# Patient Record
Sex: Male | Born: 1965 | Race: Black or African American | Hispanic: No | Marital: Single | Smoking: Former smoker
Health system: Southern US, Community
[De-identification: ages and names within clinical notes are randomized; demographics above are authoritative.]

## PROBLEM LIST (undated history)

## (undated) DIAGNOSIS — F419 Anxiety disorder, unspecified: Secondary | ICD-10-CM

## (undated) DIAGNOSIS — F32A Depression, unspecified: Secondary | ICD-10-CM

## (undated) DIAGNOSIS — H543 Unqualified visual loss, both eyes: Secondary | ICD-10-CM

## (undated) DIAGNOSIS — J42 Unspecified chronic bronchitis: Secondary | ICD-10-CM

## (undated) DIAGNOSIS — E11319 Type 2 diabetes mellitus with unspecified diabetic retinopathy without macular edema: Secondary | ICD-10-CM

## (undated) DIAGNOSIS — G43909 Migraine, unspecified, not intractable, without status migrainosus: Secondary | ICD-10-CM

## (undated) DIAGNOSIS — Z8719 Personal history of other diseases of the digestive system: Secondary | ICD-10-CM

## (undated) DIAGNOSIS — M797 Fibromyalgia: Secondary | ICD-10-CM

## (undated) DIAGNOSIS — Z9289 Personal history of other medical treatment: Secondary | ICD-10-CM

## (undated) DIAGNOSIS — J9621 Acute and chronic respiratory failure with hypoxia: Secondary | ICD-10-CM

## (undated) DIAGNOSIS — E785 Hyperlipidemia, unspecified: Secondary | ICD-10-CM

## (undated) DIAGNOSIS — R6521 Severe sepsis with septic shock: Secondary | ICD-10-CM

## (undated) DIAGNOSIS — R296 Repeated falls: Secondary | ICD-10-CM

## (undated) DIAGNOSIS — R519 Headache, unspecified: Secondary | ICD-10-CM

## (undated) DIAGNOSIS — E109 Type 1 diabetes mellitus without complications: Secondary | ICD-10-CM

## (undated) DIAGNOSIS — N289 Disorder of kidney and ureter, unspecified: Secondary | ICD-10-CM

## (undated) DIAGNOSIS — I10 Essential (primary) hypertension: Secondary | ICD-10-CM

## (undated) DIAGNOSIS — R569 Unspecified convulsions: Secondary | ICD-10-CM

## (undated) DIAGNOSIS — R51 Headache: Secondary | ICD-10-CM

## (undated) DIAGNOSIS — J45909 Unspecified asthma, uncomplicated: Secondary | ICD-10-CM

## (undated) DIAGNOSIS — G629 Polyneuropathy, unspecified: Secondary | ICD-10-CM

## (undated) DIAGNOSIS — Z8711 Personal history of peptic ulcer disease: Secondary | ICD-10-CM

## (undated) DIAGNOSIS — G9341 Metabolic encephalopathy: Secondary | ICD-10-CM

## (undated) DIAGNOSIS — N189 Chronic kidney disease, unspecified: Secondary | ICD-10-CM

## (undated) DIAGNOSIS — K219 Gastro-esophageal reflux disease without esophagitis: Secondary | ICD-10-CM

## (undated) DIAGNOSIS — D649 Anemia, unspecified: Secondary | ICD-10-CM

## (undated) DIAGNOSIS — F329 Major depressive disorder, single episode, unspecified: Secondary | ICD-10-CM

## (undated) DIAGNOSIS — I469 Cardiac arrest, cause unspecified: Secondary | ICD-10-CM

## (undated) DIAGNOSIS — N186 End stage renal disease: Secondary | ICD-10-CM

## (undated) DIAGNOSIS — I82409 Acute embolism and thrombosis of unspecified deep veins of unspecified lower extremity: Secondary | ICD-10-CM

## (undated) HISTORY — PX: NO PAST SURGERIES: SHX2092

## (undated) HISTORY — DX: Hyperlipidemia, unspecified: E78.5

## (undated) HISTORY — DX: Gastro-esophageal reflux disease without esophagitis: K21.9

---

## 2015-03-01 ENCOUNTER — Emergency Department (HOSPITAL_COMMUNITY): Payer: Self-pay

## 2015-03-01 ENCOUNTER — Emergency Department (HOSPITAL_COMMUNITY)
Admission: EM | Admit: 2015-03-01 | Discharge: 2015-03-02 | Disposition: A | Discharge status: Home / Self Care | Payer: Self-pay | Attending: Emergency Medicine | Admitting: Emergency Medicine

## 2015-03-01 ENCOUNTER — Encounter (HOSPITAL_COMMUNITY): Payer: Self-pay | Admitting: Emergency Medicine

## 2015-03-01 DIAGNOSIS — R000 Tachycardia, unspecified: Secondary | ICD-10-CM | POA: Insufficient documentation

## 2015-03-01 DIAGNOSIS — Z880 Allergy status to penicillin: Secondary | ICD-10-CM | POA: Insufficient documentation

## 2015-03-01 DIAGNOSIS — I10 Essential (primary) hypertension: Secondary | ICD-10-CM | POA: Insufficient documentation

## 2015-03-01 DIAGNOSIS — M25562 Pain in left knee: Secondary | ICD-10-CM

## 2015-03-01 DIAGNOSIS — J45909 Unspecified asthma, uncomplicated: Secondary | ICD-10-CM | POA: Insufficient documentation

## 2015-03-01 DIAGNOSIS — Y998 Other external cause status: Secondary | ICD-10-CM | POA: Insufficient documentation

## 2015-03-01 DIAGNOSIS — W010XXA Fall on same level from slipping, tripping and stumbling without subsequent striking against object, initial encounter: Secondary | ICD-10-CM | POA: Insufficient documentation

## 2015-03-01 DIAGNOSIS — E1165 Type 2 diabetes mellitus with hyperglycemia: Secondary | ICD-10-CM | POA: Insufficient documentation

## 2015-03-01 DIAGNOSIS — S299XXA Unspecified injury of thorax, initial encounter: Secondary | ICD-10-CM | POA: Insufficient documentation

## 2015-03-01 DIAGNOSIS — S8992XA Unspecified injury of left lower leg, initial encounter: Secondary | ICD-10-CM | POA: Insufficient documentation

## 2015-03-01 DIAGNOSIS — Y9259 Other trade areas as the place of occurrence of the external cause: Secondary | ICD-10-CM | POA: Insufficient documentation

## 2015-03-01 DIAGNOSIS — W19XXXA Unspecified fall, initial encounter: Secondary | ICD-10-CM

## 2015-03-01 DIAGNOSIS — Y9389 Activity, other specified: Secondary | ICD-10-CM | POA: Insufficient documentation

## 2015-03-01 DIAGNOSIS — S8991XA Unspecified injury of right lower leg, initial encounter: Secondary | ICD-10-CM | POA: Insufficient documentation

## 2015-03-01 DIAGNOSIS — M25561 Pain in right knee: Secondary | ICD-10-CM

## 2015-03-01 DIAGNOSIS — H540 Blindness, both eyes: Secondary | ICD-10-CM | POA: Insufficient documentation

## 2015-03-01 DIAGNOSIS — R739 Hyperglycemia, unspecified: Secondary | ICD-10-CM

## 2015-03-01 HISTORY — DX: Essential (primary) hypertension: I10

## 2015-03-01 HISTORY — DX: Unspecified asthma, uncomplicated: J45.909

## 2015-03-01 LAB — CBC WITH DIFFERENTIAL/PLATELET
BASOS PCT: 0 % (ref 0–1)
Basophils Absolute: 0 10*3/uL (ref 0.0–0.1)
EOS ABS: 0.1 10*3/uL (ref 0.0–0.7)
Eosinophils Relative: 1 % (ref 0–5)
HEMATOCRIT: 35.4 % — AB (ref 39.0–52.0)
HEMOGLOBIN: 12.4 g/dL — AB (ref 13.0–17.0)
Lymphocytes Relative: 39 % (ref 12–46)
Lymphs Abs: 2.8 10*3/uL (ref 0.7–4.0)
MCH: 30.5 pg (ref 26.0–34.0)
MCHC: 35 g/dL (ref 30.0–36.0)
MCV: 87 fL (ref 78.0–100.0)
MONO ABS: 0.4 10*3/uL (ref 0.1–1.0)
MONOS PCT: 5 % (ref 3–12)
Neutro Abs: 3.9 10*3/uL (ref 1.7–7.7)
Neutrophils Relative %: 55 % (ref 43–77)
PLATELETS: 376 10*3/uL (ref 150–400)
RBC: 4.07 MIL/uL — AB (ref 4.22–5.81)
RDW: 12.5 % (ref 11.5–15.5)
WBC: 7.2 10*3/uL (ref 4.0–10.5)

## 2015-03-01 LAB — URINALYSIS, ROUTINE W REFLEX MICROSCOPIC
BILIRUBIN URINE: NEGATIVE
Glucose, UA: >1000 mg/dL — AB
KETONES UR: NEGATIVE mg/dL
Leukocytes, UA: NEGATIVE
Nitrite: NEGATIVE
SPECIFIC GRAVITY, URINE: 1.025 (ref 1.005–1.030)
Urobilinogen, UA: 0.2 mg/dL (ref 0.0–1.0)
pH: 6 (ref 5.0–8.0)

## 2015-03-01 LAB — CBG MONITORING, ED
GLUCOSE-CAPILLARY: 325 mg/dL — AB (ref 70–99)
Glucose-Capillary: 420 mg/dL — ABNORMAL HIGH (ref 70–99)
Glucose-Capillary: 385 mg/dL — ABNORMAL HIGH (ref 70–99)

## 2015-03-01 LAB — COMPREHENSIVE METABOLIC PANEL
ALT: 26 U/L (ref 0–53)
ANION GAP: 8 (ref 5–15)
AST: 31 U/L (ref 0–37)
Albumin: 2.8 g/dL — ABNORMAL LOW (ref 3.5–5.2)
Alkaline Phosphatase: 103 U/L (ref 39–117)
BILIRUBIN TOTAL: 0.5 mg/dL (ref 0.3–1.2)
BUN: 11 mg/dL (ref 6–23)
CO2: 26 mmol/L (ref 19–32)
Calcium: 8.8 mg/dL (ref 8.4–10.5)
Chloride: 95 mmol/L — ABNORMAL LOW (ref 96–112)
Creatinine, Ser: 1.32 mg/dL (ref 0.50–1.35)
GFR calc Af Amer: 72 mL/min — ABNORMAL LOW (ref >90)
GFR, EST NON AFRICAN AMERICAN: 62 mL/min — AB (ref >90)
Glucose, Bld: 445 mg/dL — ABNORMAL HIGH (ref 70–99)
Potassium: 4 mmol/L (ref 3.5–5.1)
Sodium: 129 mmol/L — ABNORMAL LOW (ref 135–145)
Total Protein: 6.6 g/dL (ref 6.0–8.3)

## 2015-03-01 LAB — I-STAT TROPONIN, ED: TROPONIN I, POC: 0 ng/mL (ref 0.00–0.08)

## 2015-03-01 LAB — URINE MICROSCOPIC-ADD ON

## 2015-03-01 MED ORDER — INSULIN ASPART 100 UNIT/ML ~~LOC~~ SOLN
5.0000 [IU] | Freq: Once | SUBCUTANEOUS | Status: AC
Start: 2015-03-01 — End: 2015-03-01
  Administered 2015-03-01: 5 [IU] via INTRAVENOUS
  Filled 2015-03-01: qty 1

## 2015-03-01 MED ORDER — ACETAMINOPHEN 325 MG PO TABS
650.0000 mg | ORAL_TABLET | Freq: Once | ORAL | Status: AC
  Administered 2015-03-01: 650 mg via ORAL
  Filled 2015-03-01: qty 2

## 2015-03-01 MED ORDER — SODIUM CHLORIDE 0.9 % IV SOLN
Freq: Once | INTRAVENOUS | Status: AC
Start: 2015-03-01 — End: 2015-03-02
  Administered 2015-03-01: 23:00:00 via INTRAVENOUS

## 2015-03-01 MED ORDER — SODIUM CHLORIDE 0.9 % IV BOLUS (SEPSIS)
1000.0000 mL | Freq: Once | INTRAVENOUS | Status: AC
  Administered 2015-03-01: 1000 mL via INTRAVENOUS

## 2015-03-01 NOTE — ED Provider Notes (Signed)
CSN: [id number]     Arrival date & time 03/01/15  1846 History   First MD Initiated Contact with Patient 03/01/15 2054     Chief Complaint  Patient presents with  . Fall  . Knee Pain     (Consider location/radiation/quality/duration/timing/severity/associated sxs/prior Treatment) HPI Comments: 49 yo M hx of DM, HTN, Asthma, Diabetic retinopathy, presents with CC of fall, knee pain.  Pt states he slipped at bathroom in [employer] and fell onto both knees.  Denies head trauma or LOC.  Pt has been ambulatory since then, but c/o of bilateral knee pain L > R.  Pt also states he has been lightheaded over the last three days, and his blood sugars have been running in the 700's (baseline 300-400).  He has come [facility] on Sunday as well, but this resolved on own within minutes.  Denies fever, chills, SOB, cough, abdominal pain, nausea, vomiting, diarrhea, rash, myalgias, or any other symptoms.  Pt has been compliant with home insulin therapy.  No sick contacts.  No other concerns.   The history is provided by the patient. No language interpreter was used.    Past Medical History  Diagnosis Date  . Hypertension   . Diabetes mellitus without complication   . Asthma   . Blind    History reviewed. No pertinent past surgical history. History reviewed. No pertinent family history. History  Substance Use Topics  . Smoking status: Never Smoker   . Smokeless tobacco: Not on file  . Alcohol Use: No    Review of Systems  Constitutional: Negative for fever and chills.  Respiratory: Negative for cough and shortness of breath.   Cardiovascular: Positive for chest pain. Negative for palpitations and leg swelling.  Gastrointestinal: Negative for nausea, vomiting, abdominal pain and diarrhea.  Genitourinary: Negative for dysuria.  Musculoskeletal: Positive for arthralgias. Negative for myalgias.  Skin: Negative for rash.  Neurological: Positive for light-headedness. Negative for dizziness,  weakness, numbness and headaches.  Hematological: Negative for adenopathy. Does not bruise/bleed easily.  All other systems reviewed and are negative.     Allergies  Penicillins  Home Medications   Prior to Admission medications   Not on File   BP 176/97 mmHg  Pulse 112  Temp(Src) 99.2 F (37.3 C) (Oral)  Resp 18  SpO2 99% Physical Exam  Constitutional: He is oriented to person, place, and time. He appears well-developed and well-nourished.  HENT:  Head: Normocephalic and atraumatic.  Right Ear: External ear normal.  Left Ear: External ear normal.  Mouth/Throat: Oropharynx is clear and moist.  Eyes: Conjunctivae and EOM are normal. Pupils are equal, round, and reactive to light.  Neck: Normal range of motion. Neck supple.  Cardiovascular: Regular rhythm, normal heart sounds and intact distal pulses.   Tachycardia   Pulmonary/Chest: Effort normal and breath sounds normal. No respiratory distress. He has no wheezes. He has no rales. He exhibits no tenderness.  Abdominal: Soft. Bowel sounds are normal. He exhibits no distension and no mass. There is no tenderness. There is no rebound and no guarding.  Musculoskeletal: Normal range of motion. He exhibits tenderness.  TTP bilateral knees, L > R.  Normal passive ROM of both knees, decreased active ROM 2/2 pain.  No deformity.  No overlying lacerations, but does have some abrasions over left knee.    Neurological: He is alert and oriented to person, place, and time.  Skin: Skin is warm and dry.  Nursing note and vitals reviewed.   ED Course  Procedures (including critical care time) Labs Review Labs Reviewed  CBC WITH DIFFERENTIAL/PLATELET - Abnormal; Notable for the following:    RBC 4.07 (*)    Hemoglobin 12.4 (*)    HCT 35.4 (*)    All other components within normal limits  COMPREHENSIVE METABOLIC PANEL - Abnormal; Notable for the following:    Sodium 129 (*)    Chloride 95 (*)    Glucose, Bld 445 (*)    Albumin 2.8  (*)    GFR calc non Af Amer 62 (*)    GFR calc Af Amer 72 (*)    All other components within normal limits  CBG MONITORING, ED - Abnormal; Notable for the following:    Glucose-Capillary 420 (*)    All other components within normal limits  CBG MONITORING, ED - Abnormal; Notable for the following:    Glucose-Capillary 385 (*)    All other components within normal limits  URINALYSIS, ROUTINE W REFLEX MICROSCOPIC  I-STAT TROPOININ, ED    Imaging Review Dg Chest 2 View  03/01/2015   CLINICAL DATA:  Intermittent chest pain since 02/26/2015. Post fall in bathroom at [place] earlier today.  EXAM: CHEST  2 VIEW  COMPARISON:  None.  FINDINGS: Normal cardiac silhouette and mediastinal contours given decreased lung volumes. Evaluation of the retrosternal clear space is obscured secondary to overlying osseous and soft tissue structures. Minimal bilateral infrahilar heterogeneous opacities favored to represent atelectasis. No discrete focal airspace opacities. No pleural effusion or pneumothorax. No evidence of edema. No acute osseus abnormalities.  IMPRESSION: Hypoventilated examination without acute cardiopulmonary disease.   Electronically Signed   By: Sandi Mariscal M.D.   On: 03/01/2015 22:03   Dg Knee Complete 4 Views Left  03/01/2015   CLINICAL DATA:  Patient fell today in the bathroom. Bilateral knee pain.  EXAM: LEFT KNEE - COMPLETE 4+ VIEW  COMPARISON:  None.  FINDINGS: Mild patella [place]. No evidence of acute fracture or dislocation in the left knee. 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 No significant effusion.  IMPRESSION: No acute bony abnormalities.   Electronically Signed   By: Lucienne Capers M.D.   On: 03/01/2015 22:07   Dg Knee Complete 4 Views Right  03/01/2015   CLINICAL DATA:  Post fall in bathroom today at [place], now with bilateral knee pain.  EXAM: RIGHT KNEE - COMPLETE 4+ VIEW  COMPARISON:  None.  FINDINGS: No fracture or dislocation. There is mild degenerative change of the  knee, worse within the medial compartment and patellar femoral joints with joint space loss subchondral sclerosis and osteophytosis. No evidence of chondrocalcinosis. No joint effusion. Regional soft tissues appear normal. No radiopaque foreign body.  IMPRESSION: 1. No acute findings. 2. Mild degenerative change of the knee.   Electronically Signed   By: Sandi Mariscal M.D.   On: 03/01/2015 22:05     EKG Interpretation   Date/Time:  Wednesday March 01 2015 21:07:51 EST Ventricular Rate:  108 PR Interval:  157 QRS Duration: 84 QT Interval:  346 QTC Calculation: 464 R Axis:   13 Text Interpretation:  Sinus tachycardia Abnormal R-wave progression, late  transition ST elevation, consider anterior injury Sinus tachycardia ST-t  wave abnormality Abnormal ekg Confirmed by Carmin Muskrat  MD [phone]) on  03/01/2015 9:13:15 PM      MDM   Final diagnoses:  Hyperglycemia  Knee pain, bilateral  Fall, initial encounter   49 yo M hx of DM, HTN, Asthma, Diabetic retinopathy, presents with CC of fall,  knee pain.   Physical exam as above.  Pt tachycardic, hypertensive, afebrile, satting well on RA.   CXR was unremarkable.  istat troponin WNL.  EKG with sinus tachycardia, poor R wave progression.    Bilateral knee XR demonstrate no acute fractures.   BG 420 on CBG.    Rest of labs demonstrate no leukocytosis, mild normocytic anemia with Hgb 12.4, CMP with NA 129, glucose 445, with normal bicarb and anion gap.  No ketones in urine, > 300 protein, > 1000 glucose.  Diagnosis of hyperglycemia, without DKA.  No concern for cardiac disease at this time as pt does not have c/o of chest pain on today's visit, and troponin is 0.00.  And XRs reveal no traumatic disease; thus only minor MSK trauma to bilateral knees.   Pt given IVF, with decrease in CBG to 385.  Will given another NS bolus, and 5U insulin, and reevaluate.  Repeat CBG 325.  Pt states BG is usually 300-400.  As pt is at baseline, okay for d/c  at this time.   D/c home in good condition.  Continue insulin for DM.  Can can OTC meds for minor knee trauma.  F/u with PCP in 1 week.  Return precautions given.  Pt understands and agrees with plan.   Discussed pt with my attending Dr. Vanita Panda.   Sinda Du       Sinda Du, MD 03/02/15 [id number]  Carmin Muskrat, MD 03/03/15 [phone]

## 2015-03-01 NOTE — ED Notes (Signed)
Pt reports slipping in the bathroom at [employer] and falling onto both knees. Pt did hit head; denies LOC; reports lightheadedness. Abrasion noted to L knee with mild swelling. Pt also reports intermittent on chest pain on Sunday associated with dizziness and weakness. Pains described as sharp shooting pains that was relief with rest. Pt also c/o hyperglycemia with readings in the 700s at home. Normal range 300-400.

## 2015-03-01 NOTE — ED Notes (Signed)
Pt c/o headache; Dr.Webb aware; no new orders at this time

## 2015-03-01 NOTE — ED Notes (Signed)
Pt c/o fall today and c/o bilateral knee pain after fall with some bruising

## 2015-03-02 NOTE — Discharge Instructions (Signed)
Arthralgia Your caregiver has diagnosed you as suffering from an arthralgia. Arthralgia means there is pain in a joint. This can come from many reasons including:  Bruising the joint which causes soreness (inflammation) in the joint.  Wear and tear on the joints which occur as we grow older (osteoarthritis).  Overusing the joint.  Various forms of arthritis.  Infections of the joint. Regardless of the cause of pain in your joint, most of these different pains respond to anti-inflammatory drugs and rest. The exception to this is when a joint is infected, and these cases are treated with antibiotics, if it is a bacterial infection. HOME CARE INSTRUCTIONS   Rest the injured area for as long as directed by your caregiver. Then slowly start using the joint as directed by your caregiver and as the pain allows. Crutches as directed may be useful if the ankles, knees or hips are involved. If the knee was splinted or casted, continue use and care as directed. If an stretchy or elastic wrapping bandage has been applied today, it should be removed and re-applied every 3 to 4 hours. It should not be applied tightly, but firmly enough to keep swelling down. Watch toes and feet for swelling, bluish discoloration, coldness, numbness or excessive pain. If any of these problems (symptoms) occur, remove the ace bandage and re-apply more loosely. If these symptoms persist, contact your caregiver or return to this location.  For the first 24 hours, keep the injured extremity elevated on pillows while lying down.  Apply ice for 15-20 minutes to the sore joint every couple hours while awake for the first half day. Then 03-04 times per day for the first 48 hours. Put the ice in a plastic bag and place a towel between the bag of ice and your skin.  Wear any splinting, casting, elastic bandage applications, or slings as instructed.  Only take over-the-counter or prescription medicines for pain, discomfort, or fever as  directed by your caregiver. Do not use aspirin immediately after the injury unless instructed by your physician. Aspirin can cause increased bleeding and bruising of the tissues.  If you were given crutches, continue to use them as instructed and do not resume weight bearing on the sore joint until instructed. Persistent pain and inability to use the sore joint as directed for more than 2 to 3 days are warning signs indicating that you should see a caregiver for a follow-up visit as soon as possible. Initially, a hairline fracture (break in bone) may not be evident on X-rays. Persistent pain and swelling indicate that further evaluation, non-weight bearing or use of the joint (use of crutches or slings as instructed), or further X-rays are indicated. X-rays may sometimes not show a small fracture until a week or 10 days later. Make a follow-up appointment with your own caregiver or one to whom we have referred you. A radiologist (specialist in reading X-rays) may read your X-rays. Make sure you know how you are to obtain your X-ray results. Do not assume everything is normal if you do not hear from [place]. SEEK MEDICAL CARE IF: Bruising, swelling, or pain increases. SEEK IMMEDIATE MEDICAL CARE IF:   Your fingers or toes are numb or blue.  The pain is not responding to medications and continues to stay the same or get worse.  The pain in your joint becomes severe.  You develop a fever over 102 F (38.9 C).  It becomes impossible to move or use the joint. MAKE SURE YOU:  Understand these instructions.  Will watch your condition.  Will get help right away if you are not doing well or get worse. Document Released: 12/09/2005 Document Revised: 03/02/2012 Document Reviewed: 07/27/2008 [facility] Patient Information 2015 [place], [place]. This information is not intended to replace advice given to you by your health care provider. Make sure you discuss any questions you have with your health care  provider.  Hyperglycemia Hyperglycemia occurs when the glucose (sugar) in your blood is too high. Hyperglycemia can happen for many reasons, but it most often happens to people who do not know they have diabetes or are not managing their diabetes properly.  CAUSES  Whether you have diabetes or not, there are other causes of hyperglycemia. Hyperglycemia can occur when you have diabetes, but it can also occur in other situations that you might not be as aware of, such as: Diabetes  If you have diabetes and are having problems controlling your blood glucose, hyperglycemia could occur because of some of the following reasons:  Not following your meal plan.  Not taking your diabetes medications or not taking it properly.  Exercising less or doing less activity than you normally do.  Being sick. Pre-diabetes  This cannot be ignored. Before people develop Type 2 diabetes, they almost always have "pre-diabetes." This is when your blood glucose levels are higher than normal, but not yet high enough to be diagnosed as diabetes. Research has shown that some long-term damage to the body, especially the heart and circulatory system, may already be occurring during pre-diabetes. If you take action to manage your blood glucose when you have pre-diabetes, you may delay or prevent Type 2 diabetes from developing. Stress  If you have diabetes, you may be "diet" controlled or on oral medications or insulin to control your diabetes. However, you may find that your blood glucose is higher than usual in the hospital whether you have diabetes or not. This is often referred to as "stress hyperglycemia." Stress can elevate your blood glucose. This happens because of hormones put out by the body during times of stress. If stress has been the cause of your high blood glucose, it can be followed regularly by your caregiver. That way he/she can make sure your hyperglycemia does not continue to get worse or progress to  diabetes. Steroids  Steroids are medications that act on the infection fighting system (immune system) to block inflammation or infection. One side effect can be a rise in blood glucose. Most people can produce enough extra insulin to allow for this rise, but for those who cannot, steroids make blood glucose levels go even higher. It is not unusual for steroid treatments to "uncover" diabetes that is developing. It is not always possible to determine if the hyperglycemia will go away after the steroids are stopped. A special blood test called an A1c is sometimes done to determine if your blood glucose was elevated before the steroids were started. SYMPTOMS  Thirsty.  Frequent urination.  Dry mouth.  Blurred vision.  Tired or fatigue.  Weakness.  Sleepy.  Tingling in feet or leg. DIAGNOSIS  Diagnosis is made by monitoring blood glucose in one or all of the following ways:  A1c test. This is a chemical found in your blood.  Fingerstick blood glucose monitoring.  Laboratory results. TREATMENT  First, knowing the cause of the hyperglycemia is important before the hyperglycemia can be treated. Treatment may include, but is not be limited to:  Education.  Change or adjustment in medications.  Change or adjustment in meal plan.  Treatment for an illness, infection, etc.  More frequent blood glucose monitoring.  Change in exercise plan.  Decreasing or stopping steroids.  Lifestyle changes. HOME CARE INSTRUCTIONS   Test your blood glucose as directed.  Exercise regularly. Your caregiver will give you instructions about exercise. Pre-diabetes or diabetes which comes on with stress is helped by exercising.  Eat wholesome, balanced meals. Eat often and at regular, fixed times. Your caregiver or nutritionist will give you a meal plan to guide your sugar intake.  Being at an ideal weight is important. If needed, losing as little as 10 to 15 pounds may help improve blood  glucose levels. SEEK MEDICAL CARE IF:   You have questions about medicine, activity, or diet.  You continue to have symptoms (problems such as increased thirst, urination, or weight gain). SEEK IMMEDIATE MEDICAL CARE IF:   You are vomiting or have diarrhea.  Your breath smells fruity.  You are breathing faster or slower.  You are very sleepy or incoherent.  You have numbness, tingling, or pain in your feet or hands.  You have chest pain.  Your symptoms get worse even though you have been following your caregiver's orders.  If you have any other questions or concerns. Document Released: 06/04/2001 Document Revised: 03/02/2012 Document Reviewed: 04/06/2012 [facility] Patient Information 2015 [place], [place]. This information is not intended to replace advice given to you by your health care provider. Make sure you discuss any questions you have with your health care provider.

## 2015-10-19 ENCOUNTER — Ambulatory Visit (INDEPENDENT_AMBULATORY_CARE_PROVIDER_SITE_OTHER): Payer: Medicaid Other | Admitting: Internal Medicine

## 2015-10-19 ENCOUNTER — Ambulatory Visit (HOSPITAL_COMMUNITY)
Admission: RE | Admit: 2015-10-19 | Discharge: 2015-10-19 | Disposition: A | Discharge status: Home / Self Care | Payer: Medicaid Other | Source: Ambulatory Visit | Attending: Internal Medicine | Admitting: Internal Medicine

## 2015-10-19 ENCOUNTER — Encounter: Payer: Self-pay | Admitting: Internal Medicine

## 2015-10-19 VITALS — BP 195/118 | HR 108 | Temp 98.2°F | Ht 73.0 in | Wt 242.8 lb

## 2015-10-19 DIAGNOSIS — E1165 Type 2 diabetes mellitus with hyperglycemia: Secondary | ICD-10-CM | POA: Diagnosis not present

## 2015-10-19 DIAGNOSIS — H5412 Blindness, left eye, low vision right eye: Secondary | ICD-10-CM | POA: Diagnosis not present

## 2015-10-19 DIAGNOSIS — K219 Gastro-esophageal reflux disease without esophagitis: Secondary | ICD-10-CM | POA: Diagnosis not present

## 2015-10-19 DIAGNOSIS — E1142 Type 2 diabetes mellitus with diabetic polyneuropathy: Secondary | ICD-10-CM

## 2015-10-19 DIAGNOSIS — Z794 Long term (current) use of insulin: Secondary | ICD-10-CM

## 2015-10-19 DIAGNOSIS — E11319 Type 2 diabetes mellitus with unspecified diabetic retinopathy without macular edema: Secondary | ICD-10-CM

## 2015-10-19 DIAGNOSIS — Z8719 Personal history of other diseases of the digestive system: Secondary | ICD-10-CM | POA: Diagnosis not present

## 2015-10-19 DIAGNOSIS — R000 Tachycardia, unspecified: Secondary | ICD-10-CM | POA: Insufficient documentation

## 2015-10-19 DIAGNOSIS — Z0000 Encounter for general adult medical examination without abnormal findings: Secondary | ICD-10-CM | POA: Insufficient documentation

## 2015-10-19 DIAGNOSIS — IMO0002 Reserved for concepts with insufficient information to code with codable children: Secondary | ICD-10-CM | POA: Insufficient documentation

## 2015-10-19 DIAGNOSIS — R079 Chest pain, unspecified: Secondary | ICD-10-CM

## 2015-10-19 DIAGNOSIS — I10 Essential (primary) hypertension: Secondary | ICD-10-CM | POA: Diagnosis present

## 2015-10-19 DIAGNOSIS — Z7951 Long term (current) use of inhaled steroids: Secondary | ICD-10-CM | POA: Diagnosis not present

## 2015-10-19 DIAGNOSIS — J4540 Moderate persistent asthma, uncomplicated: Secondary | ICD-10-CM | POA: Diagnosis not present

## 2015-10-19 DIAGNOSIS — R9431 Abnormal electrocardiogram [ECG] [EKG]: Secondary | ICD-10-CM | POA: Diagnosis not present

## 2015-10-19 LAB — GLUCOSE, CAPILLARY: Glucose-Capillary: 474 mg/dL — ABNORMAL HIGH (ref 65–99)

## 2015-10-19 LAB — POCT GLYCOSYLATED HEMOGLOBIN (HGB A1C): Hemoglobin A1C: 11

## 2015-10-19 MED ORDER — ACCU-CHEK NANO SMARTVIEW W/DEVICE KIT: PACK | Status: DC

## 2015-10-19 MED ORDER — ACCU-CHEK FASTCLIX LANCETS MISC
Status: DC
Start: 2015-10-19 — End: 2016-03-14

## 2015-10-19 MED ORDER — CARVEDILOL 3.125 MG PO TABS: 3.1250 mg | ORAL_TABLET | Freq: Two times a day (BID) | ORAL | Status: DC

## 2015-10-19 MED ORDER — ATORVASTATIN CALCIUM 40 MG PO TABS: 40.0000 mg | ORAL_TABLET | Freq: Every day | ORAL | Status: DC

## 2015-10-19 MED ORDER — "INSULIN SYRINGE-NEEDLE U-100 31G X 15/64"" 0.5 ML MISC": Status: DC

## 2015-10-19 MED ORDER — BECLOMETHASONE DIPROPIONATE 80 MCG/ACT IN AERS: 2.0000 | INHALATION_SPRAY | Freq: Two times a day (BID) | RESPIRATORY_TRACT | Status: AC

## 2015-10-19 MED ORDER — PANTOPRAZOLE SODIUM 40 MG PO PACK: 40.0000 mg | PACK | Freq: Every day | ORAL | Status: DC

## 2015-10-19 MED ORDER — GABAPENTIN 100 MG PO CAPS: 100.0000 mg | ORAL_CAPSULE | Freq: Three times a day (TID) | ORAL | Status: DC

## 2015-10-19 MED ORDER — INSULIN LISPRO 100 UNIT/ML CARTRIDGE: 5.0000 [IU] | Freq: Three times a day (TID) | SUBCUTANEOUS | Status: DC

## 2015-10-19 MED ORDER — GLUCOSE BLOOD VI STRP: ORAL_STRIP | Status: DC

## 2015-10-19 MED ORDER — INSULIN GLARGINE 100 UNIT/ML ~~LOC~~ SOLN: 30.0000 [IU] | Freq: Every day | SUBCUTANEOUS | Status: DC

## 2015-10-19 MED ORDER — LISINOPRIL 20 MG PO TABS: 20.0000 mg | ORAL_TABLET | Freq: Every day | ORAL | Status: DC

## 2015-10-19 NOTE — Assessment & Plan Note (Addendum)
His chest pain is worrisome for ischemic chest pain. He has multiple risk factors including uncontrolled HTN and uncontrolled DM. He likely has hyperlipidemia as well. An EKG was checked in office today and showed T wave inversions in I, II, avL, and V4-V6 which is new compared to his EKG in March 2016. He is currently not having active chest pain. He will likely need a Myoview and cardiology referral in the near future. - Continue ASA 81 mg daily - Start Atorvastatin 40 mg daily. Check lipid panel. - Start Coreg 3.125 mg BID, titrate up as tolerated - Start Lisinopril 20 mg daily - Instructed to go to ED if chest pain worsens or becomes prolonged - Would recommend cardiology referral at next visit

## 2015-10-19 NOTE — Assessment & Plan Note (Addendum)
BP Readings from Last 3 Encounters:  10/19/15 195/118  03/02/15 179/112    Lab Results  Component Value Date   NA 129* 03/01/2015   K 4.0 03/01/2015   CREATININE 1.32 03/01/2015    Assessment: Blood pressure control:  BP significantly elevated today. Currently not on medications.  Plan: Medications:  Start Lisinopril 20 mg daily and Coreg 3.125 mg BID.  Other plans:  - BP recheck in 1 week - Check CMET

## 2015-10-19 NOTE — Progress Notes (Signed)
   Subjective:    Patient ID: Jeremy Yu, male    DOB: March 29, 1966, 49 y.o.   MRN: [id number]  HPI Mr. 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  HTN: BP significantly elevated at 199/119 with HR 108. On repeat check his BP was 195/118. He reports at the free clinic he used to go to he was supposed to be on medication but has not been for last several months. He does not remember the names of the medications he was previously prescribed.  Type 2 DM: Last HbA1c unknown, today was 11.0. He reports his insulin regimen includes Novolog 70/30 15 units TID with meals and Lantus 10 units BID. He did not bring a glucometer as his is broken. He reports having peripheral neuropathy in his legs and hands. He notes significant burning pain. He is not on medication for his neuropathy. He has a history of retinopathy with loss of vision in his left eye and decreased vision on the right. He reports he has an appointment with ophthalmology next month.   "Heart Flutter": Patient reports for the last few months he has experienced a "heart flutter." He describes the sensation occurring mostly at night daily and occasionally during the day if he becomes emotionally upset/stressed. He describes associated chest pain that will last about 15 minutes before resolving. He describes the chest pain as sharp and pressure-like, located on the left side and sometimes radiates to the middle and right side, and worse upon exertion such as walking. He states the pain will get mildly better at night if he rubs his chest with his hand. He reports associated dyspnea, nausea/vomiting, and diaphoresis when the pain occurs. He reports he started to take a baby aspirin recently.   Asthma: Patient reports having to use his albuterol inhaler daily due to symptoms of chest tightness, shortness of breath, and wheezing. He  reports nocturnal symptoms sometimes. He has not had any hospitalizations for acute asthma exacerbations in many years.   GERD: He reports burning chest pain after eating. He has been avoiding spicy foods. He is currently not on any medications for his reflux.   Melena: Patient reports about 3 months ago he had 3-4 episodes of black tarry stools. He denies any additional episodes since then. He denies seeking medical attention. He has never had a colonoscopy. He denies excessive NSAID use or alcohol use.    Review of Systems General: Denies fever, chills, night sweats, changes in weight, changes in appetite HEENT: Reports headaches occasionally- has hx of migraines when younger. Denies ear pain, changes in vision, rhinorrhea, sore throat CV: See HPI Pulm: See HPI GI: Reports constipation. Denies diarrhea. GU: Denies dysuria, hematuria, frequency Msk: Denies muscle cramps, joint pains Neuro: Denies weakness Skin: Denies rashes, bruising Psych: Denies depression, anxiety, hallucinations    Objective:   Physical Exam General: alert, sitting up, NAD HEENT: [place]/AT, blind in L eye, sclera anicteric, pharynx non-erythematous, mucus membranes moist CV: RRR, no m/g/r Pulm: CTA bilaterally, breaths non-labored Abd: BS+, soft, obese, non-tender Ext: warm, trace-1+ pitting edema bilaterally Neuro: alert and oriented x 3. Strength 5/5 in upper and lower extremities. Decreased sensation to pinprick in lower extremities half-way up to knees.     Assessment & Plan:  Please refer to A&P documentation.

## 2015-10-19 NOTE — Assessment & Plan Note (Addendum)
Last episode of melena 3-4 months ago. Gave patient stool cards to take home and bring back to his next appointment. Instructions given. - f/u stool cards - Check CBC

## 2015-10-19 NOTE — Assessment & Plan Note (Addendum)
Symptoms most consistent with moderate persistent asthma. No PFTs in system. He is only on Albuterol PRN. Will start an inhaled glucocorticoid. - Start Qvar 2 puffs BID - Continue Albuterol PRN - Consider PFTs in future

## 2015-10-19 NOTE — Assessment & Plan Note (Signed)
-   Start Protonix 40 mg daily  - Evaluate symptoms at next visit

## 2015-10-19 NOTE — Assessment & Plan Note (Signed)
-   Check HIV antibody - Discuss vaccines next visit

## 2015-10-19 NOTE — Patient Instructions (Addendum)
-   Change insulin regimen to: Lantus 30 units at bedtime Humalog 5 units three times daily with meals  - Start Gabapentin 100 mg three times daily for your peripheral neuropathy  - Start Lisinopril 20 mg daily for blood pressure  - Start Protonix 40 mg daily for acid reflux  - Start Coreg 3.125 mg twice daily and Atorvastatin 40 mg daily for your heart. Continue aspirin 81 mg daily.  - Start using Qvar inhaler 2 puffs twice daily  - I have ordered a glucometer for you- Accu chek with test strips, lancets, and syringes.  - Will have you meet with Butch Penny, our diabetes educator and nutritionist  - Follow up in 2 weeks  General Instructions:   Please bring your medicines with you each time you come to clinic.  Medicines may include prescription medications, over-the-counter medications, herbal remedies, eye drops, vitamins, or other pills.   Progress Toward Treatment Goals:  No flowsheet data found.  Self Care Goals & Plans:  Self Care Goal 10/19/2015  Manage my medications take my medicines as prescribed; bring my medications to every visit; refill my medications on time  Monitor my health keep track of my blood glucose; bring my glucose meter and log to each visit; keep track of my blood pressure; check my feet daily  Eat healthy foods eat more vegetables; eat foods that are low in salt; eat baked foods instead of fried foods; eat smaller portions  Be physically active find an activity I enjoy  Prevent falls wear appropriate shoes; have my vision checked    No flowsheet data found.   Care Management & Community Referrals:  No flowsheet data found.

## 2015-10-19 NOTE — Assessment & Plan Note (Signed)
Lab Results  Component Value Date   HGBA1C 11.0 10/19/2015     Assessment: Diabetes control:  Uncontrolled. Unknown previous HbA1c. 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 Additionally, he was on an inappropriate insulin regimen. Will adjust his insulin regimen and have him follow up soon.    Plan: Medications:  Stop 70/30 completely. Change Lantus to 30 units QHS. Start Humalog 5 units TID with meals. Will likely need to be increased.  Home glucose monitoring: Frequency:  3-4 times daily  Timing:  Meal times and bedtime Instruction/counseling given: reminded to get eye exam, reminded to bring blood glucose meter & log to each visit and reminded to bring medications to each visit Other plans:  - Follow up in 1 week to adjust insulin regimen - Given Rxs for Accu chek glucometer, test strips, and lancets. Instructed to test 3-4 times daily and to bring meter to next visit. - Given Rx for Lantus and Humalog. - Check urine microalbumin today - Start Lisinopril- likely has nephropathy - Referral to Diabetes/Nutrition education - Start Atorvastatin 40 mg daily (high intensity statin)

## 2015-10-20 ENCOUNTER — Telehealth: Payer: Self-pay | Admitting: Pulmonary Disease

## 2015-10-20 ENCOUNTER — Other Ambulatory Visit: Payer: Self-pay | Admitting: Internal Medicine

## 2015-10-20 ENCOUNTER — Telehealth: Payer: Self-pay

## 2015-10-20 DIAGNOSIS — K219 Gastro-esophageal reflux disease without esophagitis: Secondary | ICD-10-CM

## 2015-10-20 LAB — MICROALBUMIN / CREATININE URINE RATIO
Creatinine, Urine: 49.7 mg/dL
MICROALB/CREAT RATIO: 7526.4 mg/g creat — ABNORMAL HIGH (ref 0.0–30.0)
Microalbumin, Urine: 3740.6 ug/mL

## 2015-10-20 LAB — CMP14 + ANION GAP
A/G RATIO: 1.1 (ref 1.1–2.5)
ALBUMIN: 3.4 g/dL — AB (ref 3.5–5.5)
ALK PHOS: 119 IU/L — AB (ref 39–117)
ALT: 11 IU/L (ref 0–44)
AST: 16 IU/L (ref 0–40)
Anion Gap: 17 mmol/L (ref 10.0–18.0)
BILIRUBIN TOTAL: 0.4 mg/dL (ref 0.0–1.2)
BUN / CREAT RATIO: 9 (ref 9–20)
BUN: 16 mg/dL (ref 6–24)
CHLORIDE: 95 mmol/L — AB (ref 97–106)
CO2: 23 mmol/L (ref 18–29)
Calcium: 9 mg/dL (ref 8.7–10.2)
Creatinine, Ser: 1.77 mg/dL — ABNORMAL HIGH (ref 0.76–1.27)
GFR calc Af Amer: 51 mL/min/{1.73_m2} — ABNORMAL LOW (ref >59)
GFR calc non Af Amer: 44 mL/min/{1.73_m2} — ABNORMAL LOW (ref >59)
GLOBULIN, TOTAL: 3 g/dL (ref 1.5–4.5)
Glucose: 429 mg/dL — ABNORMAL HIGH (ref 65–99)
POTASSIUM: 4.1 mmol/L (ref 3.5–5.2)
SODIUM: 135 mmol/L — AB (ref 136–144)
Total Protein: 6.4 g/dL (ref 6.0–8.5)

## 2015-10-20 LAB — LIPID PANEL
CHOLESTEROL TOTAL: 484 mg/dL — AB (ref 100–199)
Chol/HDL Ratio: 8.3 ratio units — ABNORMAL HIGH (ref 0.0–5.0)
HDL: 58 mg/dL (ref >39)
TRIGLYCERIDES: 986 mg/dL — AB (ref 0–149)

## 2015-10-20 LAB — CBC WITH DIFFERENTIAL/PLATELET
BASOS: 0 %
Basophils Absolute: 0 10*3/uL (ref 0.0–0.2)
EOS (ABSOLUTE): 0.1 10*3/uL (ref 0.0–0.4)
EOS: 1 %
HEMOGLOBIN: 11.2 g/dL — AB (ref 12.6–17.7)
Hematocrit: 35.1 % — ABNORMAL LOW (ref 37.5–51.0)
IMMATURE GRANS (ABS): 0 10*3/uL (ref 0.0–0.1)
IMMATURE GRANULOCYTES: 0 %
LYMPHS: 38 %
Lymphocytes Absolute: 2.8 10*3/uL (ref 0.7–3.1)
MCH: 29.5 pg (ref 26.6–33.0)
MCHC: 31.9 g/dL (ref 31.5–35.7)
MCV: 92 fL (ref 79–97)
MONOCYTES: 6 %
Monocytes Absolute: 0.4 10*3/uL (ref 0.1–0.9)
NEUTROS PCT: 55 %
Neutrophils Absolute: 3.9 10*3/uL (ref 1.4–7.0)
PLATELETS: 406 10*3/uL — AB (ref 150–379)
RBC: 3.8 x10E6/uL — ABNORMAL LOW (ref 4.14–5.80)
RDW: 13.1 % (ref 12.3–15.4)
WBC: 7.2 10*3/uL (ref 3.4–10.8)

## 2015-10-20 LAB — HIV ANTIBODY (ROUTINE TESTING W REFLEX): HIV Screen 4th Generation wRfx: NONREACTIVE

## 2015-10-20 MED ORDER — PANTOPRAZOLE SODIUM 40 MG PO TBEC: 40.0000 mg | DELAYED_RELEASE_TABLET | Freq: Every day | ORAL | Status: DC

## 2015-10-20 NOTE — Telephone Encounter (Signed)
INTERNAL MEDICINE RESIDENCY PROGRAM After-Hours Telephone Call    Reason for call:   I received a call from [name] on 10/20/2015 at 1:04 AM indicating he was having trouble with getting his new meter to work.    Pertinent Data:   Denies symptomatic hypoglycemia - no diaphoresis or tremulousness. Denies polyuria or polydipsia.    Assessment / Plan / Recommendations:   Will send message to front desk and Debera Lat, CDE to call him tomorrow to troubleshoot meter.  As always, pt is advised that if symptoms worsen or new symptoms arise, they should go to an urgent care facility or to to ER for further evaluation.    Milagros Loll, MD   10/20/2015, 1:04 AM

## 2015-10-20 NOTE — Telephone Encounter (Signed)
Message left on home phone ID recording - D. Plyler is not in clinic 10/20/15 - just wanted pt to be aware.

## 2015-10-20 NOTE — Progress Notes (Signed)
Sorry put in the wrong order! Changed to tablet form.

## 2015-10-20 NOTE — Telephone Encounter (Signed)
Pharmacy requesting update to protonix rx.  Per pharmacy it needs to be a tablet or powder instead of liquid, and pt told pharmacy he has no feeding tube and can tolerate the tablet form.

## 2015-10-20 NOTE — Progress Notes (Signed)
Medicine attending: Medical history, presenting problems, physical findings, and medications, reviewed with Dr Carly Rivet on the day of the patient visit and I concur with her evaluation and management plan. 

## 2015-10-23 ENCOUNTER — Telehealth: Payer: Self-pay | Admitting: *Deleted

## 2015-10-23 NOTE — Telephone Encounter (Signed)
His aide got his meter working. He says the last result he got was a blood sugar of 329 yesterday. He has not checked it today yet.  We discussed that anytime it is over 200 mg/dl that he should think about why his blood sugar is high and if he knows that he forgot his insulin, exercised less or ate too much that he can fix those things. However, if it is high on several readings and he doesn't know why he should clal the office. He says "it's always high" . Noted we have an appointment this Thursday, he agrees to bring his meter and we can discuss further at that time.

## 2015-10-23 NOTE — Telephone Encounter (Signed)
Filled today, need PA

## 2015-10-25 NOTE — Telephone Encounter (Signed)
Called made to [facility] after receiving PA request from triage.  Protonix is the preferred PPI for medicaid, pharmacy stated that patient had already picked up rx and no PA was required. Attempted to contact pt to confirm, no answer, no message left.  Will f/u with pt during his visit tomorrow in [facility], phone call complete.Despina Hidden Cassady11/2/201612:25 PM

## 2015-10-26 ENCOUNTER — Encounter: Payer: Medicaid Other | Admitting: Dietician

## 2015-10-26 ENCOUNTER — Ambulatory Visit: Payer: Medicaid Other | Admitting: Internal Medicine

## 2015-10-31 ENCOUNTER — Other Ambulatory Visit (INDEPENDENT_AMBULATORY_CARE_PROVIDER_SITE_OTHER): Payer: Medicaid Other

## 2015-10-31 DIAGNOSIS — Z8719 Personal history of other diseases of the digestive system: Secondary | ICD-10-CM

## 2015-10-31 LAB — POC HEMOCCULT BLD/STL (HOME/3-CARD/SCREEN)
Card #2 Fecal Occult Blod, POC: NEGATIVE
Card #3 Fecal Occult Blood, POC: NEGATIVE
Fecal Occult Blood, POC: NEGATIVE

## 2015-11-02 ENCOUNTER — Encounter: Payer: Self-pay | Admitting: Dietician

## 2015-11-02 ENCOUNTER — Ambulatory Visit (INDEPENDENT_AMBULATORY_CARE_PROVIDER_SITE_OTHER): Payer: Medicaid Other | Admitting: Dietician

## 2015-11-02 ENCOUNTER — Ambulatory Visit (INDEPENDENT_AMBULATORY_CARE_PROVIDER_SITE_OTHER): Payer: Medicaid Other | Admitting: Internal Medicine

## 2015-11-02 ENCOUNTER — Encounter: Payer: Self-pay | Admitting: Internal Medicine

## 2015-11-02 VITALS — BP 207/111 | HR 94 | Temp 97.8°F | Ht 73.0 in | Wt 246.0 lb

## 2015-11-02 DIAGNOSIS — Z0000 Encounter for general adult medical examination without abnormal findings: Secondary | ICD-10-CM

## 2015-11-02 DIAGNOSIS — E11319 Type 2 diabetes mellitus with unspecified diabetic retinopathy without macular edema: Secondary | ICD-10-CM | POA: Diagnosis not present

## 2015-11-02 DIAGNOSIS — Z8719 Personal history of other diseases of the digestive system: Secondary | ICD-10-CM | POA: Diagnosis not present

## 2015-11-02 DIAGNOSIS — I10 Essential (primary) hypertension: Secondary | ICD-10-CM | POA: Diagnosis not present

## 2015-11-02 DIAGNOSIS — R079 Chest pain, unspecified: Secondary | ICD-10-CM | POA: Diagnosis not present

## 2015-11-02 DIAGNOSIS — Z794 Long term (current) use of insulin: Secondary | ICD-10-CM

## 2015-11-02 DIAGNOSIS — E1065 Type 1 diabetes mellitus with hyperglycemia: Secondary | ICD-10-CM | POA: Diagnosis not present

## 2015-11-02 DIAGNOSIS — E1165 Type 2 diabetes mellitus with hyperglycemia: Secondary | ICD-10-CM | POA: Diagnosis not present

## 2015-11-02 DIAGNOSIS — Z713 Dietary counseling and surveillance: Secondary | ICD-10-CM

## 2015-11-02 MED ORDER — INSULIN ASPART 100 UNIT/ML FLEXPEN: 6.0000 [IU] | PEN_INJECTOR | Freq: Three times a day (TID) | SUBCUTANEOUS | Status: DC

## 2015-11-02 MED ORDER — INSULIN PEN NEEDLE 31G X 5 MM MISC: Status: DC

## 2015-11-02 MED ORDER — CARVEDILOL 6.25 MG PO TABS: 6.2500 mg | ORAL_TABLET | Freq: Two times a day (BID) | ORAL | Status: DC

## 2015-11-02 MED ORDER — GABAPENTIN 300 MG PO CAPS: 300.0000 mg | ORAL_CAPSULE | Freq: Three times a day (TID) | ORAL | Status: DC

## 2015-11-02 MED ORDER — INSULIN GLARGINE 100 UNIT/ML SOLOSTAR PEN: 30.0000 [IU] | PEN_INJECTOR | Freq: Every day | SUBCUTANEOUS | Status: DC

## 2015-11-02 MED ORDER — HYDROCHLOROTHIAZIDE 25 MG PO TABS: 25.0000 mg | ORAL_TABLET | Freq: Every day | ORAL | Status: DC

## 2015-11-02 NOTE — Assessment & Plan Note (Signed)
HIV was negative at last visit. -Flu shot next visit

## 2015-11-02 NOTE — Assessment & Plan Note (Signed)
Lab Results  Component Value Date   HGBA1C 11.0 10/19/2015     Assessment: Diabetes control:  Uncontrolled. Patient was off insulin for 'about a year', with several recently hospitalizations including a 2 week stint in the ICU for likely HHS Comments: Was only able to obtain lantus 30u qhs and is compliant. Not on any basal 2/2 humalog not being covered. Pt saw Butch Penny today as well for further education.  Plan: Medications:  Continue lantus solostar 30u qhs, start novolog flexpen 6u TID w meals. Home glucose monitoring: Frequency:  3-4 times daily Timing:  meals and bedtime Instruction/counseling given: reminded to get eye exam, reminded to bring blood glucose meter & log to each visit, reminded to bring medications to each visit, discussed foot care and discussed diet Educational resources provided:  Met with Butch Penny today Other plans:   -Will RTC in 2 weeks for adjustment of insulin regimen  -Continue lisinopril and atorvastatin  -Increased gabapentin from 113m TID to 304mTID for severe diabetic neuropathy  -Continued DM/Nutrition education, foot care  -F/u recs from eye appt next week

## 2015-11-02 NOTE — Assessment & Plan Note (Signed)
Chest pain is slightly improved since last visit, but still with history and EKG changes as well as PND and leg swelling is highly suggestive of ischemic-related heart disease. In the setting of very high BPs and sugars, this can be much worse. He is currently not having active chest pain -Increased coreg to 6.$RemoveBe'5mg'BKMKPgREz$  BID, will continue to titrate up as tolerated -Continue lisinopril, statin, ASA -Will consider cardiology referral soon with improved BP and glycemic control

## 2015-11-02 NOTE — Progress Notes (Signed)
Has not been taking the humalog medicaid denied it.  Misses lantus 1/7 days a week Bad days he won't eat- if he eats it makes him feel sick Bowels moving terribly- 4 days with no movement  Desires readings in 80-150  476 at 12 midnight then he took his night time insulin 370 this am,  Lowest since he got his meter was 279, highest is 500s. Did not bring meter with him today Tearful when discussing burden of diabetes. He reports memory problems. Has aid 7 days a week who also has diabetes and was in the waiting room today. CDE spoke to her and asked her to check his feet daily, assist him with meal planning.   He was diagnosed with Type 1  diabetes in his late 74s ~ 69.  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 He is currently going to school for the blind. He has been blind for 2 years His previous diabetes regimen was  - 10 novolin 70/30 3 times a day, lantus 15 units twice a day, another insulin to take when sugar was high and a  Pill before he eats  No symptoms if CBG < 700, no problems with low blood sugar Felt bad when >750 has been up to 1800 Doctor was in [place]- Dr. Elliot Gault because he doesn't eat carbs, bread, subs, little fried foods and diabetes is still not well controlled  Walks a lot.   24-hr recall suggests intake of 1800-2200 kcal:  (Up at 10  AM) B (  1130 AM)- used to skip, eats it now because his medicine calls for food- mainly eats a bowl of regular grits-3x/week, eggs or honeycomb cereal 1x/week, coffee and toast  x1 time    L (  3 PM)-skips if he eats breakfast, but will have diet soda and saltine wheat no salt crackers , sandwich x 2 times/week- subway cold cut 6" lettuce tomato, onions peppers, light vinegar oil light honey mustard.    Supper ( 7 PM)- potato- creamed instant, chicken- baked, and corn, not much pasta, loves chicken alfredo- none in 2-3 months buys small servings frozen, chinese food  2x/week- broccoli and chicken and sometimes egg rolls, steamed rice, eats 1/3 the box cup  Snk ( PM)- mini bag of hot fries or not even a whole small bag of chips Typical day? Yes.    Eats apples, grapes 10-15, bananas, lite fruit - 3 times a day Vegetables- daily Whole grains- crackers, sometimes rice, Diabetes Self-Management Education  Visit Type: First/Initial  Appt. Start Time: 0955 Appt. End Time: 9476  11/02/2015  Jeremy Yu, identified by name and date of birth, is a 49 y.o. male with a diagnosis of Diabetes: Type 1.   ASSESSMENT  There were no vitals taken for this visit. There is no weight on file to calculate BMI.      Diabetes Self-Management Education - 11/02/15 1600    Visit Information   Visit Type First/Initial   Initial Visit   Diabetes Type Type 1   Are you currently following a meal plan? Yes   What type of meal plan do you follow? --  tries to limit carbs and fried food   Are you taking your medications as prescribed? Yes   Date Diagnosed --  [place]   How would you rate your overall health? Fair   Psychosocial Assessment   Patient Belief/Attitude about Diabetes Defeat/Burnout  Self-care barriers Impaired vision;Lack of transportation;Lack of material resources   Self-management support Doctor's office;CDE visits   Patient Concerns Glycemic Control;Support   Special Needs Large print;Verbal instruction   Preferred Learning Style Auditory;Visual;Hands on   Learning Readiness Ready   How often do you need to have someone help you when you read instructions, pamphlets, or other written materials from your doctor or pharmacy? 4 - Often   Complications   Last HgB A1C per patient/outside source 11 %   How often do you check your blood sugar? 3-4 times / week   Fasting Blood glucose range (mg/dL) >200   Postprandial Blood glucose range (mg/dL) >200   Number of hypoglycemic episodes per month 0   Number of hyperglycemic episodes  per week 100   Can you tell when your blood sugar is high? No   Have you had a dilated eye exam in the past 12 months? No   Are you checking your feet? No   Exercise   Exercise Type Light (walking / raking leaves)   How many days per week to you exercise? --  5   Patient Education   Previous Diabetes Education Yes (please comment)   Nutrition management  Carbohydrate counting;Meal timing in regards to the patients' current diabetes medication.   Medications Reviewed patients medication for diabetes, action, purpose, timing of dose and side effects.   Monitoring Purpose and frequency of SMBG.;Daily foot exams   Individualized Goals (developed by patient)   Nutrition Follow meal plan discussed   Monitoring  Other (comment)   Outcomes   Expected Outcomes Demonstrated interest in learning. Expect positive outcomes   Future DMSE 2 wks   Program Status Not Completed      Individualized Plan for Diabetes Self-Management Training:   Learning Objective:  Patient will have a greater understanding of diabetes self-management. Patient education plan is : meal planning, acute complications, medication   Plan:   Patient Instructions  You are doing great taking care of your diabetes.  We just need to match your fast acting insulin to your food.   Call Jeremy Yu when any concerns about your diabetes.  [phone]  Carbs per day: 17-19  6 servings of carb at breakfast, lunch and supper  Meal plan:  Breakfast: 6 servings of carb Lunch: 6 servings of carb Supper: 6 servings of carb  1(one)Carb choice is equal to: 1/3 cup rice, 1 slice bread, 1/2 cup potato, 5 crackers, 1/2 cup fruit, 3/4 cup cold cereal, 1/2 grits, 1 cup milk,   Fast acting insulin is:______________  Long acting insulin is: Lantus   Expected Outcomes:  Demonstrated interest in learning. Expect positive outcomes Education material provided: Carbohydrate counting sheet If problems or questions, patient to contact team  via:  Phone Future DSME appointment: 2 wks

## 2015-11-02 NOTE — Progress Notes (Signed)
   Patient ID: Jeremy Yu male   DOB: 10/08/1966 49 y.o.   MRN: [id number]  Subjective:   HPI: [name] is a 49 y.o. 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  Since he was seen almost 2 weeks ago, he states he has been compliant with his medications with the assistance of his live-in home health aid. He was unable to get the humalog filled because it wasn't covered, but otherwise demonstrates proper use of the lantus. He says his BP has still been near 037 systolic on the lisinopril $RemoveBefore'20mg'djOYOdKpXDnlw$ . His sugars have been in the 300s and 400s mostly. He says that his peripheral neuropathy has been very painful recently, which he describes as severe burning pain worse at night, R worse than L LE. He also has decreased sensation in both his hands. He says the chest pain, shortness of breath and palpitations he has been having are roughly the same in severity, perhaps slightly improved since starting the coreg. Otherwise, he feels well, and is very motivated to get his sugars and BP under control and is in very good spirits.  Please see problem-based charting for status of medical issues pertinent to this visit.  Review of Systems: Pertinent items noted in HPI and remainder of comprehensive ROS otherwise negative.  Objective:  Physical Exam: Filed Vitals:   11/02/15 1056  BP: 207/111  Pulse: 94  Temp: 97.8 F (36.6 C)  TempSrc: Oral  Height: $Remove'6\' 1"'niMeHSZ$  (1.854 m)  Weight: 246 lb (111.585 kg)  SpO2: 100%   Gen: Well-appearing, alert and oriented to person, place, and time HEENT: Oropharynx clear without erythema or exudate.  Neck: No cervical LAD, no thyromegaly or nodules, no JVD noted. CV: Normal rate, regular rhythm, no murmurs or rubs. Pulmonary: Normal effort, CTA bilaterally, no wheezing, rales, or rhonchi Abdominal:  Soft, non-tender, non-distended, without rebound, guarding, or masses Extremities: Distal pulses 2+ in upper and lower extremities bilaterally, no tenderness or erythema. There is b/l LE edema 2+ to mid calf. No lesions seen on the feet. Skin: No atypical appearing moles. Small abrasion on R shin from past fall. Otherwise no rashes/lesions found. Neuro: Decreased sensation to fine touch in bilateral lower extremities up to mid calf. Decreased sensation to fine touch in all fingers bilateraly. Normal strength. No focal weakness.  Assessment & Plan:  Please see problem-based charting for assessment and plan.  Blane Ohara, MD Resident Physician, PGY-1 Department of Internal Medicine [facility]

## 2015-11-02 NOTE — Patient Instructions (Signed)
Mr. Thoma,  Please check your blood pressures at home and put them in the log. We have added another blood pressure medicine to your regimen. We have also increased the doses of your heart pill (carvedilil) and nerve pain pill (gabapentin), so stop taking the old lower dose pills and use the ones we sent to the pharmacy. Follow Donna's instructions. Let us know if you have any other issues before we see you in 2 weeks.  Thanks, Blane Ohara

## 2015-11-02 NOTE — Assessment & Plan Note (Signed)
BP Readings from Last 3 Encounters:  11/02/15 207/111  10/19/15 195/118  03/02/15 179/112    Lab Results  Component Value Date   NA 135* 10/19/2015   K 4.1 10/19/2015   CREATININE 1.77* 10/19/2015    Assessment: Blood pressure control:  BP significantly elevated today, just started on lisinopril 20mg  2 weeks ago, beforehand was on no medications Progress toward BP goal:   Still ~532D/924 systolic on lisinopril monotherapy Comments: On lisinopril 20mg   Plan: Medications:  Continue lisinopril 20mg , start HCTZ 25mg  QD Educational resources provided:   Self management tools provided:   Other plans: Will recheck CMET at next visit in 2 weeks

## 2015-11-02 NOTE — Assessment & Plan Note (Addendum)
No recurrent episodes of melena since ~4 months ago. Stool cards x 3 negative at last visit. Has a mild normocytic anemia. Currently asymptomatic. -Will obtain iron labs + retic at next visit to help determine cause.

## 2015-11-02 NOTE — Patient Instructions (Addendum)
You are doing great taking care of your diabetes.  We just need to match your fast acting insulin to your food.   Call Butch Penny when any concerns about your diabetes.  [phone]  Carbs per day: 17-19  6 servings of carb at breakfast, lunch and supper  Meal plan:  Breakfast: 6 servings of carb Lunch: 6 servings of carb Supper: 6 servings of carb  1(one)Carb choice is equal to: 1/3 cup rice, 1 slice bread, 1/2 cup potato, 5 crackers, 1/2 cup fruit, 3/4 cup cold cereal, 1/2 grits, 1 cup milk,   Fast acting insulin is:______________  Long acting insulin is: Lantus

## 2015-11-02 NOTE — Progress Notes (Signed)
Internal Medicine Clinic Attending  I saw and evaluated the patient.  I personally confirmed the key portions of the history and exam documented by Dr. Merrilyn Puma and I reviewed pertinent patient test results.  The assessment, diagnosis, and plan were formulated together and I agree with the documentation in the resident's note.  Likely a Type 1 diabetic with a history of frequent hospitalizations for DKA re-established with our clinic with uncontrolled glucose control and uncontrolled hypertension. We plan to add HCTZ to lisinopril and lower blood pressure gradually over the next few weeks. He is doing fairly well on insulin regimen, we referred to endocrinology for consideration of insulin pump.

## 2016-03-14 ENCOUNTER — Encounter: Payer: Self-pay | Admitting: Internal Medicine

## 2016-03-14 ENCOUNTER — Ambulatory Visit (INDEPENDENT_AMBULATORY_CARE_PROVIDER_SITE_OTHER): Payer: Medicaid Other | Admitting: Internal Medicine

## 2016-03-14 VITALS — BP 178/101 | HR 108 | Temp 98.3°F | Ht 73.0 in | Wt 274.0 lb

## 2016-03-14 DIAGNOSIS — E1142 Type 2 diabetes mellitus with diabetic polyneuropathy: Secondary | ICD-10-CM

## 2016-03-14 DIAGNOSIS — N189 Chronic kidney disease, unspecified: Secondary | ICD-10-CM

## 2016-03-14 DIAGNOSIS — E1165 Type 2 diabetes mellitus with hyperglycemia: Secondary | ICD-10-CM

## 2016-03-14 DIAGNOSIS — I10 Essential (primary) hypertension: Secondary | ICD-10-CM

## 2016-03-14 DIAGNOSIS — E1122 Type 2 diabetes mellitus with diabetic chronic kidney disease: Secondary | ICD-10-CM

## 2016-03-14 DIAGNOSIS — E11319 Type 2 diabetes mellitus with unspecified diabetic retinopathy without macular edema: Secondary | ICD-10-CM | POA: Diagnosis not present

## 2016-03-14 DIAGNOSIS — I129 Hypertensive chronic kidney disease with stage 1 through stage 4 chronic kidney disease, or unspecified chronic kidney disease: Secondary | ICD-10-CM

## 2016-03-14 DIAGNOSIS — Z7982 Long term (current) use of aspirin: Secondary | ICD-10-CM | POA: Diagnosis not present

## 2016-03-14 DIAGNOSIS — Z794 Long term (current) use of insulin: Secondary | ICD-10-CM

## 2016-03-14 DIAGNOSIS — Z79899 Other long term (current) drug therapy: Secondary | ICD-10-CM

## 2016-03-14 DIAGNOSIS — D649 Anemia, unspecified: Secondary | ICD-10-CM | POA: Diagnosis not present

## 2016-03-14 DIAGNOSIS — I209 Angina pectoris, unspecified: Secondary | ICD-10-CM | POA: Diagnosis not present

## 2016-03-14 DIAGNOSIS — R079 Chest pain, unspecified: Secondary | ICD-10-CM

## 2016-03-14 LAB — CBC
HEMATOCRIT: 25.1 % — AB (ref 39.0–52.0)
HEMOGLOBIN: 8.4 g/dL — AB (ref 13.0–17.0)
MCH: 30.8 pg (ref 26.0–34.0)
MCHC: 33.5 g/dL (ref 30.0–36.0)
MCV: 91.9 fL (ref 78.0–100.0)
Platelets: 329 10*3/uL (ref 150–400)
RBC: 2.73 MIL/uL — ABNORMAL LOW (ref 4.22–5.81)
RDW: 13.3 % (ref 11.5–15.5)
WBC: 4.4 10*3/uL (ref 4.0–10.5)

## 2016-03-14 LAB — BASIC METABOLIC PANEL
ANION GAP: 7 (ref 5–15)
BUN: 18 mg/dL (ref 6–20)
CO2: 25 mmol/L (ref 22–32)
Calcium: 8.2 mg/dL — ABNORMAL LOW (ref 8.9–10.3)
Chloride: 106 mmol/L (ref 101–111)
Creatinine, Ser: 2.87 mg/dL — ABNORMAL HIGH (ref 0.61–1.24)
GFR calc Af Amer: 28 mL/min — ABNORMAL LOW (ref >60)
GFR calc non Af Amer: 24 mL/min — ABNORMAL LOW (ref >60)
GLUCOSE: 285 mg/dL — AB (ref 65–99)
POTASSIUM: 3.7 mmol/L (ref 3.5–5.1)
Sodium: 138 mmol/L (ref 135–145)

## 2016-03-14 LAB — GLUCOSE, CAPILLARY: GLUCOSE-CAPILLARY: 259 mg/dL — AB (ref 65–99)

## 2016-03-14 LAB — POCT GLYCOSYLATED HEMOGLOBIN (HGB A1C): Hemoglobin A1C: 9.8

## 2016-03-14 MED ORDER — HYDROCHLOROTHIAZIDE 25 MG PO TABS: 25.0000 mg | ORAL_TABLET | Freq: Every day | ORAL | Status: DC

## 2016-03-14 MED ORDER — ASPIRIN EC 81 MG PO TBEC: 81.0000 mg | DELAYED_RELEASE_TABLET | Freq: Every day | ORAL | Status: AC

## 2016-03-14 MED ORDER — INSULIN ASPART 100 UNIT/ML FLEXPEN: 6.0000 [IU] | PEN_INJECTOR | Freq: Three times a day (TID) | SUBCUTANEOUS | Status: DC

## 2016-03-14 MED ORDER — CARVEDILOL 12.5 MG PO TABS: 12.5000 mg | ORAL_TABLET | Freq: Two times a day (BID) | ORAL | Status: DC

## 2016-03-14 MED ORDER — FUROSEMIDE 20 MG PO TABS: 20.0000 mg | ORAL_TABLET | Freq: Every day | ORAL | Status: DC

## 2016-03-14 MED ORDER — ALBUTEROL SULFATE HFA 108 (90 BASE) MCG/ACT IN AERS: 1.0000 | INHALATION_SPRAY | Freq: Four times a day (QID) | RESPIRATORY_TRACT | Status: AC | PRN

## 2016-03-14 MED ORDER — GLUCOSE BLOOD VI STRP: ORAL_STRIP | Status: DC

## 2016-03-14 MED ORDER — ATORVASTATIN CALCIUM 40 MG PO TABS: 40.0000 mg | ORAL_TABLET | Freq: Every day | ORAL | Status: DC

## 2016-03-14 MED ORDER — ACCU-CHEK FASTCLIX LANCETS MISC: Status: DC

## 2016-03-14 MED ORDER — PANTOPRAZOLE SODIUM 40 MG PO TBEC: 40.0000 mg | DELAYED_RELEASE_TABLET | Freq: Every day | ORAL | Status: DC

## 2016-03-14 MED ORDER — INSULIN PEN NEEDLE 31G X 5 MM MISC: Status: DC

## 2016-03-14 MED ORDER — LISINOPRIL 40 MG PO TABS: 40.0000 mg | ORAL_TABLET | Freq: Every day | ORAL | Status: DC

## 2016-03-14 MED ORDER — GUAIFENESIN-DM 100-10 MG/5ML PO SYRP: 5.0000 mL | ORAL_SOLUTION | ORAL | Status: DC | PRN

## 2016-03-14 MED ORDER — INSULIN GLARGINE 100 UNIT/ML SOLOSTAR PEN: 30.0000 [IU] | PEN_INJECTOR | Freq: Every day | SUBCUTANEOUS | Status: DC

## 2016-03-14 MED ORDER — HYDROCHLOROTHIAZIDE 50 MG PO TABS: 50.0000 mg | ORAL_TABLET | Freq: Every day | ORAL | Status: DC

## 2016-03-14 MED ORDER — GABAPENTIN 400 MG PO CAPS: 400.0000 mg | ORAL_CAPSULE | Freq: Three times a day (TID) | ORAL | Status: DC

## 2016-03-14 MED ORDER — NITROGLYCERIN 0.4 MG SL SUBL: 0.4000 mg | SUBLINGUAL_TABLET | SUBLINGUAL | Status: AC | PRN

## 2016-03-14 NOTE — Patient Instructions (Signed)
Mr. Finkler,  Check your sugar BEFORE taking mealtime insulin.   It is VERY IMPORTANT that you take 30 units of Lantus at night, no more, no less for now.  We are increasing your blood pressure and heart medications, and adding on furosemide (a fluid pill) to help get fluid off.  Finally, we are making referrals to the heart doctors and to get a picture of your heart. You should be contacted soon to make those appointments.  Come back to see our clinic in 2 weeks for follow-up on these things.  Thanks, Blane Ohara

## 2016-03-14 NOTE — Progress Notes (Signed)
   Patient ID: Jeremy Yu male   DOB: [birth year] 50 y.o.   MRN: [id number]  Subjective:   HPI: [name] is a 50 y.o. 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   Of note, while his stable angina symptoms have improved greatly since I started him on carvedilol last visit, he has developed worsening dyspnea on exertion, feels "swollen up" with increased leg swelling and has also needed to use 1-2 pillows to prop his head up to sleep over the past 2 months. He also has a feeling of fatigue and malaise but denies fevers, cough, shortness of breath at rest, palpitations, N/V/D, urinary symptoms, or other symptoms at this time.  Please see problem-based charting for status of medical issues pertinent to this visit.  Review of Systems: Pertinent items noted in HPI and remainder of comprehensive ROS otherwise negative.  Objective:  Physical Exam: Filed Vitals:   03/14/16 1330  BP: 178/101  Pulse: 108  Temp: 98.3 F (36.8 C)  TempSrc: Oral  Height: $Remove'6\' 1"'BVPujsf$  (1.854 m)  Weight: 274 lb (124.286 kg)  SpO2: 98%   Gen: Well-appearing, alert and oriented to person, place, and time HEENT: Oropharynx clear without erythema or exudate.  Neck: No cervical LAD, no thyromegaly or nodules, no JVD noted. CV: Normal rate, regular rhythm, no murmurs or rubs. Pulmonary: Normal effort, CTA bilaterally, no wheezing, rales, or rhonchi Abdominal: Soft, non-tender, non-distended, without rebound, guarding, or masses Extremities: Distal pulses 2+ in upper and lower extremities bilaterally, no tenderness or erythema. There is b/l LE edema 2+ to mid calf. No lesions seen on the feet. Skin: No atypical appearing moles. Small abrasion on R shin from past fall. Otherwise no rashes/lesions found. Neuro: Decreased sensation to fine touch in bilateral lower  extremities up to mid calf. Decreased sensation to fine touch in all fingers bilateraly. Normal strength. No focal weakness.  Assessment & Plan:  Please see problem-based charting for assessment and plan.  Blane Ohara, MD Resident Physician, PGY-1 Department of Internal Medicine [facility]

## 2016-03-14 NOTE — Assessment & Plan Note (Addendum)
BP Readings from Last 3 Encounters:  03/14/16 178/101  11/02/15 207/111  10/19/15 195/118    Lab Results  Component Value Date   NA 135* 10/19/2015   K 4.1 10/19/2015   CREATININE 1.77* 10/19/2015    Assessment: Blood pressure control:  uncontrolled Progress toward BP goal:   slowly improving Comments: On lisinopril 20, HCTZ 25, carvedilol 6.25 BID. Was supposed to come for 2 week follow-up 4 1/2 months ago to continue up-titrating medications, but did not make appointment. Stressed the utmost importance that he follows up with [place] in 2 weeks' time.  Plan: Medications:  Increase lisinopril to 40, HCTZ to 50, carvedilol to 12.5 mg BID, check BMP today

## 2016-03-17 DIAGNOSIS — N189 Chronic kidney disease, unspecified: Secondary | ICD-10-CM | POA: Insufficient documentation

## 2016-03-17 NOTE — Assessment & Plan Note (Signed)
Stable angina is much improved with titrating up carvedilol. Still intermittent. Continues to have PND and leg swelling, highly suggestive of ischemic-related heart disease. We are currently trying to medically maximize this patient although him missing appointments for months on end makes this difficult.  -Increased carvedilol to 12.5 BID, added on furosemide 20mg  QD and prescribed sublingual nitro 0.4mg  PRN for angina -Continue lisinopril, statin, ASA -Placed referral to cardiology today -Return to clinic in 2 weeks for repeat BMP

## 2016-03-17 NOTE — Assessment & Plan Note (Signed)
Lab Results  Component Value Date   HGBA1C 9.8 03/14/2016   HGBA1C 11.0 10/19/2015     Assessment: Diabetes control:  Uncontrolled Progress toward A1C goal:   Making progress Comments: Continue lantus 30U qhs, novolog flexpen 6U TID w meals. Patient was only using 10U of lantus. Reinstructed patient and aide on proper dose.  Plan: Medications:  continue current medications Home glucose monitoring: Frequency:  3-4 times/daily Timing:   meals and bedtime Instruction/counseling given: reminded to bring blood glucose meter & log to each visit, reminded to bring medications to each visit and discussed foot care Educational resources provided:   Self management tools provided:   Other plans: Will RTC in 2 weeks with his home glucose logs.     -Continue lisinopril and atorvastatin    -Increased gabapentin from $RemoveBeforeD'300mg'QSAoGXYmYFpKBK$  TID to $Remo'400mg'ZDUMQ$  TID for severe diabetic neuropathy

## 2016-03-17 NOTE — Assessment & Plan Note (Signed)
Patient with worsening DOE, PND, lower extremity edema, most suggestive of worsening ischemic-heart disease, but in addition, BMP resulted after visit shows worsening renal function since last seen 10/2016, which may be explained by his long-standing uncontrolled HTN and DM (which we are slowly trying to get in control of). In the absence of melena (and negative FOBT 4 months ago), his worsening renal function is also the most likely explanation for his worsening normocytic anemia.  -RTC in 2 weeks for BMP, CBC recheck

## 2016-03-18 NOTE — Progress Notes (Signed)
Internal Medicine Clinic Attending  Case discussed with Dr. Merrilyn Puma at the time of the visit.  We reviewed the resident's history and exam and pertinent patient test results.  I agree with the assessment, diagnosis, and plan of care documented in the resident's note.

## 2016-03-19 ENCOUNTER — Other Ambulatory Visit: Payer: Self-pay | Admitting: Internal Medicine

## 2016-03-19 DIAGNOSIS — I259 Chronic ischemic heart disease, unspecified: Secondary | ICD-10-CM

## 2016-03-22 ENCOUNTER — Telehealth: Payer: Self-pay | Admitting: Internal Medicine

## 2016-03-22 NOTE — Telephone Encounter (Signed)
New Message ° °This message is to inform you that we have made 3 consecutive attempts to contact the patient. We have also mailed a letter to the patient to inform them to call in and schedule. Although we were unsuccessful in these attempts we wanted you to be aware of our efforts. Will remove the patient from our Work Queue at this time. ° ° °[place] °[facility] ° °

## 2016-03-22 NOTE — Telephone Encounter (Signed)
Thank you very much for trying to contact him. Hopefully, we will try again at his next office visit. I appreciate the follow up!  Jeremy Yu

## 2016-03-28 ENCOUNTER — Ambulatory Visit (INDEPENDENT_AMBULATORY_CARE_PROVIDER_SITE_OTHER): Payer: Medicaid Other | Admitting: Internal Medicine

## 2016-03-28 ENCOUNTER — Encounter: Payer: Self-pay | Admitting: Internal Medicine

## 2016-03-28 VITALS — BP 160/96 | HR 100 | Temp 98.0°F | Wt 256.1 lb

## 2016-03-28 DIAGNOSIS — R079 Chest pain, unspecified: Secondary | ICD-10-CM | POA: Diagnosis not present

## 2016-03-28 DIAGNOSIS — N189 Chronic kidney disease, unspecified: Secondary | ICD-10-CM | POA: Diagnosis not present

## 2016-03-28 DIAGNOSIS — I129 Hypertensive chronic kidney disease with stage 1 through stage 4 chronic kidney disease, or unspecified chronic kidney disease: Secondary | ICD-10-CM

## 2016-03-28 DIAGNOSIS — I10 Essential (primary) hypertension: Secondary | ICD-10-CM

## 2016-03-28 LAB — GLUCOSE, CAPILLARY: GLUCOSE-CAPILLARY: 245 mg/dL — AB (ref 65–99)

## 2016-03-28 MED ORDER — CARVEDILOL 25 MG PO TABS: 25.0000 mg | ORAL_TABLET | Freq: Two times a day (BID) | ORAL | Status: DC

## 2016-03-28 NOTE — Patient Instructions (Signed)
Thank you for your visit today.   Please return to the internal medicine clinic in 2 weeks for a BP recheck.  Please stop the hydrochlorothiazide. Please take 2 tablets of the carvedilol twice daily.  I sent your prescription for the $Remov'25mg'uqHjjg$  twice daily to the pharmacy.    Please be sure to bring all of your medications with you to every visit; this includes herbal supplements, vitamins, eye drops, and any over-the-counter medications.   Should you have any questions regarding your medications and/or any new or worsening symptoms, please be sure to call the clinic at [phone].   If you believe that you are suffering from a life threatening condition or one that may result in the loss of limb or function, then you should call 911 and proceed to the nearest Emergency Department.   A healthy lifestyle and preventative care can promote health and wellness.   Maintain regular health, dental, and eye exams.  Eat a healthy diet. Foods like vegetables, fruits, whole grains, low-fat dairy products, and lean protein foods contain the nutrients you need without too many calories. Decrease your intake of foods high in solid fats, added sugars, and salt. Get information about a proper diet from your caregiver, if necessary.  Regular physical exercise is one of the most important things you can do for your health. Most adults should get at least 150 minutes of moderate-intensity exercise (any activity that increases your heart rate and causes you to sweat) each week. In addition, most adults need muscle-strengthening exercises on 2 or more days a week.   Maintain a healthy weight. The body mass index (BMI) is a screening tool to identify possible weight problems. It provides an estimate of body fat based on height and weight. Your caregiver can help determine your BMI, and can help you achieve or maintain a healthy weight. For adults 20 years and older:  A BMI below 18.5 is considered underweight.  A  BMI of 18.5 to 24.9 is normal.  A BMI of 25 to 29.9 is considered overweight.  A BMI of 30 and above is considered obese.  DASH Eating Plan DASH stands for "Dietary Approaches to Stop Hypertension." The DASH eating plan is a healthy eating plan that has been shown to reduce high blood pressure (hypertension). Additional health benefits may include reducing the risk of type 2 diabetes mellitus, heart disease, and stroke. The DASH eating plan may also help with weight loss. WHAT DO I NEED TO KNOW ABOUT THE DASH EATING PLAN? For the DASH eating plan, you will follow these general guidelines:  Choose foods with a percent daily value for sodium of less than 5% (as listed on the food label).  Use salt-free seasonings or herbs instead of table salt or sea salt.  Check with your health care provider or pharmacist before using salt substitutes.  Eat lower-sodium products, often labeled as "lower sodium" or "no salt added."  Eat fresh foods.  Eat more vegetables, fruits, and low-fat dairy products.  Choose whole grains. Look for the word "whole" as the first word in the ingredient list.  Choose fish and skinless chicken or [place] more often than red meat. Limit fish, poultry, and meat to 6 oz (170 g) each day.  Limit sweets, desserts, sugars, and sugary drinks.  Choose heart-healthy fats.  Limit cheese to 1 oz (28 g) per day.  Eat more home-cooked food and less restaurant, buffet, and fast food.  Limit fried foods.  Cook foods using methods other  than frying.  Limit canned vegetables. If you do use them, rinse them well to decrease the sodium.  When eating at a restaurant, ask that your food be prepared with less salt, or no salt if possible. WHAT FOODS CAN I EAT? Seek help from a dietitian for individual calorie needs. Grains Whole grain or whole wheat bread. Brown rice. Whole grain or whole wheat pasta. Quinoa, bulgur, and whole grain cereals. Low-sodium cereals. Corn or whole  wheat flour tortillas. Whole grain cornbread. Whole grain crackers. Low-sodium crackers. Vegetables Fresh or frozen vegetables (raw, steamed, roasted, or grilled). Low-sodium or reduced-sodium tomato and vegetable juices. Low-sodium or reduced-sodium tomato sauce and paste. Low-sodium or reduced-sodium canned vegetables.  Fruits All fresh, canned (in natural juice), or frozen fruits. Meat and Other Protein Products Ground beef (85% or leaner), grass-fed beef, or beef trimmed of fat. Skinless chicken or [place]. Ground chicken or [place]. Pork trimmed of fat. All fish and seafood. Eggs. Dried beans, peas, or lentils. Unsalted nuts and seeds. Unsalted canned beans. Dairy Low-fat dairy products, such as skim or 1% milk, 2% or reduced-fat cheeses, low-fat ricotta or cottage cheese, or plain low-fat yogurt. Low-sodium or reduced-sodium cheeses. Fats and Oils Tub margarines without trans fats. Light or reduced-fat mayonnaise and salad dressings (reduced sodium). Avocado. Safflower, olive, or canola oils. Natural peanut or almond butter. Other Unsalted popcorn and pretzels. The items listed above may not be a complete list of recommended foods or beverages. Contact your dietitian for more options. WHAT FOODS ARE NOT RECOMMENDED? Grains White bread. White pasta. White rice. Refined cornbread. Bagels and croissants. Crackers that contain trans fat. Vegetables Creamed or fried vegetables. Vegetables in a cheese sauce. Regular canned vegetables. Regular canned tomato sauce and paste. Regular tomato and vegetable juices. Fruits Dried fruits. Canned fruit in light or heavy syrup. Fruit juice. Meat and Other Protein Products Fatty cuts of meat. Ribs, chicken wings, bacon, sausage, bologna, salami, chitterlings, fatback, hot dogs, bratwurst, and packaged luncheon meats. Salted nuts and seeds. Canned beans with salt. Dairy Whole or 2% milk, cream, half-and-half, and cream cheese. Whole-fat or sweetened yogurt.  Full-fat cheeses or blue cheese. Nondairy creamers and whipped toppings. Processed cheese, cheese spreads, or cheese curds. Condiments Onion and garlic salt, seasoned salt, table salt, and sea salt. Canned and packaged gravies. Worcestershire sauce. Tartar sauce. Barbecue sauce. Teriyaki sauce. Soy sauce, including reduced sodium. Steak sauce. Fish sauce. Oyster sauce. Cocktail sauce. Horseradish. Ketchup and mustard. Meat flavorings and tenderizers. Bouillon cubes. Hot sauce. Tabasco sauce. Marinades. Taco seasonings. Relishes. Fats and Oils Butter, stick margarine, lard, shortening, ghee, and bacon fat. Coconut, palm kernel, or palm oils. Regular salad dressings. Other Pickles and olives. Salted popcorn and pretzels. The items listed above may not be a complete list of foods and beverages to avoid. Contact your dietitian for more information. WHERE CAN I FIND MORE INFORMATION? National Heart, Lung, and Blood Institute: travelstabloid.com   This information is not intended to replace advice given to you by your health care provider. Make sure you discuss any questions you have with your health care provider.   Document Released: 11/28/2011 Document Revised: 12/30/2014 Document Reviewed: 10/13/2013 Elsevier Interactive Patient Education [employer].

## 2016-03-28 NOTE — Progress Notes (Signed)
Patient ID: Jeremy Yu, male   DOB: 07-24-1966, 50 y.o.   MRN: [id number]     Subjective:   Patient ID: Jeremy Yu male    DOB: 1966/05/30 50 y.o.    MRN: [id number] Health Maintenance Due: Health Maintenance Due  Topic Date Due  . PNEUMOCOCCAL POLYSACCHARIDE VACCINE (1) 05/03/1968  . OPHTHALMOLOGY EXAM  05/03/1976  . TETANUS/TDAP  05/03/1985    _________________________________________________  HPI: Jeremy Yu is a 50 y.o. male here for a BP recheck. Pt has a PMH outlined below.  Please see problem-based charting assessment and plan for further status of patient's chronic medical problems addressed at today's visit.  PMH: Past Medical History  Diagnosis Date  . Hypertension   . Diabetes mellitus without complication ([place])   . Asthma   . Blind     Medications: Current Outpatient Prescriptions on File Prior to Visit  Medication Sig Dispense Refill  . ACCU-CHEK FASTCLIX LANCETS MISC Use 1 lancet per glucose check 100 each 2  . albuterol (PROVENTIL HFA;VENTOLIN HFA) 108 (90 Base) MCG/ACT inhaler Inhale 1-2 puffs into the lungs every 6 (six) hours as needed for wheezing or shortness of breath. 1 Inhaler 3  . aspirin EC 81 MG tablet Take 1 tablet (81 mg total) by mouth daily. 30 tablet 3  . atorvastatin (LIPITOR) 40 MG tablet Take 1 tablet (40 mg total) by mouth daily. 30 tablet 2  . beclomethasone (QVAR) 80 MCG/ACT inhaler Inhale 2 puffs into the lungs 2 (two) times daily. 1 Inhaler 2  . Blood Glucose Monitoring Suppl (ACCU-CHEK NANO SMARTVIEW) W/DEVICE KIT Check blood sugar 4 times daily. 1 kit 0  . furosemide (LASIX) 20 MG tablet Take 1 tablet (20 mg total) by mouth daily. 30 tablet 3  . gabapentin (NEURONTIN) 400 MG capsule Take 1 capsule (400 mg total) by mouth 3 (three) times daily. 90 capsule 3  . glucose blood (ACCU-CHEK SMARTVIEW) test strip Use as instructed 100 each 2  . guaiFENesin-dextromethorphan (ROBITUSSIN DM) 100-10 MG/5ML syrup Take 5 mLs by  mouth every 4 (four) hours as needed for cough. 118 mL 0  . insulin aspart (NOVOLOG FLEXPEN) 100 UNIT/ML FlexPen Inject 6 Units into the skin 3 (three) times daily with meals. 15 mL 11  . Insulin Glargine (LANTUS SOLOSTAR) 100 UNIT/ML Solostar Pen Inject 30 Units into the skin daily at 10 pm. 15 mL 11  . Insulin Pen Needle 31G X 5 MM MISC Use on syringe per injection 100 each 11  . lisinopril (PRINIVIL,ZESTRIL) 40 MG tablet Take 1 tablet (40 mg total) by mouth daily. 30 tablet 3  . nitroGLYCERIN (NITROSTAT) 0.4 MG SL tablet Place 1 tablet (0.4 mg total) under the tongue every 5 (five) minutes as needed for chest pain. 30 tablet 12  . pantoprazole (PROTONIX) 40 MG tablet Take 1 tablet (40 mg total) by mouth daily. 30 tablet 2   No current facility-administered medications on file prior to visit.    Allergies: Allergies  Allergen Reactions  . Penicillins Shortness Of Breath and Itching    FH: No family history on file.  SH: Social History   Social History  . Marital Status: Single    Spouse Name: N/A  . Number of Children: N/A  . Years of Education: N/A   Social History Main Topics  . Smoking status: Never Smoker   . Smokeless tobacco: None  . Alcohol Use: No  . Drug Use: No  . Sexual Activity: Not Asked   Other Topics Concern  .  None   Social History Narrative    Review of Systems: Constitutional: Negative for fever, chills and weight loss.  Respiratory: Negative for cough and shortness of breath.  Cardiovascular: Negative for chest pain and leg swelling.  Gastrointestinal: Negative for nausea, vomiting, abdominal pain.   Objective:   Vital Signs: Filed Vitals:   03/28/16 1325  BP: 160/96  Pulse: 100  Temp: 98 F (36.7 C)  TempSrc: Oral  Weight: 256 lb 1.6 oz (116.166 kg)  SpO2: 99%      BP Readings from Last 3 Encounters:  03/28/16 160/96  03/14/16 178/101  11/02/15 207/111    Physical Exam: Constitutional: Vital signs reviewed.  Patient is in NAD  and cooperative with exam.  Head: Normocephalic and atraumatic. Eyes: EOMI, conjunctivae nl, no scleral icterus.  Neck: Supple. Cardiovascular: RRR, no MRG. Pulmonary/Chest: normal effort, CTAB, no wheezes, rales, or rhonchi. Abdominal: Soft. NT/ND +BS. Neurological: A&O x3, cranial nerves II-XII are grossly intact, moving all extremities. Extremities: Trace LE edema. Skin: Warm, dry and intact. No rash.   Assessment & Plan:   Assessment and plan was discussed and formulated with my attending.   

## 2016-03-29 LAB — BMP8+ANION GAP
ANION GAP: 20 mmol/L — AB (ref 10.0–18.0)
BUN/Creatinine Ratio: 10 (ref 9–20)
BUN: 26 mg/dL — AB (ref 6–24)
CALCIUM: 8.6 mg/dL — AB (ref 8.7–10.2)
CHLORIDE: 96 mmol/L (ref 96–106)
CO2: 22 mmol/L (ref 18–29)
Creatinine, Ser: 2.67 mg/dL — ABNORMAL HIGH (ref 0.76–1.27)
GFR calc non Af Amer: 27 mL/min/{1.73_m2} — ABNORMAL LOW (ref >59)
GFR, EST AFRICAN AMERICAN: 31 mL/min/{1.73_m2} — AB (ref >59)
GLUCOSE: 247 mg/dL — AB (ref 65–99)
POTASSIUM: 4.5 mmol/L (ref 3.5–5.2)
Sodium: 138 mmol/L (ref 134–144)

## 2016-03-31 NOTE — Assessment & Plan Note (Addendum)
Pt presents for f/u of HTN.  BP still elevated today above goal.  He reports compliance with meds and denies any LE swelling, dyspnea, chest pain.  Denies OTC meds such as decongestants or NSAID use.  No ETOH or tobacco abuse.  Limits sodium intake. Unfortunately he has been taking lasix and HCTZ.  He was instructed to d/c HCTZ today.  -d/c HCTZ -continue all other meds with increasing coreg from 12.$RemoveBefor'5mg'HuSDcYyKaUNu$  to $R'25mg'sy$  bid -f/u in 2 weeks   -keep appt with Dr. Johnsie Cancel  -check BMP

## 2016-03-31 NOTE — Assessment & Plan Note (Addendum)
Pt has been taking lasix and HCTZ with a rising SCr.  Also with DM.  His mother was on HD.   -d/c HCTZ  -consider referral to nephrology given GFR~30 -repeat BMP today -will need to be careful to renally dose medications (will need to decrease gabapentin dose for example) -d/c gabapentin $RemoveBeforeD'1200mg'GkeUzgIjyefeVL$  (total daily dose) today and discuss neuropathy at next OV and resume renally adjusted dose after discussion with Jeremy Yu  -f/u 2 wks to repeat BMP now off HCTZ

## 2016-03-31 NOTE — Assessment & Plan Note (Signed)
Denies CP today. -has appt with Dr. Johnsie Cancel on 4/25

## 2016-04-01 NOTE — Progress Notes (Signed)
Internal Medicine Clinic Attending  Case discussed with Dr. Gill at the time of the visit.  We reviewed the resident's history and exam and pertinent patient test results.  I agree with the assessment, diagnosis, and plan of care documented in the resident's note.  

## 2016-04-11 ENCOUNTER — Encounter: Payer: Self-pay | Admitting: Internal Medicine

## 2016-04-11 ENCOUNTER — Ambulatory Visit (INDEPENDENT_AMBULATORY_CARE_PROVIDER_SITE_OTHER): Payer: Medicaid Other | Admitting: Internal Medicine

## 2016-04-11 VITALS — BP 158/90 | HR 92 | Temp 98.0°F | Wt 260.7 lb

## 2016-04-11 DIAGNOSIS — I129 Hypertensive chronic kidney disease with stage 1 through stage 4 chronic kidney disease, or unspecified chronic kidney disease: Secondary | ICD-10-CM | POA: Diagnosis not present

## 2016-04-11 DIAGNOSIS — E11319 Type 2 diabetes mellitus with unspecified diabetic retinopathy without macular edema: Secondary | ICD-10-CM | POA: Diagnosis not present

## 2016-04-11 DIAGNOSIS — N189 Chronic kidney disease, unspecified: Secondary | ICD-10-CM | POA: Diagnosis not present

## 2016-04-11 DIAGNOSIS — I209 Angina pectoris, unspecified: Secondary | ICD-10-CM

## 2016-04-11 DIAGNOSIS — E1165 Type 2 diabetes mellitus with hyperglycemia: Secondary | ICD-10-CM | POA: Diagnosis present

## 2016-04-11 DIAGNOSIS — E1122 Type 2 diabetes mellitus with diabetic chronic kidney disease: Secondary | ICD-10-CM

## 2016-04-11 DIAGNOSIS — I10 Essential (primary) hypertension: Secondary | ICD-10-CM

## 2016-04-11 DIAGNOSIS — Z794 Long term (current) use of insulin: Secondary | ICD-10-CM

## 2016-04-11 LAB — GLUCOSE, CAPILLARY: Glucose-Capillary: 171 mg/dL — ABNORMAL HIGH (ref 65–99)

## 2016-04-11 LAB — BASIC METABOLIC PANEL
Anion gap: 10 (ref 5–15)
BUN: 40 mg/dL — AB (ref 6–20)
CHLORIDE: 104 mmol/L (ref 101–111)
CO2: 23 mmol/L (ref 22–32)
Calcium: 8.8 mg/dL — ABNORMAL LOW (ref 8.9–10.3)
Creatinine, Ser: 3.13 mg/dL — ABNORMAL HIGH (ref 0.61–1.24)
GFR calc Af Amer: 25 mL/min — ABNORMAL LOW (ref >60)
GFR calc non Af Amer: 22 mL/min — ABNORMAL LOW (ref >60)
Glucose, Bld: 216 mg/dL — ABNORMAL HIGH (ref 65–99)
POTASSIUM: 3.9 mmol/L (ref 3.5–5.1)
SODIUM: 137 mmol/L (ref 135–145)

## 2016-04-11 MED ORDER — INSULIN GLARGINE 100 UNIT/ML SOLOSTAR PEN: 40.0000 [IU] | PEN_INJECTOR | Freq: Every day | SUBCUTANEOUS | Status: DC

## 2016-04-11 NOTE — Progress Notes (Signed)
Medicine attending: Medical history, presenting problems, physical findings, and medications, reviewed with resident physician Dr Gwenlyn Fudge on the day of the patient visit and I concur with his evaluation and management plan.

## 2016-04-12 ENCOUNTER — Telehealth: Payer: Self-pay | Admitting: Internal Medicine

## 2016-04-12 MED ORDER — AMLODIPINE BESYLATE 10 MG PO TABS: 10.0000 mg | ORAL_TABLET | Freq: Every day | ORAL | Status: DC

## 2016-04-12 NOTE — Progress Notes (Signed)
   Patient ID: Jeremy Yu male   DOB: [birth year] 50 y.o.   MRN: [id number]  Subjective:   HPI: [name] is a 50 y.o. 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   He says his anginal symptoms have resolved for the time being. His dyspnea on exertion, orthopnea, and PND are all present but improving, as is the swelling in his legs.  He did sustain a fall several days ago and fell on his arm, and complains of mild soreness, but says it is much better than a few days ago.  Otherwise, he has no complaints at this time  Please see problem-based charting for status of medical issues pertinent to this visit.  Review of Systems: Pertinent items noted in HPI and remainder of comprehensive ROS otherwise negative.  Objective:  Physical Exam: Filed Vitals:   04/11/16 0900  BP: 158/90  Pulse: 92  Temp: 98 F (36.7 C)  TempSrc: Oral  Weight: 260 lb 11.2 oz (118.253 kg)  SpO2: 98%   Gen: Well-appearing, alert and oriented to person, place, and time HEENT: Oropharynx clear without erythema or exudate.  Neck: No cervical LAD, no thyromegaly or nodules, no JVD noted. CV: Normal rate, regular rhythm, no murmurs, rubs, or gallops Pulmonary: Normal effort, CTA bilaterally, no wheezing, rales, or rhonchi Abdominal: Soft, non-tender, non-distended, without rebound, guarding, or masses Extremities: Distal pulses 2+ in upper and lower extremities bilaterally. There is bilateral lower extremity edema, nonpitting, to the lower calf. Neuro: Decreased sensation of fine touch in bilateral lower extremities up to the midcalf. Decreased sensation to fine touch in all fingers bilaterally. Normal strength. No focal weaknesses Skin: No atypical appearing moles. No rashes  Assessment & Plan:  Please see problem-based charting for  assessment and plan.  Blane Ohara, MD Resident Physician, PGY-1 Department of Internal Medicine [facility]

## 2016-04-12 NOTE — Assessment & Plan Note (Signed)
He continues to deny any more symptoms of angina since increasing his Coreg. He has not required any when necessary nitroglycerin since I have last seen him. -Follow-up cardiology recommendations; he has an appointment with cardiology next week -Continue current medical management

## 2016-04-12 NOTE — Assessment & Plan Note (Signed)
Lab Results  Component Value Date   HGBA1C 9.8 03/14/2016   HGBA1C 11.0 10/19/2015     Assessment: Diabetes control:  Uncontrolled Progress toward A1C goal:   Making progress Comments: On Lantus 30 units daily at bedtime, and NovoLog flex pen 6 units 3 times a day with meals. He brought his blood glucometer which shows the majority of his sugars in the mid 200s with some occasional episodes in the 300s to 500s. The patient denies any symptoms during these episodes. He is not having worsening neuropathic pain despite decreasing his gabapentin due to chronic kidney disease  Plan: Medications:  Increase Lantus to 40 units daily at bedtime Home glucose monitoring: Frequency:  3-4 times daily Timing:  meals and bedtime

## 2016-04-12 NOTE — Assessment & Plan Note (Signed)
While patient was on Lasix and HCTZ for uncontrolled hypertension which was likely contributing to his worsening renal function, he actually had improvement in his renal function in the duration that he was on both medications. Since then, H CTZ he has been discontinued and his blood pressures are still in an elevated but more acceptable range. However, our labs today indicate a worsening creatinine even off of HCTZ, now at 3, with a GFR under 30. This is likely due to continued worsening diabetic and hypertensive nephropathy. -Place referral to nephrology today for evaluation of worsening chronic kidney disease

## 2016-04-12 NOTE — Telephone Encounter (Signed)
  Reason for call:   I placed an outgoing call to Jeremy Yu at 12:45  PM regarding The results of his BMP. I left a voicemail on his home phone. I told him the results of his BMP, showing worsening kidney function. I told him to hold the furosemide at this time, Instead start amlodipine and continue his other medications for the time being . I also placed a referral to see the nephrologists outpatient.   Assessment/ Plan:   Hold furosemide  Start amlodipine  Referral to nephrology  As always, pt is advised that if symptoms worsen or new symptoms arise, they should go to an urgent care facility or to to ER for further evaluation.   Norval Gable, MD   04/12/2016, 12:48 PM

## 2016-04-12 NOTE — Assessment & Plan Note (Addendum)
BP Readings from Last 3 Encounters:  04/11/16 158/90  03/28/16 160/96  03/14/16 178/101    Lab Results  Component Value Date   NA 137 04/11/2016   K 3.9 04/11/2016   CREATININE 3.13* 04/11/2016    Assessment: Blood pressure control:  elevated Progress toward BP goal:   still not at goal, but continuing to make progress (in the 903Y systolic at our first visit just a few months ago) Comments: On lisinopril 40, Lasix 20, carvedilol 25 mg twice a day  Plan: Medications: Discontinue Lasix and start amlodipine. Continue lisinopril and carvedilol as prescribed

## 2016-04-16 ENCOUNTER — Ambulatory Visit: Payer: Medicaid Other | Admitting: Cardiovascular Disease

## 2016-05-13 ENCOUNTER — Encounter: Payer: Self-pay | Admitting: Cardiovascular Disease

## 2016-05-14 NOTE — Progress Notes (Signed)
Patient ID: Jeremy Yu, male   DOB: 1966-10-10, 50 y.o.   MRN: [id number]     Cardiology Office Note   Date:  05/14/2016   ID:  Jeremy Yu, DOB 1966-09-18, MRN [id number]  PCP:  Jeremy Sanger, MD  Cardiologist:   Jeremy Rouge, MD   No chief complaint on file.     History of Present Illness: Jeremy Yu is a 50 y.o. male who presents for evaluation of chest pain and presumed CAD.  Poorly controlled DM, HTN.  Complicated by neuropathy, blindness and CRF.His baseline CR is about 3.   He has a friend Jeremy Yu that lives with him and knows him well. She indicates there are times when he has not been able to afford meds but he has been taking them last few months. Been a diabetic on insulin for over 20 years. Started going blind about 3 years ago from diabetic retinopathy.  Sees [facility].  Still urinating a  Lot. No documented CAD, No history of MI. 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  BP and pulse very high in office today.  Chronic exertional dyspnea. No PND Chronic LE edema not on diuretic Cholesterol and Triglycerides are very high on statin       Past Medical History  Diagnosis Date  . Hypertension   . Diabetes mellitus without complication ([place])   . Asthma   . Blind     No past surgical history on file.   Current Outpatient Prescriptions  Medication Sig Dispense Refill  . ACCU-CHEK FASTCLIX LANCETS MISC Use 1 lancet per glucose check 100 each 2  . albuterol (PROVENTIL HFA;VENTOLIN HFA) 108 (90 Base) MCG/ACT inhaler Inhale 1-2 puffs into the lungs every 6 (six) hours as needed for wheezing or shortness of breath. 1 Inhaler 3  . amLODipine (NORVASC) 10 MG tablet Take 1 tablet (10 mg total) by mouth daily. 30 tablet 11  . aspirin EC 81 MG tablet Take 1 tablet (81 mg total) by mouth daily. 30 tablet 3  . atorvastatin (LIPITOR) 40 MG tablet Take 1 tablet (40 mg total) by mouth daily. 30 tablet 2  . beclomethasone (QVAR) 80 MCG/ACT inhaler Inhale  2 puffs into the lungs 2 (two) times daily. 1 Inhaler 2  . Blood Glucose Monitoring Suppl (ACCU-CHEK NANO SMARTVIEW) W/DEVICE KIT Check blood sugar 4 times daily. 1 kit 0  . carvedilol (COREG) 25 MG tablet Take 1 tablet (25 mg total) by mouth 2 (two) times daily with a meal. 60 tablet 1  . glucose blood (ACCU-CHEK SMARTVIEW) test strip Use as instructed 100 each 2  . guaiFENesin-dextromethorphan (ROBITUSSIN DM) 100-10 MG/5ML syrup Take 5 mLs by mouth every 4 (four) hours as needed for cough. 118 mL 0  . insulin aspart (NOVOLOG FLEXPEN) 100 UNIT/ML FlexPen Inject 6 Units into the skin 3 (three) times daily with meals. 15 mL 11  . Insulin Glargine (LANTUS SOLOSTAR) 100 UNIT/ML Solostar Pen Inject 40 Units into the skin daily at 10 pm. 15 mL 11  . Insulin Pen Needle 31G X 5 MM MISC Use on syringe per injection 100 each 11  . lisinopril (PRINIVIL,ZESTRIL) 40 MG tablet Take 1 tablet (40 mg total) by mouth daily. 30 tablet 3  . nitroGLYCERIN (NITROSTAT) 0.4 MG SL tablet Place 1 tablet (0.4 mg total) under the tongue every 5 (five) minutes as needed for chest pain. 30 tablet 12  . pantoprazole (PROTONIX) 40 MG tablet Take 1 tablet (40 mg total) by mouth daily. 30 tablet  2   No current facility-administered medications for this visit.    Allergies:   Penicillins    Social History:  The patient  reports that he has never smoked. He does not have any smokeless tobacco history on file. He reports that he does not drink alcohol or use illicit drugs.   Family History:  The patient's family history includes Arthritis in his father; Blindness in his mother; Cancer in his father; Diabetes in his mother; GER disease in his mother; Heart failure in his mother; Hypertension in his father and mother.    ROS:  Please see the history of present illness.   Otherwise, review of systems are positive for none.   All other systems are reviewed and negative.    PHYSICAL EXAM: VS:  There were no vitals taken for this  visit. , BMI There is no weight on file to calculate BMI. Affect appropriate Healthy:  appears stated age 25: normal Neck supple with no adenopathy JVP normal no bruits no thyromegaly Lungs clear with no wheezing and good diaphragmatic motion Heart:  S1/S2 no murmur, no rub, gallop or click PMI normal Abdomen: benighn, BS positve, no tenderness, no AAA no bruit.  No HSM or HJR Distal pulses intact with no bruits No edema Neuro non-focal Skin warm and dry No muscular weakness    EKG:  10/19/15  SR rate 107  Possible old IMI nonspecific ST changes    Recent Labs: 10/19/2015: ALT 11 03/14/2016: Hemoglobin 8.4*; Platelets 329 04/11/2016: BUN 40*; Creatinine, Ser 3.13*; Potassium 3.9; Sodium 137    Lipid Panel    Component Value Date/Time   CHOL 484* 10/19/2015 1559   TRIG 986* 10/19/2015 1559   HDL 58 10/19/2015 1559   CHOLHDL 8.3* 10/19/2015 1559   LDLCALC Comment 10/19/2015 1559      Wt Readings from Last 3 Encounters:  04/11/16 118.253 kg (260 lb 11.2 oz)  03/28/16 116.166 kg (256 lb 1.6 oz)  03/14/16 124.286 kg (274 lb)      Other studies Reviewed: Additional studies/ records that were reviewed today include: Epic notes from IM primary [facility] notes/ labs .    ASSESSMENT AND PLAN:  1.  HTN:  Renovascular HTN  Add hydralazine 50 tid and increase coreg to tid 2. Chest Pain. High risk for CAD with over 20 yrs of IDDM  F/u lexiscan myovue 3. Dyspnea.  Check echo for RV/LV function 4. Chol:  On statin f/u primary would likely benefit from zetia or tricor will assess after tests And BP better controlled    Current medicines are reviewed at length with the patient today.  The patient does not have concerns regarding medicines.  The following changes have been made:  Hydralazine 50 tid coreg increased to tid  Labs/ tests ordered today include: Echo Lexiscan myovue   No orders of the defined types were placed in this encounter.      Disposition:   FU with me in 3 months      Signed, Jeremy Rouge, MD  05/14/2016 11:39 AM    [facility] [address] [place], [place], [place]  [address] Phone: [phone]; Fax: [phone]

## 2016-05-15 ENCOUNTER — Ambulatory Visit (INDEPENDENT_AMBULATORY_CARE_PROVIDER_SITE_OTHER): Payer: Medicaid Other | Admitting: Cardiovascular Disease

## 2016-05-15 ENCOUNTER — Encounter: Payer: Self-pay | Admitting: Cardiovascular Disease

## 2016-05-15 VITALS — BP 168/120 | HR 100 | Ht 73.0 in | Wt 261.8 lb

## 2016-05-15 DIAGNOSIS — I10 Essential (primary) hypertension: Secondary | ICD-10-CM

## 2016-05-15 DIAGNOSIS — R079 Chest pain, unspecified: Secondary | ICD-10-CM | POA: Diagnosis not present

## 2016-05-15 DIAGNOSIS — Z7189 Other specified counseling: Secondary | ICD-10-CM | POA: Diagnosis not present

## 2016-05-15 DIAGNOSIS — R0602 Shortness of breath: Secondary | ICD-10-CM | POA: Diagnosis not present

## 2016-05-15 DIAGNOSIS — Z7689 Persons encountering health services in other specified circumstances: Secondary | ICD-10-CM

## 2016-05-15 MED ORDER — HYDRALAZINE HCL 50 MG PO TABS: 50.0000 mg | ORAL_TABLET | Freq: Three times a day (TID) | ORAL | Status: DC

## 2016-05-15 MED ORDER — CARVEDILOL 25 MG PO TABS: 25.0000 mg | ORAL_TABLET | Freq: Three times a day (TID) | ORAL | Status: DC

## 2016-05-15 NOTE — Patient Instructions (Addendum)
Medication Instructions:  Your physician has recommended you make the following change in your medication:  1-Increase Coreg 25 mg by mouth three times daily with meals 2-Start Hydralazine 50 mg by mouth three times daily  Labwork: NONE  Testing/Procedures: Your physician has requested that you have an echocardiogram. Echocardiography is a painless test that uses sound waves to create images of your heart. It provides your doctor with information about the size and shape of your heart and how well your heart's chambers and valves are working. This procedure takes approximately one hour. There are no restrictions for this procedure.  Your physician has requested that you have a lexiscan myoview. For further information please visit [email or web address]. Please follow instruction sheet, as given.  Follow-Up: Your physician wants you to follow-up in: 6 months with Dr. Johnsie Cancel. You will receive a reminder letter in the mail two months in advance. If you don't receive a letter, please call our office to schedule the follow-up appointment.   If you need a refill on your cardiac medications before your next appointment, please call your pharmacy.

## 2016-05-22 ENCOUNTER — Ambulatory Visit (INDEPENDENT_AMBULATORY_CARE_PROVIDER_SITE_OTHER): Payer: Medicaid Other | Admitting: Internal Medicine

## 2016-05-22 ENCOUNTER — Encounter: Payer: Self-pay | Admitting: Internal Medicine

## 2016-05-22 VITALS — BP 152/80 | HR 87 | Temp 98.0°F | Wt 263.9 lb

## 2016-05-22 DIAGNOSIS — E1122 Type 2 diabetes mellitus with diabetic chronic kidney disease: Secondary | ICD-10-CM | POA: Diagnosis not present

## 2016-05-22 DIAGNOSIS — J4540 Moderate persistent asthma, uncomplicated: Secondary | ICD-10-CM

## 2016-05-22 DIAGNOSIS — N189 Chronic kidney disease, unspecified: Secondary | ICD-10-CM

## 2016-05-22 DIAGNOSIS — Z794 Long term (current) use of insulin: Secondary | ICD-10-CM | POA: Diagnosis not present

## 2016-05-22 DIAGNOSIS — Z0000 Encounter for general adult medical examination without abnormal findings: Secondary | ICD-10-CM

## 2016-05-22 DIAGNOSIS — E1165 Type 2 diabetes mellitus with hyperglycemia: Secondary | ICD-10-CM

## 2016-05-22 DIAGNOSIS — I10 Essential (primary) hypertension: Secondary | ICD-10-CM

## 2016-05-22 DIAGNOSIS — E11319 Type 2 diabetes mellitus with unspecified diabetic retinopathy without macular edema: Secondary | ICD-10-CM | POA: Diagnosis not present

## 2016-05-22 DIAGNOSIS — R079 Chest pain, unspecified: Secondary | ICD-10-CM

## 2016-05-22 DIAGNOSIS — Z79899 Other long term (current) drug therapy: Secondary | ICD-10-CM

## 2016-05-22 DIAGNOSIS — I129 Hypertensive chronic kidney disease with stage 1 through stage 4 chronic kidney disease, or unspecified chronic kidney disease: Secondary | ICD-10-CM

## 2016-05-22 DIAGNOSIS — K219 Gastro-esophageal reflux disease without esophagitis: Secondary | ICD-10-CM

## 2016-05-22 DIAGNOSIS — I209 Angina pectoris, unspecified: Secondary | ICD-10-CM

## 2016-05-22 MED ORDER — INSULIN GLARGINE 100 UNIT/ML SOLOSTAR PEN: 22.0000 [IU] | PEN_INJECTOR | Freq: Two times a day (BID) | SUBCUTANEOUS | Status: AC

## 2016-05-22 MED ORDER — ONDANSETRON HCL 4 MG PO TABS: 4.0000 mg | ORAL_TABLET | Freq: Once | ORAL | Status: DC | PRN

## 2016-05-22 MED ORDER — INSULIN GLARGINE 100 UNIT/ML SOLOSTAR PEN: 25.0000 [IU] | PEN_INJECTOR | Freq: Two times a day (BID) | SUBCUTANEOUS | Status: DC

## 2016-05-22 NOTE — Patient Instructions (Signed)
Mr. Hanover,  Please change your Lantus to twice a day, 22 units before breakfast and 22 units at night. This should help you feel better at night. I also prescribed a medicine called Zofran to take if Alka-Seltzer is not improving or nausea night.  There is no other kidney doctor in town besides [facility]. However, we will get our clinic to help smooth and things out seeking get an appointment. Thanks for being patient and I'm sorry that you had a bad experience previously.  We'll follow-up with the heart doctors tests show and see you back in one month.  Keep up the good work! You are making a lot of progress, and although we still have a good amount to go, I appreciate you working hard with [place] to feel better. Blane Ohara

## 2016-05-22 NOTE — Assessment & Plan Note (Signed)
BP Readings from Last 3 Encounters:  05/22/16 152/80  05/15/16 168/120  04/11/16 158/90    Lab Results  Component Value Date   NA 137 04/11/2016   K 3.9 04/11/2016   CREATININE 3.13* 04/11/2016    Assessment: Blood pressure control:  improving Progress toward BP goal:   making great progress Comments: On lisinopril 40, hydralazine 50 3 times a day, carvedilol 25 mg 3 times a day, amlodipine 10 mg daily. Considering he initially came to our clinic with baseline blood pressures in the 200s, as well as progressively worsening CKD that makes medical management difficult, he has been making great strides toward optimal blood pressure control.  Plan: Medications:  continue current medications Other plans: I will hold off on adjusting blood pressure medications further until he sees nephrology.

## 2016-05-22 NOTE — Assessment & Plan Note (Signed)
Patient has had steadily worsening kidney function due to his HTN and T2 DM, which are slowly becoming better controlled with significant effort on the patient's part, so appointment to [facility] was made at last visit but he had a bad encounter with the receptionist after having to reschedule because he was attending classes at the blind school as well as training with his new cane. We will contact [facility] today or tomorrow to try and reschedule an appointment because they are the only nephrologists in town who will accept. I did reemphasize the importance of going to see the nephrologist and he was amenable to going to [facility] with our assistance. -Repeat referral to nephrology -Instructed patient to call our office if he does not hear back in the next week or 2.

## 2016-05-22 NOTE — Assessment & Plan Note (Addendum)
Saw cardiology on May 23. They increased his corrected 3 times a day and added on hydralazine. He is scheduled for an echocardiogram and Myoview on June 13. He still does have intermittent chest pain without changes in quality or frequency from last visit. -Appreciate cardiology's recommendations and thoughtful care of this mutual patient -Continue current medical management

## 2016-05-22 NOTE — Assessment & Plan Note (Signed)
Patient reports good control with as needed albuterol as well as Qvar inhaler, but has run out of the albuterol when necessary inhaler. -Refilled albuterol inhaler today

## 2016-05-22 NOTE — Assessment & Plan Note (Signed)
Lab Results  Component Value Date   HGBA1C 9.8 03/14/2016   HGBA1C 11.0 10/19/2015     Assessment: Diabetes control:  uncontrolled Progress toward A1C goal:   improving Comments: Reviewed his CBGs today, which are mostly in the 180s to 220s, with the highest being 300. While these are still higher than our goal, this is much improved from previous visits. However, he is having intermittent nausea after his Lantus injections at night, so I suspect that doing once daily at bedtime Lantus injections may be too much for him at once.  Plan: Medications:  Continue NovoLog 6U 3 times a day with meals. Split Lantus from 40U daily at bedtime to 22Uevery morning and daily at bedtime Other plans: Follow-up in one month for A1c recheck

## 2016-05-22 NOTE — Progress Notes (Signed)
   Patient ID: Isaiahs Chancy male   DOB: 1966-12-03 50 y.o.   MRN: [id number]  Subjective:   HPI: Mr.Constance Niswander is a 50 y.o. with PMH of Insulin-dependent diabetes, gated by blindness, neuropathy and nephropathy, CAD, and HTN who presents to [facility] today for follow-up of his diabetes and HTN.   Please see problem-based charting for status of medical issues pertinent to this visit.  Review of Systems: Pertinent items noted in HPI and remainder of comprehensive ROS otherwise negative.  Objective:  Physical Exam: Filed Vitals:   05/22/16 1447  BP: 152/80  Pulse: 87  Temp: 98 F (36.7 C)  TempSrc: Oral  Weight: 263 lb 14.4 oz (119.704 kg)  SpO2: 100%   Gen: Well-appearing, alert and oriented to person, place, and time HEENT: Oropharynx clear without erythema or exudate.  Neck: No cervical LAD, no thyromegaly or nodules, no JVD noted. CV: Normal rate, regular rhythm, no murmurs, rubs, or gallops Pulmonary: Normal effort, CTA bilaterally, no wheezing, rales, or rhonchi Abdominal: Soft, non-tender, non-distended, without rebound, guarding, or masses Extremities: Distal pulses 2+ in upper and lower extremities bilaterally, no tenderness, erythema. 2+ pitting edema to the midcalf bilaterally. Skin: No atypical appearing moles. No rashes  Assessment & Plan:  Please see problem-based charting for assessment and plan.  Blane Ohara, MD Resident Physician, PGY-1 Department of Internal Medicine [facility]

## 2016-05-22 NOTE — Progress Notes (Signed)
Medicine attending: Medical history, presenting problems, physical findings, and medications, reviewed with resident physician Dr Gwenlyn Fudge on the day of the patient visit and I concur with his evaluation and management plan.

## 2016-05-22 NOTE — Assessment & Plan Note (Signed)
Influenza and pneumonia vaccines were offered at this visit, as well as colonoscopy given his age, but patient declined at this time -Re offer at next visit

## 2016-05-30 ENCOUNTER — Telehealth (HOSPITAL_COMMUNITY): Payer: Self-pay | Admitting: *Deleted

## 2016-05-30 NOTE — Telephone Encounter (Signed)
Patient given detailed instructions per Myocardial Perfusion Study Information Sheet for the test on  06/04/16. Patient notified to arrive 15 minutes early and that it is imperative to arrive on time for appointment to keep from having the test rescheduled.  If you need to cancel or reschedule your appointment, please call the office within 24 hours of your appointment. Failure to do so may result in a cancellation of your appointment, and a $50 no show fee. Patient verbalized understanding.Hubbard Robinson, RN

## 2016-06-04 ENCOUNTER — Other Ambulatory Visit (HOSPITAL_COMMUNITY): Payer: Medicaid Other

## 2016-06-04 ENCOUNTER — Encounter (HOSPITAL_COMMUNITY): Payer: Medicaid Other

## 2016-06-11 ENCOUNTER — Encounter: Payer: Self-pay | Admitting: *Deleted

## 2016-06-21 ENCOUNTER — Ambulatory Visit (INDEPENDENT_AMBULATORY_CARE_PROVIDER_SITE_OTHER): Payer: Medicaid Other | Admitting: Internal Medicine

## 2016-06-21 ENCOUNTER — Encounter: Payer: Self-pay | Admitting: Internal Medicine

## 2016-06-21 DIAGNOSIS — I129 Hypertensive chronic kidney disease with stage 1 through stage 4 chronic kidney disease, or unspecified chronic kidney disease: Secondary | ICD-10-CM

## 2016-06-21 DIAGNOSIS — I10 Essential (primary) hypertension: Secondary | ICD-10-CM

## 2016-06-21 DIAGNOSIS — Z794 Long term (current) use of insulin: Secondary | ICD-10-CM

## 2016-06-21 DIAGNOSIS — N184 Chronic kidney disease, stage 4 (severe): Secondary | ICD-10-CM

## 2016-06-21 DIAGNOSIS — E11319 Type 2 diabetes mellitus with unspecified diabetic retinopathy without macular edema: Secondary | ICD-10-CM

## 2016-06-21 DIAGNOSIS — E1165 Type 2 diabetes mellitus with hyperglycemia: Secondary | ICD-10-CM | POA: Diagnosis not present

## 2016-06-21 DIAGNOSIS — E1122 Type 2 diabetes mellitus with diabetic chronic kidney disease: Secondary | ICD-10-CM

## 2016-06-21 DIAGNOSIS — Z7409 Other reduced mobility: Secondary | ICD-10-CM | POA: Insufficient documentation

## 2016-06-21 LAB — POCT GLYCOSYLATED HEMOGLOBIN (HGB A1C): Hemoglobin A1C: 7.7

## 2016-06-21 LAB — GLUCOSE, CAPILLARY: GLUCOSE-CAPILLARY: 148 mg/dL — AB (ref 65–99)

## 2016-06-21 MED ORDER — INSULIN ASPART 100 UNIT/ML FLEXPEN: 10.0000 [IU] | PEN_INJECTOR | Freq: Three times a day (TID) | SUBCUTANEOUS | Status: AC

## 2016-06-21 NOTE — Assessment & Plan Note (Signed)
Filed Vitals:   06/21/16 1332  BP: 116/63  Pulse: 86  Temp: 98.2 F (36.8 C)   BP well controlled today on lisinopril $RemoveBefor'40mg'UKZwGARuIVWB$  daily + hydral $Remove'50mg'CNdDdvC$  tid + coreg $Remov'25mg'HiQOzr$  (writted as TID not sure why) + amlodipine $RemoveBefo'10mg'PyQSkOhAJOE$  daily. Cont same regimen.

## 2016-06-21 NOTE — Assessment & Plan Note (Signed)
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 Missed appt with [facility], asked patient to call them to reschedule the appt.

## 2016-06-21 NOTE — Assessment & Plan Note (Signed)
  Last a1c 3 months ago 9.8, today hgba1c was 7.7.  Home sugars are in high 200's, does not check very frequently. No hypoglycemias.  on lantus 22 u BID + novolog 6 u TID, not on metformin due to low GFR.  Will increase novolog to 10 units TID Cont lantus 22 bid Asked to check sugar regularly 3x daily. F/up in 1 month.

## 2016-06-21 NOTE — Progress Notes (Signed)
   Subjective:    Patient ID: Jeremy Yu, male    DOB: 04-23-1966, 50 y.o.   MRN: [id number]  HPI 50 yo male with HTN, asthma, GERD, DM II, here for follow up of HTN and DM II   HTN - on lisinopril $RemoveBefor'40mg'InISDzidbVsT$  daily + hydral $Remove'50mg'xazTnoD$  tid + coreg $Remov'25mg'afbWSz$  (writted as TID not sure why) + amlodipine $RemoveBefo'10mg'GeVmnrrCQHm$  daily.  DM II - on lantus 22 u BID + novolog 6 u TID, not on metformin due to low GFR. Last a1c 3 months ago 9.8, today hgba1c was 7.7.  Home sugars are in high 200's, does not check very frequently. No hypoglycemias.   Went to hospital for being off balance, fell, hit his chest, had some chest pain from the falls. Was there overnight, cardiologist evaluated him and said everything was ok. He was put on azithromycin for 3 days. Not sure why caused the falls, he feels that it could be from his vision impairment getting worse. No chest pain currently.    Review of Systems  Constitutional: Negative for fever and chills.  HENT: Negative for congestion and sore throat.   Eyes: Positive for visual disturbance.  Respiratory: Negative for chest tightness and shortness of breath.   Cardiovascular: Negative for chest pain, palpitations and leg swelling.  Gastrointestinal: Negative for abdominal pain and abdominal distention.  Neurological: Negative for dizziness, weakness and numbness.       Objective:   Physical Exam  Constitutional: He is oriented to person, place, and time. He appears well-developed and well-nourished. No distress.  Sitting in wheel chair  HENT:  Head: Normocephalic and atraumatic.  Eyes: Right eye exhibits no discharge. Left eye exhibits no discharge. No scleral icterus.  Wearing sunglasses  Neck: Normal range of motion.  Cardiovascular: Normal rate, regular rhythm, S1 normal, S2 normal and normal heart sounds.  Exam reveals no gallop and no friction rub.   No murmur heard. Pulmonary/Chest: Effort normal and breath sounds normal. No respiratory distress. He has no wheezes. He has  no rales. He exhibits no tenderness.  Abdominal: Soft. Bowel sounds are normal. He exhibits no distension. There is no tenderness.  Musculoskeletal: Normal range of motion. He exhibits no edema or tenderness.  Neurological: He is alert and oriented to person, place, and time. He has normal strength and normal reflexes. No cranial nerve deficit or sensory deficit.  Skin: He is not diaphoretic.  Psychiatric: He has a normal mood and affect.    Filed Vitals:   06/21/16 1332  BP: 116/63  Pulse: 86  Temp: 98.2 F (36.8 C)         Assessment & Plan:  See problem based a&p.

## 2016-06-21 NOTE — Assessment & Plan Note (Signed)
Having impaired mobility likely 2/2 to vision loss. Will have him see PT for gait training.

## 2016-06-21 NOTE — Patient Instructions (Signed)
You are doing great, your diabetes is being better controlled.  We will increase Novolog (short acting) to 10 units three times with meals.  Continue all other medications at the current dose.  Follow up in 1 month.  Will refer to Physical therapy.  Call the [facility] to reschedule the appointment.  Call your cardiologist office to ask whether it's still necessary to do the stress test.

## 2016-06-24 ENCOUNTER — Other Ambulatory Visit: Payer: Self-pay | Admitting: Internal Medicine

## 2016-06-24 NOTE — Progress Notes (Signed)
Internal Medicine Clinic Attending  Case discussed with Dr. Ahmed at the time of the visit.  We reviewed the resident's history and exam and pertinent patient test results.  I agree with the assessment, diagnosis, and plan of care documented in the resident's note. 

## 2016-06-26 NOTE — Telephone Encounter (Signed)
HCTZ not listed on active med list. D/C by Dr. Gordy Levan on 03/28/16

## 2016-06-26 NOTE — Telephone Encounter (Signed)
Jeremy Yu was recommended to discontinue HCTZ due to concurrent use of furosemide form visit with Dr. Gordy Levan on 03/28/16. He has subsequently followed up once with cardiology clinic. I will not refill this today and he has an upcoming [facility] appointment on 8/11 where his hypertension can be addressed again.

## 2016-07-02 ENCOUNTER — Other Ambulatory Visit: Payer: Self-pay | Admitting: Internal Medicine

## 2016-07-08 ENCOUNTER — Encounter: Payer: Self-pay | Admitting: *Deleted

## 2016-07-17 ENCOUNTER — Telehealth (HOSPITAL_COMMUNITY): Payer: Self-pay | Admitting: *Deleted

## 2016-07-17 NOTE — Telephone Encounter (Signed)
Left message on voicemail in reference to upcoming appointment scheduled for 07/19/16  Phone number given for a call back so details instructions can be given. Hubbard Robinson, RN

## 2016-07-19 ENCOUNTER — Ambulatory Visit (HOSPITAL_BASED_OUTPATIENT_CLINIC_OR_DEPARTMENT_OTHER): Payer: Medicaid Other

## 2016-07-19 ENCOUNTER — Other Ambulatory Visit (HOSPITAL_COMMUNITY): Payer: Self-pay

## 2016-07-19 ENCOUNTER — Ambulatory Visit (HOSPITAL_COMMUNITY): Payer: Medicaid Other | Attending: Internal Medicine

## 2016-07-19 DIAGNOSIS — I119 Hypertensive heart disease without heart failure: Secondary | ICD-10-CM | POA: Diagnosis not present

## 2016-07-19 DIAGNOSIS — R0602 Shortness of breath: Secondary | ICD-10-CM | POA: Insufficient documentation

## 2016-07-19 DIAGNOSIS — R9431 Abnormal electrocardiogram [ECG] [EKG]: Secondary | ICD-10-CM | POA: Diagnosis present

## 2016-07-19 DIAGNOSIS — R079 Chest pain, unspecified: Secondary | ICD-10-CM

## 2016-07-19 LAB — MYOCARDIAL PERFUSION IMAGING
LHR: 0.33
LVDIAVOL: 156 mL (ref 62–150)
LVSYSVOL: 83 mL
NUC STRESS TID: 1.03
Peak HR: 96 {beats}/min
Rest HR: 86 {beats}/min
SDS: 0
SRS: 5
SSS: 5

## 2016-07-19 MED ORDER — TECHNETIUM TC 99M TETROFOSMIN IV KIT
30.4000 | PACK | Freq: Once | INTRAVENOUS | Status: AC | PRN
  Administered 2016-07-19: 30 via INTRAVENOUS
  Filled 2016-07-19: qty 30

## 2016-07-19 MED ORDER — TECHNETIUM TC 99M TETROFOSMIN IV KIT
10.7000 | PACK | Freq: Once | INTRAVENOUS | Status: AC | PRN
Start: 2016-07-19 — End: 2016-07-19
  Administered 2016-07-19: 11 via INTRAVENOUS
  Filled 2016-07-19: qty 11

## 2016-07-19 MED ORDER — REGADENOSON 0.4 MG/5ML IV SOLN
0.4000 mg | Freq: Once | INTRAVENOUS | Status: AC
  Administered 2016-07-19: 0.4 mg via INTRAVENOUS

## 2016-08-02 ENCOUNTER — Encounter: Payer: Medicaid Other | Admitting: Internal Medicine

## 2016-08-07 NOTE — Addendum Note (Signed)
Addended by: Hulan Fray on: 08/07/2016 05:54 PM   Modules accepted: Orders

## 2016-08-27 ENCOUNTER — Other Ambulatory Visit: Payer: Self-pay | Admitting: Internal Medicine

## 2016-08-27 DIAGNOSIS — Z794 Long term (current) use of insulin: Secondary | ICD-10-CM

## 2016-08-27 DIAGNOSIS — E11319 Type 2 diabetes mellitus with unspecified diabetic retinopathy without macular edema: Secondary | ICD-10-CM

## 2016-08-27 DIAGNOSIS — R079 Chest pain, unspecified: Secondary | ICD-10-CM

## 2016-08-30 ENCOUNTER — Encounter: Payer: Medicaid Other | Admitting: Internal Medicine

## 2016-10-03 ENCOUNTER — Encounter: Payer: Self-pay | Admitting: Internal Medicine

## 2016-10-03 ENCOUNTER — Encounter (HOSPITAL_COMMUNITY): Payer: Self-pay | Admitting: General Practice

## 2016-10-03 ENCOUNTER — Inpatient Hospital Stay (HOSPITAL_COMMUNITY)
Admission: AD | Admit: 2016-10-03 | Discharge: 2016-10-07 | DRG: 292 | Disposition: A | Discharge status: Home / Self Care | Payer: Medicaid Other | Source: Ambulatory Visit | Attending: Internal Medicine | Admitting: Internal Medicine

## 2016-10-03 ENCOUNTER — Ambulatory Visit (INDEPENDENT_AMBULATORY_CARE_PROVIDER_SITE_OTHER): Payer: Medicaid Other | Admitting: Internal Medicine

## 2016-10-03 ENCOUNTER — Inpatient Hospital Stay (HOSPITAL_COMMUNITY): Payer: Medicaid Other

## 2016-10-03 VITALS — BP 174/90 | HR 94 | Temp 97.9°F | Ht 73.0 in | Wt 249.9 lb

## 2016-10-03 DIAGNOSIS — I10 Essential (primary) hypertension: Secondary | ICD-10-CM | POA: Diagnosis present

## 2016-10-03 DIAGNOSIS — D631 Anemia in chronic kidney disease: Secondary | ICD-10-CM | POA: Diagnosis not present

## 2016-10-03 DIAGNOSIS — N184 Chronic kidney disease, stage 4 (severe): Secondary | ICD-10-CM

## 2016-10-03 DIAGNOSIS — E10319 Type 1 diabetes mellitus with unspecified diabetic retinopathy without macular edema: Secondary | ICD-10-CM

## 2016-10-03 DIAGNOSIS — Z87891 Personal history of nicotine dependence: Secondary | ICD-10-CM

## 2016-10-03 DIAGNOSIS — Z7951 Long term (current) use of inhaled steroids: Secondary | ICD-10-CM

## 2016-10-03 DIAGNOSIS — N179 Acute kidney failure, unspecified: Secondary | ICD-10-CM | POA: Diagnosis present

## 2016-10-03 DIAGNOSIS — Z6832 Body mass index (BMI) 32.0-32.9, adult: Secondary | ICD-10-CM

## 2016-10-03 DIAGNOSIS — Z8711 Personal history of peptic ulcer disease: Secondary | ICD-10-CM

## 2016-10-03 DIAGNOSIS — Z23 Encounter for immunization: Secondary | ICD-10-CM

## 2016-10-03 DIAGNOSIS — E785 Hyperlipidemia, unspecified: Secondary | ICD-10-CM | POA: Diagnosis present

## 2016-10-03 DIAGNOSIS — I5032 Chronic diastolic (congestive) heart failure: Secondary | ICD-10-CM

## 2016-10-03 DIAGNOSIS — K000 Anodontia: Secondary | ICD-10-CM | POA: Diagnosis present

## 2016-10-03 DIAGNOSIS — R710 Precipitous drop in hematocrit: Secondary | ICD-10-CM | POA: Diagnosis present

## 2016-10-03 DIAGNOSIS — K649 Unspecified hemorrhoids: Secondary | ICD-10-CM | POA: Diagnosis not present

## 2016-10-03 DIAGNOSIS — E11311 Type 2 diabetes mellitus with unspecified diabetic retinopathy with macular edema: Secondary | ICD-10-CM

## 2016-10-03 DIAGNOSIS — M797 Fibromyalgia: Secondary | ICD-10-CM | POA: Diagnosis present

## 2016-10-03 DIAGNOSIS — R296 Repeated falls: Secondary | ICD-10-CM | POA: Diagnosis present

## 2016-10-03 DIAGNOSIS — K5730 Diverticulosis of large intestine without perforation or abscess without bleeding: Secondary | ICD-10-CM | POA: Diagnosis present

## 2016-10-03 DIAGNOSIS — E669 Obesity, unspecified: Secondary | ICD-10-CM | POA: Diagnosis present

## 2016-10-03 DIAGNOSIS — R05 Cough: Secondary | ICD-10-CM

## 2016-10-03 DIAGNOSIS — K921 Melena: Secondary | ICD-10-CM | POA: Diagnosis present

## 2016-10-03 DIAGNOSIS — Z7982 Long term (current) use of aspirin: Secondary | ICD-10-CM

## 2016-10-03 DIAGNOSIS — K219 Gastro-esophageal reflux disease without esophagitis: Secondary | ICD-10-CM

## 2016-10-03 DIAGNOSIS — E1169 Type 2 diabetes mellitus with other specified complication: Secondary | ICD-10-CM

## 2016-10-03 DIAGNOSIS — E784 Other hyperlipidemia: Secondary | ICD-10-CM

## 2016-10-03 DIAGNOSIS — I130 Hypertensive heart and chronic kidney disease with heart failure and stage 1 through stage 4 chronic kidney disease, or unspecified chronic kidney disease: Secondary | ICD-10-CM | POA: Diagnosis not present

## 2016-10-03 DIAGNOSIS — K648 Other hemorrhoids: Secondary | ICD-10-CM | POA: Diagnosis present

## 2016-10-03 DIAGNOSIS — D649 Anemia, unspecified: Secondary | ICD-10-CM | POA: Diagnosis not present

## 2016-10-03 DIAGNOSIS — Z833 Family history of diabetes mellitus: Secondary | ICD-10-CM

## 2016-10-03 DIAGNOSIS — E1022 Type 1 diabetes mellitus with diabetic chronic kidney disease: Secondary | ICD-10-CM

## 2016-10-03 DIAGNOSIS — Z8719 Personal history of other diseases of the digestive system: Secondary | ICD-10-CM

## 2016-10-03 DIAGNOSIS — Z880 Allergy status to penicillin: Secondary | ICD-10-CM

## 2016-10-03 DIAGNOSIS — E1069 Type 1 diabetes mellitus with other specified complication: Secondary | ICD-10-CM

## 2016-10-03 DIAGNOSIS — Z79899 Other long term (current) drug therapy: Secondary | ICD-10-CM

## 2016-10-03 DIAGNOSIS — R51 Headache: Secondary | ICD-10-CM | POA: Diagnosis not present

## 2016-10-03 DIAGNOSIS — R053 Chronic cough: Secondary | ICD-10-CM

## 2016-10-03 DIAGNOSIS — J4540 Moderate persistent asthma, uncomplicated: Secondary | ICD-10-CM

## 2016-10-03 DIAGNOSIS — E1165 Type 2 diabetes mellitus with hyperglycemia: Secondary | ICD-10-CM | POA: Diagnosis not present

## 2016-10-03 DIAGNOSIS — E1065 Type 1 diabetes mellitus with hyperglycemia: Secondary | ICD-10-CM | POA: Diagnosis present

## 2016-10-03 DIAGNOSIS — H548 Legal blindness, as defined in USA: Secondary | ICD-10-CM | POA: Diagnosis present

## 2016-10-03 DIAGNOSIS — Z8249 Family history of ischemic heart disease and other diseases of the circulatory system: Secondary | ICD-10-CM

## 2016-10-03 DIAGNOSIS — Z5309 Procedure and treatment not carried out because of other contraindication: Secondary | ICD-10-CM | POA: Diagnosis present

## 2016-10-03 DIAGNOSIS — E1040 Type 1 diabetes mellitus with diabetic neuropathy, unspecified: Secondary | ICD-10-CM | POA: Diagnosis present

## 2016-10-03 DIAGNOSIS — K5900 Constipation, unspecified: Secondary | ICD-10-CM | POA: Diagnosis present

## 2016-10-03 DIAGNOSIS — Z794 Long term (current) use of insulin: Secondary | ICD-10-CM

## 2016-10-03 HISTORY — DX: Acute embolism and thrombosis of unspecified deep veins of unspecified lower extremity: I82.409

## 2016-10-03 HISTORY — DX: Chronic kidney disease, unspecified: N18.9

## 2016-10-03 HISTORY — DX: Type 1 diabetes mellitus without complications: E10.9

## 2016-10-03 HISTORY — DX: Depression, unspecified: F32.A

## 2016-10-03 HISTORY — DX: Fibromyalgia: M79.7

## 2016-10-03 HISTORY — DX: Polyneuropathy, unspecified: G62.9

## 2016-10-03 HISTORY — DX: Personal history of other diseases of the digestive system: Z87.19

## 2016-10-03 HISTORY — DX: Major depressive disorder, single episode, unspecified: F32.9

## 2016-10-03 HISTORY — DX: Unspecified convulsions: R56.9

## 2016-10-03 HISTORY — DX: Migraine, unspecified, not intractable, without status migrainosus: G43.909

## 2016-10-03 HISTORY — DX: Anemia, unspecified: D64.9

## 2016-10-03 HISTORY — DX: Unqualified visual loss, both eyes: H54.3

## 2016-10-03 HISTORY — DX: Repeated falls: R29.6

## 2016-10-03 HISTORY — DX: Anxiety disorder, unspecified: F41.9

## 2016-10-03 HISTORY — DX: Headache: R51

## 2016-10-03 HISTORY — DX: Type 2 diabetes mellitus with unspecified diabetic retinopathy without macular edema: E11.319

## 2016-10-03 HISTORY — DX: Headache, unspecified: R51.9

## 2016-10-03 HISTORY — DX: Unspecified chronic bronchitis: J42

## 2016-10-03 HISTORY — DX: Personal history of other medical treatment: Z92.89

## 2016-10-03 HISTORY — DX: Personal history of peptic ulcer disease: Z87.11

## 2016-10-03 HISTORY — DX: Disorder of kidney and ureter, unspecified: N28.9

## 2016-10-03 LAB — BASIC METABOLIC PANEL
ANION GAP: 6 (ref 5–15)
BUN: 19 mg/dL (ref 6–20)
CALCIUM: 8.3 mg/dL — AB (ref 8.9–10.3)
CO2: 25 mmol/L (ref 22–32)
Chloride: 110 mmol/L (ref 101–111)
Creatinine, Ser: 3.48 mg/dL — ABNORMAL HIGH (ref 0.61–1.24)
GFR, EST AFRICAN AMERICAN: 22 mL/min — AB (ref >60)
GFR, EST NON AFRICAN AMERICAN: 19 mL/min — AB (ref >60)
Glucose, Bld: 296 mg/dL — ABNORMAL HIGH (ref 65–99)
Potassium: 3.9 mmol/L (ref 3.5–5.1)
Sodium: 141 mmol/L (ref 135–145)

## 2016-10-03 LAB — CBC WITH DIFFERENTIAL/PLATELET
BASOS ABS: 0 10*3/uL (ref 0.0–0.1)
BASOS PCT: 0 %
Eosinophils Absolute: 0.1 10*3/uL (ref 0.0–0.7)
Eosinophils Relative: 2 %
HEMATOCRIT: 19.9 % — AB (ref 39.0–52.0)
HEMOGLOBIN: 6.4 g/dL — AB (ref 13.0–17.0)
Lymphocytes Relative: 35 %
Lymphs Abs: 2.7 10*3/uL (ref 0.7–4.0)
MCH: 29.8 pg (ref 26.0–34.0)
MCHC: 32.2 g/dL (ref 30.0–36.0)
MCV: 92.6 fL (ref 78.0–100.0)
Monocytes Absolute: 0.4 10*3/uL (ref 0.1–1.0)
Monocytes Relative: 5 %
NEUTROS ABS: 4.4 10*3/uL (ref 1.7–7.7)
NEUTROS PCT: 58 %
Platelets: 448 10*3/uL — ABNORMAL HIGH (ref 150–400)
RBC: 2.15 MIL/uL — ABNORMAL LOW (ref 4.22–5.81)
RDW: 13.3 % (ref 11.5–15.5)
WBC: 7.6 10*3/uL (ref 4.0–10.5)

## 2016-10-03 LAB — POCT GLYCOSYLATED HEMOGLOBIN (HGB A1C): HEMOGLOBIN A1C: 7.4

## 2016-10-03 LAB — GLUCOSE, CAPILLARY
GLUCOSE-CAPILLARY: 267 mg/dL — AB (ref 65–99)
GLUCOSE-CAPILLARY: 205 mg/dL — AB (ref 65–99)

## 2016-10-03 LAB — ABO/RH: ABO/RH(D): O POS

## 2016-10-03 MED ORDER — INSULIN ASPART 100 UNIT/ML ~~LOC~~ SOLN
0.0000 [IU] | Freq: Every day | SUBCUTANEOUS | Status: DC
Start: 2016-10-03 — End: 2016-10-07
  Administered 2016-10-03 – 2016-10-05 (×2): 2 [IU] via SUBCUTANEOUS

## 2016-10-03 MED ORDER — PNEUMOCOCCAL VAC POLYVALENT 25 MCG/0.5ML IJ INJ
0.5000 mL | INJECTION | INTRAMUSCULAR | Status: AC
  Administered 2016-10-04: 0.5 mL via INTRAMUSCULAR
  Filled 2016-10-03: qty 0.5

## 2016-10-03 MED ORDER — BUDESONIDE 0.25 MG/2ML IN SUSP
0.2500 mg | Freq: Two times a day (BID) | RESPIRATORY_TRACT | Status: DC
  Administered 2016-10-04 – 2016-10-06 (×5): 0.25 mg via RESPIRATORY_TRACT
  Filled 2016-10-03 (×8): qty 2

## 2016-10-03 MED ORDER — PANTOPRAZOLE SODIUM 40 MG PO TBEC
40.0000 mg | DELAYED_RELEASE_TABLET | Freq: Every day | ORAL | Status: DC
  Administered 2016-10-04: 40 mg via ORAL
  Filled 2016-10-03: qty 1

## 2016-10-03 MED ORDER — INSULIN ASPART 100 UNIT/ML ~~LOC~~ SOLN
4.0000 [IU] | Freq: Three times a day (TID) | SUBCUTANEOUS | Status: DC
  Administered 2016-10-04 – 2016-10-06 (×4): 4 [IU] via SUBCUTANEOUS

## 2016-10-03 MED ORDER — POLYETHYLENE GLYCOL 3350 17 G PO PACK
17.0000 g | PACK | Freq: Every day | ORAL | Status: DC | PRN
  Administered 2016-10-05: 17 g via ORAL
  Filled 2016-10-03: qty 1

## 2016-10-03 MED ORDER — INFLUENZA VAC SPLIT QUAD 0.5 ML IM SUSY
0.5000 mL | PREFILLED_SYRINGE | INTRAMUSCULAR | Status: AC
  Administered 2016-10-04: 0.5 mL via INTRAMUSCULAR

## 2016-10-03 MED ORDER — CARVEDILOL 25 MG PO TABS
25.0000 mg | ORAL_TABLET | Freq: Two times a day (BID) | ORAL | Status: DC
  Administered 2016-10-03 – 2016-10-07 (×8): 25 mg via ORAL
  Filled 2016-10-03 (×9): qty 1

## 2016-10-03 MED ORDER — FUROSEMIDE 20 MG PO TABS: 20.0000 mg | ORAL_TABLET | Freq: Every day | ORAL | Status: DC

## 2016-10-03 MED ORDER — SODIUM CHLORIDE 0.9 % IV SOLN
Freq: Once | INTRAVENOUS | Status: AC
  Administered 2016-10-04: 01:00:00 via INTRAVENOUS

## 2016-10-03 MED ORDER — INSULIN ASPART 100 UNIT/ML ~~LOC~~ SOLN
0.0000 [IU] | Freq: Three times a day (TID) | SUBCUTANEOUS | Status: DC
  Administered 2016-10-04: 3 [IU] via SUBCUTANEOUS
  Administered 2016-10-04: 2 [IU] via SUBCUTANEOUS
  Administered 2016-10-05: 3 [IU] via SUBCUTANEOUS
  Administered 2016-10-06 (×2): 2 [IU] via SUBCUTANEOUS

## 2016-10-03 MED ORDER — ALBUTEROL SULFATE (2.5 MG/3ML) 0.083% IN NEBU: 2.5000 mg | INHALATION_SOLUTION | Freq: Four times a day (QID) | RESPIRATORY_TRACT | Status: DC | PRN

## 2016-10-03 MED ORDER — AMLODIPINE BESYLATE 10 MG PO TABS
10.0000 mg | ORAL_TABLET | Freq: Every day | ORAL | Status: DC
  Administered 2016-10-03 – 2016-10-07 (×5): 10 mg via ORAL
  Filled 2016-10-03 (×5): qty 1

## 2016-10-03 MED ORDER — INSULIN GLARGINE 100 UNIT/ML ~~LOC~~ SOLN
15.0000 [IU] | Freq: Two times a day (BID) | SUBCUTANEOUS | Status: DC
  Administered 2016-10-03 – 2016-10-06 (×5): 15 [IU] via SUBCUTANEOUS
  Filled 2016-10-03 (×10): qty 0.15

## 2016-10-03 MED ORDER — FUROSEMIDE 20 MG PO TABS
20.0000 mg | ORAL_TABLET | Freq: Every day | ORAL | Status: DC
  Administered 2016-10-04 – 2016-10-06 (×3): 20 mg via ORAL
  Filled 2016-10-03 (×3): qty 1

## 2016-10-03 MED ORDER — GABAPENTIN 400 MG PO CAPS
400.0000 mg | ORAL_CAPSULE | Freq: Three times a day (TID) | ORAL | Status: DC
  Administered 2016-10-03 – 2016-10-07 (×11): 400 mg via ORAL
  Filled 2016-10-03 (×12): qty 1

## 2016-10-03 NOTE — Assessment & Plan Note (Signed)
A: Worsening normocytic anemia in setting of chronic CKD.  Will check iron, TIBC, and ferritin.  P: - admit for transfusion of PRBCs

## 2016-10-03 NOTE — H&P (Signed)
Date: 10/03/2016               Patient Name:  Jeremy Yu MRN: [id number]  DOB: 1966-10-26 Age / Sex: 50 y.o., male   PCP: Jule Ser, DO         Medical Service: Internal Medicine Teaching Service         Attending Physician: Dr. Sid Falcon, MD    First Contact: Dr. Philipp Ovens Pager: [phone]  Second Contact: Dr. Juleen China Pager: [phone]       After Hours (After 5p/  First Contact Pager: [phone]  weekends / holidays): Second Contact Pager: [phone]   Chief Complaint: Fatigue  History of Present Illness: Jeremy Yu is a 50 year old male with PMHx Type I Diabetes uncontrolled with retinopathy, GERD, CKD stage IV, seizures  that presents to the [facility] Medicine Clinic this afternoon to establish care with a new provider.  During the encounter a Hgb A1C was attempted but could not be completed due to low hemoglobin.  An in office CBC showed a hemoglobin of 6.4.  Patient stated he has had fatigue that has progressed over the past 2 months.  He states he is sleeping more and not motivated to complete tasks because he feels a heaviness from being tired.   Associated symptoms include shortness of breath and dizziness he also reports melanotic stools.  He reports worsening of his GERD and has been without his Protonix for the past 2 months. He often feels bloated with epigastric pain.   He denies NSAID use. He denies chest pain or increase in his chronic leg swelling.        Meds:  Current Meds  Medication Sig  . albuterol (PROVENTIL HFA;VENTOLIN HFA) 108 (90 Base) MCG/ACT inhaler Inhale 1-2 puffs into the lungs every 6 (six) hours as needed for wheezing or shortness of breath.  Marland Kitchen amLODipine (NORVASC) 10 MG tablet Take 1 tablet (10 mg total) by mouth daily.  Marland Kitchen aspirin EC 81 MG tablet Take 1 tablet (81 mg total) by mouth daily.  Marland Kitchen atorvastatin (LIPITOR) 40 MG tablet TAKE ONE TABLET BY MOUTH ONCE DAILY  . beclomethasone (QVAR) 80 MCG/ACT inhaler Inhale 2 puffs into the  lungs 2 (two) times daily.  . furosemide (LASIX) 20 MG tablet TAKE ONE TABLET BY MOUTH ONCE DAILY  . gabapentin (NEURONTIN) 400 MG capsule TAKE ONE CAPSULE BY MOUTH THREE TIMES DAILY  . hydrALAZINE (APRESOLINE) 50 MG tablet Take 1 tablet (50 mg total) by mouth 3 (three) times daily.  . insulin aspart (NOVOLOG FLEXPEN) 100 UNIT/ML FlexPen Inject 10 Units into the skin 3 (three) times daily with meals.  . Insulin Glargine (LANTUS SOLOSTAR) 100 UNIT/ML Solostar Pen Inject 22 Units into the skin 2 (two) times daily at 8 am and 10 pm.  . lisinopril (PRINIVIL,ZESTRIL) 40 MG tablet Take 1 tablet (40 mg total) by mouth daily.  . nitroGLYCERIN (NITROSTAT) 0.4 MG SL tablet Place 1 tablet (0.4 mg total) under the tongue every 5 (five) minutes as needed for chest pain.  Marland Kitchen ondansetron (ZOFRAN) 4 MG tablet TAKE 1 TABLET (4 MG TOTAL) BY MOUTH ONCE AS NEEDED FOR NAUSEA OR VOMITING  . pantoprazole (PROTONIX) 40 MG tablet Take 1 tablet (40 mg total) by mouth daily.     Allergies: Allergies as of 10/03/2016 - Review Complete 10/03/2016  Allergen Reaction Noted  . Penicillins Shortness Of Breath and Itching 03/01/2015   Past Medical History:  Diagnosis Date  . Anxiety   .  Asthma   . Blind in both eyes   . Chronic bronchitis ([place])   . Chronic renal failure    Sees [facility]/notes 05/14/2016  . Daily headache   . Depression   . Diabetic retinopathy ([place])    Archie Endo 11/02/2015  . DVT (deep venous thrombosis) ([place])    "? side" (10/03/2016)  . Falls frequently    "last fall was yesterday" (10/03/2016)  . Fibromyalgia   . GERD (gastroesophageal reflux disease)   . History of blood transfusion   . History of stomach ulcers   . Hyperlipidemia   . Hypertension   . Migraine    "weekly" (10/03/2016)  . Nephropathy    Archie Endo 11/02/2015  . Peripheral neuropathy ([place])    Archie Endo 11/02/2015  . Seizures ([place])    "light; none in the last 2 months; probably have 3/year" (10/03/2016)  . Symptomatic  anemia 10/03/2016  . Type I diabetes mellitus ([place]) dx'd ~ 1987   Archie Endo 11/02/2015    Family History: Mother: CHF and diabetes.  Father:  CHF and diabetes  Social History: Tobacco use:  Never smoker Alcohol use: Denies Illicit drug use: Denies  Review of Systems: A complete ROS was negative except as per HPI.   Physical Exam: Blood pressure (!) 183/106, pulse 92, temperature 98.4 F (36.9 C), temperature source Oral, resp. rate 19, height $RemoveBe'6\' 1"'yAjpPjRJh$  (1.854 m), weight 248 lb 3.8 oz (112.6 kg), SpO2 100 %. Vitals:   10/03/16 1831 10/03/16 2133  BP: (!) 195/103 (!) 183/106  Pulse: 99 92  Resp: 18 19  Temp:  98.4 F (36.9 C)  TempSrc: Oral Oral  SpO2: 100% 100%  Weight: 248 lb 14.4 oz (112.9 kg) 248 lb 3.8 oz (112.6 kg)  Height: $Remove'6\' 1"'qbLdCHl$  (1.854 m)    General: Vital signs reviewed.  Patient is well-developed and well-nourished, in no acute distress and cooperative with exam.  Head: Normocephalic and atraumatic. Eyes: EOMI, conjunctivae normal, no scleral icterus. Blind Neck: Supple, trachea midline, normal ROM.  Cardiovascular: Tachycardic, regular rhythym, S1 normal, S2 normal, no murmurs, gallops, or rubs. Pulmonary/Chest: Clear to auscultation bilaterally, no wheezes, rales, or rhonchi. Abdominal: Soft, epigastric tenderness and RLQ tenderness, non-distended, BS +, no masses, organomegaly, or guarding present.  Musculoskeletal: No joint deformities, erythema, or stiffness, ROM full and nontender. Extremities: 2+ pitting edema bilaterally,  pulses symmetric and intact bilaterally. No cyanosis or clubbing. Neurological: A&O x3, Strength is normal and symmetric bilaterally, no focal motor deficit, sensory intact to light touch bilaterally.  Skin: Warm, dry and intact. No rashes or erythema. Psychiatric: Normal mood and affect. speech and behavior is normal. Cognition and memory are normal.   BMET    Component Value Date/Time   NA 141 10/03/2016 1554   NA 138 03/28/2016 1356   K 3.9  10/03/2016 1554   CL 110 10/03/2016 1554   CO2 25 10/03/2016 1554   GLUCOSE 296 (H) 10/03/2016 1554   BUN 19 10/03/2016 1554   BUN 26 (H) 03/28/2016 1356   CREATININE 3.48 (H) 10/03/2016 1554   CALCIUM 8.3 (L) 10/03/2016 1554   GFRNONAA 19 (L) 10/03/2016 1554   GFRAA 22 (L) 10/03/2016 1554   CBC    Component Value Date/Time   WBC 7.6 10/03/2016 1554   RBC 2.15 (L) 10/03/2016 1554   HGB 6.4 (LL) 10/03/2016 1554   HCT 19.9 (L) 10/03/2016 1554   HCT 35.1 (L) 10/19/2015 1559   PLT 448 (H) 10/03/2016 1554   PLT 406 (H) 10/19/2015 1559  MCV 92.6 10/03/2016 1554   MCV 92 10/19/2015 1559   MCH 29.8 10/03/2016 1554   MCHC 32.2 10/03/2016 1554   RDW 13.3 10/03/2016 1554   RDW 13.1 10/19/2015 1559   LYMPHSABS 2.7 10/03/2016 1554   LYMPHSABS 2.8 10/19/2015 1559   MONOABS 0.4 10/03/2016 1554   EOSABS 0.1 10/03/2016 1554   EOSABS 0.1 10/19/2015 1559   BASOSABS 0.0 10/03/2016 1554   BASOSABS 0.0 10/19/2015 1559    EKG: None  CXR:  Chest X-ray Findings: Mediastinal contours are unchanged. No pulmonary edema or focal airspace disease. No pleural fluid or pneumothorax. No acute osseous abnormality.  IMPRESSION: Enlargement of the cardiac silhouette, may be accentuated by AP technique. No localizing process or congestive failure.  Assessment & Plan by Problem: Symptomatic anemia Patient has had 2 month worsening fatigue and dyspnea on exertion.  Patient has been out of his protonix for 2 months and has had worsening GERD symptoms.  With a 2 month history of melanotic stools there is concern that this is an upper GI bleed, possibly from an ulcer.  Patient has a hemoglobin of 6.4 that has decreased slowly over the past year.  He will be transfused with 2 units of PRBCs.  There is no concern for overt bleeding as patient's blood pressures are elevated, BUN is normal and patient does not complain of acute shortness of breath or chest pain.  Orthostatics were also negative.  Consider  consulting GI in the morning for endoscopy. - Transfuse 2 units of PRBC - Post transfusion H & H - CBC in the morning    HTN (hypertension) His blood pressures have been elevated 183/106.  He has been out of his lisinopril and hydralazine for the past 2 months.  In addition to those medications he is taking Coreg $RemoveBefo'25mg'DJBpZwSpOqy$  TID, Amlodipine $RemoveBeforeD'10mg'OzfSNVnVeYBSgq$  daily and furosemide $RemoveBefore'20mg'xcBBeeZEQZGyu$  daily.  Due to worsening CKD will hold lisinopril. - Continue  Amlodipine, Coreg, Lasix  Chronic Kidney Disease, Stage IV Patient continues to have worsening renal function in the setting of his uncontrolled DM and HTN.  He has normocytic anemia and is likely worsened by his CKD along with blood loss.  Inpatient nephrology consult may be beneficial to assist with his CKD and anemia. - Iron, TIBC and ferritin - BMET in the morning  Hyperlipidemia associated with type 2 diabetes mellitus Lipid panel 09/2015 revealed a total cholesterol of 484, HDL 58 and LDL unable to be calculated due to TG of 986.  He is on atorvastatin but has been out of this for 2 months.  Will need refills upon discharge.   - Restart atorvastatin   Type I Diabetes, uncontrolled, with retinopathy  Hgb A1C was 7.4 today.  He is on Lantus 22units BID and Novolog 10units TID.  Patient needs refills on his testing supplies upon discharge.   - Novolog 4 units TIB with meals - Lantus 15 units subcutaneous - SSI  GERD Uncontrolled for the past several months as he has not had assess to his medications.   - Restart protonix  Diastolic Congestive Heart Failure Patient has a LV EF of 60-65% with grade II diastolic dysfunction likely due to uncontrolled hypertension.  Patient has 2+ pitting edema bilaterally and is not complaining of acute shortness of breath.   - Continue Lasix $RemoveBefore'20mg'hcupeiBHtEPek$    Asthma, moderate persistent - Continue Pulmicort BID  - Proventil Q6H PRN  Dispo: Admit patient to Observation with expected length of stay less than 2  midnights.  Signed: Elza Rafter  [place], DO 10/03/2016, 10:45 PM  Pager: [phone]

## 2016-10-03 NOTE — Assessment & Plan Note (Signed)
A: Patient continues to have worsening renal function in the setting of his uncontrolled DM and HTN.  From chart review, it unfortunately appears that he has missed a couple of appointments with [facility].  We discussed briefly the need for nephrology follow up going forward given his CKD and he expressed understanding.  We discussed potential referral to [facility] for nephrology as he has been unable to follow up with [facility] up to this point.  He stated that it would be easier for him to follow up here in [place] and so will attempt to follow up there.  P: - his worsening normocytic anemia likely reflects some blood loss as well as worsening anemia of CKD - nephrology consult while inpatient may be beneficial in regards to assistance with his CKD, anemia, and metabolic bone disease.

## 2016-10-03 NOTE — Patient Instructions (Signed)
We have had to admit you to the hospital for further management.

## 2016-10-03 NOTE — Assessment & Plan Note (Signed)
A: Last year, his lipid panel revealed a total cholesterol of 484, HDL 58 and LDL unable to be calculated due to TG of 986.  He is on atorvastatin but has been out of this for 2 months and will need to be prescribed upon hospital discharge.  P: - continue lipitor  - check fasting lipid panel in the hospital

## 2016-10-03 NOTE — Progress Notes (Signed)
CC: here today with HTN and DM follow up  HPI:  [name] is a 50 y.o. man with a past medical history listed below here today to establish care with me as his new PCP and for follow up of his HTN and DM.  He has been without most of his medications for 2 months and has not been checking his blood sugars at home.  During attempt to check his A1C, it was unable to be read due to low hemoglobin.  Review of his prior results indicate hemoglobin of 11.2 in Oct 2016 and 8.4 in March 2017.  No further evaluation after that.  Patient reports a history of melena in the past but has had no endoscopic evaluation.  He states he thinks he was supposed to have a colonoscopy at some point in the past but this did not happen for reasons that are not entirely clear from the patients recollection of events.  He was evaluated about 1 year ago for his melena history and given stool cards which were returned negative x 3.  His melena had resolved.  Today, he is reporting about 2-3 months of stool that is "darker than normal" and occasional red blood.  He also reports worsening GERD-like symptoms and chronic cough after having been without his Protonix for the past couple months.  He further reports occasional coughing up blood-streaked sputum but does not report any hematemesis or emesis that is coffee-ground like.  Other pertinent ROS includes worsening DOE, occasional chest pain while exerting himself, and feeling lightheaded at times.  He also reports feeling very fatigued.  He has been evaluated for his chest pain earlier in 2017 with a stress test that was low-risk.  These symptoms have coincided with the increased melena.  He denies use of NSAIDs.  He reports night sweats over the past few nights and weight loss.  His weight today is 249 pounds.  It was 274 in March 2017 but in November 2016 it was 246 so this fluctuation in weight may be better explained by his diuretic management for lower extremity  edema.  For details of today's visit and the status of his chronic medical issues please refer to the assessment and plan.    Past Medical History:  Diagnosis Date  . Anxiety   . Asthma   . Blind in both eyes   . Chronic bronchitis ([place])   . Chronic renal failure    Sees [facility]/notes 05/14/2016  . Daily headache   . Depression   . Diabetic retinopathy ([place])    Jeremy Yu 11/02/2015  . DVT (deep venous thrombosis) ([place])    "? side" (10/03/2016)  . Falls frequently    "last fall was yesterday" (10/03/2016)  . Fibromyalgia   . GERD (gastroesophageal reflux disease)   . History of blood transfusion   . History of stomach ulcers   . Hyperlipidemia   . Hypertension   . Migraine    "weekly" (10/03/2016)  . Nephropathy    Jeremy Yu 11/02/2015  . Peripheral neuropathy ([place])    Jeremy Yu 11/02/2015  . Seizures ([place])    "light; none in the last 2 months; probably have 3/year" (10/03/2016)  . Symptomatic anemia 10/03/2016  . Type I diabetes mellitus ([place]) dx'd ~ 1987   /notes 11/02/2015    Review of Systems:   Please see pertinent ROS reviewed in HPI and problem based charting.   Physical Exam:  Vitals:   10/03/16 1501  BP: (!) 174/90  Pulse: 94  Temp: 97.9 F (36.6 C)  TempSrc: Oral  SpO2: 100%  Weight: 249 lb 14.4 oz (113.4 kg)  Height: 6\' 1"  (1.854 m)   Physical Exam  Constitutional: He is oriented to person, place, and time.  Pleasant, AA man, not in distress.  HENT:  Head: Normocephalic and atraumatic.  Cardiovascular: Normal rate, regular rhythm and normal heart sounds.   No murmur heard. Pulmonary/Chest: Effort normal and breath sounds normal. He has no wheezes. He has no rales.  Abdominal: Soft. Bowel sounds are normal. There is no rebound.  Mild tenderness epigastric.  Neurological: He is alert and oriented to person, place, and time.  Skin: Skin is warm and dry.  Psychiatric: Mood and affect normal.     Assessment & Plan:   See Encounters Tab for  problem based charting.  Patient discussed with Dr. Lynnae January.  Symptomatic anemia A: Patient presenting with 2-3 months of melena and occasional bright red blood in his stool.  Stat labs revealed a hemoglobin of 6.4 which was a decrease from 8.4 in March of 2017.  He denies NSAID use but has been out of his PPI for the past 2 months and reports a gnawing epigastric pain in addition to increased fatigue, intermittent chest pain, DOE, and feeling lightheaded.  He is hemodynamically stable with a SBP in the 170s and negative orthostatic vital signs.  BMET shows worsening renal function with creatinine rising but BUN is not elevated.  P: - admit to med-surg, discussed with residency team.  Patient has bed available on [place] [place] - maintain IV access - recommend transfuse 2 units PRBC and post-transfusion H & H - PPI - consider GI consult in the morning to determine need for endoscopy - iron, TIBC, and ferritin pending  HTN (hypertension) BP Readings from Last 3 Encounters:  10/03/16 (!) 174/90  06/21/16 116/63  05/22/16 (!) 152/80   A:  His blood pressure is elevated today which is actually more reassuring given his symptomatic anemia that he is not hemodynamically unstable.  Current medications include: Lisinopril 40mg  daily, Hydralazine 50mg  TID, Coreg 25mg  TID, and Amlodipine 10mg  daily.  He is also on 20mg  daily of Furosemide.  He has been without his Lisinopril and Hydralazine for the past 2 months.  P: - would recommend holding his lisinopril in the future given his worsening CKD - otherwise, further management of his BP will be deferred to the inpatient team  Hyperlipidemia associated with type 2 diabetes mellitus ([place]) A: Last year, his lipid panel revealed a total cholesterol of 484, HDL 58 and LDL unable to be calculated due to TG of 986.  He is on atorvastatin but has been out of this for 2 months and will need to be prescribed upon hospital discharge.  P: - continue lipitor  -  check fasting lipid panel in the hospital  DM type 2, uncontrolled, with retinopathy ([place]) Lab Results  Component Value Date   HGBA1C 7.4 10/03/2016   A: He is on Lantus 22units BID and Novolog 10units TID.  He did not bring a glucometer today.  A1C able to be obtained was 7.4.  He states he needs a new glucometer and test strips.  P: - order new testing supplies at hospital discharge - continue current management as difficult to make changes without glucometer.  Will have patient follow up shortly after hospital discharge with his meter.  Chronic kidney disease A: Patient continues to have worsening renal function in the setting of his uncontrolled DM and  HTN.  From chart review, it unfortunately appears that he has missed a couple of appointments with [facility].  We discussed briefly the need for nephrology follow up going forward given his CKD and he expressed understanding.  We discussed potential referral to [facility] for nephrology as he has been unable to follow up with [facility] up to this point.  He stated that it would be easier for him to follow up here in [place] and so will attempt to follow up there.  P: - his worsening normocytic anemia likely reflects some blood loss as well as worsening anemia of CKD - nephrology consult while inpatient may be beneficial in regards to assistance with his CKD, anemia, and metabolic bone disease.  Anemia of chronic kidney failure, stage 4 (severe) (H[facility]) A: Worsening normocytic anemia in setting of chronic CKD.  Will check iron, TIBC, and ferritin.  P: - admit for transfusion of PRBCs

## 2016-10-03 NOTE — Assessment & Plan Note (Signed)
BP Readings from Last 3 Encounters:  10/03/16 (!) 174/90  06/21/16 116/63  05/22/16 (!) 152/80   A:  His blood pressure is elevated today which is actually more reassuring given his symptomatic anemia that he is not hemodynamically unstable.  Current medications include: Lisinopril 40mg  daily, Hydralazine 50mg  TID, Coreg 25mg  TID, and Amlodipine 10mg  daily.  He is also on 20mg  daily of Furosemide.  He has been without his Lisinopril and Hydralazine for the past 2 months.  P: - would recommend holding his lisinopril in the future given his worsening CKD - otherwise, further management of his BP will be deferred to the inpatient team

## 2016-10-03 NOTE — Assessment & Plan Note (Signed)
Lab Results  Component Value Date   HGBA1C 7.4 10/03/2016   A: He is on Lantus 22units BID and Novolog 10units TID.  He did not bring a glucometer today.  A1C able to be obtained was 7.4.  He states he needs a new glucometer and test strips.  P: - order new testing supplies at hospital discharge - continue current management as difficult to make changes without glucometer.  Will have patient follow up shortly after hospital discharge with his meter.

## 2016-10-03 NOTE — Progress Notes (Signed)
New Admission Note:    Arrival Method: Wheelchair Mental Orientation: Alert and Oriented x 4 Assessment:  In progress Skin: WNL IV: IV Consult Pain:0/10 Safety Measures: Bed rails, call bell Admission: Paged Admission Nurse 6E Orientation: Oriented pt to Unit Family:  At bedside  Orders have been reviewed and implemented.  Will continue to monitor the patient.

## 2016-10-03 NOTE — Progress Notes (Signed)
Patient needs order to type and screen for blood bank to draw blood. On call MD notified.Order received in epic. Iyonna Rish, Wonda Cheng, Therapist, sports

## 2016-10-03 NOTE — Assessment & Plan Note (Signed)
A: Patient presenting with 2-3 months of melena and occasional bright red blood in his stool.  Stat labs revealed a hemoglobin of 6.4 which was a decrease from 8.4 in March of 2017.  He denies NSAID use but has been out of his PPI for the past 2 months and reports a gnawing epigastric pain in addition to increased fatigue, intermittent chest pain, DOE, and feeling lightheaded.  He is hemodynamically stable with a SBP in the 170s and negative orthostatic vital signs.  BMET shows worsening renal function with creatinine rising but BUN is not elevated.  P: - admit to med-surg, discussed with residency team.  Patient has bed available on [address] - maintain IV access - recommend transfuse 2 units PRBC and post-transfusion H & H - PPI - consider GI consult in the morning to determine need for endoscopy - iron, TIBC, and ferritin pending

## 2016-10-04 DIAGNOSIS — Z7982 Long term (current) use of aspirin: Secondary | ICD-10-CM | POA: Diagnosis not present

## 2016-10-04 DIAGNOSIS — E1065 Type 1 diabetes mellitus with hyperglycemia: Secondary | ICD-10-CM | POA: Diagnosis present

## 2016-10-04 DIAGNOSIS — J4540 Moderate persistent asthma, uncomplicated: Secondary | ICD-10-CM | POA: Diagnosis present

## 2016-10-04 DIAGNOSIS — Z87891 Personal history of nicotine dependence: Secondary | ICD-10-CM | POA: Diagnosis not present

## 2016-10-04 DIAGNOSIS — R51 Headache: Secondary | ICD-10-CM | POA: Diagnosis not present

## 2016-10-04 DIAGNOSIS — D649 Anemia, unspecified: Secondary | ICD-10-CM | POA: Diagnosis not present

## 2016-10-04 DIAGNOSIS — Z8711 Personal history of peptic ulcer disease: Secondary | ICD-10-CM | POA: Diagnosis not present

## 2016-10-04 DIAGNOSIS — K648 Other hemorrhoids: Secondary | ICD-10-CM | POA: Diagnosis present

## 2016-10-04 DIAGNOSIS — I130 Hypertensive heart and chronic kidney disease with heart failure and stage 1 through stage 4 chronic kidney disease, or unspecified chronic kidney disease: Secondary | ICD-10-CM | POA: Diagnosis present

## 2016-10-04 DIAGNOSIS — N179 Acute kidney failure, unspecified: Secondary | ICD-10-CM | POA: Diagnosis present

## 2016-10-04 DIAGNOSIS — E1022 Type 1 diabetes mellitus with diabetic chronic kidney disease: Secondary | ICD-10-CM | POA: Diagnosis present

## 2016-10-04 DIAGNOSIS — E1040 Type 1 diabetes mellitus with diabetic neuropathy, unspecified: Secondary | ICD-10-CM | POA: Diagnosis present

## 2016-10-04 DIAGNOSIS — E10319 Type 1 diabetes mellitus with unspecified diabetic retinopathy without macular edema: Secondary | ICD-10-CM | POA: Diagnosis present

## 2016-10-04 DIAGNOSIS — R0602 Shortness of breath: Secondary | ICD-10-CM | POA: Diagnosis present

## 2016-10-04 DIAGNOSIS — E669 Obesity, unspecified: Secondary | ICD-10-CM | POA: Diagnosis present

## 2016-10-04 DIAGNOSIS — K921 Melena: Secondary | ICD-10-CM | POA: Diagnosis present

## 2016-10-04 DIAGNOSIS — N184 Chronic kidney disease, stage 4 (severe): Secondary | ICD-10-CM | POA: Diagnosis present

## 2016-10-04 DIAGNOSIS — K5900 Constipation, unspecified: Secondary | ICD-10-CM | POA: Diagnosis present

## 2016-10-04 DIAGNOSIS — H548 Legal blindness, as defined in USA: Secondary | ICD-10-CM | POA: Diagnosis present

## 2016-10-04 DIAGNOSIS — Z6832 Body mass index (BMI) 32.0-32.9, adult: Secondary | ICD-10-CM | POA: Diagnosis not present

## 2016-10-04 DIAGNOSIS — K219 Gastro-esophageal reflux disease without esophagitis: Secondary | ICD-10-CM | POA: Diagnosis present

## 2016-10-04 DIAGNOSIS — K649 Unspecified hemorrhoids: Secondary | ICD-10-CM | POA: Diagnosis not present

## 2016-10-04 DIAGNOSIS — Z23 Encounter for immunization: Secondary | ICD-10-CM | POA: Diagnosis not present

## 2016-10-04 DIAGNOSIS — E785 Hyperlipidemia, unspecified: Secondary | ICD-10-CM | POA: Diagnosis present

## 2016-10-04 DIAGNOSIS — I5032 Chronic diastolic (congestive) heart failure: Secondary | ICD-10-CM | POA: Diagnosis present

## 2016-10-04 DIAGNOSIS — R710 Precipitous drop in hematocrit: Secondary | ICD-10-CM

## 2016-10-04 DIAGNOSIS — K5730 Diverticulosis of large intestine without perforation or abscess without bleeding: Secondary | ICD-10-CM | POA: Diagnosis present

## 2016-10-04 DIAGNOSIS — Z794 Long term (current) use of insulin: Secondary | ICD-10-CM | POA: Diagnosis not present

## 2016-10-04 DIAGNOSIS — Z7951 Long term (current) use of inhaled steroids: Secondary | ICD-10-CM | POA: Diagnosis not present

## 2016-10-04 LAB — CBC
HCT: 23.2 % — ABNORMAL LOW (ref 39.0–52.0)
HEMATOCRIT: 22.4 % — AB (ref 39.0–52.0)
Hemoglobin: 7.5 g/dL — ABNORMAL LOW (ref 13.0–17.0)
Hemoglobin: 7.3 g/dL — ABNORMAL LOW (ref 13.0–17.0)
MCH: 29.2 pg (ref 26.0–34.0)
MCH: 29.6 pg (ref 26.0–34.0)
MCHC: 32.3 g/dL (ref 30.0–36.0)
MCHC: 32.6 g/dL (ref 30.0–36.0)
MCV: 90.3 fL (ref 78.0–100.0)
MCV: 90.7 fL (ref 78.0–100.0)
PLATELETS: 404 10*3/uL — AB (ref 150–400)
Platelets: 396 10*3/uL (ref 150–400)
RBC: 2.57 MIL/uL — AB (ref 4.22–5.81)
RBC: 2.47 MIL/uL — ABNORMAL LOW (ref 4.22–5.81)
RDW: 14.3 % (ref 11.5–15.5)
RDW: 14.7 % (ref 11.5–15.5)
WBC: 7.6 10*3/uL (ref 4.0–10.5)
WBC: 7.9 10*3/uL (ref 4.0–10.5)

## 2016-10-04 LAB — GLUCOSE, CAPILLARY
GLUCOSE-CAPILLARY: 146 mg/dL — AB (ref 65–99)
Glucose-Capillary: 135 mg/dL — ABNORMAL HIGH (ref 65–99)
Glucose-Capillary: 165 mg/dL — ABNORMAL HIGH (ref 65–99)
Glucose-Capillary: 135 mg/dL — ABNORMAL HIGH (ref 65–99)

## 2016-10-04 LAB — HEPATIC FUNCTION PANEL
ALT: 12 U/L — AB (ref 17–63)
AST: 23 U/L (ref 15–41)
Albumin: 2.5 g/dL — ABNORMAL LOW (ref 3.5–5.0)
Alkaline Phosphatase: 93 U/L (ref 38–126)
TOTAL PROTEIN: 5.9 g/dL — AB (ref 6.5–8.1)
Total Bilirubin: 0.4 mg/dL (ref 0.3–1.2)

## 2016-10-04 LAB — LIPID PANEL
CHOL/HDL RATIO: 5.5 ratio
CHOLESTEROL: 219 mg/dL — AB (ref 0–200)
HDL: 40 mg/dL — ABNORMAL LOW (ref >40)
LDL Cholesterol: 115 mg/dL — ABNORMAL HIGH (ref 0–99)
Triglycerides: 321 mg/dL — ABNORMAL HIGH (ref <150)
VLDL: 64 mg/dL — ABNORMAL HIGH (ref 0–40)

## 2016-10-04 LAB — IRON AND TIBC
IRON SATURATION: 21 % (ref 15–55)
Iron: 50 ug/dL (ref 38–169)
TIBC: 233 ug/dL — AB (ref 250–450)
UIBC: 183 ug/dL (ref 111–343)

## 2016-10-04 LAB — PREPARE RBC (CROSSMATCH)

## 2016-10-04 LAB — BASIC METABOLIC PANEL
Anion gap: 6 (ref 5–15)
BUN: 16 mg/dL (ref 6–20)
CALCIUM: 8.4 mg/dL — AB (ref 8.9–10.3)
CHLORIDE: 112 mmol/L — AB (ref 101–111)
CO2: 24 mmol/L (ref 22–32)
CREATININE: 3.13 mg/dL — AB (ref 0.61–1.24)
GFR calc non Af Amer: 22 mL/min — ABNORMAL LOW (ref >60)
GFR, EST AFRICAN AMERICAN: 25 mL/min — AB (ref >60)
GLUCOSE: 163 mg/dL — AB (ref 65–99)
Potassium: 3.6 mmol/L (ref 3.5–5.1)
Sodium: 142 mmol/L (ref 135–145)

## 2016-10-04 LAB — FERRITIN: Ferritin: 269 ng/mL (ref 30–400)

## 2016-10-04 MED ORDER — PANTOPRAZOLE SODIUM 40 MG PO TBEC
40.0000 mg | DELAYED_RELEASE_TABLET | Freq: Every day | ORAL | Status: DC
  Administered 2016-10-05 – 2016-10-07 (×3): 40 mg via ORAL
  Filled 2016-10-04 (×4): qty 1

## 2016-10-04 MED ORDER — ATORVASTATIN CALCIUM 40 MG PO TABS
40.0000 mg | ORAL_TABLET | Freq: Every day | ORAL | Status: DC
  Administered 2016-10-04 – 2016-10-06 (×3): 40 mg via ORAL
  Filled 2016-10-04 (×3): qty 1

## 2016-10-04 MED ORDER — ACETAMINOPHEN 325 MG PO TABS
650.0000 mg | ORAL_TABLET | Freq: Four times a day (QID) | ORAL | Status: DC | PRN
  Administered 2016-10-04: 650 mg via ORAL
  Filled 2016-10-04: qty 2

## 2016-10-04 MED ORDER — FAMOTIDINE IN NACL 20-0.9 MG/50ML-% IV SOLN
20.0000 mg | Freq: Two times a day (BID) | INTRAVENOUS | Status: DC
Start: 2016-10-04 — End: 2016-10-04
  Administered 2016-10-04: 20 mg via INTRAVENOUS
  Filled 2016-10-04 (×2): qty 50

## 2016-10-04 MED ORDER — TRAMADOL HCL 50 MG PO TABS
50.0000 mg | ORAL_TABLET | Freq: Two times a day (BID) | ORAL | Status: DC | PRN
  Administered 2016-10-04: 50 mg via ORAL
  Filled 2016-10-04: qty 1

## 2016-10-04 NOTE — Clinical Social Work Note (Signed)
CSW received consult 10/12: out of meds for 3 months, type I DM, need inhalers. CSW signing off as inappropriate consult. Nurse case manager advised on 10/13.  Stpehanie Montroy Givens, MSW, LCSW Licensed Clinical Social Worker [place] [phone]

## 2016-10-04 NOTE — Progress Notes (Signed)
Subjective: Patient was seen and examined this morning on rounds.  He reports feeling better overall since being admitted and receiving 2 units of PRBCs.  He is complaining of his neuropathy pain in his lower extremities.  He also complains of right sided abdominal and epigastric pain.  He has not had a bowel movement since Monday.  Objective:  Vital signs in last 24 hours: Vitals:   10/04/16 0636 10/04/16 0853 10/04/16 0938 10/04/16 1001  BP: (!) 146/75 (!) 154/87  (!) 158/85  Pulse: 91 94    Resp: 16 16    Temp: 98.7 F (37.1 C) 98.4 F (36.9 C)    TempSrc: Oral Oral    SpO2: 99% 100% 94%   Weight:      Height:       General: resting in bed, pleasant, not distressed HEENT: [place]/AT Cardiac: RRR, no rubs, murmurs or gallops Pulm: clear to auscultation bilaterally, moving normal volumes of air Abd: soft, some right sided tenderness without rebound or guarding Ext: warm and well perfused, 2+ pedal edema bilaterally, SCDs on lower extremities Neuro: alert and oriented X3, cranial nerves II-XII grossly intact   Assessment/Plan:  Principal Problem:   Symptomatic anemia Active Problems:   HTN (hypertension)   Asthma, moderate persistent   Anemia  Symptomatic anemia Patient admitted directly from clinic yesterday evening after experiencing 2 months of worsening fatigue, DOE, and melena in his stools.  He has also been without his PPI during this time and has had worsening GERD symptoms.  CBC on admission 6.4 improved to 7.5 after 2 units of PRBCs.  He likely has some acute worsening of his anemia of CKD.  Ferritin was unexpectedly normal if this were attributable solely to his CKD.  Iron level was low-normal at 50 and iron saturation low-normal at 21%.  BUN is not elevated.  Given his abdominal pain, we also checked a hepatic panel which did not show elevation of his liver enzymes and bilirubin was normal.  Likely related to his constipation for which Miralax is ordered PRN. - repeat  CBC this afternoon, transfuse for hemoglobin < 7.0 - GI consulted this morning to determine any need for inpatient endoscopic evaluation - PPI  HTN (hypertension) His blood pressures are still elevated today at 158/85.  He has been out of his lisinopril and hydralazine for the past 2 months.  In addition to those medications he is taking Coreg $RemoveBefo'25mg'EbhnpspUzAK$  TID, Amlodipine $RemoveBeforeD'10mg'qScpOsFUAvGyHy$  daily and furosemide $RemoveBefore'20mg'TggVnxPwWwSua$  daily.  - Hold Lisinopril given worsening CKD - Continue  Amlodipine, Coreg, Lasix - Consider adding back Hydralazine as BP tolerates for consistent SBP > 160  Chronic Kidney Disease, Stage IV Patient continues to have worsening renal function in the setting of his uncontrolled DM and HTN.  He has normocytic anemia and is likely worsened by his CKD along with blood loss. He needs to follow up with nephrology as has been planned as an outpatient since will likely benefit from EPO. - Iron study results as above - Renal function panel in the morning  Hyperlipidemia associated with type 2 diabetes mellitus Lipid panel 10/04/2016 with TC 219, TG 321, HDL 40, LDL 115. He is on atorvastatin but has been out of this for 2 months.  Will need refills upon discharge.   - Resume atorvastatin   Type I Diabetes, uncontrolled, with retinopathy  Hgb A1C was 7.4 on 10/03/2016.  He is on Lantus 22units BID and Novolog 10units TID. Patient needs refills on his testing supplies upon  discharge.   - CBGs look okay so far - Novolog 4 units TID with meals  - Lantus 15 units subcutaneous  - SSI  GERD Uncontrolled for the past several months as he has not had assess to his medications.   - PPI  Diastolic Congestive Heart Failure Patient has a LV EF of 60-65% with grade II diastolic dysfunction likely due to uncontrolled hypertension.  Patient has 2+ pitting edema bilaterally and is not complaining of acute shortness of breath.   - Continue Lasix $RemoveBefore'20mg'WEUIMBtCrgpGD$    Asthma, moderate persistent - Continue Pulmicort BID  -  Proventil Q6H PRN  DVT PPx: SCD  Diet: Carb mod  Dispo: Anticipated discharge in approximately 1-2 day(s).   Velna Ochs, MD 10/04/2016, 2:16 PM Pager: [phone]

## 2016-10-04 NOTE — Progress Notes (Addendum)
Inpatient Diabetes Program Recommendations  AACE/ADA: New Consensus Statement on Inpatient Glycemic Control (2015)  Target Ranges:  Prepandial:   less than 140 mg/dL      Peak postprandial:   less than 180 mg/dL (1-2 hours)      Critically ill patients:  140 - 180 mg/dL   Lab Results  Component Value Date   GLUCAP 135 (H) 10/04/2016   HGBA1C 7.4 10/03/2016    Review of Glycemic Control  Results for Jeremy Yu, Jeremy Yu (MRN [id number]) as of 10/04/2016 12:46  Ref. Range 10/03/2016 15:20 10/03/2016 21:37 10/04/2016 07:27 10/04/2016 12:09  Glucose-Capillary Latest Ref Range: 65 - 99 mg/dL 267 (H) 205 (H) 146 (H) 135 (H)    Diabetes history: Type 1 (from history) Outpatient Diabetes medications: Novolog 10 units tid, Lantus 22 units bid  Current orders for Inpatient glycemic control: Lantus 15 units bid, Novolog 4units tid, Novolog 0-15 units tid, Novolog 0-5 units qhs  Inpatient Diabetes Program Recommendations:  Noted that the patient has an order for NPO and that the Novolog insulin (correction and mealtime) was given @ 10am.  Please d/c Novolog 4 units tid with meal order while the patient is NPO.   With this patient's poor renal function, consider decreasing Novolog correction to sensitive correction q 6 h.   Once the patient is able to eat again, restart mealtime Novolog 4 units tid and continue Novolog sensitive correction but change it to tid.  Gentry Fitz, RN, BA, MHA, CDE Diabetes Coordinator Inpatient Diabetes Program  [phone] (Team Pager) [phone] ([place]) 10/04/2016 1:04 PM

## 2016-10-04 NOTE — Progress Notes (Signed)
Internal Medicine Clinic Attending  Case discussed with Dr. Juleen China at the time of the visit.  We reviewed the resident's history and exam and pertinent patient test results.  I agree with the assessment, diagnosis, and plan of care documented in the resident's note. I agree with Dr Alcario Drought decision to admit Mr [place].

## 2016-10-04 NOTE — Progress Notes (Signed)
Reported to resident that pt is NPO but has insulin orders. Advised to hold it.   Reported that crt is 3.13.   Will continue to monitor pt.   Paulla Fore, RN

## 2016-10-04 NOTE — Progress Notes (Signed)
   10/04/16 0900  Clinical Encounter Type  Visited With Patient  Visit Type Initial;Spiritual support  Referral From Physician  Spiritual Encounters  Spiritual Needs Prayer;Emotional  Stress Factors  Patient Stress Factors None identified    Chaplain prayed with patient and offered ministry of presence. Pt indicate feelings of peace.

## 2016-10-04 NOTE — Consult Note (Signed)
[facility] Consult: 1:11 PM 10/04/2016  LOS: 1 day    Referring Provider: Dr Daryll Drown.  Primary Care Physician:  Jule Ser, DO Primary Gastroenterologist:  unassigned     [facility] Attending   I have taken an interval history, reviewed the chart and examined the patient. I agree with the Advanced Practitioner's note, impression and recommendations.   GI bleeding - subacute? Chronic? Acute Multifactorial anemia seems likely but hx of melena and decline in Hgb  Start w/u with EGD tomorrow - if that is unrevealing colonoscopy next The risks and benefits as well as alternatives of endoscopic procedure(s) have been discussed and reviewed. All questions answered. The patient agrees to proceed.  [name], MD, [facility] [phone] (pager) [phone] after 5 PM, weekends and holidays  10/04/2016 7:00 PM    IMPRESSION:   *  FOBT + anemia.  Patient is not iron deficient. I suspect the bulk of his anemia is due to his late stage chronic kidney disease. He may need to initiate Neupogen therapy. Has history of bleeding peptic ulcer disease and hasn't been taking PPI for at least 3 months. Patient completed transfusion with PRBC 2 earlier today.  *  IDDM with diabetic retinopathy and diabetic neuropathy.   PLAN:     *  I started once daily oral Protonix, stopped the IV Pepcid.. Will discuss case with Dr. Carlean Purl. Anticipate patient will undergo upper endoscopy, but not today. Therefore I am going to allow him carb modified diet  *  CBC in the morning.   [name]  10/04/2016, 1:11 PM Pager: [phone]   Reason for Consultation:  Anemia.  FOBT +.     HPI: Jeremy Yu is a 50 y.o. male.  PMH CKD 4.  Type 1 DM dx in 1980s.  Diabetic nephropathy with  microalbuminuria.. Diabetes is very poorly controlled. Diabetic retinopathy, legally blind.  Obesity.  GERD.  DVT 2017 Seizures.  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 06/2016 echocardiogram showed LVEF 60% to 66%, grade 2 diastolic dysfunction.  Cardiac myoview 06/2016 measured LVEF at 47%, no ischemia, study labeled as low risk  About a month ago patient admitted to hospital in [facility] evaluation of chest pain. Admitted overnight and patient tells me he was ruled out for MI. He did not have a cardiac cath.  Long-standing history of anemia dating back to childhood. He surmises that at least on 4 occasions from child through adulthood he has required transfusions. He recalls upper endoscopy performed in [place], and [facility] about 9 years ago. He may have had colonoscopy, however with his memory problems, he can't specifically recall having had colonoscopy. On this as well as past occasions he has had, by self-report, bleeding ulcers.  Previously he took prescription iron but he hasn't used this for a few years. He does not use NSAIDs. He doesn't drink alcohol.  Seen at IM resident clinic 10/12 to establish care with a new resident.  At least 3 months ago the patient ran out of some but not all of his  medications. So he hasn't been taking fluid pills before Protonix for about 3 months or more. He didn't get the prescriptions refilled because the resident covering his care had left the clinic and she was waiting to see the new resident whom he saw yesterday..  2 months fatigue.  DOE.  Dizziness. Frequent falls. This past Saturday and Sunday, the patient had loose, black stool which smelled bloody to him. He did not have subsequent stools on Monday through today. He has frequent constipation and it had been more than a week's interval before he had these stools on Saturday and Sunday. Patient has intermittent nausea nausea, this is not severe and is not currently an an issue.  Since  running out of PPI therapy, he has had more heartburn as this was well controlled with PPI therapy. Denies dysphagia. Denies abdominal pain. Noted to have anemia.  Terms of recent hemoglobins is inserted below.  MCV is well within normal limits. Platelets are normal.     This is CBC trend  Ref. Range 10/19/2015 15:59 03/14/2016 13:35 10/03/2016 15:54 10/04/2016 09:40  Hemoglobin Latest Ref Range: 13.0 - 17.0 g/dL 11.2 (L) 8.4 (L) 6.4 (LL) 7.5 (L)  HCT Latest Ref Range: 39.0 - 52.0 % 35.1 (L) 25.1 (L) 19.9 (L) 23.2 (L)  MCV Latest Ref Range: 78.0 - 100.0 fL 92 91.9 92.6 90.3    Iron, iron sat, Ferritin normal.  TIBC reduced.   Past Medical History:  Diagnosis Date  . Anxiety   . Asthma   . Blind in both eyes   . Chronic bronchitis ([place])   . Chronic renal failure    Sees [facility]/notes 05/14/2016  . Daily headache   . Depression   . Diabetic retinopathy ([place])    Archie Endo 11/02/2015  . DVT (deep venous thrombosis) ([place])    "? side" (10/03/2016)  . Falls frequently    "last fall was yesterday" (10/03/2016)  . Fibromyalgia   . GERD (gastroesophageal reflux disease)   . History of blood transfusion   . History of stomach ulcers   . Hyperlipidemia   . Hypertension   . Migraine    "weekly" (10/03/2016)  . Nephropathy    Archie Endo 11/02/2015  . Peripheral neuropathy ([place])    Archie Endo 11/02/2015  . Seizures ([place])    "light; none in the last 2 months; probably have 3/year" (10/03/2016)  . Symptomatic anemia 10/03/2016  . Type I diabetes mellitus ([place]) dx'd ~ 1987   Archie Endo 11/02/2015    Past Surgical History:  Procedure Laterality Date  . NO PAST SURGERIES      Prior to Admission medications   Medication Sig Start Date End Date Taking? Authorizing Provider  albuterol (PROVENTIL HFA;VENTOLIN HFA) 108 (90 Base) MCG/ACT inhaler Inhale 1-2 puffs into the lungs every 6 (six) hours as needed for wheezing or shortness of breath. 03/14/16  Yes Norval Gable, MD  amLODipine  (NORVASC) 10 MG tablet Take 1 tablet (10 mg total) by mouth daily. 04/12/16 04/12/17 Yes Norval Gable, MD  aspirin EC 81 MG tablet Take 1 tablet (81 mg total) by mouth daily. 03/14/16  Yes Norval Gable, MD  atorvastatin (LIPITOR) 40 MG tablet TAKE ONE TABLET BY MOUTH ONCE DAILY 08/28/16  Yes Collier Salina, MD  beclomethasone (QVAR) 80 MCG/ACT inhaler Inhale 2 puffs into the lungs 2 (two) times daily. 10/19/15  Yes Juliet Rude, MD  furosemide (LASIX) 20 MG tablet TAKE ONE TABLET BY MOUTH ONCE DAILY 08/28/16  Yes Harrell Gave  Cassie Freer, MD  gabapentin (NEURONTIN) 400 MG capsule TAKE ONE CAPSULE BY MOUTH THREE TIMES DAILY 08/28/16  Yes Collier Salina, MD  hydrALAZINE (APRESOLINE) 50 MG tablet Take 1 tablet (50 mg total) by mouth 3 (three) times daily. 05/15/16  Yes Josue Hector, MD  insulin aspart (NOVOLOG FLEXPEN) 100 UNIT/ML FlexPen Inject 10 Units into the skin 3 (three) times daily with meals. 06/21/16  Yes Tasrif Ahmed, MD  Insulin Glargine (LANTUS SOLOSTAR) 100 UNIT/ML Solostar Pen Inject 22 Units into the skin 2 (two) times daily at 8 am and 10 pm. 05/22/16  Yes Norval Gable, MD  lisinopril (PRINIVIL,ZESTRIL) 40 MG tablet Take 1 tablet (40 mg total) by mouth daily. 03/14/16  Yes Norval Gable, MD  nitroGLYCERIN (NITROSTAT) 0.4 MG SL tablet Place 1 tablet (0.4 mg total) under the tongue every 5 (five) minutes as needed for chest pain. 03/14/16  Yes Norval Gable, MD  ondansetron (ZOFRAN) 4 MG tablet TAKE 1 TABLET (4 MG TOTAL) BY MOUTH ONCE AS NEEDED FOR NAUSEA OR VOMITING 07/03/16  Yes Collier Salina, MD  pantoprazole (PROTONIX) 40 MG tablet Take 1 tablet (40 mg total) by mouth daily. 03/14/16  Yes Norval Gable, MD    Scheduled Meds: . amLODipine  10 mg Oral Daily  . atorvastatin  40 mg Oral Daily  . budesonide (PULMICORT) nebulizer solution  0.25 mg Nebulization BID  . carvedilol  25 mg Oral BID WC  . famotidine (PEPCID) IV  20 mg Intravenous Q12H  . furosemide  20  mg Oral Daily  . gabapentin  400 mg Oral TID  . insulin aspart  0-15 Units Subcutaneous TID WC  . insulin aspart  0-5 Units Subcutaneous QHS  . insulin aspart  4 Units Subcutaneous TID WC  . insulin glargine  15 Units Subcutaneous BID   Infusions:   PRN Meds: acetaminophen, albuterol, polyethylene glycol, traMADol   Allergies as of 10/03/2016 - Review Complete 10/03/2016  Allergen Reaction Noted  . Penicillins Shortness Of Breath and Itching 03/01/2015    Family History  Problem Relation Age of Onset  . Diabetes Mother   . Hypertension Mother   . Heart failure Mother   . Blindness Mother   . GER disease Mother   . Hypertension Father   . Cancer Father   . Arthritis Father     Social History   Social History  . Marital status: Single    Spouse name: N/A  . Number of children: N/A  . Years of education: N/A   Occupational History  . Not on file.   Social History Main Topics  . Smoking status: Former Smoker    Types: Cigarettes  . Smokeless tobacco: Never Used     Comment: 10/03/2016 "smoked maybe 3 cigarettes/week; stopped when I was 18"  . Alcohol use No     Comment: 10/03/2016 "drank some in my younger days"  . Drug use:     Types: Marijuana     Comment: 10/03/2016 "back in my younger days; nothing in years"  . Sexual activity: No   Other Topics Concern  . Not on file   Social History Narrative  . No narrative on file    REVIEW OF SYSTEMS: Constitutional:  Weakness, fatigue, falls ENT:  No nose bleeds Pulm:   Dyspnea on exertion, cough which is mostly nonproductive. CV:  No palpitations.  Positive lower extremity edema   overnight admission for chest pain as per HPI GU:  No hematuria,  no frequency GI:  Per HPI Heme:  No unusual bleeding from the skin, nose or mouth.   Transfusions:   On at least 4 occasions throughout his lifetime, the first occurred when he was a youngster, well before he was diagnosed with diabetes.   Neuro:  No headaches, no  peripheral tingling or numbness Derm:  No itching, no rash or sores.  Endocrine:   stays cold No polyuria or dysuria.  Sugars often into the 200s and recently up into the 500s. In past years he has often had sugars into the 700s. Hemoglobin A1c's in 05/2016 and now are in the 7.5 Immunization:   I did not inquire Travel:  None beyond local counties in last few months.    PHYSICAL EXAM: Vital signs in last 24 hours: Vitals:   10/04/16 0853 10/04/16 1001  BP: (!) 154/87 (!) 158/85  Pulse: 94   Resp: 16   Temp: 98.4 F (36.9 C)    Wt Readings from Last 3 Encounters:  10/03/16 112.6 kg (248 lb 3.8 oz)  10/03/16 113.4 kg (249 lb 14.4 oz)  06/21/16 114.7 kg (252 lb 12.8 oz)    General: Obese, comfortable AAM. Looks somewhat chronically ill. No acute distress. Head:   No facial asymmetry. Cushingoid.  Eyes:   Cloudy pupils. EOMI. No scleral icterus Ears:   No obvious hearing deficit.  Nose:   No congestion or discharge. Mouth:   Poor to fair dentition. Caries present. Teeth missing. Mucous membranes moist, clear. Tongue midline. Neck:   No TMG, no masses. Lungs:   Clear bilaterally. Cough with production of some frothy white sputum. No dyspnea. Heart:  RRR. No MRG. S1, S2 present. Abdomen:   Obese. NT. ND. No HSM, masses or hernias..   Rectal:  scant amount of hard stool in the vault. No masses. Stool is dark brown and tests heme positive.   Musc/Skeltl:  no joint erythema or gross deformities. Extremities:   2+ pitting edema on the feet.  Neurologic:   Patient is alert. Oriented 3. Mentation is a bit slow, speech appeared slurred. He moves all 4 limbs, strength was not tested. No tremor Skin:   No telangiectasia, rashes or sores.   Psych:   Calm, pleasant, cooperative.  LAB RESULTS:  Recent Labs  10/03/16 1554 10/04/16 0940  WBC 7.6 7.6  HGB 6.4* 7.5*  HCT 19.9* 23.2*  PLT 448* 404*   BMET Lab Results  Component Value Date   NA 142 10/04/2016   NA 141 10/03/2016   NA  137 04/11/2016   K 3.6 10/04/2016   K 3.9 10/03/2016   K 3.9 04/11/2016   CL 112 (H) 10/04/2016   CL 110 10/03/2016   CL 104 04/11/2016   CO2 24 10/04/2016   CO2 25 10/03/2016   CO2 23 04/11/2016   GLUCOSE 163 (H) 10/04/2016   GLUCOSE 296 (H) 10/03/2016   GLUCOSE 216 (H) 04/11/2016   BUN 16 10/04/2016   BUN 19 10/03/2016   BUN 40 (H) 04/11/2016   CREATININE 3.13 (H) 10/04/2016   CREATININE 3.48 (H) 10/03/2016   CREATININE 3.13 (H) 04/11/2016   CALCIUM 8.4 (L) 10/04/2016   CALCIUM 8.3 (L) 10/03/2016   CALCIUM 8.8 (L) 04/11/2016   LFT  Recent Labs  10/04/16 0940  PROT 5.9*  ALBUMIN 2.5*  AST 23  ALT 12*  ALKPHOS 93  BILITOT 0.4  BILIDIR <0.1*  IBILI NOT CALCULATED    RADIOLOGY STUDIES: Dg Chest 2 View  Result Date: 10/03/2016  CLINICAL DATA:  Cough and anemia.  Chronic cough. EXAM: CHEST  2 VIEW COMPARISON:  Chest radiograph 03/01/2015 FINDINGS: AP and lateral views of the chest obtained. There is enlargement of the cardiac silhouette, though degree of cardiac enlargement may be accentuated by AP technique. Mediastinal contours are unchanged. No pulmonary edema or focal airspace disease. No pleural fluid or pneumothorax. No acute osseous abnormality. IMPRESSION: Enlargement of the cardiac silhouette, may be accentuated by AP technique. No localizing process or congestive failure. Electronically Signed   By: Jeb Levering M.D.   On: 10/03/2016 23:17

## 2016-10-05 ENCOUNTER — Encounter (HOSPITAL_COMMUNITY)
Admission: AD | Disposition: A | Discharge status: Home / Self Care | Payer: Self-pay | Source: Ambulatory Visit | Attending: Internal Medicine

## 2016-10-05 ENCOUNTER — Encounter (HOSPITAL_COMMUNITY): Payer: Self-pay | Admitting: *Deleted

## 2016-10-05 DIAGNOSIS — R51 Headache: Secondary | ICD-10-CM

## 2016-10-05 HISTORY — PX: ESOPHAGOGASTRODUODENOSCOPY: SHX5428

## 2016-10-05 LAB — GLUCOSE, CAPILLARY
GLUCOSE-CAPILLARY: 141 mg/dL — AB (ref 65–99)
GLUCOSE-CAPILLARY: 219 mg/dL — AB (ref 65–99)
Glucose-Capillary: 153 mg/dL — ABNORMAL HIGH (ref 65–99)
Glucose-Capillary: 190 mg/dL — ABNORMAL HIGH (ref 65–99)

## 2016-10-05 LAB — CBC
HEMATOCRIT: 22.3 % — AB (ref 39.0–52.0)
HEMATOCRIT: 26.5 % — AB (ref 39.0–52.0)
HEMOGLOBIN: 7.1 g/dL — AB (ref 13.0–17.0)
HEMOGLOBIN: 8.5 g/dL — AB (ref 13.0–17.0)
MCH: 29.2 pg (ref 26.0–34.0)
MCH: 29.2 pg (ref 26.0–34.0)
MCHC: 31.8 g/dL (ref 30.0–36.0)
MCHC: 32.1 g/dL (ref 30.0–36.0)
MCV: 91.8 fL (ref 78.0–100.0)
MCV: 91.1 fL (ref 78.0–100.0)
Platelets: 391 10*3/uL (ref 150–400)
Platelets: 466 10*3/uL — ABNORMAL HIGH (ref 150–400)
RBC: 2.43 MIL/uL — ABNORMAL LOW (ref 4.22–5.81)
RBC: 2.91 MIL/uL — ABNORMAL LOW (ref 4.22–5.81)
RDW: 14.9 % (ref 11.5–15.5)
RDW: 14.4 % (ref 11.5–15.5)
WBC: 7.8 10*3/uL (ref 4.0–10.5)
WBC: 7.9 10*3/uL (ref 4.0–10.5)

## 2016-10-05 LAB — RENAL FUNCTION PANEL
ALBUMIN: 2.2 g/dL — AB (ref 3.5–5.0)
ANION GAP: 6 (ref 5–15)
BUN: 18 mg/dL (ref 6–20)
CALCIUM: 8.1 mg/dL — AB (ref 8.9–10.3)
CO2: 25 mmol/L (ref 22–32)
Chloride: 111 mmol/L (ref 101–111)
Creatinine, Ser: 3.02 mg/dL — ABNORMAL HIGH (ref 0.61–1.24)
GFR calc Af Amer: 26 mL/min — ABNORMAL LOW (ref >60)
GFR, EST NON AFRICAN AMERICAN: 23 mL/min — AB (ref >60)
Glucose, Bld: 155 mg/dL — ABNORMAL HIGH (ref 65–99)
PHOSPHORUS: 4.2 mg/dL (ref 2.5–4.6)
POTASSIUM: 3.8 mmol/L (ref 3.5–5.1)
Sodium: 142 mmol/L (ref 135–145)

## 2016-10-05 LAB — TYPE AND SCREEN
ABO/RH(D): O POS
ANTIBODY SCREEN: NEGATIVE
UNIT DIVISION: 0
UNIT DIVISION: 0

## 2016-10-05 SURGERY — EGD (ESOPHAGOGASTRODUODENOSCOPY)
Anesthesia: Moderate Sedation

## 2016-10-05 MED ORDER — POLYETHYLENE GLYCOL 3350 17 GM/SCOOP PO POWD
0.5000 | Freq: Once | ORAL | Status: DC
  Filled 2016-10-05: qty 255

## 2016-10-05 MED ORDER — METOCLOPRAMIDE HCL 5 MG/ML IJ SOLN
10.0000 mg | Freq: Once | INTRAMUSCULAR | Status: AC
  Administered 2016-10-05: 10 mg via INTRAVENOUS
  Filled 2016-10-05: qty 2

## 2016-10-05 MED ORDER — MIDAZOLAM HCL 10 MG/2ML IJ SOLN
INTRAMUSCULAR | Status: DC | PRN
  Administered 2016-10-05: 2 mg via INTRAVENOUS

## 2016-10-05 MED ORDER — BUTAMBEN-TETRACAINE-BENZOCAINE 2-2-14 % EX AERO
INHALATION_SPRAY | CUTANEOUS | Status: DC | PRN
  Administered 2016-10-05: 2 via TOPICAL

## 2016-10-05 MED ORDER — SODIUM CHLORIDE 0.9 % IV SOLN
INTRAVENOUS | Status: DC
  Administered 2016-10-05: 500 mL via INTRAVENOUS

## 2016-10-05 MED ORDER — FENTANYL CITRATE (PF) 100 MCG/2ML IJ SOLN
INTRAMUSCULAR | Status: AC
  Filled 2016-10-05: qty 2

## 2016-10-05 MED ORDER — BISACODYL 5 MG PO TBEC
20.0000 mg | DELAYED_RELEASE_TABLET | Freq: Once | ORAL | Status: AC
  Administered 2016-10-05: 20 mg via ORAL
  Filled 2016-10-05: qty 4

## 2016-10-05 MED ORDER — POLYETHYLENE GLYCOL 3350 17 GM/SCOOP PO POWD
0.5000 | Freq: Once | ORAL | Status: AC
  Administered 2016-10-05: 127.5 g via ORAL
  Filled 2016-10-05: qty 255

## 2016-10-05 MED ORDER — SODIUM CHLORIDE 0.9 % IV SOLN
INTRAVENOUS | Status: DC
  Administered 2016-10-05: 20 mL/h via INTRAVENOUS

## 2016-10-05 MED ORDER — FENTANYL CITRATE (PF) 100 MCG/2ML IJ SOLN
INTRAMUSCULAR | Status: DC | PRN
  Administered 2016-10-05: 25 ug via INTRAVENOUS

## 2016-10-05 MED ORDER — MIDAZOLAM HCL 5 MG/ML IJ SOLN
INTRAMUSCULAR | Status: AC
  Filled 2016-10-05: qty 2

## 2016-10-05 NOTE — Progress Notes (Signed)
Subjective: Patient was seen and examined this morning.  Planning for EGD today with GI.  Feels okay but still feeling fatigued.  Reported some lightheadedness when getting up out of bed to go to the bathroom.  Bilateral, tension-like headache relieved from "20 out of 10" to "10 out of 10" with Tylenol.  No complaining of any abdominal pain today.  Objective:  Vital signs in last 24 hours: Vitals:   10/04/16 1808 10/04/16 2042 10/04/16 2140 10/05/16 0535  BP: (!) 154/81  (!) 154/78 (!) 144/75  Pulse: 86 82 87 87  Resp:  $Remo'16 18 19  'lnjRH$ Temp:   98.4 F (36.9 C) 98.5 F (36.9 C)  TempSrc:   Oral Oral  SpO2:   99% 99%  Weight:   248 lb 0.3 oz (112.5 kg)   Height:       General: sleeping in bed, arousable, lying on left side, pleasant, not distressed HEENT: [place]/AT Cardiac: RRR Pulm: clear to auscultation bilaterally, moving normal volumes of air Ext: warm and well perfused, 1-2+ pedal edema bilaterally, SCDs on lower extremities Neuro: alert and oriented X3, cranial nerves II-XII grossly intact   Assessment/Plan:  Principal Problem:   Symptomatic anemia Active Problems:   HTN (hypertension)   Asthma, moderate persistent   Decreased hemoglobin   Melena   Anemia  Multifactorial Symptomatic anemia CBC from this morning is 7.1 hemoglobin.  GI has evaluated the patient and will plan for EGD today.  Multifactorial anemia given his CKD but with some blood loss that is likely chronic vs subacute with no further bleeding identified in the hospital. FOBT was positive.  Still having some lightheadedness walking to the bathroom. - follow up morning CBC was hemoglobin of 7.1 - transfuse less than 7.0 - CBC every 12 hours until stable - appreciate GI recommendations - PPI - may benefit in the future from EPO once he gets established with nephrology  HTN (hypertension) His blood pressures this morning are acceptable at 144/75.  He has been out of his lisinopril and hydralazine for the past 2  months.  In addition to those medications he is taking Coreg $RemoveBefo'25mg'PAzpEgFDRsR$  TID, Amlodipine $RemoveBeforeD'10mg'stDNJWUlaWkQPl$  daily and furosemide $RemoveBefore'20mg'QKLrMFbbtQgyS$  daily.  - Hold Lisinopril given worsening CKD - Continue  Amlodipine, Coreg at BID dosing, and Lasix - Consider adding back Hydralazine as BP tolerates for consistent SBP > 160  Chronic Kidney Disease, Stage IV Patient continues to have worsening renal function in the setting of his uncontrolled DM and HTN.  He has normocytic anemia and is likely worsened by his CKD along with blood loss. He needs to follow up with nephrology as has been planned as an outpatient since will likely benefit from EPO. - Iron studies with low-normal iron and unexpectedly normal ferritin - Renal function panel this morning pending  Hyperlipidemia associated with type 2 diabetes mellitus Lipid panel 10/04/2016 with TC 219, TG 321, HDL 40, LDL 115. He is on atorvastatin but has been out of this for 2 months.  Will need refills upon discharge.   - continue atorvastatin   Type I Diabetes, uncontrolled, with retinopathy  Hgb A1C was 7.4 on 10/03/2016.  He is on Lantus 22units BID and Novolog 10units TID. Patient needs refills on his testing supplies upon discharge.   - CBGs have been acceptable this admission - Novolog 4 units TID with meals  - Lantus 15 units subcutaneous  - SSI  GERD Uncontrolled for the past several months as he has not had assess to his  medications.   - EGD this morning - PPI  Diastolic Congestive Heart Failure Patient has a LV EF of 60-65% with grade II diastolic dysfunction likely due to uncontrolled hypertension.  Patient has 1-2+ pitting edema bilaterally and is not complaining of acute shortness of breath.   - Continue Lasix $RemoveBefore'20mg'limWHsecRyklT$    Asthma, moderate persistent - Continue Pulmicort BID  - Proventil Q6H PRN  DVT PPx: SCD  Diet: NPO for procedure, otherwise Carb mod  Dispo: Anticipated discharge in approximately 1-2 day(s).   Jule Ser, DO 10/05/2016, 7:03  AM Pager: [phone]

## 2016-10-05 NOTE — Op Note (Signed)
[facility] Patient Name: Jeremy Yu Procedure Date : 10/05/2016 MRN: [id number] Attending MD: [name] , MD Date of Birth: 1966-08-10 CSN: [id number] Age: 50 Admit Type: Inpatient Procedure:                Upper GI endoscopy Indications:              Melena Providers:                [name], MD, Cleda Daub, RN, Corliss Parish, Technician Referring MD:              Medicines:                Midazolam 2 mg IV, Fentanyl 25 micrograms IV Complications:            No immediate complications. Estimated Blood Loss:     Estimated blood loss: none. Procedure:                Pre-Anesthesia Assessment:                           - Prior to the procedure, a History and Physical                            was performed, and patient medications and                            allergies were reviewed. The patient's tolerance of                            previous anesthesia was also reviewed. The risks                            and benefits of the procedure and the sedation                            options and risks were discussed with the patient.                            All questions were answered, and informed consent                            was obtained. Prior Anticoagulants: The patient has                            taken no previous anticoagulant or antiplatelet                            agents. ASA Grade Assessment: III - A patient with                            severe systemic disease. After reviewing the risks  and benefits, the patient was deemed in                            satisfactory condition to undergo the procedure.                           After obtaining informed consent, the endoscope was                            passed under direct vision. Throughout the                            procedure, the patient's blood pressure, pulse, and                            oxygen  saturations were monitored continuously. The                            EG-[id number] ([id number]) scope was introduced through the                            mouth, and advanced to the second part of duodenum.                            The upper GI endoscopy was accomplished without                            difficulty. The patient tolerated the procedure                            well. The upper GI endoscopy was accomplished                            without difficulty. The patient tolerated the                            procedure well. Scope In: Scope Out: Findings:      The esophagus was normal.      The stomach was normal. Except for slight food retention that I was able       to flush and move about.      The examined duodenum was normal. Impression:               - Normal esophagus.                           - Normal stomach.                           - Normal examined duodenum.                           - No specimens collected.                           - Normal examination ave for slight food  retention                            and no cause of anemia Moderate Sedation:      Moderate (conscious) sedation was administered by the endoscopy nurse       and supervised by the endoscopist. The following parameters were       monitored: oxygen saturation, heart rate, blood pressure, respiratory       rate, EKG, adequacy of pulmonary ventilation, and response to care.       Total physician intraservice time was 11 minutes. Recommendation:           - Continue present medications.                           - Perform a colonoscopy tomorrow.                           - Clear liquid diet. Procedure Code(s):        --- Professional ---                           [phone], Esophagogastroduodenoscopy, flexible,                            transoral; diagnostic, including collection of                            specimen(s) by brushing or washing, when performed                             (separate procedure)                           99152, Moderate sedation services provided by the                            same physician or other qualified health care                            professional performing the diagnostic or                            therapeutic service that the sedation supports,                            requiring the presence of an independent trained                            observer to assist in the monitoring of the                            patient's level of consciousness and physiological                            status; initial 15 minutes of intraservice time,  patient age 35 years or older Diagnosis Code(s):        --- Professional ---                           K92.1, Melena (includes Hematochezia) CPT copyright 2016 [employer]. All rights reserved. The codes documented in this report are preliminary and upon coder review may  be revised to meet current compliance requirements. [name], MD 10/05/2016 11:01:06 AM This report has been signed electronically. Number of Addenda: 0

## 2016-10-05 NOTE — Progress Notes (Signed)
  Date: 10/05/2016  Patient name: Jeremy Yu  Medical record number: [id number]  Date of birth: July 09, 1966   I have personally seen this patient and the plan of care was discussed with the house staff. Please see Dr. Alcario Drought note for complete details. I concur with his findings with the following additions/corrections:   HA likely related to Hypertension.  Tylenol is helping.  Will continue to monitor closely for changes and attempt better BP control.   Sid Falcon, MD 10/05/2016, 8:25 PM

## 2016-10-06 ENCOUNTER — Encounter (HOSPITAL_COMMUNITY)
Admission: AD | Disposition: A | Discharge status: Home / Self Care | Payer: Self-pay | Source: Ambulatory Visit | Attending: Internal Medicine

## 2016-10-06 ENCOUNTER — Encounter (HOSPITAL_COMMUNITY): Payer: Self-pay | Admitting: *Deleted

## 2016-10-06 HISTORY — PX: COLONOSCOPY: SHX5424

## 2016-10-06 LAB — RENAL FUNCTION PANEL
ALBUMIN: 2.2 g/dL — AB (ref 3.5–5.0)
ANION GAP: 6 (ref 5–15)
BUN: 16 mg/dL (ref 6–20)
CHLORIDE: 110 mmol/L (ref 101–111)
CO2: 25 mmol/L (ref 22–32)
Calcium: 8.4 mg/dL — ABNORMAL LOW (ref 8.9–10.3)
Creatinine, Ser: 2.67 mg/dL — ABNORMAL HIGH (ref 0.61–1.24)
GFR calc Af Amer: 30 mL/min — ABNORMAL LOW (ref >60)
GFR, EST NON AFRICAN AMERICAN: 26 mL/min — AB (ref >60)
Glucose, Bld: 134 mg/dL — ABNORMAL HIGH (ref 65–99)
PHOSPHORUS: 3.9 mg/dL (ref 2.5–4.6)
POTASSIUM: 3.5 mmol/L (ref 3.5–5.1)
Sodium: 141 mmol/L (ref 135–145)

## 2016-10-06 LAB — CBC
HEMATOCRIT: 23.3 % — AB (ref 39.0–52.0)
HEMOGLOBIN: 7.6 g/dL — AB (ref 13.0–17.0)
MCH: 29.7 pg (ref 26.0–34.0)
MCHC: 32.6 g/dL (ref 30.0–36.0)
MCV: 91 fL (ref 78.0–100.0)
Platelets: 390 10*3/uL (ref 150–400)
RBC: 2.56 MIL/uL — AB (ref 4.22–5.81)
RDW: 14.4 % (ref 11.5–15.5)
WBC: 7.2 10*3/uL (ref 4.0–10.5)

## 2016-10-06 LAB — GLUCOSE, CAPILLARY
GLUCOSE-CAPILLARY: 130 mg/dL — AB (ref 65–99)
GLUCOSE-CAPILLARY: 109 mg/dL — AB (ref 65–99)
Glucose-Capillary: 149 mg/dL — ABNORMAL HIGH (ref 65–99)

## 2016-10-06 SURGERY — COLONOSCOPY
Anesthesia: Moderate Sedation

## 2016-10-06 MED ORDER — PEG-KCL-NACL-NASULF-NA ASC-C 100 G PO SOLR
0.5000 | Freq: Once | ORAL | Status: DC
Start: 2016-10-06 — End: 2016-10-06

## 2016-10-06 MED ORDER — HYDRALAZINE HCL 25 MG PO TABS
25.0000 mg | ORAL_TABLET | Freq: Three times a day (TID) | ORAL | Status: DC
  Administered 2016-10-06 – 2016-10-07 (×4): 25 mg via ORAL
  Filled 2016-10-06 (×4): qty 1

## 2016-10-06 MED ORDER — FENTANYL CITRATE (PF) 100 MCG/2ML IJ SOLN
INTRAMUSCULAR | Status: DC | PRN
  Administered 2016-10-06: 25 ug via INTRAVENOUS

## 2016-10-06 MED ORDER — FENTANYL CITRATE (PF) 100 MCG/2ML IJ SOLN
INTRAMUSCULAR | Status: AC
  Filled 2016-10-06: qty 2

## 2016-10-06 MED ORDER — SODIUM CHLORIDE 0.9 % IV SOLN
INTRAVENOUS | Status: DC
  Administered 2016-10-06: 20 mL/h via INTRAVENOUS

## 2016-10-06 MED ORDER — PEG-KCL-NACL-NASULF-NA ASC-C 100 G PO SOLR
0.5000 | Freq: Once | ORAL | Status: AC
  Administered 2016-10-06: 100 g via ORAL
  Filled 2016-10-06: qty 1

## 2016-10-06 MED ORDER — METOCLOPRAMIDE HCL 5 MG/ML IJ SOLN
10.0000 mg | Freq: Once | INTRAMUSCULAR | Status: AC
  Administered 2016-10-06: 10 mg via INTRAVENOUS
  Filled 2016-10-06: qty 2

## 2016-10-06 MED ORDER — MIDAZOLAM HCL 5 MG/ML IJ SOLN
INTRAMUSCULAR | Status: AC
  Filled 2016-10-06: qty 2

## 2016-10-06 MED ORDER — DIPHENHYDRAMINE HCL 50 MG/ML IJ SOLN
INTRAMUSCULAR | Status: AC
  Filled 2016-10-06: qty 1

## 2016-10-06 MED ORDER — PEG-KCL-NACL-NASULF-NA ASC-C 100 G PO SOLR: 0.5000 | Freq: Once | ORAL | Status: DC

## 2016-10-06 MED ORDER — MIDAZOLAM HCL 5 MG/5ML IJ SOLN
INTRAMUSCULAR | Status: DC | PRN
  Administered 2016-10-06: 2 mg via INTRAVENOUS

## 2016-10-06 NOTE — Op Note (Signed)
[facility] Patient Name: Jeremy Yu Procedure Date : 10/06/2016 MRN: [id number] Attending MD: [name] , MD Date of Birth: March 14, 1966 CSN: [id number] Age: 50 Admit Type: Inpatient Procedure:                Colonoscopy Indications:              Evaluation of unexplained GI bleeding Providers:                [name], MD, Laverta Baltimore RN, RN,                            Corliss Parish, Technician Referring MD:              Medicines:                Fentanyl 25 micrograms IV, Midazolam 2 mg IV Complications:            No immediate complications. Estimated Blood Loss:     Estimated blood loss: none. Procedure:                Pre-Anesthesia Assessment:                           - Prior to the procedure, a History and Physical                            was performed, and patient medications and                            allergies were reviewed. The patient's tolerance of                            previous anesthesia was also reviewed. The risks                            and benefits of the procedure and the sedation                            options and risks were discussed with the patient.                            All questions were answered, and informed consent                            was obtained. Prior Anticoagulants: The patient has                            taken no previous anticoagulant or antiplatelet                            agents. ASA Grade Assessment: III - A patient with                            severe systemic disease. After reviewing the risks  and benefits, the patient was deemed in                            satisfactory condition to undergo the procedure.                           After obtaining informed consent, the colonoscope                            was passed under direct vision. Throughout the                            procedure, the patient's blood pressure, pulse, and                    oxygen saturations were monitored continuously. The                            EC-[id number] ([id number]) scope was introduced through                            the anus with the intention of advancing to the                            cecum. The scope was advanced to the sigmoid colon                            before the procedure was aborted. Medications were                            given. The colonoscopy was aborted due to poor                            bowel prep. No anatomical landmarks were                            photographed. The bowel preparation used was                            NuLytely. The quality of the bowel preparation was                            poor. Findings:      The perianal and digital rectal examinations were normal. Pertinent       negatives include normal prostate (size, shape, and consistency).      A moderate amount of stool was found in the rectum and in the sigmoid       colon, precluding visualization. Impression:               - The procedure was aborted due to poor bowel prep.                           - Stool in the rectum and in the sigmoid colon.                           -  No specimens collected. Moderate Sedation:      Moderate (conscious) sedation was administered by the endoscopy nurse       and supervised by the endoscopist. The following parameters were       monitored: oxygen saturation, heart rate, blood pressure, and response       to care. Total physician intraservice time was 5 minutes. Recommendation:           - Return patient to hospital ward for ongoing care.                           - Repeat colonoscopy tomorrow because the bowel                            preparation was poor.                           -                           WILL GIVE ADDITIONAL DAY OF PREP AND HAVE DR.                            ARMBRUSTER TRY COLONOSCOPY TOMORROW                           - Clear liquid diet today.                            - Continue present medications. Procedure Code(s):        --- Professional ---                           [phone], 16, Colonoscopy, flexible; diagnostic,                            including collection of specimen(s) by brushing or                            washing, when performed (separate procedure) Diagnosis Code(s):        --- Professional ---                           Z53.8, Procedure and treatment not carried out for                            other reasons                           K92.2, Gastrointestinal hemorrhage, unspecified CPT copyright 2016 American [employer] Association. All rights reserved. The codes documented in this report are preliminary and upon coder review may  be revised to meet current compliance requirements. [name], MD 10/06/2016 8:16:17 AM This report has been signed electronically. Number of Addenda: 0

## 2016-10-06 NOTE — Progress Notes (Signed)
   Subjective: Patient was seen and examined this morning.  Colonoscopy was aborted yesterday due to poor prep.  Clear liquid diet today and attempt better prep for colonoscopy tomorrow.  Otherwise, patient feels well and is without complaints.  He tripped yesterday in the bathroom but did not hurt himself and did not hit his head.    Objective:  Vital signs in last 24 hours: Vitals:   10/06/16 0813 10/06/16 0815 10/06/16 0820 10/06/16 0930  BP: (!) 149/79 (!) 155/84 (!) 147/85 (!) 155/80  Pulse: 84 86 84 87  Resp: $Remo'15 17 17 18  'wIenn$ Temp: 98.1 F (36.7 C)   98.4 F (36.9 C)  TempSrc: Oral   Oral  SpO2: 100% 99% 95% 96%  Weight:      Height:       General: lying in bed, pleasant, not distressed HEENT: [place]/AT Cardiac: RRR Pulm: normal effort Neuro: alert and oriented X3, cranial nerves II-XII grossly intact   Assessment/Plan:  Principal Problem:   Symptomatic anemia Active Problems:   HTN (hypertension)   Asthma, moderate persistent   Decreased hemoglobin   Melena   Anemia  Multifactorial Symptomatic anemia Hemoglobin has remained stable during hospitalization above 7.  This morning it was 7.6.  EGD yesterday was unrevealing.  Colonoscopy this morning was aborted due to poor prep.  Will attempt again tomorrow. - daily CBC - transfuse less than 7.0 - appreciate GI recommendations - PPI - may benefit in the future from EPO once he gets established with nephrology  HTN (hypertension) His BP yesterday became more elevated throughout the day.  We have continued his amlodipine, Coreg, and Lasix thus far - Hold Lisinopril given worsening CKD - Continue  Amlodipine, Coreg at BID dosing, and Lasix - Add back Hydralazine at lower than home dose of $Remov'25mg'QcilZZ$  TID.  Can increase back to $Remov'50mg'fzdGSy$  TID as BP tolerates  Chronic Kidney Disease, Stage IV Patient continues to have worsening renal function in the setting of his uncontrolled DM and HTN.  He has normocytic anemia and is likely worsened  by his CKD along with blood loss. He needs to follow up with nephrology as has been planned as an outpatient since will likely benefit from EPO. - Iron studies with low-normal iron and unexpectedly normal ferritin - Renal function panel daily  Hyperlipidemia associated with type 2 diabetes mellitus Lipid panel 10/04/2016 with TC 219, TG 321, HDL 40, LDL 115. He is on atorvastatin but has been out of this for 2 months.  Will need refills upon discharge.   - continue atorvastatin   Type I Diabetes, uncontrolled, with retinopathy  Hgb A1C was 7.4 on 10/03/2016.  He is on Lantus 22units BID and Novolog 10units TID. Patient needs refills on his testing supplies upon discharge.   - CBGs have been acceptable this admission - Novolog 4 units TID with meals  - Lantus 15 units subcutaneous  - SSI  GERD Uncontrolled for the past several months as he has not had assess to his medications.   - EGD unremarkable  - continue PPI  Diastolic Congestive Heart Failure Patient has a LV EF of 60-65% with grade II diastolic dysfunction likely due to uncontrolled hypertension.  - Continue Lasix $RemoveBefore'20mg'ORKjbfBOirIJf$    Asthma, moderate persistent - Continue Pulmicort BID  - Proventil Q6H PRN  DVT PPx: SCD  Diet: clears, NPO at midnight  Dispo: Anticipated discharge in approximately 1 day(s).   Jule Ser, DO 10/06/2016, 12:10 PM Pager: [phone]

## 2016-10-06 NOTE — Progress Notes (Signed)
CNA said  she was took patient to the bathroom and while in there he asked her for some privacy. She closed the door slightly. Patient states he got up in the bathroom and started to " dance around a little and tripped up with his legs and sit to the floor"  Immediate vitals was taken on/ call teaching service for internal medicine called and informed of fall( Dr. Marjory Sneddon)  Vitals reported 128/88 hr 77 sat 99%. patient denies any pain or discomfort at this time. states he did not hit his head. No new orders given at this time. Fall huddle called safety and support provide to patient. fall protocol initiated. Rn will will continue to monitor patent closely during shift

## 2016-10-07 ENCOUNTER — Encounter (HOSPITAL_COMMUNITY)
Admission: AD | Disposition: A | Discharge status: Home / Self Care | Payer: Self-pay | Source: Ambulatory Visit | Attending: Internal Medicine

## 2016-10-07 ENCOUNTER — Inpatient Hospital Stay (HOSPITAL_COMMUNITY): Payer: Medicaid Other | Admitting: Anesthesiology

## 2016-10-07 ENCOUNTER — Encounter (HOSPITAL_COMMUNITY): Payer: Self-pay | Admitting: Internal Medicine

## 2016-10-07 DIAGNOSIS — K5730 Diverticulosis of large intestine without perforation or abscess without bleeding: Secondary | ICD-10-CM

## 2016-10-07 DIAGNOSIS — K648 Other hemorrhoids: Secondary | ICD-10-CM

## 2016-10-07 DIAGNOSIS — K649 Unspecified hemorrhoids: Secondary | ICD-10-CM

## 2016-10-07 DIAGNOSIS — D649 Anemia, unspecified: Secondary | ICD-10-CM

## 2016-10-07 HISTORY — PX: COLONOSCOPY: SHX5424

## 2016-10-07 LAB — RENAL FUNCTION PANEL
ALBUMIN: 2.4 g/dL — AB (ref 3.5–5.0)
ANION GAP: 7 (ref 5–15)
BUN: 13 mg/dL (ref 6–20)
CALCIUM: 8.5 mg/dL — AB (ref 8.9–10.3)
CO2: 25 mmol/L (ref 22–32)
Chloride: 110 mmol/L (ref 101–111)
Creatinine, Ser: 2.45 mg/dL — ABNORMAL HIGH (ref 0.61–1.24)
GFR calc Af Amer: 34 mL/min — ABNORMAL LOW (ref >60)
GFR, EST NON AFRICAN AMERICAN: 29 mL/min — AB (ref >60)
GLUCOSE: 83 mg/dL (ref 65–99)
PHOSPHORUS: 3.5 mg/dL (ref 2.5–4.6)
POTASSIUM: 3.5 mmol/L (ref 3.5–5.1)
SODIUM: 142 mmol/L (ref 135–145)

## 2016-10-07 LAB — CBC
HEMATOCRIT: 25.2 % — AB (ref 39.0–52.0)
Hemoglobin: 8.1 g/dL — ABNORMAL LOW (ref 13.0–17.0)
MCH: 29.2 pg (ref 26.0–34.0)
MCHC: 32.1 g/dL (ref 30.0–36.0)
MCV: 91 fL (ref 78.0–100.0)
PLATELETS: 480 10*3/uL — AB (ref 150–400)
RBC: 2.77 MIL/uL — ABNORMAL LOW (ref 4.22–5.81)
RDW: 14 % (ref 11.5–15.5)
WBC: 7.7 10*3/uL (ref 4.0–10.5)

## 2016-10-07 LAB — GLUCOSE, CAPILLARY
GLUCOSE-CAPILLARY: 85 mg/dL (ref 65–99)
Glucose-Capillary: 168 mg/dL — ABNORMAL HIGH (ref 65–99)
Glucose-Capillary: 80 mg/dL (ref 65–99)
Glucose-Capillary: 107 mg/dL — ABNORMAL HIGH (ref 65–99)

## 2016-10-07 SURGERY — COLONOSCOPY
Anesthesia: Monitor Anesthesia Care

## 2016-10-07 MED ORDER — LIDOCAINE HCL (CARDIAC) 20 MG/ML IV SOLN
INTRAVENOUS | Status: DC | PRN
  Administered 2016-10-07: 100 mg via INTRATRACHEAL

## 2016-10-07 MED ORDER — LACTATED RINGERS IV SOLN
INTRAVENOUS | Status: DC | PRN
  Administered 2016-10-07: 11:00:00 via INTRAVENOUS

## 2016-10-07 MED ORDER — PROPOFOL 10 MG/ML IV BOLUS
INTRAVENOUS | Status: DC | PRN
  Administered 2016-10-07: 70 mg via INTRAVENOUS

## 2016-10-07 MED ORDER — PROPOFOL 500 MG/50ML IV EMUL
INTRAVENOUS | Status: DC | PRN
  Administered 2016-10-07: 125 ug/kg/min via INTRAVENOUS

## 2016-10-07 NOTE — Transfer of Care (Signed)
Immediate Anesthesia Transfer of Care Note  Patient: Jeremy Yu  Procedure(s) Performed: Procedure(s) with comments: COLONOSCOPY (N/A) - would be ok with moderate I think  Patient Location: Endoscopy Unit  Anesthesia Type:MAC  Level of Consciousness: awake, alert  and patient cooperative  Airway & Oxygen Therapy: Patient Spontanous Breathing  Post-op Assessment: Report given to RN and Post -op Vital signs reviewed and stable  Post vital signs: Reviewed and stable  Last Vitals:  Vitals:   10/07/16 0930 10/07/16 1026  BP: (!) 179/93 (!) 167/85  Pulse: 80 80  Resp: 18 20  Temp: 37.2 C 36.4 C    Last Pain:  Vitals:   10/07/16 1026  TempSrc: Oral  PainSc:       Patients Stated Pain Goal: 0 (94/58/59 2924)  Complications: No apparent anesthesia complications

## 2016-10-07 NOTE — Clinical Social Work Note (Signed)
Patient medically stable for discharge and is requesting assistance with transport home. Taxi voucher provided to Mr. Akens's nurse to transport patient home.  Rafaelita Foister Givens, MSW, LCSW Licensed Clinical Social Worker [place] [phone]

## 2016-10-07 NOTE — H&P (View-Only) (Signed)
[name]: 1:11 PM 10/04/2016  LOS: 1 day    Referring Provider: Dr Daryll Drown.  Primary Care Physician:  Jule Ser, DO Primary Gastroenterologist:  unassigned     [facility] Attending   I have taken an interval history, reviewed the chart and examined the patient. I agree with the Advanced Practitioner's note, impression and recommendations.   GI bleeding - subacute? Chronic? Acute Multifactorial anemia seems likely but hx of melena and decline in Hgb  Start w/u with EGD tomorrow - if that is unrevealing colonoscopy next The risks and benefits as well as alternatives of endoscopic procedure(s) have been discussed and reviewed. All questions answered. The patient agrees to proceed.  [name], MD, [facility] [phone] ([phone] after 5 PM, weekends and holidays  10/04/2016 7:00 PM    IMPRESSION:   *  FOBT + anemia.  Patient is not iron deficient. I suspect the bulk of his anemia is due to his late stage chronic kidney disease. He may need to initiate Neupogen therapy. Has history of bleeding peptic ulcer disease and hasn't been taking PPI for at least 3 months. Patient completed transfusion with PRBC 2 earlier today.  *  IDDM with diabetic retinopathy and diabetic neuropathy.   PLAN:     *  I started once daily oral Protonix, stopped the IV Pepcid.. Will discuss case with Dr. Carlean Purl. Anticipate patient will undergo upper endoscopy, but not today. Therefore I am going to allow him carb modified diet  *  CBC in the morning.   [name]  10/04/2016, 1:11 PM Pager: [phone]   Reason for Consultation:  Anemia.  FOBT +.     HPI: Jeremy Yu is a 50 y.o. male.  PMH CKD 4.  Type 1 DM dx in 1980s.  Diabetic nephropathy with  microalbuminuria.. Diabetes is very poorly controlled. Diabetic retinopathy, legally blind.  Obesity.  GERD.  DVT 2017 Seizures.  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 06/2016 echocardiogram showed LVEF 60% to 38%, grade 2 diastolic dysfunction.  Cardiac myoview 06/2016 measured LVEF at 47%, no ischemia, study labeled as low risk  About a month ago patient admitted to hospital in [facility] evaluation of chest pain. Admitted overnight and patient tells me he was ruled out for MI. He did not have a cardiac cath.  Long-standing history of anemia dating back to childhood. He surmises that at least on 4 occasions from child through adulthood he has required transfusions. He recalls upper endoscopy performed in [place], and [facility] about 9 years ago. He may have had colonoscopy, however with his memory problems, he can't specifically recall having had colonoscopy. On this as well as past occasions he has had, by self-report, bleeding ulcers.  Previously he took prescription iron but he hasn't used this for a few years. He does not use NSAIDs. He doesn't drink alcohol.  Seen at IM resident clinic 10/12 to establish care with a new resident.  At least 3 months ago the patient ran out of some but not all of his  medications. So he hasn't been taking fluid pills before Protonix for about 3 months or more. He didn't get the prescriptions refilled because the resident covering his care had left the clinic and she was waiting to see the new resident whom he saw yesterday..  2 months fatigue.  DOE.  Dizziness. Frequent falls. This past Saturday and Sunday, the patient had loose, black stool which smelled bloody to him. He did not have subsequent stools on Monday through today. He has frequent constipation and it had been more than a week's interval before he had these stools on Saturday and Sunday. Patient has intermittent nausea nausea, this is not severe and is not currently an an issue.  Since  running out of PPI therapy, he has had more heartburn as this was well controlled with PPI therapy. Denies dysphagia. Denies abdominal pain. Noted to have anemia.  Terms of recent hemoglobins is inserted below.  MCV is well within normal limits. Platelets are normal.     This is CBC trend  Ref. Range 10/19/2015 15:59 03/14/2016 13:35 10/03/2016 15:54 10/04/2016 09:40  Hemoglobin Latest Ref Range: 13.0 - 17.0 g/dL 14.4 (L) 8.4 (L) 6.4 (LL) 7.5 (L)  HCT Latest Ref Range: 39.0 - 52.0 % 35.1 (L) 25.1 (L) 19.9 (L) 23.2 (L)  MCV Latest Ref Range: 78.0 - 100.0 fL 92 91.9 92.6 90.3    Iron, iron sat, Ferritin normal.  TIBC reduced.   Past Medical History:  Diagnosis Date  . Anxiety   . Asthma   . Blind in both eyes   . Chronic bronchitis (HCC)   . Chronic renal failure    Sees [facility]/notes 05/14/2016  . Daily headache   . Depression   . Diabetic retinopathy (HCC)    [name] 11/02/2015  . DVT (deep venous thrombosis) (HCC)    "? side" (10/03/2016)  . Falls frequently    "last fall was yesterday" (10/03/2016)  . Fibromyalgia   . GERD (gastroesophageal reflux disease)   . History of blood transfusion   . History of stomach ulcers   . Hyperlipidemia   . Hypertension   . Migraine    "weekly" (10/03/2016)  . Nephropathy    [name] 11/02/2015  . Peripheral neuropathy (HCC)    [name] 11/02/2015  . Seizures (HCC)    "light; none in the last 2 months; probably have 3/year" (10/03/2016)  . Symptomatic anemia 10/03/2016  . Type I diabetes mellitus (HCC) dx'd ~ 1987   [name] 11/02/2015    Past Surgical History:  Procedure Laterality Date  . NO PAST SURGERIES      Prior to Admission medications   Medication Sig Start Date End Date Taking? Authorizing Provider  albuterol (PROVENTIL HFA;VENTOLIN HFA) 108 (90 Base) MCG/ACT inhaler Inhale 1-2 puffs into the lungs every 6 (six) hours as needed for wheezing or shortness of breath. 03/14/16  Yes Darrick Huntsman, MD  amLODipine  (NORVASC) 10 MG tablet Take 1 tablet (10 mg total) by mouth daily. 04/12/16 04/12/17 Yes Darrick Huntsman, MD  aspirin EC 81 MG tablet Take 1 tablet (81 mg total) by mouth daily. 03/14/16  Yes Darrick Huntsman, MD  atorvastatin (LIPITOR) 40 MG tablet TAKE ONE TABLET BY MOUTH ONCE DAILY 08/28/16  Yes Fuller Plan, MD  beclomethasone (QVAR) 80 MCG/ACT inhaler Inhale 2 puffs into the lungs 2 (two) times daily. 10/19/15  Yes Su Hoff, MD  furosemide (LASIX) 20 MG tablet TAKE ONE TABLET BY MOUTH ONCE DAILY 08/28/16  Yes Cristal Deer  Cassie Freer, MD  gabapentin (NEURONTIN) 400 MG capsule TAKE ONE CAPSULE BY MOUTH THREE TIMES DAILY 08/28/16  Yes Collier Salina, MD  hydrALAZINE (APRESOLINE) 50 MG tablet Take 1 tablet (50 mg total) by mouth 3 (three) times daily. 05/15/16  Yes Josue Hector, MD  insulin aspart (NOVOLOG FLEXPEN) 100 UNIT/ML FlexPen Inject 10 Units into the skin 3 (three) times daily with meals. 06/21/16  Yes Tasrif Ahmed, MD  Insulin Glargine (LANTUS SOLOSTAR) 100 UNIT/ML Solostar Pen Inject 22 Units into the skin 2 (two) times daily at 8 am and 10 pm. 05/22/16  Yes Norval Gable, MD  lisinopril (PRINIVIL,ZESTRIL) 40 MG tablet Take 1 tablet (40 mg total) by mouth daily. 03/14/16  Yes Norval Gable, MD  nitroGLYCERIN (NITROSTAT) 0.4 MG SL tablet Place 1 tablet (0.4 mg total) under the tongue every 5 (five) minutes as needed for chest pain. 03/14/16  Yes Norval Gable, MD  ondansetron (ZOFRAN) 4 MG tablet TAKE 1 TABLET (4 MG TOTAL) BY MOUTH ONCE AS NEEDED FOR NAUSEA OR VOMITING 07/03/16  Yes Collier Salina, MD  pantoprazole (PROTONIX) 40 MG tablet Take 1 tablet (40 mg total) by mouth daily. 03/14/16  Yes Norval Gable, MD    Scheduled Meds: . amLODipine  10 mg Oral Daily  . atorvastatin  40 mg Oral Daily  . budesonide (PULMICORT) nebulizer solution  0.25 mg Nebulization BID  . carvedilol  25 mg Oral BID WC  . famotidine (PEPCID) IV  20 mg Intravenous Q12H  . furosemide  20  mg Oral Daily  . gabapentin  400 mg Oral TID  . insulin aspart  0-15 Units Subcutaneous TID WC  . insulin aspart  0-5 Units Subcutaneous QHS  . insulin aspart  4 Units Subcutaneous TID WC  . insulin glargine  15 Units Subcutaneous BID   Infusions:   PRN Meds: acetaminophen, albuterol, polyethylene glycol, traMADol   Allergies as of 10/03/2016 - Review Complete 10/03/2016  Allergen Reaction Noted  . Penicillins Shortness Of Breath and Itching 03/01/2015    Family History  Problem Relation Age of Onset  . Diabetes Mother   . Hypertension Mother   . Heart failure Mother   . Blindness Mother   . GER disease Mother   . Hypertension Father   . Cancer Father   . Arthritis Father     Social History   Social History  . Marital status: Single    Spouse name: N/A  . Number of children: N/A  . Years of education: N/A   Occupational History  . Not on file.   Social History Main Topics  . Smoking status: Former Smoker    Types: Cigarettes  . Smokeless tobacco: Never Used     Comment: 10/03/2016 "smoked maybe 3 cigarettes/week; stopped when I was 18"  . Alcohol use No     Comment: 10/03/2016 "drank some in my younger days"  . Drug use:     Types: Marijuana     Comment: 10/03/2016 "back in my younger days; nothing in years"  . Sexual activity: No   Other Topics Concern  . Not on file   Social History Narrative  . No narrative on file    REVIEW OF SYSTEMS: Constitutional:  Weakness, fatigue, falls ENT:  No nose bleeds Pulm:   Dyspnea on exertion, cough which is mostly nonproductive. CV:  No palpitations.  Positive lower extremity edema   overnight admission for chest pain as per HPI GU:  No hematuria,  no frequency GI:  Per HPI Heme:  No unusual bleeding from the skin, nose or mouth.   Transfusions:   On at least 4 occasions throughout his lifetime, the first occurred when he was a youngster, well before he was diagnosed with diabetes.   Neuro:  No headaches, no  peripheral tingling or numbness Derm:  No itching, no rash or sores.  Endocrine:   stays cold No polyuria or dysuria.  Sugars often into the 200s and recently up into the 500s. In past years he has often had sugars into the 700s. Hemoglobin A1c's in 05/2016 and now are in the 7.5 Immunization:   I did not inquire Travel:  None beyond local counties in last few months.    PHYSICAL EXAM: Vital signs in last 24 hours: Vitals:   10/04/16 0853 10/04/16 1001  BP: (!) 154/87 (!) 158/85  Pulse: 94   Resp: 16   Temp: 98.4 F (36.9 C)    Wt Readings from Last 3 Encounters:  10/03/16 112.6 kg (248 lb 3.8 oz)  10/03/16 113.4 kg (249 lb 14.4 oz)  06/21/16 114.7 kg (252 lb 12.8 oz)    General: Obese, comfortable AAM. Looks somewhat chronically ill. No acute distress. Head:   No facial asymmetry. Cushingoid.  Eyes:   Cloudy pupils. EOMI. No scleral icterus Ears:   No obvious hearing deficit.  Nose:   No congestion or discharge. Mouth:   Poor to fair dentition. Caries present. Teeth missing. Mucous membranes moist, clear. Tongue midline. Neck:   No TMG, no masses. Lungs:   Clear bilaterally. Cough with production of some frothy white sputum. No dyspnea. Heart:  RRR. No MRG. S1, S2 present. Abdomen:   Obese. NT. ND. No HSM, masses or hernias..   Rectal:  scant amount of hard stool in the vault. No masses. Stool is dark brown and tests heme positive.   Musc/Skeltl:  no joint erythema or gross deformities. Extremities:   2+ pitting edema on the feet.  Neurologic:   Patient is alert. Oriented 3. Mentation is a bit slow, speech appeared slurred. He moves all 4 limbs, strength was not tested. No tremor Skin:   No telangiectasia, rashes or sores.   Psych:   Calm, pleasant, cooperative.  LAB RESULTS:  Recent Labs  10/03/16 1554 10/04/16 0940  WBC 7.6 7.6  HGB 6.4* 7.5*  HCT 19.9* 23.2*  PLT 448* 404*   BMET Lab Results  Component Value Date   NA 142 10/04/2016   NA 141 10/03/2016   NA  137 04/11/2016   K 3.6 10/04/2016   K 3.9 10/03/2016   K 3.9 04/11/2016   CL 112 (H) 10/04/2016   CL 110 10/03/2016   CL 104 04/11/2016   CO2 24 10/04/2016   CO2 25 10/03/2016   CO2 23 04/11/2016   GLUCOSE 163 (H) 10/04/2016   GLUCOSE 296 (H) 10/03/2016   GLUCOSE 216 (H) 04/11/2016   BUN 16 10/04/2016   BUN 19 10/03/2016   BUN 40 (H) 04/11/2016   CREATININE 3.13 (H) 10/04/2016   CREATININE 3.48 (H) 10/03/2016   CREATININE 3.13 (H) 04/11/2016   CALCIUM 8.4 (L) 10/04/2016   CALCIUM 8.3 (L) 10/03/2016   CALCIUM 8.8 (L) 04/11/2016   LFT  Recent Labs  10/04/16 0940  PROT 5.9*  ALBUMIN 2.5*  AST 23  ALT 12*  ALKPHOS 93  BILITOT 0.4  BILIDIR <0.1*  IBILI NOT CALCULATED    RADIOLOGY STUDIES: Dg Chest 2 View  Result Date: 10/03/2016  CLINICAL DATA:  Cough and anemia.  Chronic cough. EXAM: CHEST  2 VIEW COMPARISON:  Chest radiograph 03/01/2015 FINDINGS: AP and lateral views of the chest obtained. There is enlargement of the cardiac silhouette, though degree of cardiac enlargement may be accentuated by AP technique. Mediastinal contours are unchanged. No pulmonary edema or focal airspace disease. No pleural fluid or pneumothorax. No acute osseous abnormality. IMPRESSION: Enlargement of the cardiac silhouette, may be accentuated by AP technique. No localizing process or congestive failure. Electronically Signed   By: Jeb Levering M.D.   On: 10/03/2016 23:17

## 2016-10-07 NOTE — Discharge Summary (Signed)
Name: Jeremy Yu MRN: [id number] DOB: Nov 10, 1966 50 y.o. PCP: Jule Ser, DO  Date of Admission: 10/03/2016  6:03 PM Date of Discharge: 10/07/2016 Attending Physician: Sid Falcon, MD  Discharge Diagnosis: 1. Symptomatic anemia  2. Hemorrhoids  3. CKD   Discharge Medications:   Medication List    TAKE these medications   albuterol 108 (90 Base) MCG/ACT inhaler Commonly known as:  PROVENTIL HFA;VENTOLIN HFA Inhale 1-2 puffs into the lungs every 6 (six) hours as needed for wheezing or shortness of breath.   amLODipine 10 MG tablet Commonly known as:  NORVASC Take 1 tablet (10 mg total) by mouth daily.   aspirin EC 81 MG tablet Take 1 tablet (81 mg total) by mouth daily.   atorvastatin 40 MG tablet Commonly known as:  LIPITOR TAKE ONE TABLET BY MOUTH ONCE DAILY   beclomethasone 80 MCG/ACT inhaler Commonly known as:  QVAR Inhale 2 puffs into the lungs 2 (two) times daily.   furosemide 20 MG tablet Commonly known as:  LASIX TAKE ONE TABLET BY MOUTH ONCE DAILY   gabapentin 400 MG capsule Commonly known as:  NEURONTIN TAKE ONE CAPSULE BY MOUTH THREE TIMES DAILY   hydrALAZINE 50 MG tablet Commonly known as:  APRESOLINE Take 1 tablet (50 mg total) by mouth 3 (three) times daily.   insulin aspart 100 UNIT/ML FlexPen Commonly known as:  NOVOLOG FLEXPEN Inject 10 Units into the skin 3 (three) times daily with meals.   Insulin Glargine 100 UNIT/ML Solostar Pen Commonly known as:  LANTUS SOLOSTAR Inject 22 Units into the skin 2 (two) times daily at 8 am and 10 pm.   lisinopril 40 MG tablet Commonly known as:  PRINIVIL,ZESTRIL Take 1 tablet (40 mg total) by mouth daily.   nitroGLYCERIN 0.4 MG SL tablet Commonly known as:  NITROSTAT Place 1 tablet (0.4 mg total) under the tongue every 5 (five) minutes as needed for chest pain.   ondansetron 4 MG tablet Commonly known as:  ZOFRAN TAKE 1 TABLET (4 MG TOTAL) BY MOUTH ONCE AS NEEDED FOR NAUSEA OR  VOMITING   pantoprazole 40 MG tablet Commonly known as:  PROTONIX Take 1 tablet (40 mg total) by mouth daily.       Disposition and follow-up:   [name] was discharged from [facility] in Stable condition.  At the hospital follow up visit please address:  1.  Please recheck CBC. Hemoglobin stable 7-8 on discharge. Please ask about further episodes of GI bleed. Consider checking erythropoietin level. Anemia was felt to be multifactorial due to progression of his CKD and hemorrhoidal bleeding. Patient may benefit from EPO injections.   2.  Labs / imaging needed at time of follow-up: CBC  3.  Pending labs/ test needing follow-up: None  Follow-up Appointments: Follow-up Information    [place]. Go on 10/14/2016.   Why:  at 10:15 am for hospital follow up  Contact information: [address] [place] [facility] Course by problem list: Principal Problem:   Symptomatic anemia Active Problems:   HTN (hypertension)   Asthma, moderate persistent   Decreased hemoglobin   Melena   Anemia   1. Symptomatic Anemia: Patient presented from clinic with complaints of fatigue for 2 months and found to be anemic, hgb 6.4. He also endorsed SOB, dizziness, and dark stools. He was transfused 2 units pRBC with good response. Hemoglobin remained stable between  7-8 post transfusion. GI was consulted and he underwent EGD that was negative. Colonoscopy that showed some diverticuli and non-bleeding internal hemorrhoids. Anemia was felt to be multifactorial, likely due to progression of his CKD and hemorrhoidal bleeding. If he continues to experience GI bleeding or hgb continues to drop, capsule endoscopy may in indicated. He also may benefit from EPO injections, consider checking erythropoietin level.   Discharge Vitals:   BP 135/74 (BP Location: Right Arm)   Pulse 86   Temp 97.8 F (36.6 C) (Oral)    Resp 18   Ht $R'6\' 1"'XT$  (1.854 m)   Wt 248 lb 0.3 oz (112.5 kg)   SpO2 99%   BMI 32.72 kg/m   Pertinent Labs, Studies, and Procedures:   10/05/16 Upper Endoscopy:  Impression: - Normal esophagus. - Normal stomach. - Normal examined duodenum. - No specimens collected. - Normal examination ave for slight food retention and no cause of anemia Recommendations:  - Continue present medications. - Perform a colonoscopy tomorrow. - Clear liquid diet.  10/07/16 Colonoscopy: Impression: - Diverticulosis in the left colon. - Non-bleeding internal hemorrhoids. - The examined portion of the ileum was normal. - The examination was otherwise normal. - No specimens collected. Overall, suspect hemorrhoids may have prior stool test abnormality. EGD and colonoscopy negative for a bleeding source. I suspect anemia is multifactorial and perhaps largely driven by CKD. Recommendations: - Return patient to hospital ward for ongoing care. - Resume previous diet. - Continue present medications. - Repeat colonoscopy in 10 years for screening purposes. - GI will sign off for now - If symptoms of any GI bleeding in the future or continued downtrend in Hgb, can consider capsule endoscopy, but suspect majority of anemia may be due to CKD at this time  Labs:   Ref. Range 10/03/2016 16:17  Iron Latest Ref Range: 38 - 169 ug/dL 50  UIBC Latest Ref Range: 111 - 343 ug/dL 183  TIBC Latest Ref Range: 250 - 450 ug/dL 233 (L)  Ferritin Latest Ref Range: 30 - 400 ng/mL 269  Iron Saturation Latest Ref Range: 15 - 55 % 21    Discharge Instructions: Discharge Instructions    Call MD for:  extreme fatigue    Complete by:  As directed    Call MD for:  persistant dizziness or light-headedness    Complete by:  As directed    Call MD for:  persistant nausea and vomiting    Complete by:  As directed    Call MD for:  severe uncontrolled pain    Complete by:  As directed    Call MD for:  temperature >100.4     Complete by:  As directed    Diet - low sodium heart healthy    Complete by:  As directed    Discharge instructions    Complete by:  As directed    Please continue taking all of your medicines as previously prescribed. Please follow up in clinic for your scheduled appointment next week. If you have any questions or concerns, call our clinic at [phone] or after hours call [phone] and ask for the internal medicine resident on call. Thank you!   Increase activity slowly    Complete by:  As directed       Signed: Velna Ochs, MD 10/09/2016, 2:58 PM   Pager: [phone]

## 2016-10-07 NOTE — Anesthesia Procedure Notes (Signed)
Procedure Name: MAC Date/Time: 10/07/2016 10:58 AM Performed by: Lance Coon Pre-anesthesia Checklist: Patient identified, Suction available, Patient being monitored, Timeout performed and Emergency Drugs available Patient Re-evaluated:Patient Re-evaluated prior to inductionOxygen Delivery Method: Nasal cannula Intubation Type: IV induction

## 2016-10-07 NOTE — Anesthesia Preprocedure Evaluation (Signed)
Anesthesia Evaluation  Patient identified by MRN, date of birth, ID band Patient awake    Reviewed: Allergy & Precautions, NPO status , Patient's Chart, lab work & pertinent test results  Airway Mallampati: II  TM Distance: >3 FB Neck ROM: Full    Dental no notable dental hx.    Pulmonary former smoker,    Pulmonary exam normal breath sounds clear to auscultation       Cardiovascular hypertension, Normal cardiovascular exam Rhythm:Regular Rate:Normal     Neuro/Psych Seizures -, Well Controlled,  negative psych ROS   GI/Hepatic Neg liver ROS, GERD  ,  Endo/Other  diabetes  Renal/GU negative Renal ROS  negative genitourinary   Musculoskeletal negative musculoskeletal ROS (+)   Abdominal   Peds negative pediatric ROS (+)  Hematology negative hematology ROS (+)   Anesthesia Other Findings   Reproductive/Obstetrics negative OB ROS                             Anesthesia Physical Anesthesia Plan  ASA: III  Anesthesia Plan: MAC   Post-op Pain Management:    Induction: Intravenous  Airway Management Planned: Nasal Cannula  Additional Equipment:   Intra-op Plan:   Post-operative Plan:   Informed Consent: I have reviewed the patients History and Physical, chart, labs and discussed the procedure including the risks, benefits and alternatives for the proposed anesthesia with the patient or authorized representative who has indicated his/her understanding and acceptance.   Dental advisory given  Plan Discussed with: CRNA and Surgeon  Anesthesia Plan Comments:         Anesthesia Quick Evaluation

## 2016-10-07 NOTE — Progress Notes (Signed)
Patient discharge teaching given, including activity, diet, follow-up appoints, and medications. Patient verbalized understanding of all discharge instructions. IV access was d/c'd. Vitals are stable. Skin is intact except as charted in most recent assessments. Pt to be escorted out by NT, to be driven home by a taxi.  Chris-RN

## 2016-10-07 NOTE — Progress Notes (Signed)
Subjective: Patient reports multiple BM overnight with bowel prep. He has been NPO since midnight for colonoscopy today. He has no complaints.   Objective:  Vital signs in last 24 hours: Vitals:   10/06/16 0930 10/06/16 1649 10/06/16 2300 10/07/16 0702  BP: (!) 155/80 (!) 157/76 (!) 150/82 (!) 146/76  Pulse: 87 82 82 87  Resp: $Remo'18 18 18 16  'ufCgB$ Temp: 98.4 F (36.9 C) 98.1 F (36.7 C) 97.8 F (36.6 C) 97.9 F (36.6 C)  TempSrc: Oral Oral Oral Oral  SpO2: 96% 96% 97% 100%  Weight:      Height:       Physical Exam Constitutional: NAD, appears comfortable Cardiovascular: RRR, no murmurs, rubs, or gallops.  Pulmonary/Chest: CTAB, no wheezes, rales, or rhonchi.  Abdominal: Soft, non tender, non distended. +BS.  Extremities: Warm and well perfused. Distal pulses intact. No edema.  Neurological: A&Ox3, CN II - XII grossly intact.    Assessment/Plan:  Multifactorial Symptomatic anemia Patient presented from clinic with symptomatic anemia, hgb of  6.4 requiring 2 units pRBCs.  Hemoglobin has since remained stable above 7. This morning hgb 8.1. Patient reported a history of melena for the past 2-3 months with positive FOBT this admission. Patient also has chronic anemia likely from CKD. EGD per GI was unrevealing.  Colonoscopy was aborted due to poor prep, plan for repeat today.    -- GI following, appreciate recommendations -- Colonoscopy today  -- daily CBC --  transfuse less than 7.0 -- Continue PPI -- may benefit in the future from EPO once he gets established with nephrology  HTN (hypertension) His BP yesterday became more elevated throughout the day.  We have continued his amlodipine, Coreg, and Lasix thus far -- Hold Lisinopril given worsening CKD -- Continue Amlodipine, Coreg at BID dosing, and Lasix -- Restarted Hydralazine at lower than home dose of $Remov'25mg'VDaxFc$  TID.  Can increase back to $Remov'50mg'RWRMrw$  TID as BP tolerates  Chronic Kidney Disease, Stage IV Patient continues to have  worsening renal function in the setting of his uncontrolled DM and HTN. He has normocytic anemia and is likely worsened by his CKD along with blood loss. He needs to follow up with nephrology as has been planned as an outpatient since will likely benefit from EPO. -- Iron studies with low-normal iron and unexpectedly normal ferritin -- Renal function panel daily, creatinine 2.42 today (2.67 yesterday)  Hyperlipidemia associated with type 2 diabetes mellitus Lipid panel 10/04/2016 with TC 219, TG 321, HDL 40, LDL 115. He is on atorvastatin but has been out of this for 2 months. Will need refills upon discharge.  -- continue atorvastatin   Type I Diabetes, uncontrolled, with retinopathy  Hgb A1C was 7.4 on 10/03/2016. He is on Lantus 22units BID and Novolog 10units TID. Patient needs refills on his testing supplies upon discharge.  -- CBGs have been acceptable this admission -- Novolog 4 units TID with meals  -- Lantus 15 units subcutaneous QHS -- SSI   GERD Uncontrolled for the past several months as he has not had assess to his medications.  -- EGD unremarkable  -- continue PPI  Diastolic Congestive Heart Failure Patient has a LV EF of 60-65% with grade II diastolic dysfunction likely due to uncontrolled hypertension.  -- Continue Lasix $RemoveBefore'20mg'PIDprJORfKPaa$    Asthma, moderate persistent -- Continue Pulmicort BID  -- Proventil Q6H PRN  DVT PPx: SCD  Diet: clears, NPO for colonoscopy today  Dispo: Anticipated discharge today pending colonoscopy.   Velna Ochs, MD  10/07/2016, 8:48 AM Pager: [phone]

## 2016-10-07 NOTE — Op Note (Signed)
[facility] Patient Name: Jeremy Yu Procedure Date : 10/07/2016 MRN: [id number] Attending MD: Carlota Raspberry. Havery Moros , MD Date of Birth: Mar 17, 1966 CSN: [id number] Age: 50 Admit Type: Inpatient Procedure:                Colonoscopy Indications:              chronic anemia, (+) FOBT, dark stools Providers:                Carlota Raspberry. Havery Moros, MD, Cleda Daub, RN, Despina Pole Tech, Technician, Lance Coon, CRNA Referring MD:              Medicines:                Monitored Anesthesia Care Complications:            No immediate complications. Estimated blood loss:                            None. Estimated Blood Loss:     Estimated blood loss: none. Procedure:                Pre-Anesthesia Assessment:                           - Prior to the procedure, a History and Physical                            was performed, and patient medications and                            allergies were reviewed. The patient's tolerance of                            previous anesthesia was also reviewed. The risks                            and benefits of the procedure and the sedation                            options and risks were discussed with the patient.                            All questions were answered, and informed consent                            was obtained. Prior Anticoagulants: The patient has                            taken no previous anticoagulant or antiplatelet                            agents. ASA Grade Assessment: III - A patient with  severe systemic disease. After reviewing the risks                            and benefits, the patient was deemed in                            satisfactory condition to undergo the procedure.                           After obtaining informed consent, the colonoscope                            was passed under direct vision. Throughout the                             procedure, the patient's blood pressure, pulse, and                            oxygen saturations were monitored continuously. The                            EC-[id number] ([id number]) scope was introduced through                            the anus and advanced to the the terminal ileum,                            with identification of the appendiceal orifice and                            IC valve. The colonoscopy was performed without                            difficulty. The patient tolerated the procedure                            well. The quality of the bowel preparation was                            adequate. The terminal ileum, ileocecal valve,                            appendiceal orifice, and rectum were photographed. Scope In: 11:04:27 AM Scope Out: 11:17:00 AM Scope Withdrawal Time: 0 hours 10 minutes 3 seconds  Total Procedure Duration: 0 hours 12 minutes 33 seconds  Findings:      The perianal and digital rectal examinations were normal.      Multiple medium-mouthed diverticula were found in the left colon.      Non-bleeding internal hemorrhoids were found during retroflexion.      The terminal ileum appeared normal.      The exam was otherwise without abnormality. No polyps or mass lesions. Impression:               - Diverticulosis in the left colon.                           -  Non-bleeding internal hemorrhoids.                           - The examined portion of the ileum was normal.                           - The examination was otherwise normal.                           - No specimens collected.                           Overall, suspect hemorrhoids may have prior stool                            test abnormality. EGD and colonoscopy negative for                            a bleeding source. I suspect anemia is                            multifactorial and perhaps largely driven by CKD. Moderate Sedation:      No moderate sedation, case performed with  MAC Recommendation:           - Return patient to hospital ward for ongoing care.                           - Resume previous diet.                           - Continue present medications.                           - Repeat colonoscopy in 10 years for screening                            purposes.                           - GI will sign off for now                           - If symptoms of any GI bleeding in the future or                            continued downtrend in Hgb, can consider capsule                            endoscopy, but suspect majority of anemia may be                            due to CKD at this time Procedure Code(s):        --- Professional ---  45378, Colonoscopy, flexible; diagnostic, including                            collection of specimen(s) by brushing or washing,                            when performed (separate procedure) Diagnosis Code(s):        --- Professional ---                           K64.8, Other hemorrhoids                           D50.0, Iron deficiency anemia secondary to blood                            loss (chronic)                           K57.30, Diverticulosis of large intestine without                            perforation or abscess without bleeding CPT copyright 2016 American Medical Association. All rights reserved. The codes documented in this report are preliminary and upon coder review may  be revised to meet current compliance requirements. Remo Lipps P. Zacchary Pompei, MD 10/07/2016 11:29:25 AM This report has been signed electronically. Number of Addenda: 0

## 2016-10-07 NOTE — Anesthesia Postprocedure Evaluation (Signed)
Anesthesia Post Note  Patient: Jeremy Yu  Procedure(s) Performed: Procedure(s) (LRB): COLONOSCOPY (N/A)  Patient location during evaluation: PACU Anesthesia Type: MAC Level of consciousness: awake and alert Pain management: pain level controlled Vital Signs Assessment: post-procedure vital signs reviewed and stable Respiratory status: spontaneous breathing, nonlabored ventilation, respiratory function stable and patient connected to nasal cannula oxygen Cardiovascular status: stable and blood pressure returned to baseline Anesthetic complications: no    Last Vitals:  Vitals:   10/07/16 1130 10/07/16 1140  BP: 126/67 (!) 141/88  Pulse: 81 82  Resp: 15 19  Temp:      Last Pain:  Vitals:   10/07/16 1126  TempSrc: Oral  PainSc:                  Octavia Mottola S

## 2016-10-07 NOTE — Interval H&P Note (Signed)
History and Physical Interval Note:  10/07/2016 10:46 AM  Keturah Shavers  has presented today for surgery, with the diagnosis of melena  The various methods of treatment have been discussed with the patient and family. After consideration of risks, benefits and other options for treatment, the patient has consented to  Procedure(s) with comments: COLONOSCOPY (N/A) - would be ok with moderate I think as a surgical intervention .  The patient's history has been reviewed, patient examined, no change in status, stable for surgery.  I have reviewed the patient's chart and labs.  Questions were answered to the patient's satisfaction.     Renelda Loma Armbruster

## 2016-10-10 ENCOUNTER — Encounter (HOSPITAL_COMMUNITY): Payer: Self-pay | Admitting: Gastroenterology

## 2016-10-11 ENCOUNTER — Telehealth: Payer: Self-pay | Admitting: Internal Medicine

## 2016-10-11 NOTE — Telephone Encounter (Signed)
APT. REMINDER CALL, LMTCB °

## 2016-10-14 ENCOUNTER — Ambulatory Visit: Payer: Medicaid Other

## 2016-10-21 ENCOUNTER — Ambulatory Visit: Payer: Medicaid Other

## 2016-11-20 ENCOUNTER — Encounter (HOSPITAL_COMMUNITY): Payer: Self-pay | Admitting: *Deleted

## 2016-11-20 ENCOUNTER — Emergency Department (HOSPITAL_COMMUNITY): Payer: Medicaid Other

## 2016-11-20 DIAGNOSIS — Z833 Family history of diabetes mellitus: Secondary | ICD-10-CM

## 2016-11-20 DIAGNOSIS — R809 Proteinuria, unspecified: Secondary | ICD-10-CM | POA: Diagnosis present

## 2016-11-20 DIAGNOSIS — R339 Retention of urine, unspecified: Secondary | ICD-10-CM | POA: Diagnosis present

## 2016-11-20 DIAGNOSIS — E1142 Type 2 diabetes mellitus with diabetic polyneuropathy: Secondary | ICD-10-CM | POA: Diagnosis present

## 2016-11-20 DIAGNOSIS — Z7951 Long term (current) use of inhaled steroids: Secondary | ICD-10-CM

## 2016-11-20 DIAGNOSIS — J4540 Moderate persistent asthma, uncomplicated: Secondary | ICD-10-CM | POA: Diagnosis present

## 2016-11-20 DIAGNOSIS — K5900 Constipation, unspecified: Secondary | ICD-10-CM | POA: Diagnosis present

## 2016-11-20 DIAGNOSIS — E1169 Type 2 diabetes mellitus with other specified complication: Secondary | ICD-10-CM | POA: Diagnosis present

## 2016-11-20 DIAGNOSIS — Z880 Allergy status to penicillin: Secondary | ICD-10-CM

## 2016-11-20 DIAGNOSIS — I5043 Acute on chronic combined systolic (congestive) and diastolic (congestive) heart failure: Secondary | ICD-10-CM | POA: Diagnosis present

## 2016-11-20 DIAGNOSIS — Z794 Long term (current) use of insulin: Secondary | ICD-10-CM

## 2016-11-20 DIAGNOSIS — W208XXA Other cause of strike by thrown, projected or falling object, initial encounter: Secondary | ICD-10-CM | POA: Diagnosis present

## 2016-11-20 DIAGNOSIS — Z8711 Personal history of peptic ulcer disease: Secondary | ICD-10-CM

## 2016-11-20 DIAGNOSIS — Q5564 Hidden penis: Secondary | ICD-10-CM

## 2016-11-20 DIAGNOSIS — E1165 Type 2 diabetes mellitus with hyperglycemia: Secondary | ICD-10-CM | POA: Diagnosis present

## 2016-11-20 DIAGNOSIS — E1121 Type 2 diabetes mellitus with diabetic nephropathy: Secondary | ICD-10-CM | POA: Diagnosis present

## 2016-11-20 DIAGNOSIS — E784 Other hyperlipidemia: Secondary | ICD-10-CM | POA: Diagnosis present

## 2016-11-20 DIAGNOSIS — D631 Anemia in chronic kidney disease: Secondary | ICD-10-CM | POA: Diagnosis present

## 2016-11-20 DIAGNOSIS — N184 Chronic kidney disease, stage 4 (severe): Secondary | ICD-10-CM | POA: Diagnosis present

## 2016-11-20 DIAGNOSIS — Z7982 Long term (current) use of aspirin: Secondary | ICD-10-CM

## 2016-11-20 DIAGNOSIS — H547 Unspecified visual loss: Secondary | ICD-10-CM | POA: Diagnosis present

## 2016-11-20 DIAGNOSIS — Z821 Family history of blindness and visual loss: Secondary | ICD-10-CM

## 2016-11-20 DIAGNOSIS — Z79899 Other long term (current) drug therapy: Secondary | ICD-10-CM

## 2016-11-20 DIAGNOSIS — G43909 Migraine, unspecified, not intractable, without status migrainosus: Secondary | ICD-10-CM | POA: Diagnosis present

## 2016-11-20 DIAGNOSIS — Z6835 Body mass index (BMI) 35.0-35.9, adult: Secondary | ICD-10-CM

## 2016-11-20 DIAGNOSIS — Z87891 Personal history of nicotine dependence: Secondary | ICD-10-CM

## 2016-11-20 DIAGNOSIS — N179 Acute kidney failure, unspecified: Secondary | ICD-10-CM | POA: Diagnosis present

## 2016-11-20 DIAGNOSIS — R072 Precordial pain: Secondary | ICD-10-CM | POA: Diagnosis present

## 2016-11-20 DIAGNOSIS — M797 Fibromyalgia: Secondary | ICD-10-CM | POA: Diagnosis present

## 2016-11-20 DIAGNOSIS — Z8261 Family history of arthritis: Secondary | ICD-10-CM

## 2016-11-20 DIAGNOSIS — E1122 Type 2 diabetes mellitus with diabetic chronic kidney disease: Secondary | ICD-10-CM | POA: Diagnosis present

## 2016-11-20 DIAGNOSIS — Z8249 Family history of ischemic heart disease and other diseases of the circulatory system: Secondary | ICD-10-CM

## 2016-11-20 DIAGNOSIS — I130 Hypertensive heart and chronic kidney disease with heart failure and stage 1 through stage 4 chronic kidney disease, or unspecified chronic kidney disease: Principal | ICD-10-CM | POA: Diagnosis present

## 2016-11-20 DIAGNOSIS — R296 Repeated falls: Secondary | ICD-10-CM | POA: Diagnosis present

## 2016-11-20 DIAGNOSIS — Z86718 Personal history of other venous thrombosis and embolism: Secondary | ICD-10-CM

## 2016-11-20 DIAGNOSIS — E11319 Type 2 diabetes mellitus with unspecified diabetic retinopathy without macular edema: Secondary | ICD-10-CM | POA: Diagnosis present

## 2016-11-20 DIAGNOSIS — N5089 Other specified disorders of the male genital organs: Secondary | ICD-10-CM | POA: Diagnosis present

## 2016-11-20 DIAGNOSIS — K219 Gastro-esophageal reflux disease without esophagitis: Secondary | ICD-10-CM | POA: Diagnosis present

## 2016-11-20 LAB — BASIC METABOLIC PANEL WITH GFR
Anion gap: 6 (ref 5–15)
BUN: 32 mg/dL — ABNORMAL HIGH (ref 6–20)
CO2: 23 mmol/L (ref 22–32)
Calcium: 8.3 mg/dL — ABNORMAL LOW (ref 8.9–10.3)
Chloride: 112 mmol/L — ABNORMAL HIGH (ref 101–111)
Creatinine, Ser: 4.36 mg/dL — ABNORMAL HIGH (ref 0.61–1.24)
GFR calc Af Amer: 17 mL/min — ABNORMAL LOW
GFR calc non Af Amer: 14 mL/min — ABNORMAL LOW
Glucose, Bld: 148 mg/dL — ABNORMAL HIGH (ref 65–99)
Potassium: 4.1 mmol/L (ref 3.5–5.1)
Sodium: 141 mmol/L (ref 135–145)

## 2016-11-20 LAB — CBG MONITORING, ED: Glucose-Capillary: 148 mg/dL — ABNORMAL HIGH (ref 65–99)

## 2016-11-20 LAB — CBC
HCT: 26.3 % — ABNORMAL LOW (ref 39.0–52.0)
Hemoglobin: 8.3 g/dL — ABNORMAL LOW (ref 13.0–17.0)
MCH: 29.5 pg (ref 26.0–34.0)
MCHC: 31.6 g/dL (ref 30.0–36.0)
MCV: 93.6 fL (ref 78.0–100.0)
Platelets: 512 10*3/uL — ABNORMAL HIGH (ref 150–400)
RBC: 2.81 MIL/uL — ABNORMAL LOW (ref 4.22–5.81)
RDW: 14.1 % (ref 11.5–15.5)
WBC: 8.4 10*3/uL (ref 4.0–10.5)

## 2016-11-20 LAB — I-STAT TROPONIN, ED: TROPONIN I, POC: 0.01 ng/mL (ref 0.00–0.08)

## 2016-11-20 MED ORDER — ALBUTEROL SULFATE (2.5 MG/3ML) 0.083% IN NEBU
INHALATION_SOLUTION | RESPIRATORY_TRACT | Status: AC
  Filled 2016-11-20: qty 6

## 2016-11-20 MED ORDER — ALBUTEROL SULFATE (2.5 MG/3ML) 0.083% IN NEBU
5.0000 mg | INHALATION_SOLUTION | Freq: Once | RESPIRATORY_TRACT | Status: AC
  Administered 2016-11-20: 5 mg via RESPIRATORY_TRACT

## 2016-11-20 NOTE — ED Triage Notes (Addendum)
Pt c/o chest pain worsening tonight. Pt took one nitro with improvement of pain. Pt reports feeling dizzy earlier today and fell across a chair, chest pain started after falling. Also has had a cough x3 weeks and feels blood sugar is high

## 2016-11-21 ENCOUNTER — Inpatient Hospital Stay (HOSPITAL_COMMUNITY)
Admission: EM | Admit: 2016-11-21 | Discharge: 2016-11-28 | DRG: 291 | Disposition: A | Discharge status: Home / Self Care | Payer: Medicaid Other | Attending: Internal Medicine | Admitting: Internal Medicine

## 2016-11-21 ENCOUNTER — Inpatient Hospital Stay (HOSPITAL_COMMUNITY): Payer: Medicaid Other

## 2016-11-21 ENCOUNTER — Emergency Department (HOSPITAL_COMMUNITY): Payer: Medicaid Other

## 2016-11-21 DIAGNOSIS — Z794 Long term (current) use of insulin: Secondary | ICD-10-CM

## 2016-11-21 DIAGNOSIS — E1122 Type 2 diabetes mellitus with diabetic chronic kidney disease: Secondary | ICD-10-CM

## 2016-11-21 DIAGNOSIS — K5900 Constipation, unspecified: Secondary | ICD-10-CM

## 2016-11-21 DIAGNOSIS — E1142 Type 2 diabetes mellitus with diabetic polyneuropathy: Secondary | ICD-10-CM

## 2016-11-21 DIAGNOSIS — I509 Heart failure, unspecified: Secondary | ICD-10-CM | POA: Diagnosis not present

## 2016-11-21 DIAGNOSIS — I10 Essential (primary) hypertension: Secondary | ICD-10-CM | POA: Diagnosis present

## 2016-11-21 DIAGNOSIS — R072 Precordial pain: Secondary | ICD-10-CM | POA: Diagnosis present

## 2016-11-21 DIAGNOSIS — R748 Abnormal levels of other serum enzymes: Secondary | ICD-10-CM | POA: Diagnosis not present

## 2016-11-21 DIAGNOSIS — Z880 Allergy status to penicillin: Secondary | ICD-10-CM

## 2016-11-21 DIAGNOSIS — I5043 Acute on chronic combined systolic (congestive) and diastolic (congestive) heart failure: Secondary | ICD-10-CM | POA: Diagnosis present

## 2016-11-21 DIAGNOSIS — N189 Chronic kidney disease, unspecified: Secondary | ICD-10-CM | POA: Insufficient documentation

## 2016-11-21 DIAGNOSIS — H547 Unspecified visual loss: Secondary | ICD-10-CM | POA: Diagnosis present

## 2016-11-21 DIAGNOSIS — E1121 Type 2 diabetes mellitus with diabetic nephropathy: Secondary | ICD-10-CM | POA: Diagnosis present

## 2016-11-21 DIAGNOSIS — Z79899 Other long term (current) drug therapy: Secondary | ICD-10-CM

## 2016-11-21 DIAGNOSIS — R339 Retention of urine, unspecified: Secondary | ICD-10-CM | POA: Diagnosis present

## 2016-11-21 DIAGNOSIS — Z8249 Family history of ischemic heart disease and other diseases of the circulatory system: Secondary | ICD-10-CM

## 2016-11-21 DIAGNOSIS — K219 Gastro-esophageal reflux disease without esophagitis: Secondary | ICD-10-CM | POA: Diagnosis present

## 2016-11-21 DIAGNOSIS — E1165 Type 2 diabetes mellitus with hyperglycemia: Secondary | ICD-10-CM | POA: Diagnosis present

## 2016-11-21 DIAGNOSIS — J4540 Moderate persistent asthma, uncomplicated: Secondary | ICD-10-CM

## 2016-11-21 DIAGNOSIS — E8809 Other disorders of plasma-protein metabolism, not elsewhere classified: Secondary | ICD-10-CM | POA: Diagnosis not present

## 2016-11-21 DIAGNOSIS — IMO0002 Reserved for concepts with insufficient information to code with codable children: Secondary | ICD-10-CM | POA: Diagnosis present

## 2016-11-21 DIAGNOSIS — N184 Chronic kidney disease, stage 4 (severe): Secondary | ICD-10-CM | POA: Diagnosis present

## 2016-11-21 DIAGNOSIS — G43909 Migraine, unspecified, not intractable, without status migrainosus: Secondary | ICD-10-CM | POA: Diagnosis present

## 2016-11-21 DIAGNOSIS — E11319 Type 2 diabetes mellitus with unspecified diabetic retinopathy without macular edema: Secondary | ICD-10-CM

## 2016-11-21 DIAGNOSIS — Z960 Presence of urogenital implants: Secondary | ICD-10-CM | POA: Diagnosis not present

## 2016-11-21 DIAGNOSIS — Z7951 Long term (current) use of inhaled steroids: Secondary | ICD-10-CM

## 2016-11-21 DIAGNOSIS — I130 Hypertensive heart and chronic kidney disease with heart failure and stage 1 through stage 4 chronic kidney disease, or unspecified chronic kidney disease: Secondary | ICD-10-CM | POA: Diagnosis present

## 2016-11-21 DIAGNOSIS — N5089 Other specified disorders of the male genital organs: Secondary | ICD-10-CM | POA: Diagnosis present

## 2016-11-21 DIAGNOSIS — D631 Anemia in chronic kidney disease: Secondary | ICD-10-CM | POA: Diagnosis present

## 2016-11-21 DIAGNOSIS — Z809 Family history of malignant neoplasm, unspecified: Secondary | ICD-10-CM

## 2016-11-21 DIAGNOSIS — N179 Acute kidney failure, unspecified: Secondary | ICD-10-CM

## 2016-11-21 DIAGNOSIS — R05 Cough: Secondary | ICD-10-CM | POA: Diagnosis not present

## 2016-11-21 DIAGNOSIS — E784 Other hyperlipidemia: Secondary | ICD-10-CM | POA: Diagnosis present

## 2016-11-21 DIAGNOSIS — W208XXA Other cause of strike by thrown, projected or falling object, initial encounter: Secondary | ICD-10-CM | POA: Diagnosis present

## 2016-11-21 DIAGNOSIS — M797 Fibromyalgia: Secondary | ICD-10-CM | POA: Diagnosis present

## 2016-11-21 DIAGNOSIS — R55 Syncope and collapse: Secondary | ICD-10-CM

## 2016-11-21 DIAGNOSIS — Z833 Family history of diabetes mellitus: Secondary | ICD-10-CM

## 2016-11-21 DIAGNOSIS — E785 Hyperlipidemia, unspecified: Secondary | ICD-10-CM

## 2016-11-21 DIAGNOSIS — Z87891 Personal history of nicotine dependence: Secondary | ICD-10-CM

## 2016-11-21 DIAGNOSIS — R809 Proteinuria, unspecified: Secondary | ICD-10-CM | POA: Diagnosis present

## 2016-11-21 LAB — COMPREHENSIVE METABOLIC PANEL
ALT: 28 U/L (ref 17–63)
ANION GAP: 7 (ref 5–15)
AST: 41 U/L (ref 15–41)
Albumin: 2.2 g/dL — ABNORMAL LOW (ref 3.5–5.0)
Alkaline Phosphatase: 194 U/L — ABNORMAL HIGH (ref 38–126)
BILIRUBIN TOTAL: 0.5 mg/dL (ref 0.3–1.2)
BUN: 31 mg/dL — AB (ref 6–20)
CO2: 24 mmol/L (ref 22–32)
Calcium: 8.3 mg/dL — ABNORMAL LOW (ref 8.9–10.3)
Chloride: 113 mmol/L — ABNORMAL HIGH (ref 101–111)
Creatinine, Ser: 4.28 mg/dL — ABNORMAL HIGH (ref 0.61–1.24)
GFR calc Af Amer: 17 mL/min — ABNORMAL LOW (ref >60)
GFR, EST NON AFRICAN AMERICAN: 15 mL/min — AB (ref >60)
Glucose, Bld: 118 mg/dL — ABNORMAL HIGH (ref 65–99)
POTASSIUM: 3.8 mmol/L (ref 3.5–5.1)
Sodium: 144 mmol/L (ref 135–145)
TOTAL PROTEIN: 6.3 g/dL — AB (ref 6.5–8.1)

## 2016-11-21 LAB — TSH: TSH: 1.873 u[IU]/mL (ref 0.350–4.500)

## 2016-11-21 LAB — URINE MICROSCOPIC-ADD ON

## 2016-11-21 LAB — URINALYSIS, ROUTINE W REFLEX MICROSCOPIC
BILIRUBIN URINE: NEGATIVE
GLUCOSE, UA: 250 mg/dL — AB
KETONES UR: NEGATIVE mg/dL
LEUKOCYTES UA: NEGATIVE
Nitrite: NEGATIVE
Specific Gravity, Urine: 1.016 (ref 1.005–1.030)
pH: 6 (ref 5.0–8.0)

## 2016-11-21 LAB — GLUCOSE, CAPILLARY
GLUCOSE-CAPILLARY: 95 mg/dL (ref 65–99)
Glucose-Capillary: 105 mg/dL — ABNORMAL HIGH (ref 65–99)

## 2016-11-21 LAB — CBG MONITORING, ED
GLUCOSE-CAPILLARY: 118 mg/dL — AB (ref 65–99)
GLUCOSE-CAPILLARY: 127 mg/dL — AB (ref 65–99)

## 2016-11-21 LAB — BRAIN NATRIURETIC PEPTIDE: B NATRIURETIC PEPTIDE 5: 400.4 pg/mL — AB (ref 0.0–100.0)

## 2016-11-21 LAB — TROPONIN I

## 2016-11-21 MED ORDER — ATORVASTATIN CALCIUM 40 MG PO TABS
40.0000 mg | ORAL_TABLET | Freq: Every day | ORAL | Status: DC
  Administered 2016-11-21 – 2016-11-28 (×7): 40 mg via ORAL
  Filled 2016-11-21 (×8): qty 1

## 2016-11-21 MED ORDER — DICLOFENAC SODIUM 1 % TD GEL
2.0000 g | Freq: Three times a day (TID) | TRANSDERMAL | Status: DC
  Administered 2016-11-21 – 2016-11-28 (×27): 2 g via TOPICAL
  Filled 2016-11-21 (×2): qty 100

## 2016-11-21 MED ORDER — AMLODIPINE BESYLATE 10 MG PO TABS
10.0000 mg | ORAL_TABLET | Freq: Every day | ORAL | Status: DC
  Administered 2016-11-21 – 2016-11-24 (×4): 10 mg via ORAL
  Filled 2016-11-21 (×2): qty 1
  Filled 2016-11-21 (×2): qty 2
  Filled 2016-11-21 (×2): qty 1

## 2016-11-21 MED ORDER — ACETAMINOPHEN 650 MG RE SUPP: 650.0000 mg | Freq: Four times a day (QID) | RECTAL | Status: DC | PRN

## 2016-11-21 MED ORDER — FUROSEMIDE 10 MG/ML IJ SOLN
40.0000 mg | Freq: Two times a day (BID) | INTRAMUSCULAR | Status: DC
  Filled 2016-11-21: qty 4

## 2016-11-21 MED ORDER — FUROSEMIDE 10 MG/ML IJ SOLN
80.0000 mg | Freq: Once | INTRAMUSCULAR | Status: AC
  Administered 2016-11-21: 80 mg via INTRAVENOUS
  Filled 2016-11-21: qty 8

## 2016-11-21 MED ORDER — BENZONATATE 100 MG PO CAPS
200.0000 mg | ORAL_CAPSULE | Freq: Three times a day (TID) | ORAL | Status: DC | PRN
  Administered 2016-11-21 – 2016-11-27 (×13): 200 mg via ORAL
  Filled 2016-11-21 (×14): qty 2

## 2016-11-21 MED ORDER — FUROSEMIDE 10 MG/ML IJ SOLN: 20.0000 mg | Freq: Two times a day (BID) | INTRAMUSCULAR | Status: DC

## 2016-11-21 MED ORDER — BUDESONIDE 0.5 MG/2ML IN SUSP
0.5000 mg | Freq: Two times a day (BID) | RESPIRATORY_TRACT | Status: DC
  Administered 2016-11-21 – 2016-11-28 (×15): 0.5 mg via RESPIRATORY_TRACT
  Filled 2016-11-21 (×16): qty 2

## 2016-11-21 MED ORDER — POLYETHYLENE GLYCOL 3350 17 G PO PACK
17.0000 g | PACK | Freq: Every day | ORAL | Status: DC
  Administered 2016-11-22: 17 g via ORAL
  Filled 2016-11-21 (×2): qty 1

## 2016-11-21 MED ORDER — INSULIN GLARGINE 100 UNIT/ML ~~LOC~~ SOLN: 11.0000 [IU] | Freq: Every day | SUBCUTANEOUS | Status: DC

## 2016-11-21 MED ORDER — INSULIN ASPART 100 UNIT/ML ~~LOC~~ SOLN
4.0000 [IU] | Freq: Three times a day (TID) | SUBCUTANEOUS | Status: DC
  Administered 2016-11-21 – 2016-11-28 (×15): 4 [IU] via SUBCUTANEOUS
  Filled 2016-11-21 (×2): qty 1

## 2016-11-21 MED ORDER — ASPIRIN EC 81 MG PO TBEC
81.0000 mg | DELAYED_RELEASE_TABLET | Freq: Every day | ORAL | Status: DC
  Administered 2016-11-21 – 2016-11-28 (×8): 81 mg via ORAL
  Filled 2016-11-21 (×9): qty 1

## 2016-11-21 MED ORDER — INSULIN GLARGINE 100 UNIT/ML ~~LOC~~ SOLN
11.0000 [IU] | Freq: Two times a day (BID) | SUBCUTANEOUS | Status: DC
  Administered 2016-11-21 – 2016-11-28 (×13): 11 [IU] via SUBCUTANEOUS
  Filled 2016-11-21 (×18): qty 0.11

## 2016-11-21 MED ORDER — HEPARIN SODIUM (PORCINE) 5000 UNIT/ML IJ SOLN
5000.0000 [IU] | Freq: Three times a day (TID) | INTRAMUSCULAR | Status: DC
  Administered 2016-11-21 – 2016-11-28 (×23): 5000 [IU] via SUBCUTANEOUS
  Filled 2016-11-21 (×25): qty 1

## 2016-11-21 MED ORDER — BECLOMETHASONE DIPROPIONATE 80 MCG/ACT IN AERS: 2.0000 | INHALATION_SPRAY | Freq: Two times a day (BID) | RESPIRATORY_TRACT | Status: DC

## 2016-11-21 MED ORDER — INSULIN ASPART 100 UNIT/ML ~~LOC~~ SOLN
0.0000 [IU] | Freq: Three times a day (TID) | SUBCUTANEOUS | Status: DC
  Administered 2016-11-21: 2 [IU] via SUBCUTANEOUS

## 2016-11-21 MED ORDER — ACETAMINOPHEN 325 MG PO TABS
650.0000 mg | ORAL_TABLET | Freq: Four times a day (QID) | ORAL | Status: DC | PRN
  Administered 2016-11-21 – 2016-11-28 (×11): 650 mg via ORAL
  Filled 2016-11-21 (×11): qty 2

## 2016-11-21 MED ORDER — SENNA 8.6 MG PO TABS
1.0000 | ORAL_TABLET | Freq: Two times a day (BID) | ORAL | Status: DC
  Administered 2016-11-21 – 2016-11-23 (×5): 8.6 mg via ORAL
  Filled 2016-11-21 (×6): qty 1

## 2016-11-21 MED ORDER — SODIUM CHLORIDE 0.9% FLUSH
3.0000 mL | Freq: Two times a day (BID) | INTRAVENOUS | Status: DC
  Administered 2016-11-22 – 2016-11-28 (×13): 3 mL via INTRAVENOUS

## 2016-11-21 MED ORDER — ALBUTEROL SULFATE (2.5 MG/3ML) 0.083% IN NEBU
2.5000 mg | INHALATION_SOLUTION | Freq: Four times a day (QID) | RESPIRATORY_TRACT | Status: DC | PRN
  Administered 2016-11-24 – 2016-11-26 (×2): 2.5 mg via RESPIRATORY_TRACT
  Filled 2016-11-21 (×2): qty 3

## 2016-11-21 MED ORDER — ASPIRIN 325 MG PO TABS: 325.0000 mg | ORAL_TABLET | Freq: Once | ORAL | Status: DC

## 2016-11-21 MED ORDER — BENZONATATE 100 MG PO CAPS
200.0000 mg | ORAL_CAPSULE | Freq: Once | ORAL | Status: AC
  Administered 2016-11-21: 200 mg via ORAL
  Filled 2016-11-21: qty 2

## 2016-11-21 MED ORDER — HYDRALAZINE HCL 50 MG PO TABS
50.0000 mg | ORAL_TABLET | Freq: Three times a day (TID) | ORAL | Status: DC
  Administered 2016-11-21 – 2016-11-24 (×12): 50 mg via ORAL
  Filled 2016-11-21 (×6): qty 1
  Filled 2016-11-21: qty 2
  Filled 2016-11-21 (×5): qty 1

## 2016-11-21 MED ORDER — FUROSEMIDE 10 MG/ML IJ SOLN
80.0000 mg | Freq: Two times a day (BID) | INTRAMUSCULAR | Status: DC
  Administered 2016-11-21 – 2016-11-24 (×6): 80 mg via INTRAVENOUS
  Filled 2016-11-21 (×7): qty 8

## 2016-11-21 MED ORDER — GABAPENTIN 400 MG PO CAPS
400.0000 mg | ORAL_CAPSULE | Freq: Three times a day (TID) | ORAL | Status: DC
  Administered 2016-11-21 – 2016-11-28 (×22): 400 mg via ORAL
  Filled 2016-11-21 (×23): qty 1

## 2016-11-21 NOTE — ED Provider Notes (Signed)
[facility] DEPT Provider Note   CSN: [id number] Arrival date & time: 11/20/16  1928   By signing my name below, I, Delton Prairie, attest that this documentation has been prepared under the direction and in the presence of Everlene Balls, MD  Electronically Signed: Delton Prairie, ED Scribe. 11/21/16. 2:00 AM.   History   Chief Complaint Chief Complaint  Patient presents with  . Chest Pain   The history is provided by the patient. No language interpreter was used.   HPI Comments:  Jeremy Yu is a 50 y.o. male, with a hx of CHF, chronic renal failure, DM, HTN and hyperlipidemia, who presents to the Emergency Department complaining of sudden onset, worsening chest pain s/p a fall which occurred yesterday. Pt states he fell on a chair after which his symptoms began. Pt also endorses a cough x 3 weeks. He denies currently being on dialysis, a head injury, any other associated symptoms and modifying factors at this time. He is on Lasix.   Past Medical History:  Diagnosis Date  . Anxiety   . Asthma   . Blind in both eyes   . Chronic bronchitis ([place])   . Chronic renal failure    Sees [facility]/notes 05/14/2016  . Daily headache   . Depression   . Diabetic retinopathy ([place])    Archie Endo 11/02/2015  . DVT (deep venous thrombosis) ([place])    "? side" (10/03/2016)  . Falls frequently    "last fall was yesterday" (10/03/2016)  . Fibromyalgia   . GERD (gastroesophageal reflux disease)   . History of blood transfusion   . History of stomach ulcers   . Hyperlipidemia   . Hypertension   . Migraine    "weekly" (10/03/2016)  . Nephropathy    Archie Endo 11/02/2015  . Peripheral neuropathy ([place])    Ar[facility] Endo 11/02/2015  . Seizures ([place])    "light; none in the last 2 months; probably have 3/year" (10/03/2016)  . Symptomatic anemia 10/03/2016  . Type I diabetes mellitus ([place]) dx'd ~ 1987   Ar[facility] Endo 11/02/2015    Patient Active Problem List   Diagnosis Date Noted  . Decreased  hemoglobin 10/04/2016  . Anemia 10/04/2016  . Melena   . Symptomatic anemia 10/03/2016  . Hyperlipidemia associated with type 2 diabetes mellitus ([place]) 10/03/2016  . Anemia of chronic kidney failure, stage 4 (severe) ([place]) 10/03/2016  . Impaired mobility 06/21/2016  . Chronic kidney disease 03/17/2016  . HTN (hypertension) 10/19/2015  . DM type 2, uncontrolled, with retinopathy ([place]) 10/19/2015  . Chest pain 10/19/2015  . GERD (gastroesophageal reflux disease) 10/19/2015  . Asthma, moderate persistent 10/19/2015  . History of melena 10/19/2015  . Preventative health care 10/19/2015    Past Surgical History:  Procedure Laterality Date  . COLONOSCOPY N/A 10/06/2016   Procedure: COLONOSCOPY;  Surgeon: [name], MD;  Location: [facility];  Service: Endoscopy;  Laterality: N/A;  . COLONOSCOPY N/A 10/07/2016   Procedure: COLONOSCOPY;  Surgeon: Manus Gunning, MD;  Location: [facility];  Service: Gastroenterology;  Laterality: N/A;  would be ok with moderate I think  . ESOPHAGOGASTRODUODENOSCOPY N/A 10/05/2016   Procedure: ESOPHAGOGASTRODUODENOSCOPY (EGD);  Surgeon: [name], MD;  Location: [facility] ENDOSCOPY;  Service: Endoscopy;  Laterality: N/A;  . NO PAST SURGERIES      Home Medications    Prior to Admission medications   Medication Sig Start Date End Date Taking? Authorizing Provider  albuterol (PROVENTIL HFA;VENTOLIN HFA) 108 (90 Base) MCG/ACT inhaler Inhale 1-2 puffs into the  lungs every 6 (six) hours as needed for wheezing or shortness of breath. 03/14/16   Norval Gable, MD  amLODipine (NORVASC) 10 MG tablet Take 1 tablet (10 mg total) by mouth daily. 04/12/16 04/12/17  Norval Gable, MD  aspirin EC 81 MG tablet Take 1 tablet (81 mg total) by mouth daily. 03/14/16   Norval Gable, MD  atorvastatin (LIPITOR) 40 MG tablet TAKE ONE TABLET BY MOUTH ONCE DAILY 08/28/16   Collier Salina, MD  beclomethasone (QVAR) 80 MCG/ACT inhaler Inhale 2 puffs into the  lungs 2 (two) times daily. 10/19/15   Juliet Rude, MD  furosemide (LASIX) 20 MG tablet TAKE ONE TABLET BY MOUTH ONCE DAILY 08/28/16   Collier Salina, MD  gabapentin (NEURONTIN) 400 MG capsule TAKE ONE CAPSULE BY MOUTH THREE TIMES DAILY 08/28/16   Collier Salina, MD  hydrALAZINE (APRESOLINE) 50 MG tablet Take 1 tablet (50 mg total) by mouth 3 (three) times daily. 05/15/16   Josue Hector, MD  insulin aspart (NOVOLOG FLEXPEN) 100 UNIT/ML FlexPen Inject 10 Units into the skin 3 (three) times daily with meals. 06/21/16   Dellia Nims, MD  Insulin Glargine (LANTUS SOLOSTAR) 100 UNIT/ML Solostar Pen Inject 22 Units into the skin 2 (two) times daily at 8 am and 10 pm. 05/22/16   Norval Gable, MD  lisinopril (PRINIVIL,ZESTRIL) 40 MG tablet Take 1 tablet (40 mg total) by mouth daily. 03/14/16   Norval Gable, MD  nitroGLYCERIN (NITROSTAT) 0.4 MG SL tablet Place 1 tablet (0.4 mg total) under the tongue every 5 (five) minutes as needed for chest pain. 03/14/16   Norval Gable, MD  ondansetron (ZOFRAN) 4 MG tablet TAKE 1 TABLET (4 MG TOTAL) BY MOUTH ONCE AS NEEDED FOR NAUSEA OR VOMITING 07/03/16   Collier Salina, MD  pantoprazole (PROTONIX) 40 MG tablet Take 1 tablet (40 mg total) by mouth daily. 03/14/16   Norval Gable, MD    Family History Family History  Problem Relation Age of Onset  . Diabetes Mother   . Hypertension Mother   . Heart failure Mother   . Blindness Mother   . GER disease Mother   . Hypertension Father   . Cancer Father   . Arthritis Father     Social History Social History  Substance Use Topics  . Smoking status: Former Smoker    Types: Cigarettes  . Smokeless tobacco: Never Used     Comment: 10/03/2016 "smoked maybe 3 cigarettes/week; stopped when I was 18"  . Alcohol use No     Comment: 10/03/2016 "drank some in my younger days"     Allergies   Penicillins   Review of Systems Review of Systems  Respiratory: Positive for cough.     Cardiovascular: Positive for chest pain.  Neurological: Negative for headaches.  10 Systems reviewed and are negative for acute change except as noted in the HPI.  Physical Exam Updated Vital Signs BP 180/95 (BP Location: Right Arm)   Pulse 88   Temp 97.7 F (36.5 C) (Oral)   Resp 22   SpO2 100%   Physical Exam  Constitutional: He is oriented to person, place, and time. Vital signs are normal. He appears well-developed and well-nourished.  Non-toxic appearance. He does not appear ill. No distress.  HENT:  Head: Normocephalic and atraumatic.  Nose: Nose normal.  Mouth/Throat: Oropharynx is clear and moist. No oropharyngeal exudate.  Eyes: Conjunctivae and EOM are normal. Pupils are equal, round, and  reactive to light. No scleral icterus.  Neck: Normal range of motion. Neck supple. No tracheal deviation, no edema, no erythema and normal range of motion present. No thyroid mass and no thyromegaly present.  Cardiovascular: Normal rate, regular rhythm, S1 normal, S2 normal, normal heart sounds, intact distal pulses and normal pulses.  Exam reveals no gallop and no friction rub.   No murmur heard. Pulmonary/Chest: Effort normal and breath sounds normal. No respiratory distress. He has no wheezes. He has no rhonchi. He has no rales.  Abdominal: Soft. Normal appearance and bowel sounds are normal. He exhibits no distension, no ascites and no mass. There is no hepatosplenomegaly. There is no tenderness. There is no rebound, no guarding and no CVA tenderness.  Musculoskeletal: Normal range of motion. He exhibits no edema or tenderness.  Lymphadenopathy:    He has no cervical adenopathy.  Neurological: He is alert and oriented to person, place, and time. He has normal strength. No cranial nerve deficit or sensory deficit.  Skin: Skin is warm, dry and intact. No petechiae and no rash noted. He is not diaphoretic. No erythema. No pallor.  Nursing note and vitals reviewed.    ED Treatments /  Results  DIAGNOSTIC STUDIES:  Oxygen Saturation is 100% on RA, normal by my interpretation.    COORDINATION OF CARE:  1:57 AM Discussed treatment plan with pt at bedside and pt agreed to plan.  Labs (all labs ordered are listed, but only abnormal results are displayed) Labs Reviewed  BASIC METABOLIC PANEL - Abnormal; Notable for the following:       Result Value   Chloride 112 (*)    Glucose, Bld 148 (*)    BUN 32 (*)    Creatinine, Ser 4.36 (*)    Calcium 8.3 (*)    GFR calc non Af Amer 14 (*)    GFR calc Af Amer 17 (*)    All other components within normal limits  CBC - Abnormal; Notable for the following:    RBC 2.81 (*)    Hemoglobin 8.3 (*)    HCT 26.3 (*)    Platelets 512 (*)    All other components within normal limits  CBG MONITORING, ED - Abnormal; Notable for the following:    Glucose-Capillary 148 (*)    All other components within normal limits  BRAIN NATRIURETIC PEPTIDE  I-STAT TROPOININ, ED    EKG  EKG Interpretation  Date/Time:  Wednesday November 20 2016 19:35:43 EST Ventricular Rate:  88 PR Interval:  154 QRS Duration: 86 QT Interval:  380 QTC Calculation: 459 R Axis:   14 Text Interpretation:  Normal sinus rhythm Possible Anterior infarct , age undetermined Abnormal ECG Confirmed by RAY MD, Andee Poles [phone]) on 11/20/2016 9:34:06 PM       Radiology Dg Chest 2 View  Result Date: 11/20/2016 CLINICAL DATA:  Chest pain EXAM: CHEST  2 VIEW COMPARISON:  10/03/2016 FINDINGS: There are trace bilateral effusions best seen on the lateral view. There is mild cardiomegaly with mild central congestion and mild diffuse interstitial opacities suggesting edema. No consolidation. No pneumothorax. IMPRESSION: Mild cardiomegaly with mild central vascular congestion and mild interstitial edema. Suggestion of trace bilateral effusions. Electronically Signed   By: Donavan Foil M.D.   On: 11/20/2016 21:09    Procedures Procedures (including critical care  time)  Medications Ordered in ED Medications  albuterol (PROVENTIL) (2.5 MG/3ML) 0.083% nebulizer solution (  Canceled Entry 11/20/16 1943)  aspirin tablet 325 mg (not administered)  albuterol (  PROVENTIL) (2.5 MG/3ML) 0.083% nebulizer solution 5 mg (5 mg Nebulization Given 11/20/16 1940)     Initial Impression / Assessment and Plan / ED Course  I have reviewed the triage vital signs and the nursing notes.  Pertinent labs & imaging results that were available during my care of the patient were reviewed by me and considered in my medical decision making (see chart for details).  Clinical Course     Patient presents to the ED for recurrent syncope and fluid overload.  CT head is negative.  PE, CXR, and labs show some mild fluid overload, possibly related to his symptoms.  He was given IV lasix $Remove'80mg'HOSNvQI$  here.  Cr is up to 4, his baseline appears to be 2.  Patient will require admission to have further care.  I spoke with int med teaching service who will admit.  Final Clinical Impressions(s) / ED Diagnoses   Final diagnoses:  None    New Prescriptions New Prescriptions   No medications on file    I personally performed the services described in this documentation, which was scribed in my presence. The recorded information has been reviewed and is accurate.       Everlene Balls, MD 11/21/16 [phone]

## 2016-11-21 NOTE — ED Notes (Signed)
Patient transported to X-ray 

## 2016-11-21 NOTE — ED Notes (Signed)
Patient transported to Ultrasound 

## 2016-11-21 NOTE — Progress Notes (Signed)
   Subjective: No acute events since admission.  Very sleep, abdomen and scrotum painful.  Objective:  Vital signs in last 24 hours: Vitals:   11/21/16 1100 11/21/16 1130 11/21/16 1215 11/21/16 1230  BP: 172/93 179/97 168/95 165/94  Pulse: 92 90 87 86  Resp: $Remo'19 20 16 15  'BHmwi$ Temp:      TempSrc:      SpO2: 92% 97% 95% 95%  Weight:       Physical Exam  Constitutional: He is oriented to person, place, and time.  Sleepy man lying in stretcher looking uncomfortable  Cardiovascular: Normal rate and regular rhythm.   Pulmonary/Chest:  No respiratory distress Bibasilar crackles  Abdominal:  Obese Abdomen grossly edematous, brawny  Genitourinary:  Genitourinary Comments: Large scrotal edema  Neurological: He is alert and oriented to person, place, and time.  Psychiatric: He has a normal mood and affect. His behavior is normal.   CBC Latest Ref Rng & Units 11/20/2016 10/07/2016 10/06/2016  WBC 4.0 - 10.5 K/uL 8.4 7.7 7.2  Hemoglobin 13.0 - 17.0 g/dL 8.3(L) 8.1(L) 7.6(L)  Hematocrit 39.0 - 52.0 % 26.3(L) 25.2(L) 23.3(L)  Platelets 150 - 400 K/uL 512(H) 480(H) 390   BMP Latest Ref Rng & Units 11/21/2016 11/20/2016 10/07/2016  Glucose 65 - 99 mg/dL 118(H) 148(H) 83  BUN 6 - 20 mg/dL 31(H) 32(H) 13  Creatinine 0.61 - 1.24 mg/dL 4.28(H) 4.36(H) 2.45(H)  BUN/Creat Ratio 9 - 20 - - -  Sodium 135 - 145 mmol/L 144 141 142  Potassium 3.5 - 5.1 mmol/L 3.8 4.1 3.5  Chloride 101 - 111 mmol/L 113(H) 112(H) 110  CO2 22 - 32 mmol/L $RemoveB'24 23 25  'oHfCYwqg$ Calcium 8.9 - 10.3 mg/dL 8.3(L) 8.3(L) 8.5(L)     Assessment/Plan:  Active Problems:   Asthma, moderate persistent   Hyperlipidemia associated with type 2 diabetes mellitus (HCC)   Anemia of chronic kidney failure, stage 4 (severe) (HCC)   Acute on chronic congestive heart failure (HCC)   CKD (chronic kidney disease) stage 4, GFR 15-29 ml/min (HCC)   Acute on chronic renal failure ([place])  50 year old man with CHF presents grossly volume overloaded  with acute on chronic renal insufficiency.  Will first pursue aggressive diuresis.  #Acute on Chronic Renal Insufficiency Baseline Cr ~2.5? -IV lasix 80 mg BID -I/Os and daily weights -Renal US -Bladder scan -Consider Nephrology consult if UOP and renal function do not respond to diuresis  #CHF -Repeat Echo -I/Os and daily weights  #HTN Had not been taking previously prescribed Carvedilol PTA. -Continue hydralazine 50 mg TID and amlodipine 10 mg daily  #DM2 -Lantus 11U QHS -SSI  Dispo: Anticipated discharge in approximately 2-3 day(s).   Minus Liberty, MD 11/21/2016, 2:00 PM Pager: [phone]

## 2016-11-21 NOTE — Progress Notes (Signed)
Dr. Ernestine Conrad returned call and notified that foley attempt unsuccessful.  Also notified MD that pt scrotum and penial area swollen.  Karie Kirks, Therapist, sports.

## 2016-11-21 NOTE — H&P (Signed)
Date: 11/21/2016               Patient Name:  Jeremy Yu MRN: [id number]  DOB: 01-25-1966 Age / Sex: 50 y.o., male   PCP: Jule Ser, DO         Medical Service: Internal Medicine Teaching Service         Attending Physician: Dr. Axel Filler, MD    First Contact: Dr. Gay Filler, MD Pager: [phone]  Second Contact: Dr. Marlowe Sax, MD Pager: [phone]       After Hours (After 5p/  First Contact Pager: [phone]  weekends / holidays): Second Contact Pager: [phone]   Chief Complaint: cough, SOB, body swelling, decreased urine output.  History of Present Illness:  This is a 50 y/o M with Mhx significant for CKD, combined CHF, IDDM (neuropathy, nephropathy, retinopathy), HTN, HLD and asthma who presents for evaluation of cough, SOB and diffuse body swelling x3 weeks. Pt reports compliance with his medications including daily lasix 40 mg however despite this he has had progressive body swelling, cough and SOB. Reports he usually has 4-pillow orthopnea however is unable to lay in any position other than sitting upright due to cough/SOB. Hasn't been able to sleep well due to symptoms. Cough productive of scant clear mucous. Also reports feeling lightheaded after coughing fits resulting in dizziness/vertiginous symptoms and subsequent falls x2. Denied LOC. Endorses mid-sternal chest pain and abdominal tenderness after falling, both reproducible by palpation. Patient also reports he has been producing less urine than usual. Has been referred to outpatient nephology x2 however no-showed both appointments. Pt reports he is now interested in seeing nephrology for evaluation of his CKD. Dry weight 235 per patient, wt not recorded in ED. Weighs himself at home however doesn't believe hes gained weight. ROS also positive for fever (Sunday, broke on own), abdominal pain (fullness), constipation, BL LE pain up to hips (skin tightness) and fatigue. Denied diarrhea, diaphoresis, productive cough,  myalgias or sick contacts.   ED COURSE: Temp 97.7, pulse 88, BP 180/85, respirations 24 and was saturating 100% on RA. CXR positive for Mild cardiomegaly with mild central vascular congestion and mild interstitial edema. Suggestion of trace bilateral effusions. BNP 400. BMET significant for worsening renal fxn (Cr 4.3 vs 2.5 baseline, GFR 17 vs 30 baseline). CBC without leukocytosis however did show chronic anemia at 8.3 (baseline). He was given Albuterol nebs and tessalon with some improvement of his symptoms. IV Lasix 80 mg was ordered for pt by ED however was not administered before physical exam.   Meds:  Current Meds  Medication Sig  . albuterol (PROVENTIL HFA;VENTOLIN HFA) 108 (90 Base) MCG/ACT inhaler Inhale 1-2 puffs into the lungs every 6 (six) hours as needed for wheezing or shortness of breath.  Marland Kitchen amLODipine (NORVASC) 10 MG tablet Take 1 tablet (10 mg total) by mouth daily.  Marland Kitchen aspirin EC 81 MG tablet Take 1 tablet (81 mg total) by mouth daily.  Marland Kitchen atorvastatin (LIPITOR) 40 MG tablet TAKE ONE TABLET BY MOUTH ONCE DAILY  . beclomethasone (QVAR) 80 MCG/ACT inhaler Inhale 2 puffs into the lungs 2 (two) times daily.  . furosemide (LASIX) 20 MG tablet TAKE ONE TABLET BY MOUTH ONCE DAILY  . gabapentin (NEURONTIN) 400 MG capsule TAKE ONE CAPSULE BY MOUTH THREE TIMES DAILY  . hydrALAZINE (APRESOLINE) 50 MG tablet Take 1 tablet (50 mg total) by mouth 3 (three) times daily.  . insulin aspart (NOVOLOG FLEXPEN) 100 UNIT/ML FlexPen Inject 10 Units into the skin  3 (three) times daily with meals.  . Insulin Glargine (LANTUS SOLOSTAR) 100 UNIT/ML Solostar Pen Inject 22 Units into the skin 2 (two) times daily at 8 am and 10 pm.  . nitroGLYCERIN (NITROSTAT) 0.4 MG SL tablet Place 1 tablet (0.4 mg total) under the tongue every 5 (five) minutes as needed for chest pain.   Allergies: Allergies as of 11/20/2016 - Review Complete 11/20/2016  Allergen Reaction Noted  . Penicillins Shortness Of Breath and  Itching 03/01/2015   Past Medical History:  Diagnosis Date  . Anxiety   . Asthma   . Blind in both eyes   . Chronic bronchitis ([place])   . Chronic renal failure    Sees [facility]/notes 05/14/2016  . Daily headache   . Depression   . Diabetic retinopathy ([place])    Archie Endo 11/02/2015  . DVT (deep venous thrombosis) ([place])    "? side" (10/03/2016)  . Falls frequently    "last fall was yesterday" (10/03/2016)  . Fibromyalgia   . GERD (gastroesophageal reflux disease)   . History of blood transfusion   . History of stomach ulcers   . Hyperlipidemia   . Hypertension   . Migraine    "weekly" (10/03/2016)  . Nephropathy    Archie Endo 11/02/2015  . Peripheral neuropathy ([place])    Archie Endo 11/02/2015  . Seizures ([place])    "light; none in the last 2 months; probably have 3/year" (10/03/2016)  . Symptomatic anemia 10/03/2016  . Type I diabetes mellitus ([place]) dx'd ~ 1987   Archie Endo 11/02/2015   Family History: Mother: Deceased, DM,  HTN, HF. Father: Deceased: HTN, unspecified cancer Social History: Former smoker 30 years ago. Denied any alcohol use. Denied any drug use for several years. Was using marijuana. Review of Systems: A complete ROS was negative except as per HPI.  Physical Exam: Blood pressure 177/96, pulse 84, temperature 97.7 F (36.5 C), temperature source Oral, resp. rate 24, SpO2 99 %. General: Obese african [place] male in moderate distress. Coughing during nearly all of examination (dry), worsened by talking. Appears grossly fluid overloaded.  HENT: PERRL. EOMI. ?Mild conjunctival injection and icterus. No ptosis. Oropharynx clear, mucous membranes moist. Periorbital edema. Cardiovascular: Regular rate and rhythm. No murmur or rub appreciated. Mild chest wall discomfort on palpation. Pulmonary: Crackles present up to mid-lungs BL. No wheezing or rhonchi appreciated. Breathing labored, using accessory muscles.  Abdomen: Tense, distended abdomen. Mildly tender to palpation. No  guarding. Bowel sounds not appreciated.   Extremities: Peripheral edema noted in all 4 extremities. 3+ pitting edema of BL LE up to sacrum. Trace edema of BL UE up to elbow.  Skin: Warm, dry. No cyanosis. No blisters or apparent skin breaks.  Neuro: Strength and sensation grossly intact.  Psych: Pleasant. Good historian. Mood normal and affect was mood congruent. Responds to questions appropriately.   EKG: NSR rate 88, normal axis, no prolonged QT or PR.   CXR: Mild cardiomegaly with mild central vascular congestion and mild interstitial edema. Suggestion of trace bilateral effusions.  CT Head: No acute intracranial abn seen. Mild small vessel microangiopathy. Vaguely increased attenuation noted dependently within left optic globe.  Assessment & Plan by Problem: Active Problems:   Asthma, moderate persistent   Hyperlipidemia associated with type 2 diabetes mellitus (HCC)   Anemia of chronic kidney failure, stage 4 (severe) (HCC)   CHF exacerbation (HCC)   Combined congestive systolic and diastolic heart failure (HCC)   CKD (chronic kidney disease) stage 4, GFR 15-29 ml/min ([place])  Acute on chronic renal failure (HCC)  Acute on Chronic Renal Failure, Baseline CKD V. ?Cardiorenal Exacerbation of pts CKD: Cr 4.3 and GFR 17 with baseline of 25. Reports he's been making less urine for several weeks as well despite taking lasix 40 mg. He has been referred to nephrology twice however has no-showed to these appointments. Pt expressed desire to be seen by nephrology during examination.  -Consider consulting nephrology while admitted -Will monitor renal fxn with diuresis  Acute on Chronic Combined Systolic and Diastolic CHF ?Cardiorenal BNP 400, patient markedly fluid-up on exam. ECHO performed 06/2016 showed preserved EF however did demonstrate mild LVH + grade 2 diastolic dysfunction. Nuclear stress test performed same day showed an EF of 47% with mild diffuse hypokinesis. The patient reports he  takes 2 lasix pills a day however appears from med rec to only be prescribed once daily. Despite taking 40 mg PO lasix he continued to have progressive body swelling.  -IV Lasix 80 mg received in ED -IV Lasix 40 mg BID  -Daily weights, I&O's -Pt appeared to be on Coreg during recent admission however this medication appears to have "fallen off" his med rec and was not mentioned on his d/c summary. Holding coreg for now in setting of acute exacerbation.  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 Takes Lantus 22 units BID + Novolog 10 units TID -Continue Gabapentin 400 mg TID -Lantus 11 units BID + SSI  HTN Hypertensive in ED. Will monitor and make adjustments to home medications as needed. As mentioned earlier, Coreg has fallen off the med rec and this will need to be readdressed in AM. -Continue Hydralazine 50 mg TID -Continue Amlodipine 10 mg daily  Asthma, moderate persistent No PFTs in system.Takes Beclomethasone daily, albuterol PRN. -Pulmicort neb + Albuterol neb while admitted  Pre-Syncope Occurred twice after coughing fits. Described as feeling lightheaded with room spinning. CT head negative.  -Orthostats  HLD Continue Atorvastatin 40 mg  Constipation Last BM Sunday, small volume. Still passing gas.  -Senna, Miralax  DVT prophylaxis: Heparin Diet: HH, carb mod IVF: Code Status: FULL CODE  Dispo: Admit patient to Inpatient with expected length of stay greater than 2 midnights.  SignedEinar Gip, DO 11/21/2016, 4:57 AM  Pager: [phone]

## 2016-11-21 NOTE — ED Notes (Signed)
CBG 118 

## 2016-11-21 NOTE — Consult Note (Signed)
Urology Consult  Referring physician: Dr. Vincent Reason for referral: scrotal edema, difficult foley placement  Chief Complaint: scrotal pain, urinary urgency  History of Present Illness: Jeremy Yu is a 50yo with DMI, CKD, and CHF admitted with worsening lower extremity and scrotal edema. The scrotal edema has been worsening for the past month. He has never had this issue prior to this event. He had dull, constant, mild, nonradiating scrotal pan with no other associated symptoms. No exacerbating or alleviating events.  He has new urgency, frequency, and urinary incontinence which has been present for 2 weeks. His incontinence is exacerbated by his lasix he is taking daily. No hematuria or dysuria  Past Medical History:  Diagnosis Date  . Anxiety   . Asthma   . Blind in both eyes   . Chronic bronchitis (HCC)   . Chronic renal failure    Sees [facility]/notes 05/14/2016  . Daily headache   . Depression   . Diabetic retinopathy (HCC)    /notes 11/02/2015  . DVT (deep venous thrombosis) (HCC)    "? side" (10/03/2016)  . Falls frequently    "last fall was yesterday" (10/03/2016)  . Fibromyalgia   . GERD (gastroesophageal reflux disease)   . History of blood transfusion   . History of stomach ulcers   . Hyperlipidemia   . Hypertension   . Migraine    "weekly" (10/03/2016)  . Nephropathy    /notes 11/02/2015  . Peripheral neuropathy (HCC)    /notes 11/02/2015  . Seizures (HCC)    "light; none in the last 2 months; probably have 3/year" (10/03/2016)  . Symptomatic anemia 10/03/2016  . Type I diabetes mellitus (HCC) dx'd ~ 1987   /notes 11/02/2015   Past Surgical History:  Procedure Laterality Date  . COLONOSCOPY N/A 10/06/2016   Procedure: COLONOSCOPY;  Surgeon: Carl E Gessner, MD;  Location: [facility] END[facility]COPY;  Service: Endoscopy;  Laterality: N/A;  . COLONOSCOPY N/A 10/07/2016   Procedure: COLONOSCOPY;  Surgeon: Steven Paul Armbruster, MD;  Location: [facility] ENDOSCOPY;   Service: Gastroenterology;  Laterality: N/A;  would be ok with moderate I think  . ESOPHAGOGASTRODUODENOSCOPY N/A 10/05/2016   Procedure: ESOPHAGOGASTRODUODENOSCOPY (EGD);  Surgeon: Carl E Gessner, MD;  Location: [facility] ENDOSCOPY;  Service: Endoscopy;  Laterality: N/A;  . NO PAST SURGERIES      Medications: I have reviewed the patient's current medications. Allergies:  Allergies  Allergen Reactions  . Penicillins Shortness Of Breath and Itching    Has patient had a PCN reaction causing immediate rash, facial/tongue/throat swelling, SOB or lightheadedness with hypotension:YES Has patient had a PCN reaction causing severe rash involving mucus membranes or skin necrosis: NO Has patient had a PCN reaction that required hospitalization NO Has patient had a PCN reaction occurring within the last 10 years: NO If all of the above answers are "NO", then may proceed with Cephalosporin use.     Family History  Problem Relation Age of Onset  . Diabetes Mother   . Hypertension Mother   . Heart failure Mother   . Blindness Mother   . GER disease Mother   . Hypertension Father   . Cancer Father   . Arthritis Father    Social History:  reports that he has quit smoking. His smoking use included Cigarettes. He has never used smokeless tobacco. He reports that he uses drugs, including Marijuana. He reports that he does not drink alcohol.  Review of Systems  Constitutional: Positive for malaise/fatigue.  Eyes: Positive for blurred vision.    Respiratory: Positive for cough, sputum production, shortness of breath and wheezing.   Gastrointestinal: Positive for heartburn.  Genitourinary: Positive for frequency and urgency.  Musculoskeletal: Positive for joint pain.  All other systems reviewed and are negative.   Physical Exam:  Vital signs in last 24 hours: Temp:  [97.6 F (36.4 C)-97.9 F (36.6 C)] 97.9 F (36.6 C) (11/30 2007) Pulse Rate:  [84-100] 93 (11/30 2007) Resp:  [15-24] 18 (11/30  2007) BP: (144-187)/(74-98) 156/79 (11/30 2007) SpO2:  [86 %-100 %] 96 % (11/30 2007) Weight:  [116.3 kg (256 lb 6.4 oz)-132.5 kg (292 lb 1.8 oz)] 116.3 kg (256 lb 6.4 oz) (11/30 1445) Physical Exam  Constitutional: He is oriented to person, place, and time. He appears well-developed and well-nourished. No distress.  HENT:  Head: Normocephalic and atraumatic.  Eyes: Conjunctivae and EOM are normal. Pupils are equal, round, and reactive to light.  Neck: Normal range of motion. No thyromegaly present.  Cardiovascular: Normal rate.   Respiratory: Effort normal. No respiratory distress.  GI: Soft. He exhibits distension. He exhibits no mass. There is no tenderness. There is no rebound and no guarding. Hernia confirmed negative in the right inguinal area and confirmed negative in the left inguinal area.  Genitourinary: Testes normal.  Genitourinary Comments: Severe penile and scrotal edema. Glans unable to be palpated due to edema  Musculoskeletal: Normal range of motion. He exhibits edema.  Lymphadenopathy:       Right: No inguinal adenopathy present.       Left: No inguinal adenopathy present.  Neurological: He is alert and oriented to person, place, and time.  Skin: Skin is warm and dry.  Psychiatric: He has a normal mood and affect. His behavior is normal. Judgment and thought content normal.    Laboratory Data:  Results for orders placed or performed during the hospital encounter of 11/21/16 (from the past 72 hour(s))  Basic metabolic panel     Status: Abnormal   Collection Time: 11/20/16  7:47 PM  Result Value Ref Range   Sodium 141 135 - 145 mmol/L   Potassium 4.1 3.5 - 5.1 mmol/L   Chloride 112 (H) 101 - 111 mmol/L   CO2 23 22 - 32 mmol/L   Glucose, Bld 148 (H) 65 - 99 mg/dL   BUN 32 (H) 6 - 20 mg/dL   Creatinine, Ser 4.36 (H) 0.61 - 1.24 mg/dL   Calcium 8.3 (L) 8.9 - 10.3 mg/dL   GFR calc non Af Amer 14 (L) >60 mL/min   GFR calc Af Amer 17 (L) >60 mL/min    Comment:  (NOTE) The eGFR has been calculated using the CKD EPI equation. This calculation has not been validated in all clinical situations. eGFR's persistently <60 mL/min signify possible Chronic Kidney Disease.    Anion gap 6 5 - 15  CBC     Status: Abnormal   Collection Time: 11/20/16  7:47 PM  Result Value Ref Range   WBC 8.4 4.0 - 10.5 K/uL   RBC 2.81 (L) 4.22 - 5.81 MIL/uL   Hemoglobin 8.3 (L) 13.0 - 17.0 g/dL   HCT 26.3 (L) 39.0 - 52.0 %   MCV 93.6 78.0 - 100.0 fL   MCH 29.5 26.0 - 34.0 pg   MCHC 31.6 30.0 - 36.0 g/dL   RDW 14.1 11.5 - 15.5 %   Platelets 512 (H) 150 - 400 K/uL  CBG monitoring, ED     Status: Abnormal   Collection Time: 11/20/16  7:53 PM    Result Value Ref Range   Glucose-Capillary 148 (H) 65 - 99 mg/dL   Comment 1 Notify RN    Comment 2 Document in Chart   I-stat troponin, ED     Status: None   Collection Time: 11/20/16  8:13 PM  Result Value Ref Range   Troponin i, poc 0.01 0.00 - 0.08 ng/mL   Comment 3            Comment: Due to the release kinetics of cTnI, a negative result within the first hours of the onset of symptoms does not rule out myocardial infarction with certainty. If myocardial infarction is still suspected, repeat the test at appropriate intervals.   Brain natriuretic peptide     Status: Abnormal   Collection Time: 11/21/16  2:11 AM  Result Value Ref Range   B Natriuretic Peptide 400.4 (H) 0.0 - 100.0 pg/mL  Comprehensive metabolic panel     Status: Abnormal   Collection Time: 11/21/16  5:40 AM  Result Value Ref Range   Sodium 144 135 - 145 mmol/L   Potassium 3.8 3.5 - 5.1 mmol/L   Chloride 113 (H) 101 - 111 mmol/L   CO2 24 22 - 32 mmol/L   Glucose, Bld 118 (H) 65 - 99 mg/dL   BUN 31 (H) 6 - 20 mg/dL   Creatinine, Ser 4.28 (H) 0.61 - 1.24 mg/dL   Calcium 8.3 (L) 8.9 - 10.3 mg/dL   Total Protein 6.3 (L) 6.5 - 8.1 g/dL   Albumin 2.2 (L) 3.5 - 5.0 g/dL   AST 41 15 - 41 U/L   ALT 28 17 - 63 U/L   Alkaline Phosphatase 194 (H) 38 - 126  U/L   Total Bilirubin 0.5 0.3 - 1.2 mg/dL   GFR calc non Af Amer 15 (L) >60 mL/min   GFR calc Af Amer 17 (L) >60 mL/min    Comment: (NOTE) The eGFR has been calculated using the CKD EPI equation. This calculation has not been validated in all clinical situations. eGFR's persistently <60 mL/min signify possible Chronic Kidney Disease.    Anion gap 7 5 - 15  Troponin I     Status: None   Collection Time: 11/21/16  5:40 AM  Result Value Ref Range   Troponin I <0.03 <0.03 ng/mL  CBG monitoring, ED     Status: Abnormal   Collection Time: 11/21/16  9:22 AM  Result Value Ref Range   Glucose-Capillary 118 (H) 65 - 99 mg/dL  CBG monitoring, ED     Status: Abnormal   Collection Time: 11/21/16 12:38 PM  Result Value Ref Range   Glucose-Capillary 127 (H) 65 - 99 mg/dL  TSH     Status: None   Collection Time: 11/21/16  4:36 PM  Result Value Ref Range   TSH 1.873 0.350 - 4.500 uIU/mL    Comment: Performed by a 3rd Generation assay with a functional sensitivity of <=0.01 uIU/mL.  Glucose, capillary     Status: None   Collection Time: 11/21/16  4:56 PM  Result Value Ref Range   Glucose-Capillary 95 65 - 99 mg/dL  Urinalysis, Routine w reflex microscopic (not at [facility])     Status: Abnormal   Collection Time: 11/21/16  7:36 PM  Result Value Ref Range   Color, Urine YELLOW YELLOW   APPearance CLEAR CLEAR   Specific Gravity, Urine 1.016 1.005 - 1.030   pH 6.0 5.0 - 8.0   Glucose, UA 250 (A) NEGATIVE mg/dL   Hgb urine dipstick   MODERATE (A) NEGATIVE   Bilirubin Urine NEGATIVE NEGATIVE   Ketones, ur NEGATIVE NEGATIVE mg/dL   Protein, ur >300 (A) NEGATIVE mg/dL   Nitrite NEGATIVE NEGATIVE   Leukocytes, UA NEGATIVE NEGATIVE  Urine microscopic-add on     Status: Abnormal   Collection Time: 11/21/16  7:36 PM  Result Value Ref Range   Squamous Epithelial / LPF 0-5 (A) NONE SEEN   WBC, UA 0-5 0 - 5 WBC/hpf   RBC / HPF 0-5 0 - 5 RBC/hpf   Bacteria, UA RARE (A) NONE SEEN   Casts GRANULAR CAST  (A) NEGATIVE  Glucose, capillary     Status: Abnormal   Collection Time: 11/21/16  9:55 PM  Result Value Ref Range   Glucose-Capillary 105 (H) 65 - 99 mg/dL   Comment 1 Notify RN    Comment 2 Document in Chart    No results found for this or any previous visit (from the past 240 hour(s)). Creatinine:  Recent Labs  11/20/16 1947 11/21/16 0540  CREATININE 4.36* 4.28*   Baseline Creatinine: unknown  Foley Catheter Placemenet--Complex  Patient was prepped and draped in the usual sterile fashion using betadine solution. 2% viscous lidocaine was inserted per urethra. Using the cystoscopy we were able to locate the urethral meatus and then we advanced the cystoscope through the urethra and into the bladder. No masses or lesions were noted in the bladder. A 0.038 sensor was advanced per urethra without significant resistance or recoiling. Upon passage through the bladder neck with the wire urine was noted to emanate from the urethral meatus. A 18 Fr council catheter was placed via the Blitz technique and passed easily with immediate return of >1000 cc of clear, amber colored urine without clot. 10 cc sterile water was placed in the balloon port.    Impression/Assessment:  50yo with severe scrotal edema and urinary retention with difficult foley placment due to severe scrotal edema.   Plan:  1. 18 French foley placed with the aid of cystoscopy. Please continue foley catheter to straight drain. The foley should remain in place until his inpatient diuresis is complete and the scrotal edema has improved 2. Scrotal edema: Continue aggressive diuresis and scrotal support.    11/21/2016, 9:59 PM     

## 2016-11-21 NOTE — Progress Notes (Signed)
Pt oriented to room on arrival to floor this afternoon.  Call bell at bedside.  Instructed to call for assistance.  Also attempted to place a foley cath as ordered unsuccessful.  MD paged.  Karie Kirks, Therapist, sports.

## 2016-11-21 NOTE — Progress Notes (Signed)
Pt unable to stand for orthostatic VS ordered at 10 am in the ED.  Pt noted to be weak.  Will continue to monitor.  Karie Kirks, Therapist, sports.

## 2016-11-22 ENCOUNTER — Encounter (HOSPITAL_COMMUNITY): Payer: Self-pay | Admitting: Nurse Practitioner

## 2016-11-22 ENCOUNTER — Other Ambulatory Visit (HOSPITAL_COMMUNITY): Payer: Medicaid Other

## 2016-11-22 ENCOUNTER — Inpatient Hospital Stay (HOSPITAL_COMMUNITY): Payer: Medicaid Other

## 2016-11-22 DIAGNOSIS — I509 Heart failure, unspecified: Secondary | ICD-10-CM

## 2016-11-22 DIAGNOSIS — E1121 Type 2 diabetes mellitus with diabetic nephropathy: Secondary | ICD-10-CM

## 2016-11-22 DIAGNOSIS — Z960 Presence of urogenital implants: Secondary | ICD-10-CM

## 2016-11-22 DIAGNOSIS — R748 Abnormal levels of other serum enzymes: Secondary | ICD-10-CM

## 2016-11-22 LAB — ECHOCARDIOGRAM COMPLETE
Height: 73 in
Weight: 3702.4 oz

## 2016-11-22 LAB — BASIC METABOLIC PANEL
ANION GAP: 11 (ref 5–15)
BUN: 31 mg/dL — ABNORMAL HIGH (ref 6–20)
CALCIUM: 8.5 mg/dL — AB (ref 8.9–10.3)
CHLORIDE: 110 mmol/L (ref 101–111)
CO2: 23 mmol/L (ref 22–32)
Creatinine, Ser: 4.01 mg/dL — ABNORMAL HIGH (ref 0.61–1.24)
GFR calc non Af Amer: 16 mL/min — ABNORMAL LOW (ref >60)
GFR, EST AFRICAN AMERICAN: 19 mL/min — AB (ref >60)
Glucose, Bld: 103 mg/dL — ABNORMAL HIGH (ref 65–99)
Potassium: 3.7 mmol/L (ref 3.5–5.1)
Sodium: 144 mmol/L (ref 135–145)

## 2016-11-22 LAB — GLUCOSE, CAPILLARY
GLUCOSE-CAPILLARY: 95 mg/dL (ref 65–99)
GLUCOSE-CAPILLARY: 123 mg/dL — AB (ref 65–99)
GLUCOSE-CAPILLARY: 113 mg/dL — AB (ref 65–99)
GLUCOSE-CAPILLARY: 116 mg/dL — AB (ref 65–99)
GLUCOSE-CAPILLARY: 100 mg/dL — AB (ref 65–99)

## 2016-11-22 LAB — RPR: RPR Ser Ql: NONREACTIVE

## 2016-11-22 MED ORDER — PERFLUTREN LIPID MICROSPHERE
INTRAVENOUS | Status: AC
  Administered 2016-11-22: 2 mL
  Filled 2016-11-22: qty 10

## 2016-11-22 MED ORDER — POLYETHYLENE GLYCOL 3350 17 G PO PACK
17.0000 g | PACK | Freq: Two times a day (BID) | ORAL | Status: DC
  Administered 2016-11-22 – 2016-11-28 (×7): 17 g via ORAL
  Filled 2016-11-22 (×10): qty 1

## 2016-11-22 MED ORDER — INSULIN ASPART 100 UNIT/ML ~~LOC~~ SOLN
0.0000 [IU] | Freq: Three times a day (TID) | SUBCUTANEOUS | Status: DC
  Administered 2016-11-23 (×2): 2 [IU] via SUBCUTANEOUS
  Administered 2016-11-24 – 2016-11-25 (×2): 3 [IU] via SUBCUTANEOUS
  Administered 2016-11-25 – 2016-11-26 (×2): 2 [IU] via SUBCUTANEOUS
  Administered 2016-11-27: 1 [IU] via SUBCUTANEOUS

## 2016-11-22 MED ORDER — PERFLUTREN LIPID MICROSPHERE
1.0000 mL | INTRAVENOUS | Status: AC | PRN
  Filled 2016-11-22: qty 10

## 2016-11-22 NOTE — Progress Notes (Signed)
  Echocardiogram 2D Echocardiogram with Definity has been performed.  Tresa Res 11/22/2016, 12:38 PM

## 2016-11-22 NOTE — Progress Notes (Signed)
   Subjective: Patient had a Foley catheter placed by urology yesterday evening. No additional acute events overnight. Overall, he states he feels better this morning.  Objective:  Vital signs in last 24 hours: Vitals:   11/21/16 2352 11/22/16 0428 11/22/16 0759 11/22/16 1003  BP: (!) 178/86 (!) 191/88 (!) 168/85   Pulse: 99 97 97 86  Resp: $Remo'18 20 18 16  'gOADg$ Temp:  98.6 F (37 C) 98 F (36.7 C)   TempSrc:  Oral Oral   SpO2: 95% 93% 98% 97%  Weight:  231 lb 6.4 oz (105 kg)    Height:       Physical Exam  Constitutional: He is oriented to person, place, and time. He appears well-developed and well-nourished.  HENT:  Head: Normocephalic and atraumatic.  Jugular venous distention to the mandibular angle  Cardiovascular: Normal rate and regular rhythm.  Exam reveals no friction rub.   No murmur heard. Respiratory: Effort normal and breath sounds normal. No respiratory distress. He has no wheezes.  GI: Soft. Bowel sounds are normal. He exhibits no distension. There is no tenderness.  Genitourinary:  Genitourinary Comments: Scrotal edema remains prominent within extremely tender and swollen scrotum  Musculoskeletal: He exhibits edema.  2+ pitting edema  Neurological: He is alert and oriented to person, place, and time.     Assessment/Plan:  Active Problems:   Asthma, moderate persistent   Hyperlipidemia associated with type 2 diabetes mellitus (HCC)   Anemia of chronic kidney failure, stage 4 (severe) (HCC)   Acute on chronic congestive heart failure (HCC)   CKD (chronic kidney disease) stage 4, GFR 15-29 ml/min (HCC)   Acute on chronic kidney failure ([place])  50 year old gentleman with history of congestive heart failure presents grossly volume overloaded with acute on chronic renal insufficiency and congestive heart failure exacerbation.  1. Congestive heart failure exacerbation Patient presented grossly volume overloaded and he was started on IV Lasix 80 mg twice a day. A Foley  catheter was placed yesterday afternoon by urology and the patient has had great urine output with approximately 3.2 L in the last 24 hours. Renal ultrasound without evidence of hydronephrosis or obstruction. Urinalysis demonstrating proteinuria which may be secondary to the patient's long-standing history of diabetes. -- IV Lasix 80 mg twice a day -- I/Os and daily weight  2. Acute on chronic renal insufficiency The patient has long-standing renal insufficiency although his baseline creatinine is unclear at this time. Creatinine at admission was 4.28. His most recent creatinine has trended down to 4.01 with IV diuresis. The patient's urinalysis demonstrated proteinuria and casts which are consistent with acute tubular necrosis. I suspect the most likely etiology for the  acute worsening of his renal function is prerenal in etiology secondary to congestive heart failure exacerbation with cardiorenal syndrome. We will continue with IV diuresis and monitor his renal function. -- Continue IV diuresis -- Daily basic metabolic panel  3. Hypertension Patient's most recent blood pressure 178/86. He is currently on hydralazine 50 mg 3 times a day and amlodipine 10 mg daily. We will continue with his regimen and follow the patient's blood pressure as we continue with diuresis. -- Hydralazine 50 mg 3 times a day -- Amlodipine 10 mg daily -- IV furosemide  4. Diabetes -- Lantus 11 units twice a day -- Sliding scale insulin  DVT/PE prophylaxis: Heparin FEN/GI: Normal diet  Dispo: Anticipated discharge in approximately 1-2 day(s).   Ophelia Shoulder, MD 11/22/2016, 12:49 PM Pager: [phone]

## 2016-11-22 NOTE — Progress Notes (Signed)
Pt BS 123 mg/dl checked as instructed by doctor Lovena Le.  Pt easily arousable.  Stated did not sleep much last nite feel sleepy at this time.  Will continue to monitor.  Karie Kirks, Therapist, sports.

## 2016-11-23 DIAGNOSIS — K5900 Constipation, unspecified: Secondary | ICD-10-CM

## 2016-11-23 LAB — BASIC METABOLIC PANEL
Anion gap: 8 (ref 5–15)
BUN: 28 mg/dL — AB (ref 6–20)
CO2: 25 mmol/L (ref 22–32)
CREATININE: 3.72 mg/dL — AB (ref 0.61–1.24)
Calcium: 8.2 mg/dL — ABNORMAL LOW (ref 8.9–10.3)
Chloride: 111 mmol/L (ref 101–111)
GFR calc Af Amer: 20 mL/min — ABNORMAL LOW (ref >60)
GFR, EST NON AFRICAN AMERICAN: 18 mL/min — AB (ref >60)
GLUCOSE: 114 mg/dL — AB (ref 65–99)
POTASSIUM: 3.5 mmol/L (ref 3.5–5.1)
SODIUM: 144 mmol/L (ref 135–145)

## 2016-11-23 LAB — GLUCOSE, CAPILLARY
GLUCOSE-CAPILLARY: 90 mg/dL (ref 65–99)
Glucose-Capillary: 153 mg/dL — ABNORMAL HIGH (ref 65–99)
Glucose-Capillary: 123 mg/dL — ABNORMAL HIGH (ref 65–99)

## 2016-11-23 LAB — GAMMA GT: GGT: 63 U/L — AB (ref 7–50)

## 2016-11-23 MED ORDER — PHENOL 1.4 % MT LIQD
1.0000 | OROMUCOSAL | Status: DC | PRN
  Filled 2016-11-23: qty 177

## 2016-11-23 MED ORDER — BISACODYL 10 MG RE SUPP
10.0000 mg | Freq: Every day | RECTAL | Status: DC | PRN
  Administered 2016-11-23: 10 mg via RECTAL
  Filled 2016-11-23: qty 1

## 2016-11-23 MED ORDER — SENNOSIDES-DOCUSATE SODIUM 8.6-50 MG PO TABS
2.0000 | ORAL_TABLET | Freq: Every day | ORAL | Status: DC
  Administered 2016-11-25 – 2016-11-26 (×2): 2 via ORAL
  Filled 2016-11-23 (×4): qty 2

## 2016-11-23 NOTE — Progress Notes (Signed)
Instructed by Dr. Marlowe Sax to elevate pt;s scrotum with a towel.  Pt tolerated will.  Will continue to monitor.  Karie Kirks, Therapist, sports.

## 2016-11-23 NOTE — Evaluation (Signed)
Physical Therapy Evaluation Patient Details Name: Jeremy Yu MRN: [id number] DOB: 01-Sep-1966 Today's Date: 11/23/2016   History of Present Illness  Pt is a 50 y/o male admitted secondary to SOB and increased swelling. PMH including but not limited to CKD, CHF, seizures, IDDM (with retinopathy, nephropathy and neuropathy).  Clinical Impression  Pt presented supine in bed with HOB elevated, awake and willing to participate in therapy session. Prior to admission, pt reported that he was mod I with functional mobility, using a RW to ambulate. Pt also reported that he was independent with dressing and bathing. Pt required min-mod A for bed mobility. He was very limited this session secondary to pain and fatigue. Pt would continue to benefit from skilled physical therapy services at this time while admitted and after d/c to address his below listed limitations in order to improve his overall safety and independence with functional mobility.     Follow Up Recommendations SNF;Supervision/Assistance - 24 hour    Equipment Recommendations  None recommended by PT    Recommendations for Other Services       Precautions / Restrictions Precautions Precautions: Fall Restrictions Weight Bearing Restrictions: No      Mobility  Bed Mobility Overal bed mobility: Needs Assistance Bed Mobility: Supine to Sit;Sit to Supine     Supine to sit: Min assist;HOB elevated Sit to supine: Mod assist   General bed mobility comments: pt required increased time, use of bed rails, min A to assist with bilateral LEs off of the bed and then mod A with bilateral LEs back onto the bed  Transfers                 General transfer comment: NT secondary to pain and increased fatigue as pt had recently stood up with two nurse tech's prior to PT entering room  Ambulation/Gait                Stairs            Wheelchair Mobility    Modified Rankin (Stroke Patients Only)       Balance  Overall balance assessment: Needs assistance Sitting-balance support: Feet supported;Bilateral upper extremity supported Sitting balance-Leahy Scale: Poor Sitting balance - Comments: pt unable to sit without bilateral UEs supports, no physical assist needed from therapist Postural control: Posterior lean (secondary to pain)                                   Pertinent Vitals/Pain Pain Assessment: No/denies pain    Home Living Family/patient expects to be discharged to:: Private residence Living Arrangements: Non-relatives/Friends (his "pastor") Available Help at Discharge: Friend(s);Available PRN/intermittently   Home Access: Stairs to enter Entrance Stairs-Rails: None Entrance Stairs-Number of Steps: 2 Home Layout: One level Home Equipment: Walker - 2 wheels      Prior Function Level of Independence: Independent with assistive device(s)         Comments: pt reported that he ambulates with use of RW, independent with dressing and bathing     Hand Dominance        Extremity/Trunk Assessment   Upper Extremity Assessment: Overall WFL for tasks assessed           Lower Extremity Assessment: Generalized weakness         Communication   Communication: No difficulties  Cognition Arousal/Alertness: Lethargic Behavior During Therapy: WFL for tasks assessed/performed Overall Cognitive Status: Within Functional Limits for tasks  assessed                      General Comments      Exercises     Assessment/Plan    PT Assessment Patient needs continued PT services  PT Problem List Decreased strength;Decreased range of motion;Decreased activity tolerance;Decreased balance;Decreased coordination;Decreased mobility;Cardiopulmonary status limiting activity;Pain          PT Treatment Interventions DME instruction;Gait training;Stair training;Functional mobility training;Therapeutic activities;Therapeutic exercise;Balance training;Neuromuscular  re-education;Patient/family education    PT Goals (Current goals can be found in the Care Plan section)  Acute Rehab PT Goals Patient Stated Goal: return home PT Goal Formulation: With patient Time For Goal Achievement: 12/07/16 Potential to Achieve Goals: Fair    Frequency Min 3X/week   Barriers to discharge        Co-evaluation               End of Session   Activity Tolerance: Patient limited by pain;Patient limited by fatigue Patient left: in bed;with call bell/phone within reach;with bed alarm set Nurse Communication: Mobility status         Time: 0712-1975 PT Time Calculation (min) (ACUTE ONLY): 22 min   Charges:   PT Evaluation $PT Eval Moderate Complexity: 1 Procedure     PT G CodesClearnce Sorrel Jame Morrell 11/23/2016, 1:28 PM Sherie Don, [place], DPT [phone]

## 2016-11-23 NOTE — Progress Notes (Signed)
Subjective: No acute overnight events. Patient states his dyspnea and scrotal discomfort have improved since admission. Reports having constipation.  Objective:  Vital signs in last 24 hours: Vitals:   11/23/16 0740 11/23/16 0916 11/23/16 1016 11/23/16 1130  BP: (!) 161/92  (!) 179/73   Pulse: 86     Resp:      Temp:      TempSrc:      SpO2:  99%    Weight:    267 lb 6.7 oz (121.3 kg)  Height:       Physical Exam  Constitutional: He is oriented to person, place, and time. He appears well-developed and well-nourished.  HENT:  Head: Normocephalic and atraumatic.  Cardiovascular: Normal rate, regular rhythm and intact distal pulses.  Exam reveals no friction rub.   No murmur heard. Respiratory: Effort normal and breath sounds normal. No respiratory distress. He has no rales.  Very mild end-expiratory wheezing.   GI: Soft. Bowel sounds are normal. He exhibits no distension. There is no tenderness.  Genitourinary:  Genitourinary Comments: Scrotal edema remains prominent.  Musculoskeletal: He exhibits edema.  2+ pitting edema  Neurological: He is alert and oriented to person, place, and time.    Assessment/Plan:  Active Problems:   Asthma, moderate persistent   Hyperlipidemia associated with type 2 diabetes mellitus (HCC)   Anemia of chronic kidney failure, stage 4 (severe) (HCC)   Acute on chronic congestive heart failure (HCC)   CKD (chronic kidney disease) stage 4, GFR 15-29 ml/min (HCC)   Acute on chronic kidney failure ([place])  50 year old gentleman with history of congestive heart failure presents grossly volume overloaded with acute on chronic renal insufficiency and congestive heart failure exacerbation.  1. Acute exacerbation of diastolic congestive heart failure Patient is still volume overloaded, however, continues to diurese well. Net output -5.9L since admission.  --Continue IV Lasix 80 mg twice a day -- I/Os and daily weight  2. Acute kidney injury on CKD  stage 4 Cr 3.7 today; improved from >4 on admission. His serum creatinine has been worsening over the past year. It was 2.4 two weeks prior to admission. We will continue with IV diuresis and monitor his renal function. -- Continue IV diuresis -- Daily basic metabolic panel  3. Hypertension BP continues to be elevated with systolic 417E-081K and diastolic 48J-85U.  -- Increase dose of Hydralazine to 50 mg 4 times a day -- Amlodipine 10 mg daily -- IV furosemide  4. Uncontrolled type 2 diabetes with diabetic retinopathy -- Lantus 11 units twice a day -- Sliding scale insulin  5. Asthma Noted to have very mild end expiratory wheezing on exam. Patient is not complaining of any dyspnea.  --Albuterol nebs q6 hrs   6. Constipation --Dulcolax suppository  --Miralax prn --Senokot-S at bedtime   7. Elevated alkaline phosphatase Alkaline phosphatase elevated at 194; it was normal prior to this hospitalization. AST and ALT normal. GGT elevated at 63. Patient is currently asymptomatic - no abdominal pain, nausea, or vomiting. Lab findings likely due to congestive hepatopathy in the setting of heart failure exacerbation.  --Suggest repeating LFTs at outpatient. If this lab abnormality persists, suggest RUQ ultrasound to look for dilated bile ducts.   8. Scrotal edema  Likely due to fluid overload in the setting of heart failure exacerbation. Patient has a foley catheter in place. --Scrotal elevation --Continue foley until edema resolves   DVT/PE prophylaxis: Heparin FEN/GI: Normal diet  Dispo: Anticipated discharge in approximately 1-2 day(s).  Shela Leff, MD 11/23/2016, 11:48 AM Pager: [phone]

## 2016-11-23 NOTE — Progress Notes (Signed)
BS '123mg'$ /dl noted in the machine done this am.  Noted coming up on the work list.  Karie Kirks, RN

## 2016-11-24 DIAGNOSIS — R05 Cough: Secondary | ICD-10-CM

## 2016-11-24 LAB — BASIC METABOLIC PANEL
Anion gap: 10 (ref 5–15)
BUN: 27 mg/dL — AB (ref 6–20)
CALCIUM: 8.2 mg/dL — AB (ref 8.9–10.3)
CO2: 22 mmol/L (ref 22–32)
CREATININE: 3.56 mg/dL — AB (ref 0.61–1.24)
Chloride: 111 mmol/L (ref 101–111)
GFR calc Af Amer: 21 mL/min — ABNORMAL LOW (ref >60)
GFR, EST NON AFRICAN AMERICAN: 19 mL/min — AB (ref >60)
GLUCOSE: 99 mg/dL (ref 65–99)
Potassium: 4 mmol/L (ref 3.5–5.1)
Sodium: 143 mmol/L (ref 135–145)

## 2016-11-24 LAB — GLUCOSE, CAPILLARY
GLUCOSE-CAPILLARY: 119 mg/dL — AB (ref 65–99)
GLUCOSE-CAPILLARY: 169 mg/dL — AB (ref 65–99)
GLUCOSE-CAPILLARY: 142 mg/dL — AB (ref 65–99)
Glucose-Capillary: 94 mg/dL (ref 65–99)

## 2016-11-24 MED ORDER — GUAIFENESIN ER 600 MG PO TB12
600.0000 mg | ORAL_TABLET | Freq: Two times a day (BID) | ORAL | Status: DC
  Administered 2016-11-24 – 2016-11-28 (×9): 600 mg via ORAL
  Filled 2016-11-24 (×9): qty 1

## 2016-11-24 NOTE — Progress Notes (Signed)
Alert and oriented with excessive coughing through-out the day, MD aware, Duo nebs given, Missed one dose of IV lasix d/t  difficult IV stick.continuing in diurese.

## 2016-11-24 NOTE — Progress Notes (Signed)
Subjective: Patient slept well overnight. However, this morning he continues to complain of cough with sputum production that is green. Overall, he states that he is feeling improved but still endorses significant pain in the scrotal area.  Objective:  Vital signs in last 24 hours: Vitals:   11/23/16 2116 11/23/16 2131 11/24/16 0430 11/24/16 0715  BP:  (!) 169/91 (!) 166/81   Pulse:  87 99   Resp:  17 18   Temp:  98.6 F (37 C) 99.5 F (37.5 C)   TempSrc:  Oral Oral   SpO2: 98% 97% 95% 97%  Weight:   277 lb 11.2 oz (126 kg)   Height:       Physical Exam  Constitutional: He is oriented to person, place, and time. He appears well-developed.  HENT:  Head: Normocephalic and atraumatic.  Cardiovascular: Normal rate and regular rhythm.  Exam reveals no gallop and no friction rub.   No murmur heard. Respiratory:  Patient has expiratory wheezes. Overall, I did not appreciate any crackles however it appeared that he was moving small volumes of air.  GI: Soft. Bowel sounds are normal.  Musculoskeletal: He exhibits edema.  Neurological: He is alert and oriented to person, place, and time.     Assessment/Plan:  Active Problems:   HTN (hypertension)   DM type 2, uncontrolled, with retinopathy (HCC)   Asthma, moderate persistent   Anemia of chronic kidney failure, stage 4 (severe) (HCC)   Acute on chronic congestive heart failure (HCC)   Constipation  1. Congestive heart failure exacerbation Patient's volume status is improving. Overall, he appears less edematous than prior examinations. He has had great urine output and is net -8.5 L from admission. Overall, he states that he feels much better. He continues to have severe scrotal edema but this also appears improved from prior examinations. I think he will benefit from at least 1 possibly 2 more days of continued diuresis. -- IV Lasix 80 mg twice a day -- I/Os and daily weight -- Renal function panel tomorrow  2. Acute on  chronic renal insufficiency The patient's baseline creatinine ranges from 2.4-2.67. On admission his creatinine was elevated at 4.36. Most likely etiology for this acute on chronic renal insufficiency is prerenal secondary to cardiorenal syndrome. His most recent creatinine is 3.56 and I would suspect he will continue to have improvements in his creatinine with continued diuresis. Overall, his electrolytes are within appropriate ranges and we will continue to monitor with a renal function panel tomorrow. -- Continue IV diuresis -- Renal function panel  3. Hypertension Most recent blood pressure of 166/81. Yesterday, his hydralazine was increased from 50 mg 3 times a day to 50 mg 4 times daily. We will continue with his antihypertensive regimen and with diuresis. -- Hydralazine 50 mg 4 times a day -- Amlodipine 10 mg daily -- IV furosemide  4. Diabetes -- Lantus 11 units twice a day -- NovoLog 4 units 3 times a day with meals -- Sliding scale insulin  5. Productive cough The patient has complained of productive cough. He is making minimal to moderate amounts of green sputum. His chest x-ray on admission did not show evidence of pneumonia. He is afebrile. At this time I do not think he is infected but we will continue to monitor and if he becomes febrile we will consider reimaging his chest. --Mucinex $RemoveBeforeDE'600mg'[id number]$  twice a day  DVT/PE prophylaxis: Heparin FEN/GI: Normal diet  Dispo: Anticipated discharge in approximately 1-2 days.   Jeneen Rinks  Lovena Le, MD 11/24/2016, 9:13 AM Pager: [phone]

## 2016-11-25 DIAGNOSIS — E8809 Other disorders of plasma-protein metabolism, not elsewhere classified: Secondary | ICD-10-CM

## 2016-11-25 LAB — RENAL FUNCTION PANEL
ALBUMIN: 2.1 g/dL — AB (ref 3.5–5.0)
Anion gap: 7 (ref 5–15)
BUN: 28 mg/dL — AB (ref 6–20)
CALCIUM: 8.2 mg/dL — AB (ref 8.9–10.3)
CO2: 24 mmol/L (ref 22–32)
CREATININE: 3.53 mg/dL — AB (ref 0.61–1.24)
Chloride: 111 mmol/L (ref 101–111)
GFR calc Af Amer: 22 mL/min — ABNORMAL LOW (ref >60)
GFR, EST NON AFRICAN AMERICAN: 19 mL/min — AB (ref >60)
Glucose, Bld: 127 mg/dL — ABNORMAL HIGH (ref 65–99)
PHOSPHORUS: 4.1 mg/dL (ref 2.5–4.6)
POTASSIUM: 3.8 mmol/L (ref 3.5–5.1)
SODIUM: 142 mmol/L (ref 135–145)

## 2016-11-25 LAB — GLUCOSE, CAPILLARY
GLUCOSE-CAPILLARY: 123 mg/dL — AB (ref 65–99)
GLUCOSE-CAPILLARY: 159 mg/dL — AB (ref 65–99)
GLUCOSE-CAPILLARY: 124 mg/dL — AB (ref 65–99)
Glucose-Capillary: 126 mg/dL — ABNORMAL HIGH (ref 65–99)
Glucose-Capillary: 119 mg/dL — ABNORMAL HIGH (ref 65–99)

## 2016-11-25 LAB — CALCITRIOL (1,25 DI-OH VIT D): Vit D, 1,25-Dihydroxy: 6.8 pg/mL — ABNORMAL LOW (ref 19.9–79.3)

## 2016-11-25 MED ORDER — FUROSEMIDE 10 MG/ML IJ SOLN
80.0000 mg | Freq: Two times a day (BID) | INTRAMUSCULAR | Status: DC
  Administered 2016-11-25 – 2016-11-26 (×3): 80 mg via INTRAVENOUS
  Filled 2016-11-25 (×3): qty 8

## 2016-11-25 MED ORDER — VERAPAMIL HCL ER 240 MG PO TBCR
240.0000 mg | EXTENDED_RELEASE_TABLET | Freq: Every day | ORAL | Status: DC
  Administered 2016-11-25 – 2016-11-27 (×3): 240 mg via ORAL
  Filled 2016-11-25 (×3): qty 1

## 2016-11-25 MED ORDER — HYDRALAZINE HCL 50 MG PO TABS
100.0000 mg | ORAL_TABLET | Freq: Three times a day (TID) | ORAL | Status: DC
  Administered 2016-11-25 – 2016-11-27 (×6): 100 mg via ORAL
  Filled 2016-11-25 (×7): qty 2

## 2016-11-25 MED ORDER — RAMELTEON 8 MG PO TABS
8.0000 mg | ORAL_TABLET | Freq: Every day | ORAL | Status: DC
  Administered 2016-11-25 – 2016-11-27 (×3): 8 mg via ORAL
  Filled 2016-11-25 (×4): qty 1

## 2016-11-25 NOTE — Clinical Social Work Note (Signed)
CSW met with patient. No supports at bedside. CSW introduced role and explained that PT recommendations would be discussed. Patient stated that he does not feel he needs SNF and that he "refuses to go." Patient states that he has a walker at home and feels he gets around well. Patient stated that he would like for an aide come and assist him at home and already gets services through [facility] [employer] and just has to contact them when he discharges.  Will notify RNCM. CSW signing off. Consult again if any other social work needs arise.  Dayton Scrape, [place]

## 2016-11-25 NOTE — Progress Notes (Signed)
Subjective: No acute events overnight. Patient states that his cough is improved. His shortness of breath is also improved. Overall, he seems to be doing better.  Objective:  Vital signs in last 24 hours: Vitals:   11/24/16 2235 11/25/16 0632 11/25/16 0746 11/25/16 1141  BP: (!) 150/81 (!) 178/77  138/62  Pulse: 96 85  87  Resp: 18   16  Temp: 98.7 F (37.1 C) 98.7 F (37.1 C)  97.7 F (36.5 C)  TempSrc: Oral Oral  Oral  SpO2: 100% 100% 99% 99%  Weight:  270 lb 4.8 oz (122.6 kg)    Height:       Physical Exam  Constitutional: He is oriented to person, place, and time. He appears well-developed and well-nourished.  HENT:  Head: Normocephalic and atraumatic.  Cardiovascular: Normal rate and regular rhythm.  Exam reveals no friction rub.   No murmur heard. Respiratory: Effort normal and breath sounds normal.  Mild wheezes and crackles improved from prior examinations  GI: Soft. Bowel sounds are normal. He exhibits no distension. There is no tenderness.  Musculoskeletal: He exhibits edema.  Bilateral lower extremity edema to the level of the mid shin  Neurological: He is alert and oriented to person, place, and time.     Assessment/Plan:  Active Problems:   HTN (hypertension)   DM type 2, uncontrolled, with retinopathy (HCC)   Asthma, moderate persistent   Anemia of chronic kidney failure, stage 4 (severe) (HCC)   Acute on chronic congestive heart failure (HCC)   Constipation  1. Congestive heart failure exacerbation Patient's volume status is improving. Overall, he appears less edematous than prior examinations. He has had great urine output and is net -11 L from admission. Overall, he states that he feels much better. His scrotal edema has greatly improved. We will continue with an additional day of aggressive IV diuresis. He was able to ambulate with physical therapy this afternoon and they have recommended a home health upon discharge. -- IV Lasix 80 mg twice a  day -- I/Os and daily weight -- Renal function panel tomorrow  2. Acute on chronic renal insufficiency The patient's baseline creatinine ranges from 2.4-2.67. On admission his creatinine was elevated at 4.36. Most likely etiology for his acute on chronic renal insufficiency is prerenal secondary to cardiorenal syndrome. His most recent creatinine is 3.53 and I would suspect he will continue to have improvements in his creatinine with continued diuresis. Overall, his electrolytes are within appropriate ranges and we will continue to monitor with a renal function panel tomorrow. -- Continue IV diuresis -- Renal function panel  3. Hypertension Most recent blood pressure of 136/62. We have increased his hydralazine dosing to 100 mg 3 times a day. We have discontinued his amlodipine as this may be a contributing factor to the patient's scrotal edema. We have started the patient on verapamil in place of amlodipine. -- Hydralazine 100 mg 3 times a day -- Verapamil 240 mg once daily -- IV furosemide  4. Diabetes -- Lantus 11 units twice a day -- NovoLog 4 units 3 times a day with meals -- Sliding scale insulin  5. Productive cough The patient continues to have a cough that is productive. However, the patient states his cough is much improved from yesterday. I do not think this represents infection but may be secondary to pulmonary edema in the setting of congestive heart failure exacerbation. -- Mucinex 600 mg twice a day  DVT/PE prophylaxis: Heparin FEN/GI: Normal diet  Dispo:  Anticipated discharge in 1-2 days.   Ophelia Shoulder, MD 11/25/2016, 12:47 PM Pager: [phone]

## 2016-11-25 NOTE — Progress Notes (Signed)
Physical Therapy Treatment Patient Details Name: Jeremy Yu MRN: [id number] DOB: 1966-02-20 Today's Date: 11/25/2016    History of Present Illness Pt is a 50 y/o male admitted secondary to SOB and increased swelling. PMH including but not limited to CKD, CHF, seizures, IDDM (with retinopathy, nephropathy and neuropathy).    PT Comments    Patient seen for mobility progression. At this time, patient with significant improvements noted in activity tolerance and overall mobility. Currently ambulating increased distances with RW and stable saturations >94% throughout activity. No longer feel SNF is required. Will recommend d/c home at this time.  Follow Up Recommendations  Home health PT;Supervision - Intermittent     Equipment Recommendations  None recommended by PT    Recommendations for Other Services       Precautions / Restrictions Precautions Precautions: Fall Restrictions Weight Bearing Restrictions: No    Mobility  Bed Mobility               General bed mobility comments: received in chair  Transfers Overall transfer level: Needs assistance Equipment used: Rolling walker (2 wheeled) Transfers: Sit to/from Stand Sit to Stand: Supervision         General transfer comment: no physical assist required, VCS for hand placement with RW  Ambulation/Gait Ambulation/Gait assistance: Supervision Ambulation Distance (Feet): 210 Feet Assistive device: Rolling walker (2 wheeled) Gait Pattern/deviations: Step-through pattern;Decreased stride length;Wide base of support (increased sway) Gait velocity: decreased Gait velocity interpretation: Below normal speed for age/gender General Gait Details: patient steady with gait using RW, increased lateral sway and wide BOS noted due to scrotal swelling   Stairs            Wheelchair Mobility    Modified Rankin (Stroke Patients Only)       Balance Overall balance assessment: Needs  assistance Sitting-balance support: Feet supported Sitting balance-Leahy Scale: Good                              Cognition Arousal/Alertness: Awake/alert Behavior During Therapy: WFL for tasks assessed/performed Overall Cognitive Status: Within Functional Limits for tasks assessed                      Exercises      General Comments        Pertinent Vitals/Pain Pain Assessment: No/denies pain    Home Living                      Prior Function            PT Goals (current goals can now be found in the care plan section) Acute Rehab PT Goals Patient Stated Goal: return home PT Goal Formulation: With patient Time For Goal Achievement: 12/07/16 Potential to Achieve Goals: Fair Progress towards PT goals: Progressing toward goals    Frequency    Min 3X/week      PT Plan Discharge plan needs to be updated    Co-evaluation             End of Session Equipment Utilized During Treatment: Gait belt Activity Tolerance: Patient tolerated treatment well Patient left: in chair;with call bell/phone within reach;with chair alarm set     Time: 9021-1155 PT Time Calculation (min) (ACUTE ONLY): 21 min  Charges:  $Gait Training: 8-22 mins                    G Codes:  Duncan Dull 11/25/2016, 11:49 AM Alben Deeds, PT DPT  [phone]

## 2016-11-26 DIAGNOSIS — I509 Heart failure, unspecified: Secondary | ICD-10-CM

## 2016-11-26 LAB — BASIC METABOLIC PANEL
ANION GAP: 7 (ref 5–15)
BUN: 28 mg/dL — ABNORMAL HIGH (ref 6–20)
CALCIUM: 8.7 mg/dL — AB (ref 8.9–10.3)
CO2: 28 mmol/L (ref 22–32)
CREATININE: 3.38 mg/dL — AB (ref 0.61–1.24)
Chloride: 107 mmol/L (ref 101–111)
GFR, EST AFRICAN AMERICAN: 23 mL/min — AB (ref >60)
GFR, EST NON AFRICAN AMERICAN: 20 mL/min — AB (ref >60)
GLUCOSE: 119 mg/dL — AB (ref 65–99)
Potassium: 3.8 mmol/L (ref 3.5–5.1)
Sodium: 142 mmol/L (ref 135–145)

## 2016-11-26 LAB — GLUCOSE, CAPILLARY
GLUCOSE-CAPILLARY: 143 mg/dL — AB (ref 65–99)
Glucose-Capillary: 113 mg/dL — ABNORMAL HIGH (ref 65–99)
Glucose-Capillary: 107 mg/dL — ABNORMAL HIGH (ref 65–99)
Glucose-Capillary: 81 mg/dL (ref 65–99)

## 2016-11-26 MED ORDER — FUROSEMIDE 10 MG/ML IJ SOLN: 80.0000 mg | Freq: Two times a day (BID) | INTRAMUSCULAR | Status: DC

## 2016-11-26 MED ORDER — FUROSEMIDE 10 MG/ML IJ SOLN
80.0000 mg | Freq: Two times a day (BID) | INTRAMUSCULAR | Status: AC
  Administered 2016-11-26: 80 mg via INTRAVENOUS
  Filled 2016-11-26: qty 8

## 2016-11-26 MED ORDER — FUROSEMIDE 10 MG/ML IJ SOLN
80.0000 mg | Freq: Once | INTRAMUSCULAR | Status: AC
  Administered 2016-11-26: 80 mg via INTRAVENOUS
  Filled 2016-11-26: qty 8

## 2016-11-26 NOTE — Progress Notes (Signed)
Patient up and down during the night, unable to get comfortable in the bed, did sit in the chair and sleep for several hours.  Noted to be coughing frequently during the night in spite of Mucinex and Tessalon Perles however patient states his cough is much improved from when he was admitted.

## 2016-11-26 NOTE — Progress Notes (Signed)
Pt. Is drowsy, was reported restlessness, unable to sleep, up in the chair and back to bed at 3am, coughing continues, upon assessment lungs are diminished as well as a 2 lb weight gain. Iv lasix continues and watching kidney function.

## 2016-11-26 NOTE — Progress Notes (Signed)
   Subjective: No acute events overnight. Patient states that he is feeling better. His breathing has improved. He still has scrotal soreness but thinks this is also improved. His cough is still present but improved from prior.  Objective:  Vital signs in last 24 hours: Vitals:   11/25/16 1141 11/25/16 1934 11/25/16 2108 11/26/16 0442  BP: 138/62 (!) 163/87  128/76  Pulse: 87 87 93 79  Resp: $Remo'16 18 18 18  'qGuIQ$ Temp: 97.7 F (36.5 C) 98 F (36.7 C)  97.6 F (36.4 C)  TempSrc: Oral Oral  Oral  SpO2: 99% 100% 98% 100%  Weight:    272 lb 14.4 oz (123.8 kg)  Height:       Physical Exam  Constitutional: He is oriented to person, place, and time. He appears well-developed and well-nourished.  HENT:  Head: Normocephalic and atraumatic.  Cardiovascular: Normal rate and regular rhythm.  Exam reveals no gallop and no friction rub.   No murmur heard. Respiratory: Effort normal and breath sounds normal. No respiratory distress. He has no wheezes.  GI: Soft. Bowel sounds are normal. He exhibits no distension. There is no tenderness.  Genitourinary:  Genitourinary Comments: Scrotal edema. The same from prior examination.  Musculoskeletal: He exhibits edema.  Neurological: He is alert and oriented to person, place, and time.     Assessment/Plan:  Active Problems:   HTN (hypertension)   DM type 2, uncontrolled, with retinopathy (HCC)   Asthma, moderate persistent   Anemia of chronic kidney failure, stage 4 (severe) (HCC)   Acute on chronic congestive heart failure (HCC)   Constipation  1. Congestive heart failure exacerbation Patient's volume status improving. He is negative approximately 12 L since admission. I think he would benefit from an additional day of IV diuresis. We will increase his diuresis from 80 mg IV furosemide twice a day to 3 times a day. If he continues to diurese appropriately we plan for discharge tomorrow. -- I/Os and daily weight -- Renal function panel tomorrow -- IV  furosemide 80 Milligrams 3 times a day -- Physical therapy recommends the patient may return home with home health  2. Acute on chronic renal insufficiency The patient's baseline creatinine ranges from 2.4-2.67. On admission his creatinine was elevated at 4.36. Most likely etiology for his acute on chronic renal insufficiency is prerenal secondary to cardiorenal syndrome. His most recent creatinine is 3.38. I suspect his creatinine will continue to improve with further diuresis. -- Continue IV diuresis -- Renal function panel  3. Hypertension Most recent blood pressure of 128/76. Continue with the current medication regimen. -- Hydralazine 100 mg 3times a day -- Verapamil 240 mg once daily -- IV furosemide  4. Diabetes -- Lantus 11 units twice a day -- NovoLog 4 units 3 times a day with meals -- Sliding scale insulin  5. Productive cough The patient continues to have a productive cough although this improved overnight. I do not think this cough is caused by infection as the patient has been afebrile without other signs of infection. This likely etiology for the patient's cough is secondary to his heart failure exacerbation. We will continue to treat symptomatically with Mucinex.Marland Kitchen -- Mucinex 600 mg twice a day  DVT/PE prophylaxis: Heparin FEN/GI: Normal diet  Dispo: Anticipated discharge tomorrow.   Ophelia Shoulder, MD 11/26/2016, 6:40 AM Pager: [phone]

## 2016-11-26 NOTE — Progress Notes (Signed)
Blood sugar 81. MD notified. Lantus not given per MD order. Will continue to monitor.  Izaha Shughart, RN

## 2016-11-27 LAB — BASIC METABOLIC PANEL
Anion gap: 5 (ref 5–15)
BUN: 29 mg/dL — ABNORMAL HIGH (ref 6–20)
CALCIUM: 8.3 mg/dL — AB (ref 8.9–10.3)
CO2: 29 mmol/L (ref 22–32)
CREATININE: 3.36 mg/dL — AB (ref 0.61–1.24)
Chloride: 108 mmol/L (ref 101–111)
GFR calc non Af Amer: 20 mL/min — ABNORMAL LOW (ref >60)
GFR, EST AFRICAN AMERICAN: 23 mL/min — AB (ref >60)
Glucose, Bld: 101 mg/dL — ABNORMAL HIGH (ref 65–99)
Potassium: 3.7 mmol/L (ref 3.5–5.1)
Sodium: 142 mmol/L (ref 135–145)

## 2016-11-27 LAB — GLUCOSE, CAPILLARY
GLUCOSE-CAPILLARY: 103 mg/dL — AB (ref 65–99)
GLUCOSE-CAPILLARY: 124 mg/dL — AB (ref 65–99)
Glucose-Capillary: 99 mg/dL (ref 65–99)
Glucose-Capillary: 119 mg/dL — ABNORMAL HIGH (ref 65–99)

## 2016-11-27 LAB — VITAMIN D 25 HYDROXY (VIT D DEFICIENCY, FRACTURES): VIT D 25 HYDROXY: 4.9 ng/mL — AB (ref 30.0–100.0)

## 2016-11-27 MED ORDER — FUROSEMIDE 10 MG/ML IJ SOLN: 80.0000 mg | Freq: Two times a day (BID) | INTRAMUSCULAR | Status: DC

## 2016-11-27 MED ORDER — CALCIUM CARBONATE-VITAMIN D 500-200 MG-UNIT PO TABS
1.0000 | ORAL_TABLET | Freq: Every day | ORAL | Status: DC
  Administered 2016-11-27 – 2016-11-28 (×2): 1 via ORAL
  Filled 2016-11-27 (×2): qty 1

## 2016-11-27 MED ORDER — FUROSEMIDE 10 MG/ML IJ SOLN
80.0000 mg | Freq: Four times a day (QID) | INTRAMUSCULAR | Status: AC
  Administered 2016-11-27 (×2): 80 mg via INTRAVENOUS
  Filled 2016-11-27 (×2): qty 8

## 2016-11-27 MED ORDER — VERAPAMIL HCL ER 180 MG PO TBCR
360.0000 mg | EXTENDED_RELEASE_TABLET | Freq: Every day | ORAL | Status: DC
  Administered 2016-11-28: 360 mg via ORAL
  Filled 2016-11-27: qty 2

## 2016-11-27 MED ORDER — HYDRALAZINE HCL 50 MG PO TABS
50.0000 mg | ORAL_TABLET | Freq: Three times a day (TID) | ORAL | Status: DC
  Administered 2016-11-27 – 2016-11-28 (×3): 50 mg via ORAL
  Filled 2016-11-27 (×3): qty 1

## 2016-11-27 NOTE — Progress Notes (Signed)
Subjective: Patient became borderline hypotensive yesterday evening and his hydralazine dose was reduced. There are no other acute events overnight. Today I saw him ambulate up and down the entire hall. He is much improved. He states that his breathing and cough have improved. He is feeling very well and thinks he will be ready to go home tomorrow.  Objective:  Vital signs in last 24 hours: Vitals:   11/26/16 2130 11/27/16 0454 11/27/16 1043 11/27/16 1054  BP: (!) 154/68 (!) 160/86  (!) 164/82  Pulse: 81 85  91  Resp: 16 18    Temp: 98.2 F (36.8 C) 98.4 F (36.9 C)    TempSrc: Oral Oral    SpO2: 100% 97% 99% 99%  Weight:  272 lb 14.4 oz (123.8 kg)    Height:       Physical Exam  Constitutional: He is oriented to person, place, and time. He appears well-developed and well-nourished.  Obese  HENT:  Head: Normocephalic and atraumatic.  Cardiovascular: Normal rate and regular rhythm.  Exam reveals no gallop and no friction rub.   No murmur heard. Respiratory: Effort normal and breath sounds normal.  GI: Soft. Bowel sounds are normal. He exhibits no distension. There is no tenderness.  Genitourinary:  Genitourinary Comments: Patient continues to have scrotal edema. This has improved with diuresis.  Musculoskeletal: He exhibits edema.  Patient continues to have 1+ bilateral lower extremity edema that has improved from prior examinations  Neurological: He is alert and oriented to person, place, and time.     Assessment/Plan:  Active Problems:   HTN (hypertension)   DM type 2, uncontrolled, with retinopathy (HCC)   Asthma, moderate persistent   Anemia of chronic kidney failure, stage 4 (severe) (HCC)   Acute on chronic congestive heart failure (HCC)   Constipation  1. Congestive heart failure exacerbation The patient continues to diurese appropriately. He is net -13.5 L since admission. His breathing is improved and he appears less edematous on examination. We will continue  today with IV diuresis and switch him to oral medications tomorrow for discharge. -- Renal function panel tomorrow -- IV furosemide 80 Milligrams times a day -- Physical therapy recommends the patient may return home with home health  2. Acute on chronic renal insufficiency The patient's baseline creatinine ranges from 2.4-2.67. On admission his creatinine was elevated at 4.36. Most likely etiology for his acute on chronic renal insufficiency is prerenal secondary to cardiorenal syndrome. His most recent creatinine is 3.36. I suspect his creatinine will continue to improve with further diuresis. -- Continue IV diuresis -- Renal function panel  3. Hypertension Most recent blood pressure of 164/82. The patient had one episode of borderline hypotension yesterday evening. Overall, his blood pressure has been better controlled. We will make minor adjustments to his antihypertensive medication regimen today in preparation for discharge tomorrow. Notably, we will increase his verapamil to 360 mg once daily. He will continue with IV diuresis today and will be switched tomorrow morning to 80 mg once daily Lasix by mouth. -- Hydralazine $RemoveBefore'100mg'DnxQXofTRoFde$  3times a day -- Verapamil 360 mg once daily -- IV furosemide  4. Diabetes -- Lantus 11 units twice a day -- NovoLog 4 units 3 times a day with meals -- Sliding scale insulin  5. Productive cough The patient's productive cough is improving. He is coughing less frequently and is making less sputum production. We will continue with supportive management with Mucinex. -- Mucinex 600 mg twice a day  DVT/PE prophylaxis: Heparin  FEN/GI: Normal diet  Dispo: Anticipated discharge tomorrow.  Ophelia Shoulder, MD 11/27/2016, 12:16 PM Pager: [phone]

## 2016-11-27 NOTE — Progress Notes (Signed)
Physical Therapy Treatment Patient Details Name: Jeremy Yu MRN: [id number] DOB: 08/13/1966 Today's Date: 2016-12-09    History of Present Illness Pt is a 50 y/o male admitted secondary to SOB and increased swelling. PMH including but not limited to CKD, CHF, seizures, IDDM (with retinopathy, nephropathy and neuropathy).    PT Comments    Patient seen for mobility progression. Tolerated increased activity today on room air. Spoke with patient regarding continued energy conservation technique. Ambulated on room air with VSS and saturations >93% throughout activity.  Follow Up Recommendations  Home health PT;Supervision - Intermittent     Equipment Recommendations  None recommended by PT    Recommendations for Other Services       Precautions / Restrictions Precautions Precautions: Fall Restrictions Weight Bearing Restrictions: No    Mobility  Bed Mobility               General bed mobility comments: received in chair  Transfers Overall transfer level: Needs assistance Equipment used: Rolling walker (2 wheeled) Transfers: Sit to/from Stand Sit to Stand: Supervision         General transfer comment: VCs for hand placement  Ambulation/Gait Ambulation/Gait assistance: Supervision Ambulation Distance (Feet): 410 Feet Assistive device: Rolling walker (2 wheeled) Gait Pattern/deviations: Step-through pattern;Decreased stride length;Wide base of support (ambulated on room air) Gait velocity: decreased Gait velocity interpretation: Below normal speed for age/gender General Gait Details: patient steady with gait using RW, increased lateral sway and wide BOS noted due to scrotal swelling   Stairs            Wheelchair Mobility    Modified Rankin (Stroke Patients Only)       Balance Overall balance assessment: Needs assistance Sitting-balance support: Feet supported Sitting balance-Leahy Scale: Good       Standing balance-Leahy Scale:  Fair Standing balance comment: bilateral UE support                    Cognition Arousal/Alertness: Awake/alert Behavior During Therapy: WFL for tasks assessed/performed Overall Cognitive Status: Within Functional Limits for tasks assessed                      Exercises      General Comments        Pertinent Vitals/Pain Pain Assessment: No/denies pain    Home Living                      Prior Function            PT Goals (current goals can now be found in the care plan section) Acute Rehab PT Goals Patient Stated Goal: return home PT Goal Formulation: With patient Time For Goal Achievement: 12/07/16 Potential to Achieve Goals: Good Progress towards PT goals: Progressing toward goals    Frequency    Min 3X/week      PT Plan Discharge plan needs to be updated    Co-evaluation             End of Session Equipment Utilized During Treatment: Gait belt Activity Tolerance: Patient tolerated treatment well Patient left: in chair;with call bell/phone within reach;with chair alarm set     Time: 1601-0932 PT Time Calculation (min) (ACUTE ONLY): 17 min  Charges:  $Gait Training: 8-22 mins                    G Codes:      Duncan Dull 09-Dec-2016, 1:27 PM [employer]  Barb Merino, [place] DPT  [phone]

## 2016-11-27 NOTE — Progress Notes (Signed)
Patient was complaining of coughing during the night, PRN given and effective, with no complaints or concerns After that.  Order for urethral catheter continued.   Tamsen Reist, RN

## 2016-11-27 NOTE — Care Management Note (Signed)
Case Management Note  Patient Details  Name: Jeremy Yu MRN: [id number] Date of Birth: Aug 09, 1966  Subjective/Objective:       Admitted with CHF           Action/Plan: Patient lives at home with his pastor,PCP is Dr Lovena Le; has private insurance with Medicaid with prescription drug coverage; pharmacy of choice is [facility]; pt reports no problem getting his medication. Patient is legally blind, he receives assistance from [employer]. DME - walker and cane at home; He is agreeable to [facility] services, [place] choice offered, pt chose [place]; Hoyle Sauer with [facility] called for arrangements. He has scales at home but does not weigh himself daily; CM informed pt to weigh himself daily with his pastors assistance to monitor his weight gain.  Attending MD at discharge please enter the face to face for [facility] services.  Expected Discharge Date:    possibly 11/27/2016              Expected Discharge Plan:  [place]  Discharge planning Services  CM Consult  Choice offered to:  Patient   HH Arranged:  RN, Disease Management, Nurse's Aide HH Agency:  [facility]  Status of Service:  In process, will continue to follow  Sherrilyn Rist [phone] 11/27/2016, 9:45 AM

## 2016-11-28 ENCOUNTER — Telehealth: Payer: Self-pay | Admitting: Internal Medicine

## 2016-11-28 LAB — GLUCOSE, CAPILLARY
GLUCOSE-CAPILLARY: 112 mg/dL — AB (ref 65–99)
GLUCOSE-CAPILLARY: 113 mg/dL — AB (ref 65–99)
GLUCOSE-CAPILLARY: 103 mg/dL — AB (ref 65–99)

## 2016-11-28 MED ORDER — VERAPAMIL HCL ER 180 MG PO TBCR: 360.0000 mg | EXTENDED_RELEASE_TABLET | Freq: Every day | ORAL | 3 refills | Status: DC

## 2016-11-28 MED ORDER — FUROSEMIDE 80 MG PO TABS: 80.0000 mg | ORAL_TABLET | Freq: Every day | ORAL | 3 refills | Status: DC

## 2016-11-28 MED ORDER — CALCIUM CARBONATE-VITAMIN D 500-200 MG-UNIT PO TABS: 1.0000 | ORAL_TABLET | Freq: Every day | ORAL | 1 refills | Status: AC

## 2016-11-28 NOTE — Progress Notes (Signed)
Patient stood and attempted to void following discontinuation of foley catheter with no success. Patient does endorse having the urge and pressure, but not discomfort. Dr. Lovena Le has been paged.

## 2016-11-28 NOTE — Telephone Encounter (Signed)
Will forward to Olga Coaster RN- Case Manager. Message is not for Dr. Cristopher Peru, but for Dr. Ophelia Shoulder. Patient is currently admitted in the hospital.

## 2016-11-28 NOTE — Telephone Encounter (Signed)
Tanya([facility]) is calling to verify that Dr.Taylor will the physician signing off on home care for nursing and home health aide . Please call l

## 2016-11-28 NOTE — Progress Notes (Signed)
Coude catheter removed per MD order. Pt to have urine trial. Will continue to monitor.

## 2016-11-28 NOTE — Telephone Encounter (Signed)
Called, spoke with Lavella Lemons with [facility] requesting verbal auth for home care nursing and home aid. Informed pt was last seen by Dr. Johnsie Cancel on 05/15/16. Pt is due for his f/u office visit, but it has not been scheduled. Informed Lavella Lemons to please fax request to [phone]. Informed I will route to Dr. Johnsie Cancel and nurse.

## 2016-11-28 NOTE — Progress Notes (Signed)
RN attempted to insert urethral catheter with no success. Dr. Lovena Le has been made aware and will contact urology for insertion.

## 2016-11-28 NOTE — Progress Notes (Signed)
Patient with no complaints or concerns during 7pm - 7am shift.  Rivaan Kendall, RN 

## 2016-11-28 NOTE — Progress Notes (Signed)
   Subjective: No acute events overnight. Pt improved today. Ready to go home.   Objective:  Vital signs in last 24 hours: Vitals:   11/27/16 1657 11/27/16 1941 11/27/16 2110 11/28/16 0537  BP: (!) 154/77 (!) 159/79  (!) 157/80  Pulse: 82 95  (!) 107  Resp: (!) $RemoveB'22 20  18  'MggIjPdQ$ Temp: 97.9 F (36.6 C) 98.2 F (36.8 C)  98.4 F (36.9 C)  TempSrc: Oral Oral  Oral  SpO2: 100% 97% 98% 96%  Weight:    267 lb 6.4 oz (121.3 kg)  Height:       Physical Exam  Constitutional: He appears well-developed and well-nourished.  HENT:  Head: Normocephalic and atraumatic.  Cardiovascular: Normal rate and regular rhythm.  Exam reveals no gallop and no friction rub.   No murmur heard. Respiratory: Effort normal and breath sounds normal. No respiratory distress. He has no wheezes.  GI: Soft. Bowel sounds are normal. He exhibits no distension. There is no tenderness.  Genitourinary:  Genitourinary Comments: Scrotal edema imporved  Musculoskeletal: He exhibits edema.  improved     Assessment/Plan:  Active Problems:   HTN (hypertension)   DM type 2, uncontrolled, with retinopathy (HCC)   Asthma, moderate persistent   Anemia of chronic kidney failure, stage 4 (severe) (HCC)   Acute on chronic congestive heart failure (HCC)   Constipation  1. Congestive heart failure exacerbation The patient continues to diurese appropriately. He is net -16 L since admission. His breathing has improved and he appears less edematous on examination.  -- Discontinue IV diuresis today -- Discontinue Foley -- Start oral furosemide 80 mg once daily -- Prepare for discharge  2. Acute on chronic renal insufficiency The patient's baseline creatinine ranges from 2.4-2.67. On admission his creatinine was elevated at 4.36. Most likely etiology for his acute on chronic renal insufficiency is prerenal secondary to cardiorenal syndrome. His most recent creatinine is 3.36. -- Start oral furosemide 80 mg once daily  3.  Hypertension -- Hydralazine $RemoveBefore'50mg'[id number]$  3times a day -- Verapamil 360 mg once daily  4. Diabetes -- Lantus 11 units twice a day -- NovoLog 4 units 3 times a day with meals -- Sliding scale insulin  5. Productive cough. -- Mucinex 600 mg twice a day  DVT/PE prophylaxis: Heparin FEN/GI: Normal diet  Dispo: Anticipated discharge today.  Ophelia Shoulder, MD 11/28/2016, 7:18 AM Pager: [phone]

## 2016-11-28 NOTE — Progress Notes (Signed)
Orders received for pt discharge.  Discharge summary printed and reviewed with pt.  Explained medication regimen, and pt had no further questions at this time.  IV removed and site remains clean, dry, intact.  Telemetry removed.  Pt in stable condition and awaiting transport. Cab voucher given to pt for transportation. Foley catheter 18 Fr, remains in place at discharge.

## 2016-11-28 NOTE — Final Consult Note (Signed)
Procedure: Using sterile technique the genital region was prepped with Betadine.  I then passed the 52 French flexible cystoscope through the opening where the penis was located and I was able to identify the glands and passed the scope into the ureteral meatus and down the urethra.  There was no evidence of stricture or lesion.  I passed a 0.038 inch floppy-tip guidewire through the cystoscope and into the bladder and left this in place and removed the cystoscope.  A 16 French council tip catheter was then passed over the guidewire into the bladder without difficulty and the guidewire was removed.  The balloon was then inflated with 10 cc of sterile water and the catheter was hooked to closed system drainage with clear urine draining from the bladder.  The patient tolerated the procedure well.  Assessment: Difficult Foley catheter placement secondary to marked scrotal edema, morbid obesity and buried penis - I replaced his Foley catheter today cystoscopically without difficulty.  He will need to remain indwelling upon discharge and the patient will follow up with Dr. Alyson Ingles as an outpatient.  Plan:  1.  Discharge patient with Foley catheter indwelling. 2.  Follow-up with Dr. Alyson Ingles as an outpatient.  (Follow-up information placed on chart)    Subjective: I was reconsulted today because the patient had his Foley catheter removed for a voiding trial this morning.  He has been unable to urinate all day long.  Attempts at placing a Foley catheter using a 14 French catheter was attempted without the ability to visualize the glans and meatus and of course was unsuccessful.  Dr. Alyson Ingles had said in his note "The foley should remain in place until his inpatient diuresis is complete and the scrotal edema has improved".  The patient was getting ready for discharge and despite the fact that he still had marked scrotal edema the Foley catheter was removed for a voiding trial.  Objective: Vital signs in last  24 hours: Temp:  [98.2 F (36.8 C)-99 F (37.2 C)] 99 F (37.2 C) (12/07 1127) Pulse Rate:  [88-107] 88 (12/07 1127) Resp:  [18-20] 18 (12/07 1127) BP: (149-159)/(79-86) 149/86 (12/07 1127) SpO2:  [96 %-98 %] 97 % (12/07 1127) Weight:  [267 lb 6.4 oz (121.3 kg)] 267 lb 6.4 oz (121.3 kg) (12/07 0537)A  Intake/Output from previous day: 12/06 0701 - 12/07 0700 In: 480 [P.O.:480] Out: 3001 [Urine:3000; Stool:1] Intake/Output this shift: Total I/O In: 1180 [P.O.:1180] Out: 1 [Stool:1]  Past Medical History:  Diagnosis Date  . Anxiety   . Asthma   . Blind in both eyes   . Chronic bronchitis ([place])   . Chronic renal failure    Sees [facility]/notes 05/14/2016  . Daily headache   . Depression   . Diabetic retinopathy ([place])    Archie Endo 11/02/2015  . DVT (deep venous thrombosis) ([place])    "? side" (10/03/2016)  . Falls frequently    "last fall was yesterday" (10/03/2016)  . Fibromyalgia   . GERD (gastroesophageal reflux disease)   . History of blood transfusion   . History of stomach ulcers   . Hyperlipidemia   . Hypertension   . Migraine    "weekly" (10/03/2016)  . Nephropathy    Archie Endo 11/02/2015  . Peripheral neuropathy ([place])    Archie Endo 11/02/2015  . Seizures ([place])    "light; none in the last 2 months; probably have 3/year" (10/03/2016)  . Symptomatic anemia 10/03/2016  . Type I diabetes mellitus ([place]) dx'd ~ 1987   Archie Endo  11/02/2015    Physical Exam:  Lungs - Normal respiratory effort, chest expands symmetrically.  Abdomen - Soft, non-tender & non-distended obese GU - Scrotum with marked edema.  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  Lab Results: No results for input(s): WBC, HGB, HCT in the last 72 hours. BMET  Recent Labs  11/26/16 0443 11/27/16 0656  NA 142 142  K 3.8 3.7  CL 107 108  CO2 28 29  GLUCOSE 119* 101*  BUN 28* 29*  CREATININE 3.38* 3.36*  CALCIUM 8.7*  8.3*   No results for input(s): LABURIN in the last 72 hours. No results found for this or any previous visit.  Studies/Results: No results found.    Claybon Jabs 11/28/2016, 6:11 PM

## 2016-11-29 NOTE — Discharge Summary (Signed)
Name: Jeremy Yu MRN: [id number] DOB: [birth year] 50 y.o. PCP: Jule Ser, DO  Date of Admission: 11/21/2016 12:21 AM Date of Discharge: 11/29/2016 Attending Physician: No att. providers found  Discharge Diagnosis: 1. Congestive heart failure exacerbation 2. Acute on chronic renal insufficiency 3. Failed spontaneous voiding trial 4. Hypertension Active Problems:   HTN (hypertension)   DM type 2, uncontrolled, with retinopathy (HCC)   Asthma, moderate persistent   Anemia of chronic kidney failure, stage 4 (severe) (HCC)   Acute on chronic congestive heart failure (HCC)   Constipation   Discharge Medications:   Medication List    STOP taking these medications   amLODipine 10 MG tablet Commonly known as:  NORVASC     TAKE these medications   albuterol 108 (90 Base) MCG/ACT inhaler Commonly known as:  PROVENTIL HFA;VENTOLIN HFA Inhale 1-2 puffs into the lungs every 6 (six) hours as needed for wheezing or shortness of breath.   aspirin EC 81 MG tablet Take 1 tablet (81 mg total) by mouth daily.   atorvastatin 40 MG tablet Commonly known as:  LIPITOR TAKE ONE TABLET BY MOUTH ONCE DAILY   beclomethasone 80 MCG/ACT inhaler Commonly known as:  QVAR Inhale 2 puffs into the lungs 2 (two) times daily.   calcium-vitamin D 500-200 MG-UNIT tablet Commonly known as:  OSCAL WITH D Take 1 tablet by mouth daily.   furosemide 80 MG tablet Commonly known as:  LASIX Take 1 tablet (80 mg total) by mouth daily. What changed:  See the new instructions.   gabapentin 400 MG capsule Commonly known as:  NEURONTIN TAKE ONE CAPSULE BY MOUTH THREE TIMES DAILY   hydrALAZINE 50 MG tablet Commonly known as:  APRESOLINE Take 1 tablet (50 mg total) by mouth 3 (three) times daily.   insulin aspart 100 UNIT/ML FlexPen Commonly known as:  NOVOLOG FLEXPEN Inject 10 Units into the skin 3 (three) times daily with meals.   Insulin Glargine 100 UNIT/ML Solostar Pen Commonly known  as:  LANTUS SOLOSTAR Inject 22 Units into the skin 2 (two) times daily at 8 am and 10 pm.   nitroGLYCERIN 0.4 MG SL tablet Commonly known as:  NITROSTAT Place 1 tablet (0.4 mg total) under the tongue every 5 (five) minutes as needed for chest pain.   verapamil 180 MG CR tablet Commonly known as:  CALAN-SR Take 2 tablets (360 mg total) by mouth daily.       Disposition and follow-up:   [name] was discharged from [facility] in Good condition.  At the hospital follow up visit please address:  1.  Please ensure the patient is taking his new dose of Lasix.  2.  Labs / imaging needed at time of follow-up: none  3.  Pending labs/ test needing follow-up: none  Follow-up Appointments: Follow-up Information    [facility] Follow up.   Specialty:  [place] Why:  They will do your home health care at your home Contact information: [place] [address] [phone]        Nicolette Bang, MD.   Specialty:  Urology Why:  For an appointment in 2-3 weeks when you get home. Contact information: [place] [address] [phone]           Hospital Course by problem list: Active Problems:   HTN (hypertension)   DM type 2, uncontrolled, with retinopathy (HCC)   Asthma, moderate persistent   Anemia of chronic kidney failure,  stage 4 (severe) (HCC)   Acute on chronic congestive heart failure (HCC)   Constipation   1. Congestive heart failure exacerbation The patient presented to the [facility] emergency department on 11/21/2016 with orthopnea and dyspnea on exertion. Additionally, at time of presentation the patient was noted to have bilateral lower extremity edema and massive scrotal edema. At time of presentation he was found to have a congestive heart failure exacerbation was admitted to the hospital for IV diuresis. Once admitted an IV was placed and the patient was diuresed with IV furosemide.  Additionally, a Foley was attempted to be placed but this was difficult urology was consulted. A Foley was placed under cystoscopy. Once diuresis was initiated the patient diuresed appropriately. During the patient since patient hospitalization was net -16 L. On the day of discharge the patient said his respiratory status greatly improved. Additionally, the patient scrotal edema greatly improved during his hospitalization. On the day of discharge his breathing was improved and his cough had also improved. He was discharged on a new dose of Lasix. He will take 80 mg of furosemide once daily.  2. Acute on chronic kidney disease stage III The patient had a history of chronic kidney disease stage III and presented with an acute on chronic kidney injury. The patient's creatinine improved during his hospitalization with IV diuresis. The most likely etiology of the patient's acute on chronic kidney disease was cardiorenal with decreased perfusion in the setting of congestive heart failure exacerbation. On the day of discharge the patient's creatinine was 3.36. He'll be continued on oral furosemide 80 mg once daily in the outpatient setting.   3. Failed spontaneous voiding trial A catheter was placed for aggressive IV diuresis. On the day of discharge the catheter was removed and the patient failed spontaneous voiding trial. Urology was consulted and replaced the catheter. He has outpatient follow-up for removal of the catheter.  4. Hypertension  Patient has a history of hypertension. While in the inpatient setting we discontinued the use of his amlodipine because of scrotal edema and lower extremity edema. We started verapamil 360 mg once daily. Additionally, the patient will be on hydralazine 50 mg 3 times a day. While in the inpatient setting it was noted that the patient was having heavy proteinuria. He will most likely benefit from an ACE inhibitor or angiotensin receptor blocker once his creatinine recovers  as this may decrease his proteinuria and help prevent future heart failure exacerbations. Discharge Vitals:   BP (!) 149/86 (BP Location: Left Arm)   Pulse 88   Temp 99 F (37.2 C) (Oral)   Resp 18   Ht $R'6\' 1"'Zr$  (1.854 m)   Wt 267 lb 6.4 oz (121.3 kg) Comment: wasn't able to wake pt, nurse notified  SpO2 97%   BMI 35.28 kg/m   Pertinent Labs, Studies, and Procedures:  1. Renal ultrasound-no hydronephrosis 2. CT head without contrast- no acute intracranial pathology 3. Catheter placement under cystoscopy  Discharge Instructions: Discharge Instructions    Diet - low sodium heart healthy    Complete by:  As directed    Discharge instructions    Complete by:  As directed    We have changed several of your medications.  Most notably, we have changed your Lasix to 80 mg. Please take 1 pill 1 time daily. Additionally, we have discontinued the use of your amlodipine. In place of amlodipine we have started verapamil. Please take verapamil once daily. Please continue to take your other medications as  prescribed.  You have a follow-up with urology. I scheduled you an appointment for 12/05/2016 at 8 AM. This is with [facility]. Please ensure you make this clinic appointment to have her catheter removed.  The [facility] internal medicine teaching clinic will contact you to schedule an outpatient hospital follow-up visit next week. Please ensure you go to this visit.   Increase activity slowly    Complete by:  As directed       Signed: Ophelia Shoulder, MD 11/29/2016, 2:20 PM   Pager: [phone]

## 2016-12-02 ENCOUNTER — Telehealth: Payer: Self-pay | Admitting: Cardiovascular Disease

## 2016-12-02 NOTE — Telephone Encounter (Signed)
Left message for [name] to call back.

## 2016-12-02 NOTE — Telephone Encounter (Signed)
New message  Nurse is calling in regards to skilled nursing frequency  Please call back and advise

## 2016-12-03 ENCOUNTER — Telehealth: Payer: Self-pay

## 2016-12-03 NOTE — Telephone Encounter (Signed)
Left message to call back  

## 2016-12-03 NOTE — Telephone Encounter (Signed)
[name] from [facility] requesting VO. Please call back.

## 2016-12-03 NOTE — Telephone Encounter (Signed)
Follow up  Jeremy Yu voiced from [facility] no one called her yesterday and wanting to speak to someone regarding what's going.  Please f/u with Jeremy Yu

## 2016-12-04 NOTE — Telephone Encounter (Signed)
Returned [employer] call to let her know the correct doctor to follow-up with for home health.

## 2016-12-05 NOTE — Telephone Encounter (Signed)
[facility] would like VO orders for continued services, gave VO, are you good with this?

## 2016-12-06 NOTE — Telephone Encounter (Signed)
Yes, thank you.

## 2016-12-30 ENCOUNTER — Other Ambulatory Visit: Payer: Self-pay | Admitting: *Deleted

## 2017-01-10 ENCOUNTER — Telehealth: Payer: Self-pay | Admitting: Dietician

## 2017-01-10 NOTE — Telephone Encounter (Signed)
Note patient was in need a a new meter to check his blood sugar at this last visit. Called to follow up to see if he had gotten one.Left a message

## 2017-02-10 ENCOUNTER — Encounter: Payer: Self-pay | Admitting: *Deleted

## 2017-02-26 ENCOUNTER — Other Ambulatory Visit: Payer: Self-pay

## 2017-02-26 DIAGNOSIS — E11319 Type 2 diabetes mellitus with unspecified diabetic retinopathy without macular edema: Secondary | ICD-10-CM

## 2017-02-26 DIAGNOSIS — Z794 Long term (current) use of insulin: Principal | ICD-10-CM

## 2017-02-26 MED ORDER — FUROSEMIDE 80 MG PO TABS: 80.0000 mg | ORAL_TABLET | Freq: Every day | ORAL | 1 refills | Status: AC

## 2017-02-26 MED ORDER — ATORVASTATIN CALCIUM 40 MG PO TABS: 40.0000 mg | ORAL_TABLET | Freq: Every day | ORAL | 1 refills | Status: AC

## 2017-02-26 MED ORDER — VERAPAMIL HCL ER 180 MG PO TBCR: 360.0000 mg | EXTENDED_RELEASE_TABLET | Freq: Every day | ORAL | 1 refills | Status: AC

## 2017-02-26 MED ORDER — HYDRALAZINE HCL 50 MG PO TABS: 50.0000 mg | ORAL_TABLET | Freq: Three times a day (TID) | ORAL | 1 refills | Status: AC

## 2017-02-26 NOTE — Telephone Encounter (Signed)
Requesting bp med to be filled. Please call pt back.

## 2017-03-05 ENCOUNTER — Other Ambulatory Visit: Payer: Self-pay | Admitting: Internal Medicine

## 2017-03-05 DIAGNOSIS — E11319 Type 2 diabetes mellitus with unspecified diabetic retinopathy without macular edema: Secondary | ICD-10-CM

## 2017-03-05 DIAGNOSIS — Z794 Long term (current) use of insulin: Principal | ICD-10-CM

## 2017-03-05 MED ORDER — GABAPENTIN 300 MG PO CAPS: 600.0000 mg | ORAL_CAPSULE | Freq: Every day | ORAL | 2 refills | Status: AC

## 2017-03-05 NOTE — Telephone Encounter (Signed)
Based on patients declining renal function, I have adjusted his dose to be $Rem'600mg'bHsR$  daily (recommended for CrCl >15 to 29 mL/minute is 200 to 700 mg once daily)

## 2017-03-05 NOTE — Telephone Encounter (Signed)
gabapentin (NEURONTIN) 400 MG capsule [facility] [place] [facility]

## 2017-03-27 ENCOUNTER — Encounter: Payer: Medicaid Other | Admitting: Internal Medicine

## 2017-04-03 ENCOUNTER — Encounter: Payer: Medicaid Other | Admitting: Internal Medicine

## 2017-05-08 ENCOUNTER — Encounter: Payer: Medicaid Other | Admitting: Internal Medicine

## 2017-05-29 ENCOUNTER — Encounter: Payer: Medicaid Other | Admitting: Internal Medicine

## 2017-10-20 IMAGING — DX DG CHEST 2V
2 series · 2 of 2 positions shown · non-contrast
Comparison: 10/03/2016

CLINICAL DATA: Chest pain

EXAM:
CHEST  2 VIEW

[chest pa]
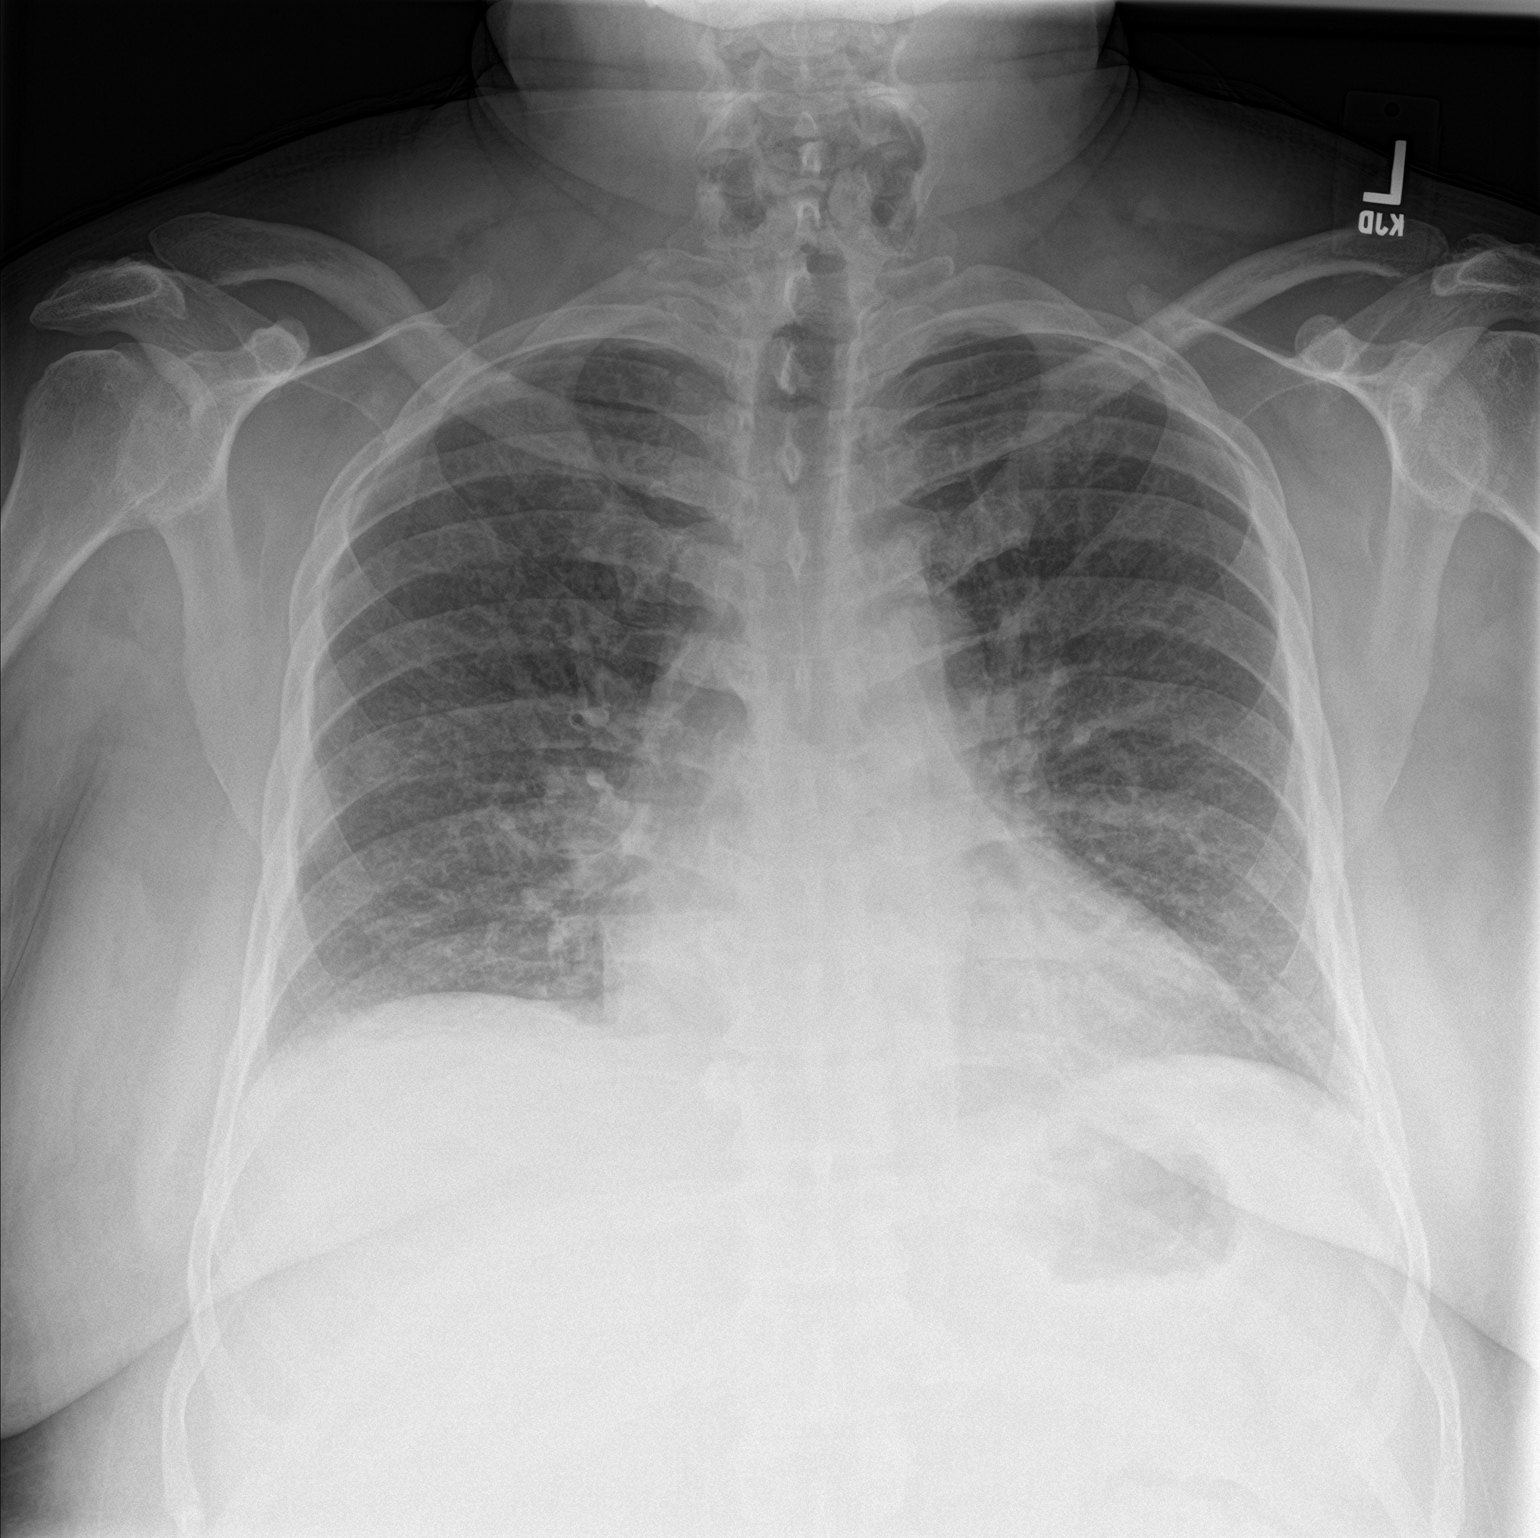

[chest lat]
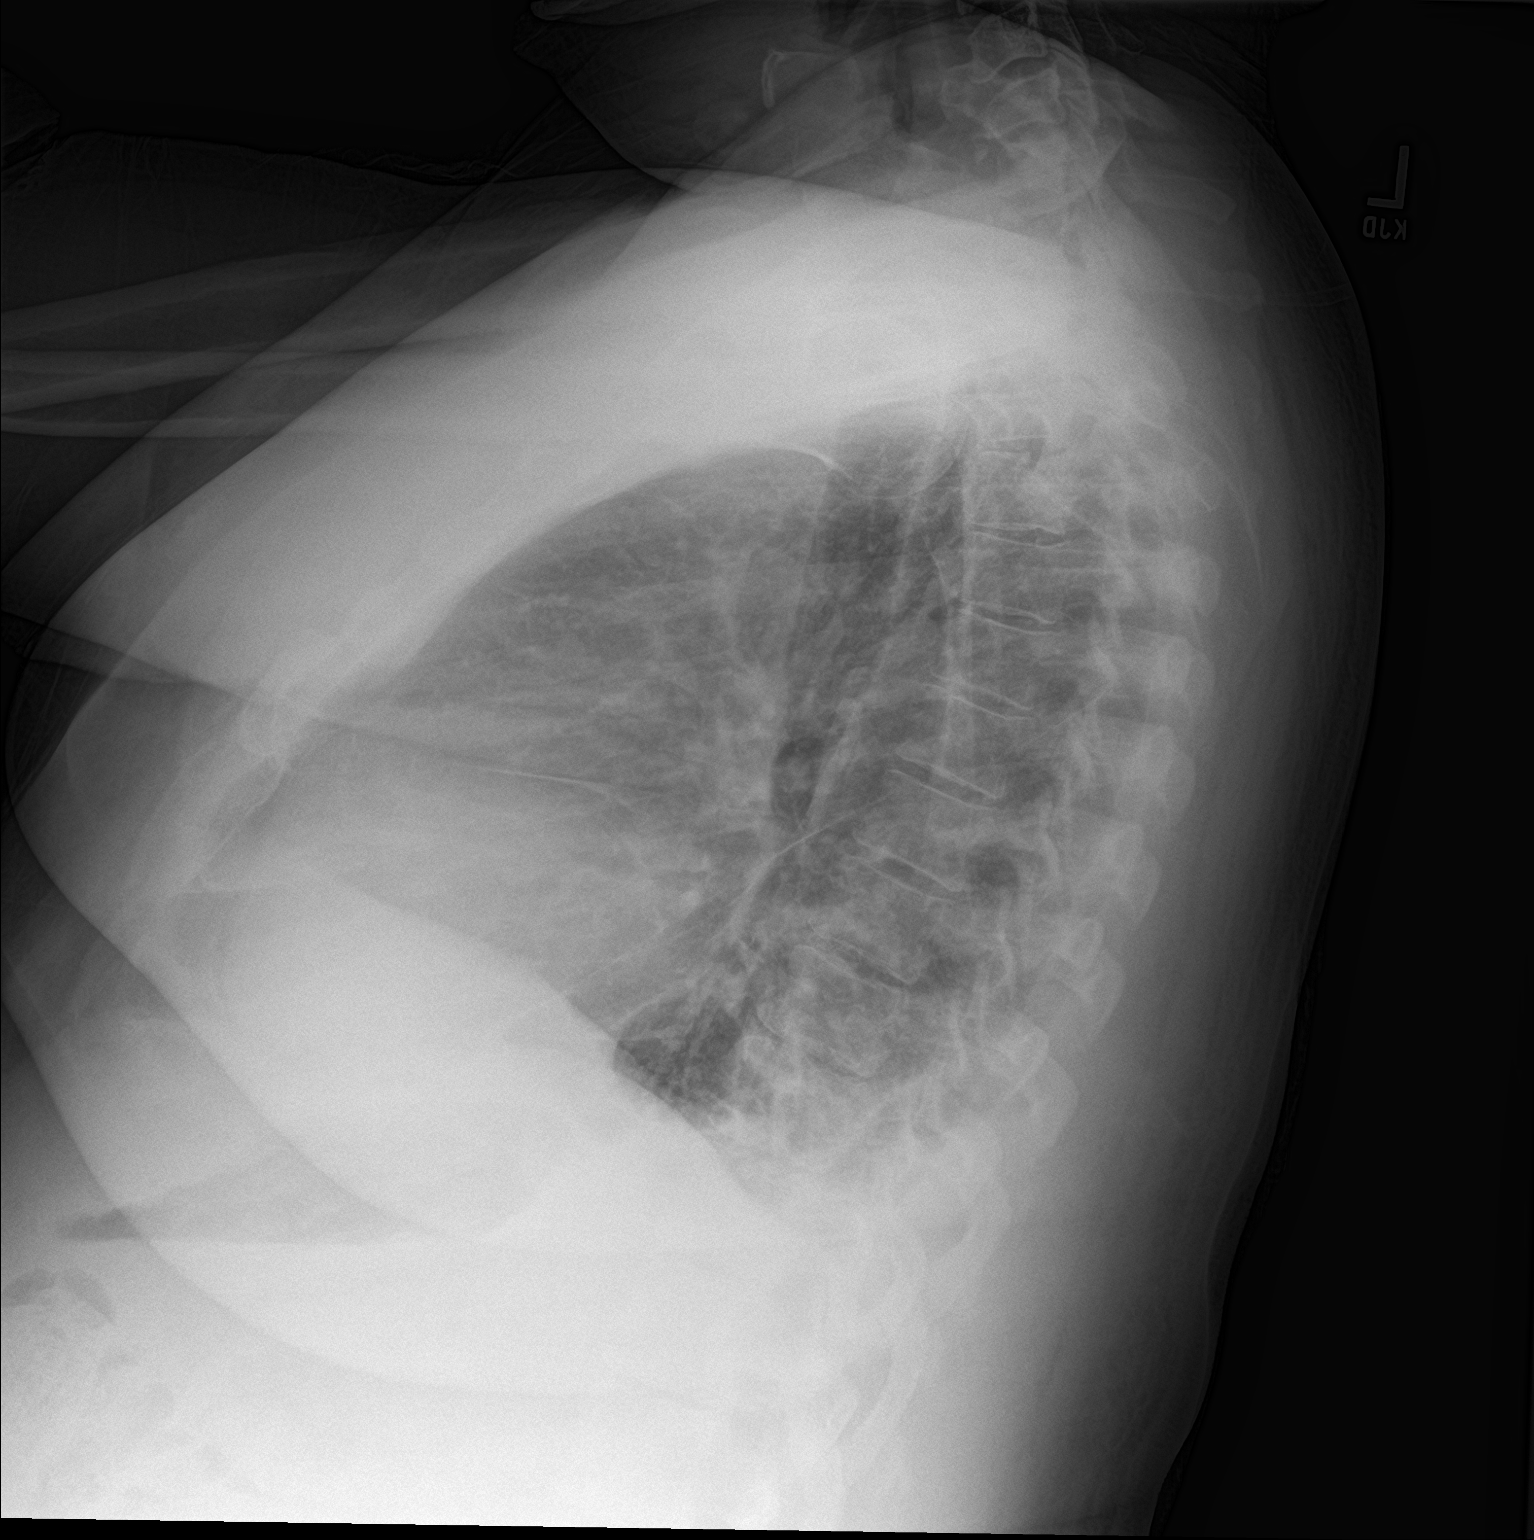

[2 of 2 positions shown; findings below may reference images not displayed]

FINDINGS: There are trace bilateral effusions best seen on the lateral view.
There is mild cardiomegaly with mild central congestion and mild
diffuse interstitial opacities suggesting edema. No consolidation.
No pneumothorax.
IMPRESSION: Mild cardiomegaly with mild central vascular congestion and mild
interstitial edema. Suggestion of trace bilateral effusions.

## 2017-10-21 IMAGING — CT CT HEAD W/O CM
3 of 4 series · 16 of 47 positions shown, 19 images · non-contrast
Comparison: None.

CLINICAL DATA: Acute onset of recurrent syncope. Initial encounter.

EXAM:
CT HEAD WITHOUT CONTRAST
TECHNIQUE: Contiguous axial images were obtained from the base of the skull
through the vertex without intravenous contrast.

[Series 201: head w/o, idose (1) · axial · non-contrast · 0.49mm/px · z∈[+284,+419]mm · 10 of 33 slices shown, 13 images]
[im 3/33  brain]
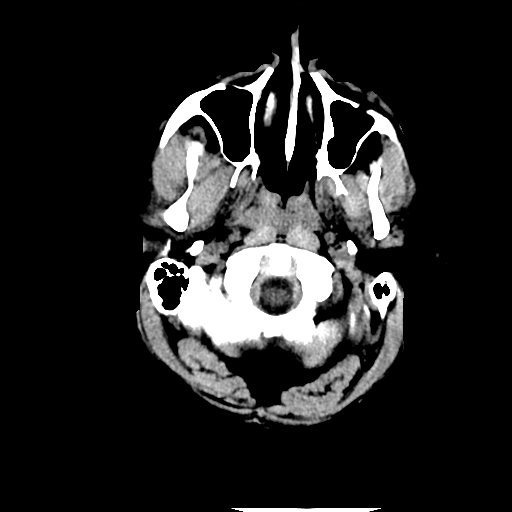
[im 3/33  bone]
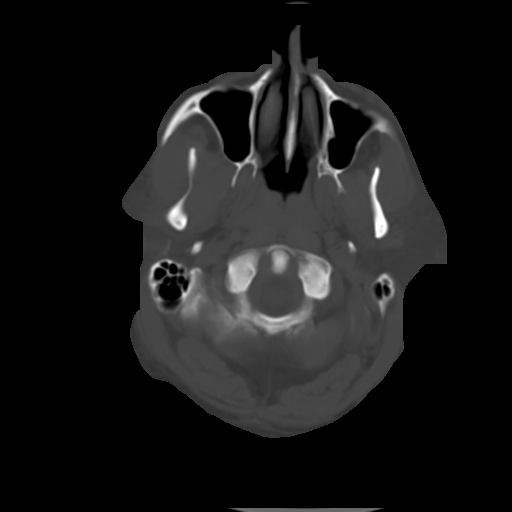
[im 5/33  brain]
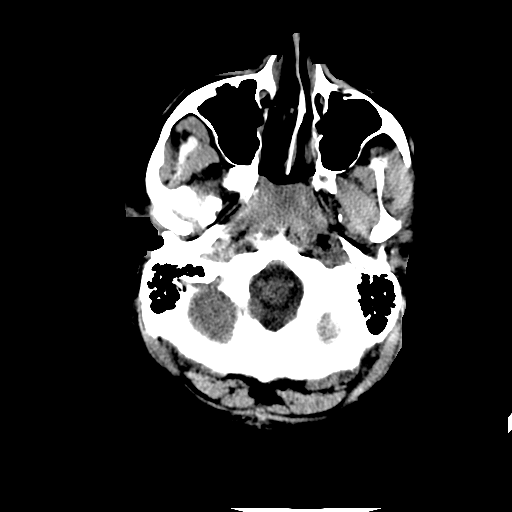
[im 10/33  brain]
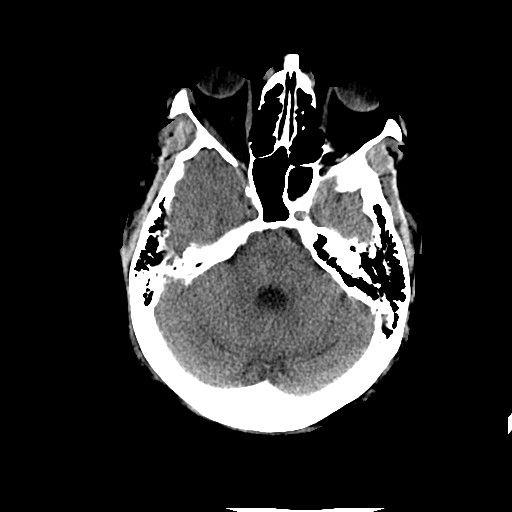
[im 12/33  brain]
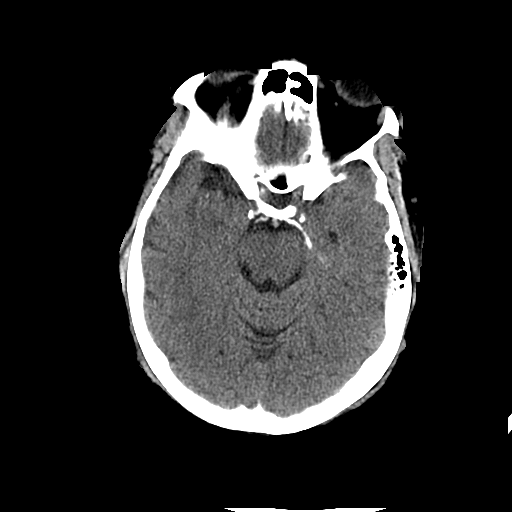
[im 14/33  brain]
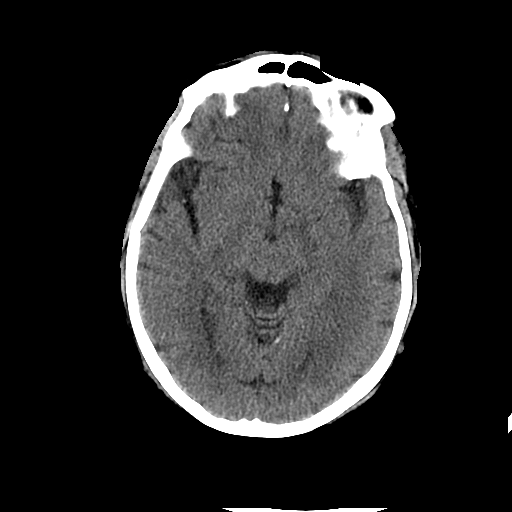
[im 14/33  bone]
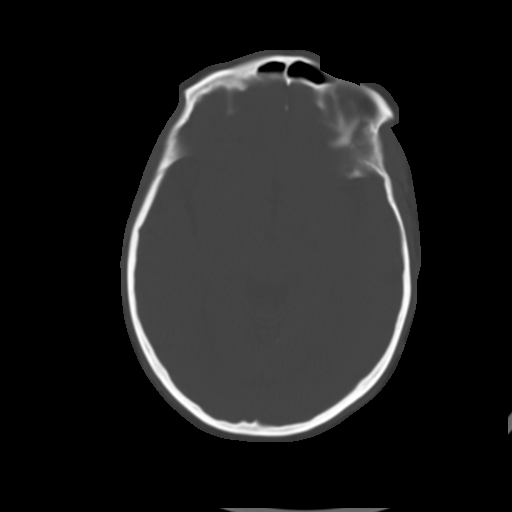
[im 19/33  brain]
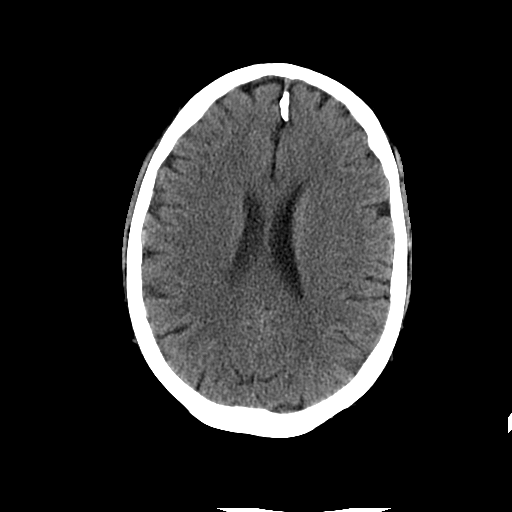
[im 21/33  brain]
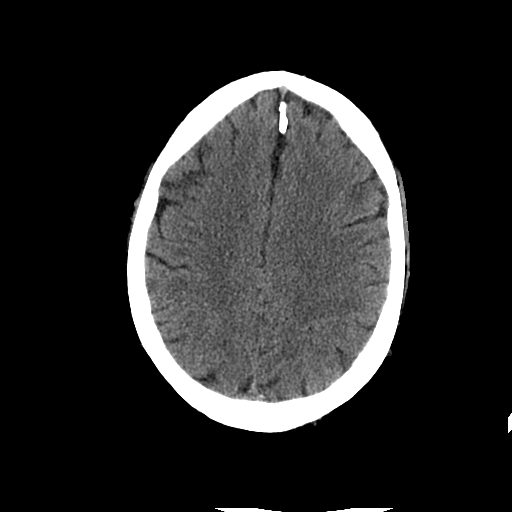
[im 23/33  brain]
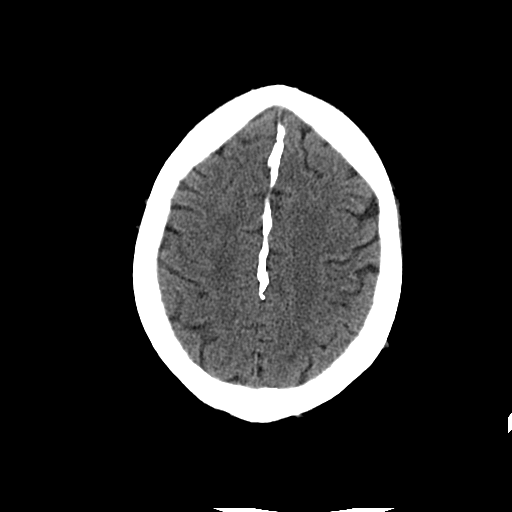
[im 28/33  brain]
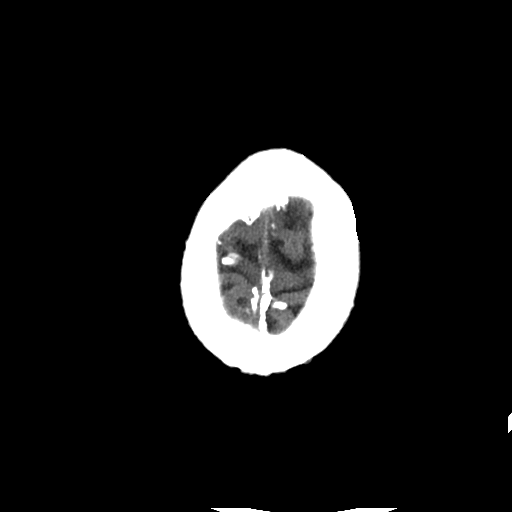
[im 28/33  bone]
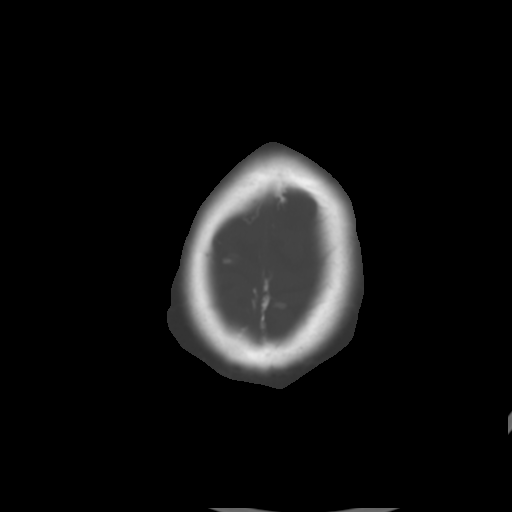
[im 30/33  brain]
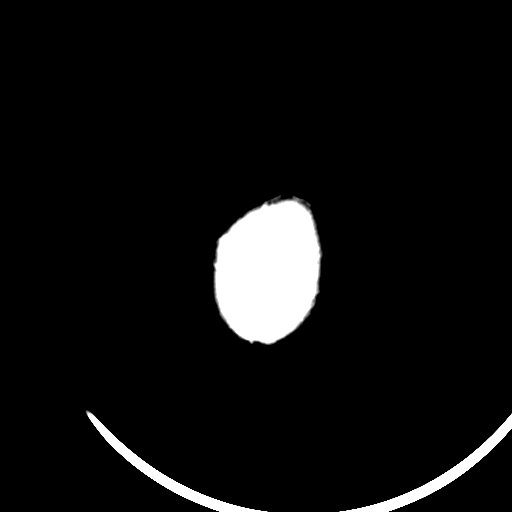

[Series 203: coronal st, idose (1) · coronal · 0.40mm/px · 3 of 83 slices shown]
[im 28/83  brain]
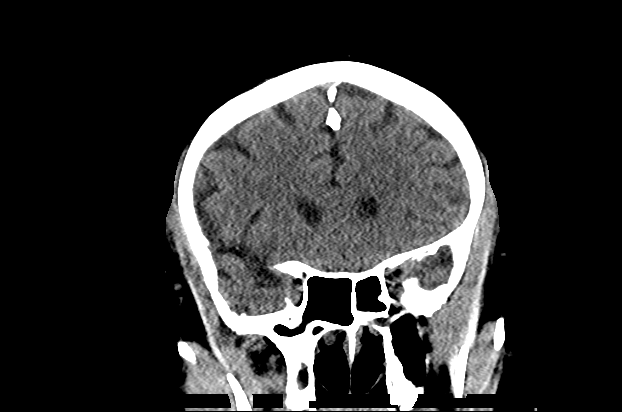
[im 37/83  brain]
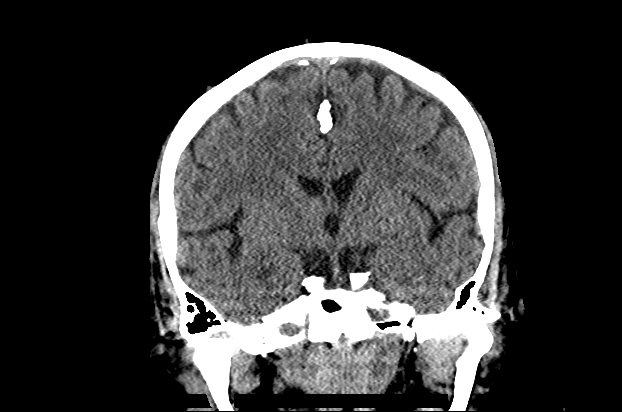
[im 46/83  brain]
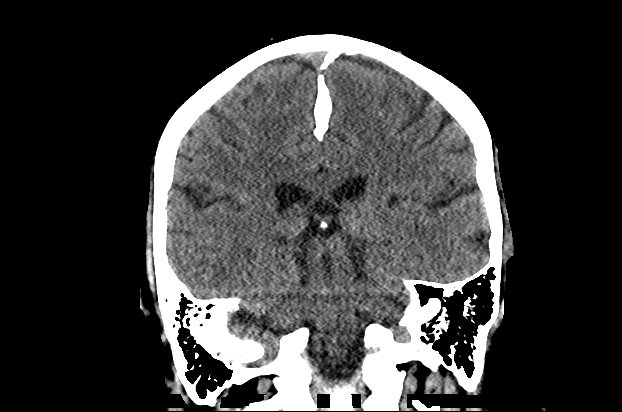

[Series 204: sagittal st, idose (1) · sagittal · 0.40mm/px · 3 of 76 slices shown]
[im 26/76  brain]
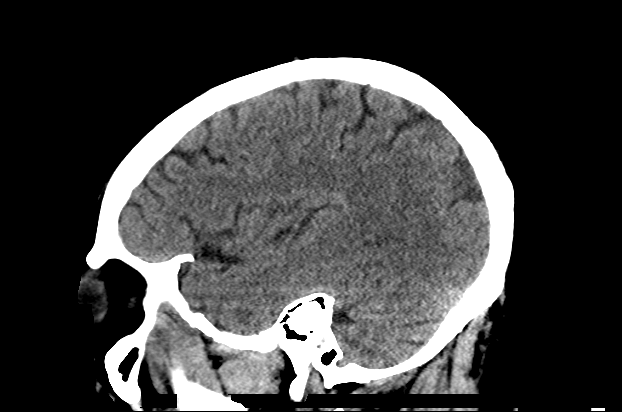
[im 38/76  brain]
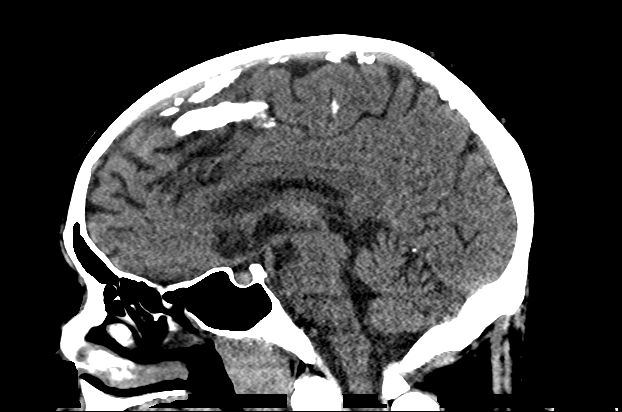
[im 51/76  brain]
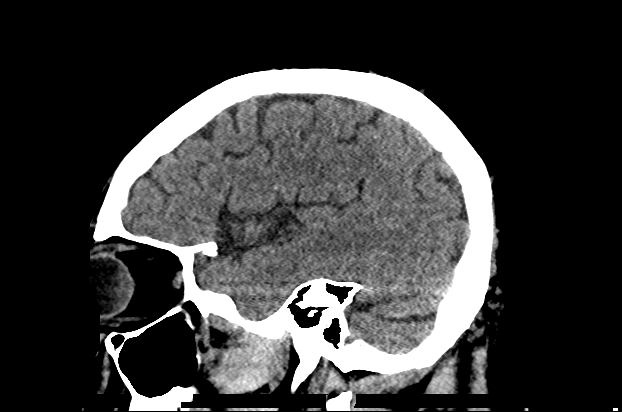

[16 of 47 positions shown; findings below may reference images not displayed]

FINDINGS: Brain: No evidence of acute infarction, hemorrhage, hydrocephalus,
extra-axial collection or mass lesion/mass effect.

Mild periventricular and subcortical white matter change likely
reflects small vessel ischemic microangiopathy.

The posterior fossa, including the cerebellum, brainstem and fourth
ventricle, is within normal limits. The third and lateral
ventricles, and basal ganglia are unremarkable in appearance. The
cerebral hemispheres are symmetric in appearance, with normal
gray-white differentiation. No mass effect or midline shift is seen.

Vascular: No hyperdense vessel or unexpected calcification.

Skull: There is no evidence of fracture; visualized osseous
structures are unremarkable in appearance.

Sinuses/Orbits: Vaguely increased attenuation is noted dependently
within the left optic globe. The orbits are otherwise unremarkable.
The paranasal sinuses and mastoid air cells are well-aerated.

Other: No significant soft tissue abnormalities are seen.
IMPRESSION: 1. No acute intracranial pathology seen on CT.
2. Mild small vessel ischemic microangiopathy.
3. Vaguely increased attenuation noted dependently within the left
optic globe.

## 2017-11-28 IMAGING — US US RENAL
1 series · 14 of 25 positions shown · non-contrast
Comparison: None.

CLINICAL DATA: Acute on chronic kidney failure

EXAM:
RENAL / URINARY TRACT ULTRASOUND COMPLETE

[Series 1: us renal · 0.30mm/px · 14 of 38 slices shown]
[im 1/38]
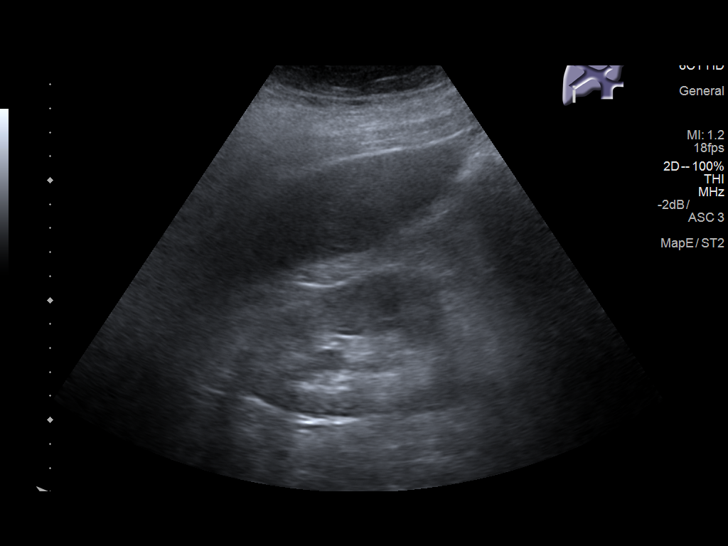
[im 4/38]
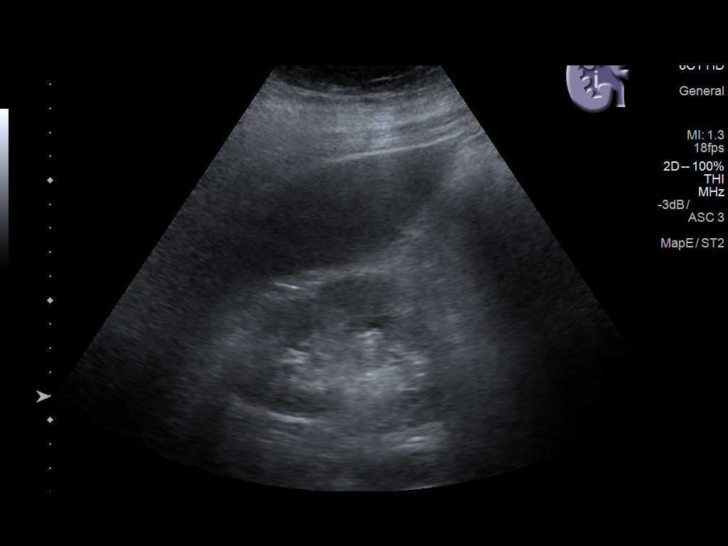
[im 7/38]
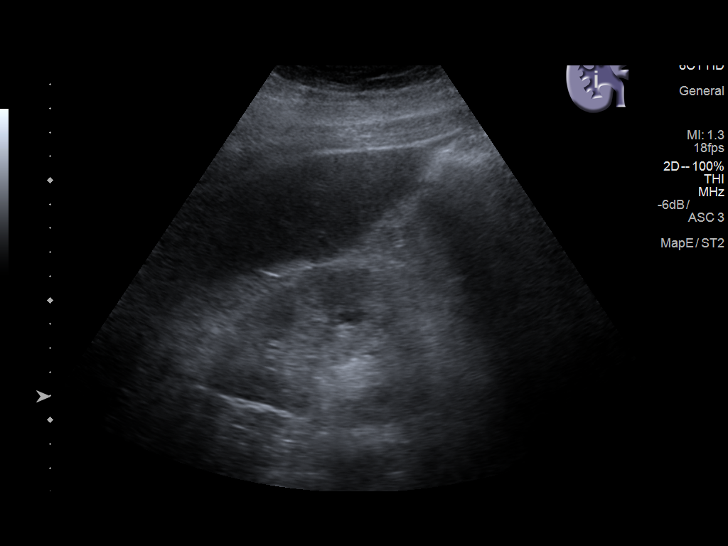
[im 10/38]
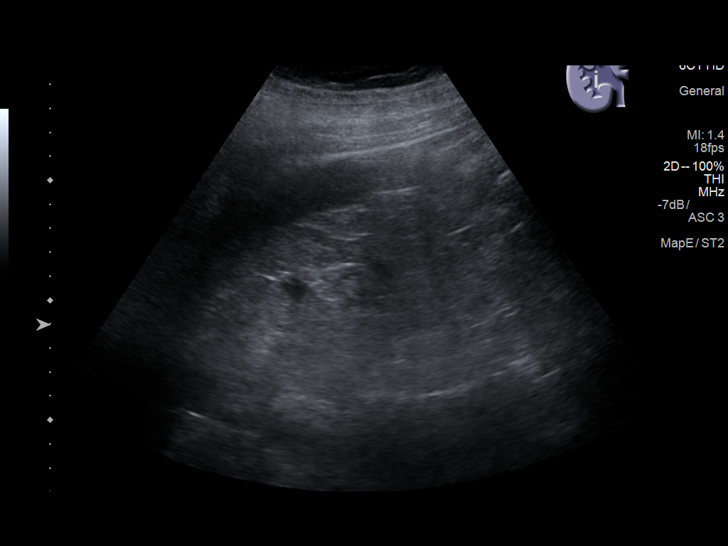
[im 13/38]
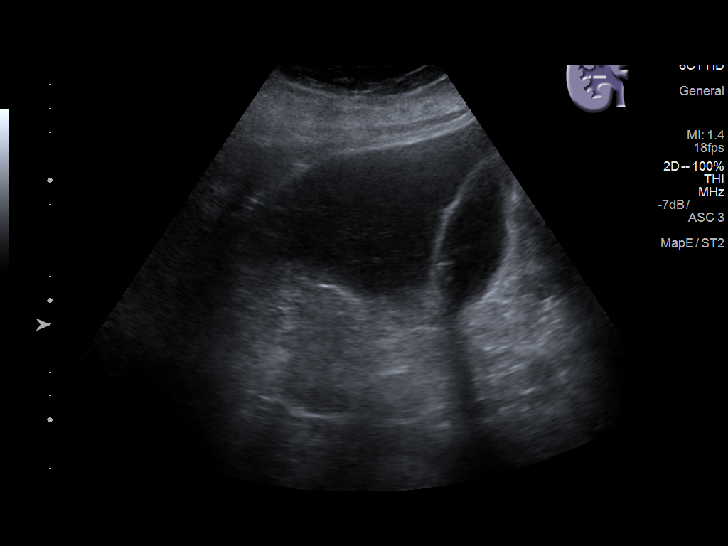
[im 14/38]
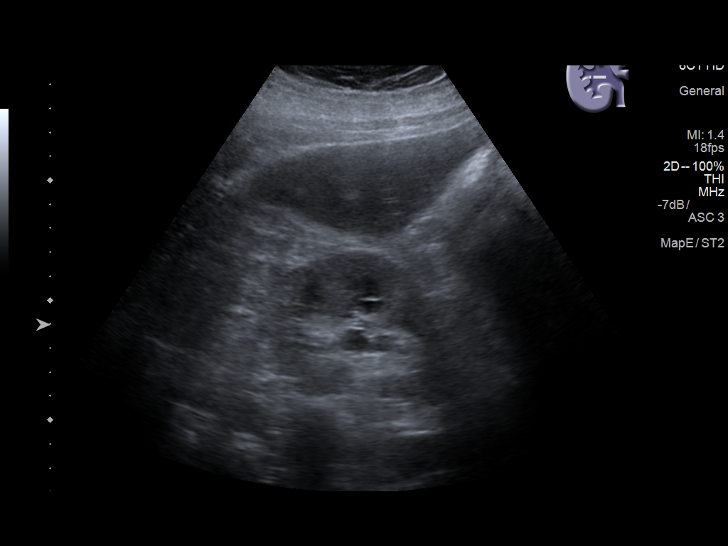
[im 17/38]
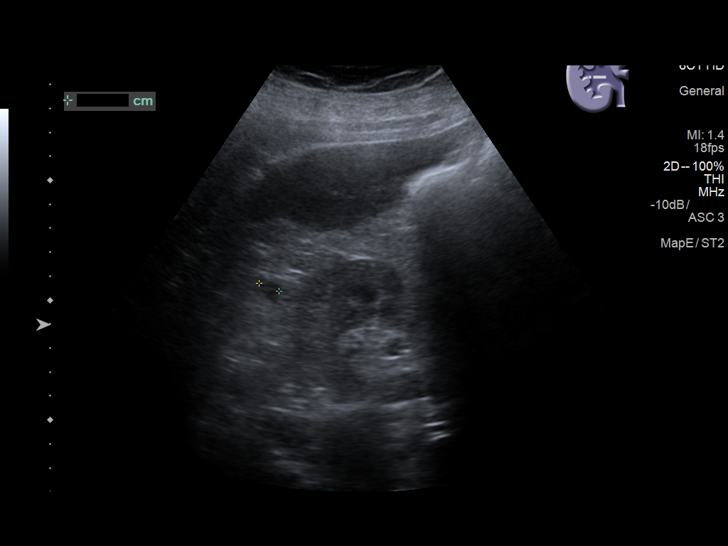
[im 21/38]
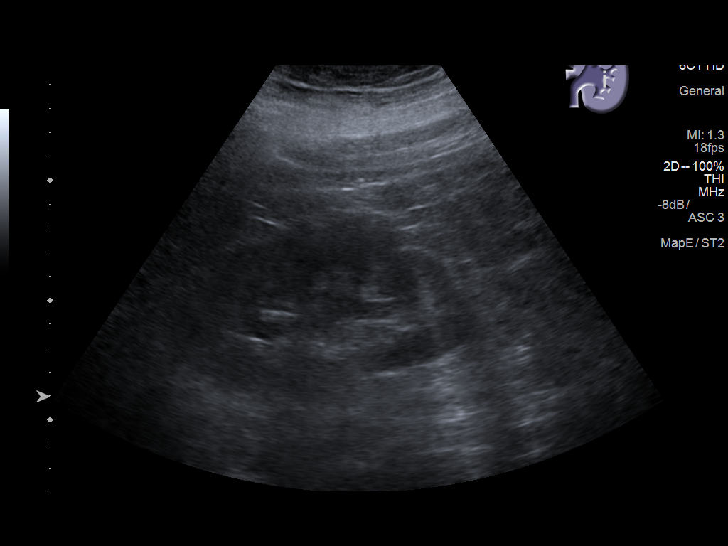
[im 24/38]
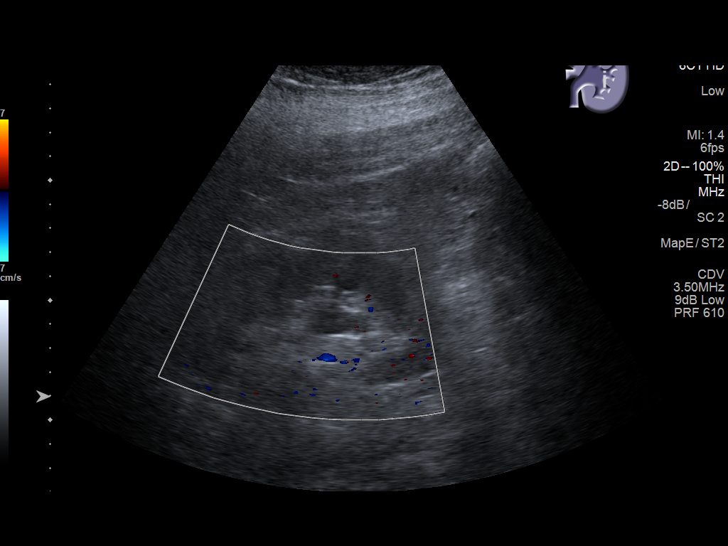
[im 25/38]
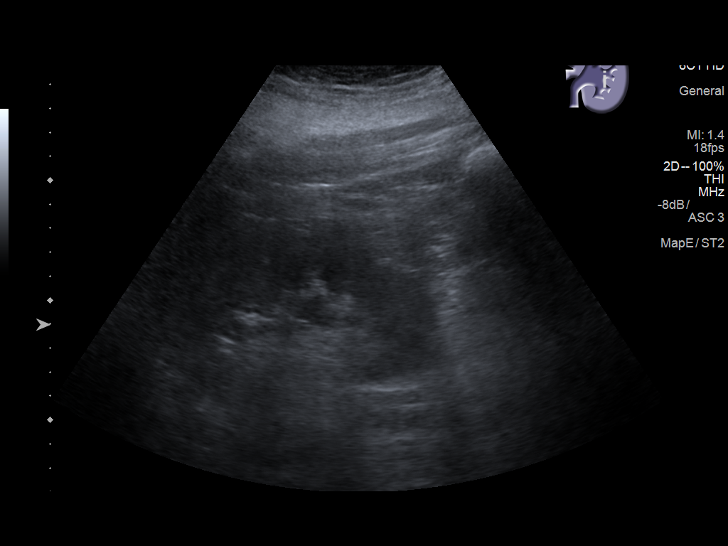
[im 28/38]
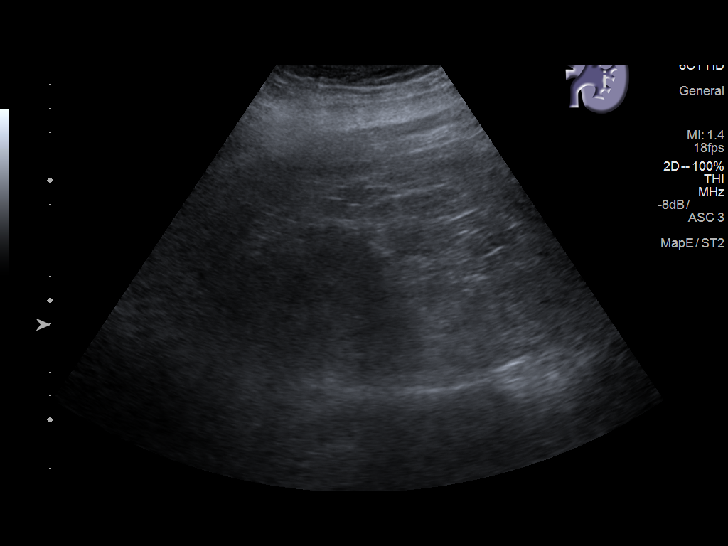
[im 31/38]
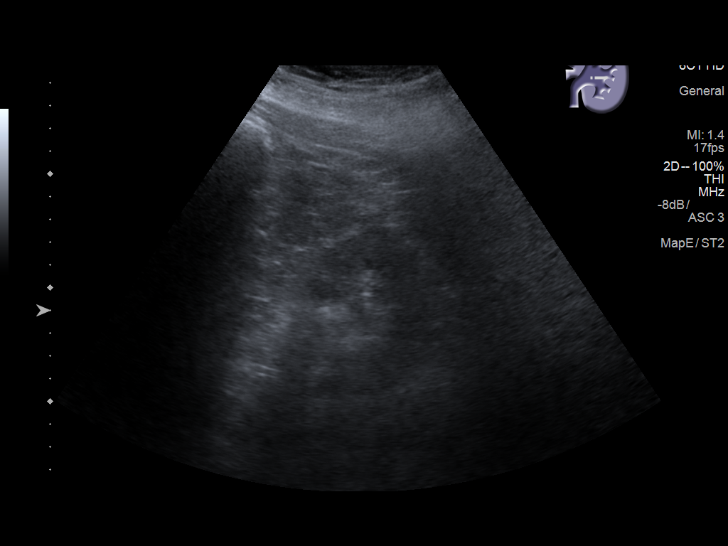
[im 34/38]
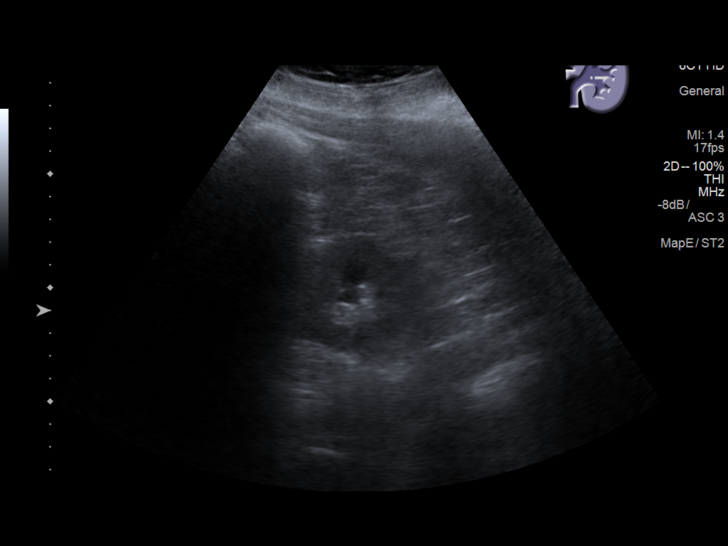
[im 38/38]
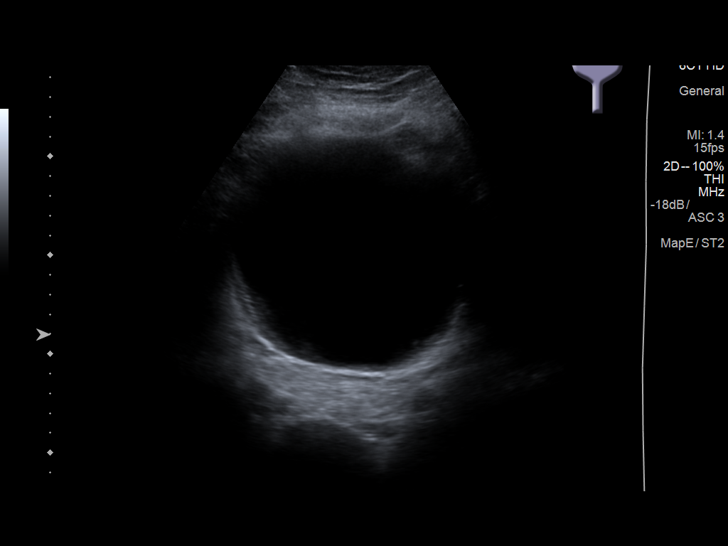

[14 of 25 positions shown; findings below may reference images not displayed]

FINDINGS: Right Kidney:

Length: 10.9 cm.. No hydronephrosis is seen. A small cyst is noted
in the mid pole of 1.2 cm. The echogenicity of the renal parenchyma
does appear to be somewhat increased, suggesting chronic renal
medical disease.

Left Kidney:

Length: 12.1 cm.. No hydronephrosis is seen although there is
minimal fullness of the left renal pelvis and collecting system of
questionable significance. The echogenicity of the renal parenchyma
parenchyma is slightly increased.

Bladder:

The urinary bladder is very distended. No intrinsic abnormality is
noted.
IMPRESSION: 1. No definite hydronephrosis, with only minimal fullness of the
left pelvocaliceal system. No renal or ureteral calculi are seen.
2. Slightly echogenic renal parenchyma may indicate chronic renal
medical disease.
3. Very distended urinary bladder with no obvious abnormality.

## 2018-02-04 ENCOUNTER — Telehealth: Payer: Self-pay | Admitting: Internal Medicine

## 2018-02-04 NOTE — Telephone Encounter (Signed)
Attempted to contact patient this morning to schedule an appointment with Dr. Juleen China.  No answer left detailed message asking to please give me a call back.

## 2020-09-22 ENCOUNTER — Inpatient Hospital Stay
Admission: RE | Admit: 2020-09-22 | Discharge: 2020-12-04 | Disposition: A | Discharge status: Skilled Nursing Facility | Payer: Medicare Other | Attending: Internal Medicine | Admitting: Internal Medicine

## 2020-09-22 ENCOUNTER — Other Ambulatory Visit (HOSPITAL_COMMUNITY): Payer: Medicare Other

## 2020-09-22 DIAGNOSIS — Z9889 Other specified postprocedural states: Secondary | ICD-10-CM

## 2020-09-22 DIAGNOSIS — R4189 Other symptoms and signs involving cognitive functions and awareness: Secondary | ICD-10-CM

## 2020-09-22 DIAGNOSIS — R52 Pain, unspecified: Secondary | ICD-10-CM

## 2020-09-22 DIAGNOSIS — I469 Cardiac arrest, cause unspecified: Secondary | ICD-10-CM | POA: Diagnosis present

## 2020-09-22 DIAGNOSIS — R110 Nausea: Secondary | ICD-10-CM

## 2020-09-22 DIAGNOSIS — K3189 Other diseases of stomach and duodenum: Secondary | ICD-10-CM

## 2020-09-22 DIAGNOSIS — R6521 Severe sepsis with septic shock: Secondary | ICD-10-CM | POA: Diagnosis present

## 2020-09-22 DIAGNOSIS — J9690 Respiratory failure, unspecified, unspecified whether with hypoxia or hypercapnia: Secondary | ICD-10-CM

## 2020-09-22 DIAGNOSIS — Z4659 Encounter for fitting and adjustment of other gastrointestinal appliance and device: Secondary | ICD-10-CM

## 2020-09-22 DIAGNOSIS — G9341 Metabolic encephalopathy: Secondary | ICD-10-CM | POA: Diagnosis present

## 2020-09-22 DIAGNOSIS — R140 Abdominal distension (gaseous): Secondary | ICD-10-CM

## 2020-09-22 DIAGNOSIS — J9621 Acute and chronic respiratory failure with hypoxia: Secondary | ICD-10-CM | POA: Diagnosis present

## 2020-09-22 DIAGNOSIS — R188 Other ascites: Secondary | ICD-10-CM

## 2020-09-22 DIAGNOSIS — K567 Ileus, unspecified: Secondary | ICD-10-CM

## 2020-09-22 DIAGNOSIS — Z9911 Dependence on respirator [ventilator] status: Secondary | ICD-10-CM

## 2020-09-22 DIAGNOSIS — J189 Pneumonia, unspecified organism: Secondary | ICD-10-CM

## 2020-09-22 DIAGNOSIS — R404 Transient alteration of awareness: Secondary | ICD-10-CM

## 2020-09-22 DIAGNOSIS — L0291 Cutaneous abscess, unspecified: Secondary | ICD-10-CM

## 2020-09-22 DIAGNOSIS — R112 Nausea with vomiting, unspecified: Secondary | ICD-10-CM

## 2020-09-22 DIAGNOSIS — R0603 Acute respiratory distress: Secondary | ICD-10-CM

## 2020-09-22 DIAGNOSIS — Z931 Gastrostomy status: Secondary | ICD-10-CM

## 2020-09-22 DIAGNOSIS — R180 Malignant ascites: Secondary | ICD-10-CM

## 2020-09-22 DIAGNOSIS — R9389 Abnormal findings on diagnostic imaging of other specified body structures: Secondary | ICD-10-CM

## 2020-09-22 DIAGNOSIS — R198 Other specified symptoms and signs involving the digestive system and abdomen: Secondary | ICD-10-CM

## 2020-09-22 DIAGNOSIS — R509 Fever, unspecified: Secondary | ICD-10-CM

## 2020-09-22 DIAGNOSIS — Z992 Dependence on renal dialysis: Secondary | ICD-10-CM

## 2020-09-22 DIAGNOSIS — Z452 Encounter for adjustment and management of vascular access device: Secondary | ICD-10-CM

## 2020-09-22 HISTORY — DX: Dependence on renal dialysis: N18.6

## 2020-09-22 HISTORY — DX: Metabolic encephalopathy: G93.41

## 2020-09-22 HISTORY — DX: Acute and chronic respiratory failure with hypoxia: J96.21

## 2020-09-22 HISTORY — DX: Severe sepsis with septic shock: R65.21

## 2020-09-22 HISTORY — DX: Cardiac arrest, cause unspecified: I46.9

## 2020-09-22 LAB — BLOOD GAS, ARTERIAL
Acid-Base Excess: 0.2 mmol/L (ref 0.0–2.0)
Bicarbonate: 24.5 mmol/L (ref 20.0–28.0)
FIO2: 30
O2 Saturation: 96.8 %
Patient temperature: 36.7
pCO2 arterial: 40.5 mmHg (ref 32.0–48.0)
pH, Arterial: 7.397 (ref 7.350–7.450)
pO2, Arterial: 86.4 mmHg (ref 83.0–108.0)

## 2020-09-22 MED ORDER — IOHEXOL 300 MG/ML  SOLN: 50.0000 mL | Freq: Once | INTRAMUSCULAR | Status: DC | PRN

## 2020-09-23 ENCOUNTER — Encounter: Payer: Self-pay | Admitting: Internal Medicine

## 2020-09-23 ENCOUNTER — Other Ambulatory Visit (HOSPITAL_COMMUNITY): Payer: Medicare Other

## 2020-09-23 DIAGNOSIS — Z992 Dependence on renal dialysis: Secondary | ICD-10-CM

## 2020-09-23 DIAGNOSIS — N186 End stage renal disease: Secondary | ICD-10-CM

## 2020-09-23 DIAGNOSIS — G9341 Metabolic encephalopathy: Secondary | ICD-10-CM

## 2020-09-23 DIAGNOSIS — I469 Cardiac arrest, cause unspecified: Secondary | ICD-10-CM | POA: Diagnosis present

## 2020-09-23 DIAGNOSIS — J9621 Acute and chronic respiratory failure with hypoxia: Secondary | ICD-10-CM | POA: Diagnosis present

## 2020-09-23 DIAGNOSIS — R6521 Severe sepsis with septic shock: Secondary | ICD-10-CM

## 2020-09-23 LAB — COMPREHENSIVE METABOLIC PANEL
ALT: 37 U/L (ref 0–44)
AST: 85 U/L — ABNORMAL HIGH (ref 15–41)
Albumin: 1.2 g/dL — ABNORMAL LOW (ref 3.5–5.0)
Alkaline Phosphatase: 317 U/L — ABNORMAL HIGH (ref 38–126)
Anion gap: 12 (ref 5–15)
BUN: 27 mg/dL — ABNORMAL HIGH (ref 6–20)
CO2: 23 mmol/L (ref 22–32)
Calcium: 9 mg/dL (ref 8.9–10.3)
Chloride: 102 mmol/L (ref 98–111)
Creatinine, Ser: 2.99 mg/dL — ABNORMAL HIGH (ref 0.61–1.24)
GFR calc Af Amer: 26 mL/min — ABNORMAL LOW (ref >60)
GFR calc non Af Amer: 23 mL/min — ABNORMAL LOW (ref >60)
Glucose, Bld: 146 mg/dL — ABNORMAL HIGH (ref 70–99)
Potassium: 4 mmol/L (ref 3.5–5.1)
Sodium: 137 mmol/L (ref 135–145)
Total Bilirubin: 5.1 mg/dL — ABNORMAL HIGH (ref 0.3–1.2)
Total Protein: 7.1 g/dL (ref 6.5–8.1)

## 2020-09-23 LAB — CBC
HCT: 26.3 % — ABNORMAL LOW (ref 39.0–52.0)
Hemoglobin: 8 g/dL — ABNORMAL LOW (ref 13.0–17.0)
MCH: 28.5 pg (ref 26.0–34.0)
MCHC: 30.4 g/dL (ref 30.0–36.0)
MCV: 93.6 fL (ref 80.0–100.0)
Platelets: 509 10*3/uL — ABNORMAL HIGH (ref 150–400)
RBC: 2.81 MIL/uL — ABNORMAL LOW (ref 4.22–5.81)
RDW: 25.3 % — ABNORMAL HIGH (ref 11.5–15.5)
WBC: 17.7 10*3/uL — ABNORMAL HIGH (ref 4.0–10.5)
nRBC: 0.8 % — ABNORMAL HIGH (ref 0.0–0.2)

## 2020-09-23 NOTE — Consult Note (Signed)
Pulmonary [place]  Date of Service: 09/23/2020  PULMONARY CRITICAL CARE CONSULT   Jeremy Yu  OXB:[id number]  DOB: May 07, 1966   DOA: 09/22/2020  Referring Physician: Merton Border, MD  HPI: Jeremy Yu is a 54 y.o. male seen for follow up of Acute on Chronic Respiratory Failure.  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  Patient was admitted to the hospital with temperature of 38.7.  Patient apparently was C. difficile positive placed on oral vancomycin.  Also blood pressure was low given fluid resuscitation.  Patient was started on Dificid by infectious disease.  Patient had worsening of encephalopathy was transferred to the ICU and ended up intubated on mechanical ventilation.  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  Patient subsequently not able to come off the ventilator had increased work of breathing with every attempt at weaning.  Also suffered a cardiac arrest on September 13.  Patient was also noted to have a pleural effusion requiring thoracentesis.  Subsequently was not able to come off the ventilator transferred to our facility for further management and weaning  Review of Systems:  ROS performed and is unremarkable other than noted above.  Past Medical History:  Diagnosis Date  . Anxiety   . Asthma   . Blind in both eyes   . Chronic bronchitis ([place])   . Chronic renal failure    Sees [facility]/notes  05/14/2016  . Daily headache   . Depression   . Diabetic retinopathy ([place])    Archie Endo 11/02/2015  . DVT (deep venous thrombosis) ([place])    "? side" (10/03/2016)  . Falls frequently    "last fall was yesterday" (10/03/2016)  . Fibromyalgia   . GERD (gastroesophageal reflux disease)   . History of blood transfusion   . History of stomach ulcers   . Hyperlipidemia   . Hypertension   . Migraine    "weekly" (10/03/2016)  . Nephropathy    Archie Endo 11/02/2015  . Peripheral neuropathy ([place])    Archie Endo 11/02/2015  . Seizures ([place])    "light; none in the last 2 months; probably have 3/year" (10/03/2016)  . Symptomatic anemia 10/03/2016  . Type I diabetes mellitus ([place]) dx'd ~ 1987   Archie Endo 11/02/2015    Past Surgical History:  Procedure Laterality Date  . COLONOSCOPY N/A 10/06/2016   Procedure: COLONOSCOPY;  Surgeon: [name], MD;  Location: [place];  Service: Endoscopy;  Laterality: N/A;  . COLONOSCOPY N/A 10/07/2016   Procedure: COLONOSCOPY;  Surgeon: Manus Gunning, MD;  Location: [place];  Service: Gastroenterology;  Laterality: N/A;  would be ok with moderate I think  . ESOPHAGOGASTRODUODENOSCOPY N/A 10/05/2016   Procedure: ESOPHAGOGASTRODUODENOSCOPY (EGD);  Surgeon: [name], MD;  Location: [facility] ENDOSCOPY;  Service: Endoscopy;  Laterality: N/A;  . NO PAST SURGERIES      Social History:    reports that he has quit smoking. His smoking use included cigarettes. He has never used smokeless tobacco. He reports current drug use. Drug: Marijuana. He reports  that he does not drink alcohol.  Family History: Non-Contributory to the present illness  Allergies  Allergen Reactions  . Penicillins Shortness Of Breath and Itching    Has patient had a PCN reaction causing immediate rash, facial/tongue/throat swelling, SOB or lightheadedness with hypotension:YES Has patient had a PCN reaction causing severe rash involving mucus membranes or skin necrosis: NO Has  patient had a PCN reaction that required hospitalization NO Has patient had a PCN reaction occurring within the last 10 years: NO If all of the above answers are "NO", then may proceed with Cephalosporin use.     Medications: Reviewed on Rounds  Physical Exam:  Vitals: Temperature 98.6 pulse 88 respiratory rate 15 blood pressure is 118/62 saturations 94%  Ventilator Settings on assist control FiO2 is 30% tidal volume 500 PEEP 5  . General: Comfortable at this time . Eyes: Grossly normal lids, irises & conjunctiva . ENT: grossly tongue is normal . Neck: no obvious mass . Cardiovascular: S1-S2 normal no gallop or rub . Respiratory: No rhonchi no rales are noted at this time . Abdomen: Soft and nontender . Skin: no rash seen on limited exam . Musculoskeletal: not rigid . Psychiatric:unable to assess . Neurologic: no seizure no involuntary movements         Labs on Admission:  Basic Metabolic Panel: Recent Labs  Lab 09/23/20 0614  NA 137  K 4.0  CL 102  CO2 23  GLUCOSE 146*  BUN 27*  CREATININE 2.99*  CALCIUM 9.0    Recent Labs  Lab 09/22/20 1845  PHART 7.397  PCO2ART 40.5  PO2ART 86.4  HCO3 24.5  O2SAT 96.8    Liver Function Tests: Recent Labs  Lab 09/23/20 0614  AST 85*  ALT 37  ALKPHOS 317*  BILITOT 5.1*  PROT 7.1  ALBUMIN 1.2*   No results for input(s): LIPASE, AMYLASE in the last 168 hours. No results for input(s): AMMONIA in the last 168 hours.  CBC: Recent Labs  Lab 09/23/20 0614  WBC 17.7*  HGB 8.0*  HCT 26.3*  MCV 93.6  PLT 509*    Cardiac Enzymes: No results for input(s): CKTOTAL, CKMB, CKMBINDEX, TROPONINI in the last 168 hours.  BNP (last 3 results) No results for input(s): BNP in the last 8760 hours.  ProBNP (last 3 results) No results for input(s): PROBNP in the last 8760 hours.   Radiological Exams on Admission: DG ABDOMEN PEG TUBE LOCATION  Result Date: 09/22/2020 CLINICAL DATA:  Peg tube EXAM: ABDOMEN - 1 VIEW  COMPARISON:  None. FINDINGS: Single image of the left upper quadrant demonstrates contrast opacification of the stomach with percutaneous gastrostomy tube projecting over the proximal body of the stomach. No gross extravasation. IMPRESSION: Percutaneous gastrostomy tube overlies the proximal stomach. Electronically Signed   By: Donavan Foil M.D.   On: 09/22/2020 22:52   DG CHEST PORT 1 VIEW  Result Date: 09/23/2020 CLINICAL DATA:  Respiratory failure EXAM: PORTABLE CHEST 1 VIEW COMPARISON:  11/20/2016 FINDINGS: Tracheostomy tube tip of the level of the clavicular heads. Intermediate sized right pleural effusion with basilar atelectasis. Mild opacity at the left lung base, likely atelectasis. IMPRESSION: Intermediate sized right pleural effusion with basilar atelectasis. Electronically Signed   By: Ulyses Jarred M.D.   On: 09/23/2020 06:50    Assessment/Plan Active Problems:   Acute on chronic respiratory failure with hypoxia (HCC)   End stage renal failure on dialysis ([place])   Severe sepsis with septic shock (CODE) ([place])   Cardiac arrest ([place])  Metabolic encephalopathy   1. Acute on chronic respiratory failure with hypoxia respiratory therapy will check the RSB I mechanics and try to started on the wean protocol advance as tolerated.  Right now however patient is on full support on assist control mode on 30% FiO2. 2. End-stage renal failure on hemodialysis nephrology consultation and recommendations. 3. Severe sepsis with shock patient has multifactorial etiologies including initial issues with the C. difficile colitis patient was treated with multiple rounds of antibiotics we will continue to follow patient also did have peritonitis underwent colectomy. 4. Cardiac arrest rhythm right now has been stable we will need to continue to monitor closely. 5. Metabolic encephalopathy supportive care monitor electrolytes and sugars etc.  I have personally seen and evaluated the patient, evaluated  laboratory and imaging results, formulated the assessment and plan and placed orders. The Patient requires high complexity decision making with multiple systems involvement.  Case was discussed on Rounds with the Respiratory Therapy Director and the Respiratory staff Time Spent 78minutes  Abigael Mogle A Aragon Scarantino, MD [facility] Pulmonary Critical Care Medicine Sleep Medicine

## 2020-09-24 DIAGNOSIS — N186 End stage renal disease: Secondary | ICD-10-CM | POA: Diagnosis not present

## 2020-09-24 DIAGNOSIS — J9621 Acute and chronic respiratory failure with hypoxia: Secondary | ICD-10-CM | POA: Diagnosis not present

## 2020-09-24 DIAGNOSIS — G9341 Metabolic encephalopathy: Secondary | ICD-10-CM | POA: Diagnosis not present

## 2020-09-24 DIAGNOSIS — I469 Cardiac arrest, cause unspecified: Secondary | ICD-10-CM | POA: Diagnosis not present

## 2020-09-24 LAB — RENAL FUNCTION PANEL
Albumin: 1.2 g/dL — ABNORMAL LOW (ref 3.5–5.0)
Anion gap: 14 (ref 5–15)
BUN: 40 mg/dL — ABNORMAL HIGH (ref 6–20)
CO2: 18 mmol/L — ABNORMAL LOW (ref 22–32)
Calcium: 8.6 mg/dL — ABNORMAL LOW (ref 8.9–10.3)
Chloride: 105 mmol/L (ref 98–111)
Creatinine, Ser: 3.71 mg/dL — ABNORMAL HIGH (ref 0.61–1.24)
GFR calc Af Amer: 20 mL/min — ABNORMAL LOW (ref >60)
GFR calc non Af Amer: 17 mL/min — ABNORMAL LOW (ref >60)
Glucose, Bld: 194 mg/dL — ABNORMAL HIGH (ref 70–99)
Phosphorus: 2.5 mg/dL (ref 2.5–4.6)
Potassium: 4.8 mmol/L (ref 3.5–5.1)
Sodium: 137 mmol/L (ref 135–145)

## 2020-09-24 LAB — CBC
HCT: 24.9 % — ABNORMAL LOW (ref 39.0–52.0)
Hemoglobin: 7.7 g/dL — ABNORMAL LOW (ref 13.0–17.0)
MCH: 29.2 pg (ref 26.0–34.0)
MCHC: 30.9 g/dL (ref 30.0–36.0)
MCV: 94.3 fL (ref 80.0–100.0)
Platelets: 538 10*3/uL — ABNORMAL HIGH (ref 150–400)
RBC: 2.64 MIL/uL — ABNORMAL LOW (ref 4.22–5.81)
RDW: 25.5 % — ABNORMAL HIGH (ref 11.5–15.5)
WBC: 17.5 10*3/uL — ABNORMAL HIGH (ref 4.0–10.5)
nRBC: 1.1 % — ABNORMAL HIGH (ref 0.0–0.2)

## 2020-09-24 LAB — T4, FREE: Free T4: 0.7 ng/dL (ref 0.61–1.12)

## 2020-09-24 LAB — HEMOGLOBIN A1C
Hgb A1c MFr Bld: 5.2 % (ref 4.8–5.6)
Mean Plasma Glucose: 102.54 mg/dL

## 2020-09-24 LAB — MAGNESIUM: Magnesium: 2.1 mg/dL (ref 1.7–2.4)

## 2020-09-24 LAB — TSH: TSH: 6.889 u[IU]/mL — ABNORMAL HIGH (ref 0.350–4.500)

## 2020-09-24 NOTE — Progress Notes (Signed)
Pulmonary Critical Care Medicine [place]   PULMONARY CRITICAL CARE SERVICE  PROGRESS NOTE  Date of Service: 09/24/2020  Jeremy Yu  LGX:[id number]  DOB: 11/17/1966   DOA: 09/22/2020  Referring Physician: Merton Border, MD  HPI: Jeremy Yu is a 54 y.o. male seen for follow up of Acute on Chronic Respiratory Failure.  Patient is comfortable right now without distress weaning on pressure support with a goal of 4 hours  Medications: Reviewed on Rounds  Physical Exam:  Vitals: Temperature 96.6 pulse 89 respiratory rate 10 blood pressure is 104/59 saturations 99%  Ventilator Settings on pressure support FiO2 is 28% pressure 12/5  . General: Comfortable at this time . Eyes: Grossly normal lids, irises & conjunctiva . ENT: grossly tongue is normal . Neck: no obvious mass . Cardiovascular: S1 S2 normal no gallop . Respiratory: No rhonchi no rales are noted at this time . Abdomen: soft . Skin: no rash seen on limited exam . Musculoskeletal: not rigid . Psychiatric:unable to assess . Neurologic: no seizure no involuntary movements         Lab Data:   Basic Metabolic Panel: Recent Labs  Lab 09/23/20 0614 09/24/20 0542  NA 137 137  K 4.0 4.8  CL 102 105  CO2 23 18*  GLUCOSE 146* 194*  BUN 27* 40*  CREATININE 2.99* 3.71*  CALCIUM 9.0 8.6*  MG  --  2.1  PHOS  --  2.5    ABG: Recent Labs  Lab 09/22/20 1845  PHART 7.397  PCO2ART 40.5  PO2ART 86.4  HCO3 24.5  O2SAT 96.8    Liver Function Tests: Recent Labs  Lab 09/23/20 0614 09/24/20 0542  AST 85*  --   ALT 37  --   ALKPHOS 317*  --   BILITOT 5.1*  --   PROT 7.1  --   ALBUMIN 1.2* 1.2*   No results for input(s): LIPASE, AMYLASE in the last 168 hours. No results for input(s): AMMONIA in the last 168 hours.  CBC: Recent Labs  Lab 09/23/20 0614 09/24/20 0542  WBC 17.7* 17.5*  HGB 8.0* 7.7*  HCT 26.3* 24.9*  MCV 93.6 94.3  PLT 509* 538*    Cardiac Enzymes: No  results for input(s): CKTOTAL, CKMB, CKMBINDEX, TROPONINI in the last 168 hours.  BNP (last 3 results) No results for input(s): BNP in the last 8760 hours.  ProBNP (last 3 results) No results for input(s): PROBNP in the last 8760 hours.  Radiological Exams: DG ABDOMEN PEG TUBE LOCATION  Result Date: 09/22/2020 CLINICAL DATA:  Peg tube EXAM: ABDOMEN - 1 VIEW COMPARISON:  None. FINDINGS: Single image of the left upper quadrant demonstrates contrast opacification of the stomach with percutaneous gastrostomy tube projecting over the proximal body of the stomach. No gross extravasation. IMPRESSION: Percutaneous gastrostomy tube overlies the proximal stomach. Electronically Signed   By: Donavan Foil M.D.   On: 09/22/2020 22:52   DG CHEST PORT 1 VIEW  Result Date: 09/23/2020 CLINICAL DATA:  Respiratory failure EXAM: PORTABLE CHEST 1 VIEW COMPARISON:  11/20/2016 FINDINGS: Tracheostomy tube tip of the level of the clavicular heads. Intermediate sized right pleural effusion with basilar atelectasis. Mild opacity at the left lung base, likely atelectasis. IMPRESSION: Intermediate sized right pleural effusion with basilar atelectasis. Electronically Signed   By: Ulyses Jarred M.D.   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 Acute on chronic respiratory failure with hypoxia we will continue with weaning on pressure support the goal is 4 hours. 2. End-stage renal failure on hemodialysis 3. Severe sepsis with shock resolved 4. Cardiac arrest rhythm is stable 5. Metabolic encephalopathy no change we will continue to monitor   I have personally seen and evaluated the patient, evaluated laboratory and imaging results, formulated the assessment and plan and placed orders. The Patient  requires high complexity decision making with multiple systems involvement.  Rounds were done with the Respiratory Therapy Director and Staff therapists and discussed with nursing staff also.  Allyne Gee, MD [facility] Pulmonary Critical Care Medicine Sleep Medicine

## 2020-09-25 DIAGNOSIS — I469 Cardiac arrest, cause unspecified: Secondary | ICD-10-CM | POA: Diagnosis not present

## 2020-09-25 DIAGNOSIS — G9341 Metabolic encephalopathy: Secondary | ICD-10-CM | POA: Diagnosis not present

## 2020-09-25 DIAGNOSIS — J9621 Acute and chronic respiratory failure with hypoxia: Secondary | ICD-10-CM | POA: Diagnosis not present

## 2020-09-25 DIAGNOSIS — N186 End stage renal disease: Secondary | ICD-10-CM | POA: Diagnosis not present

## 2020-09-25 LAB — HEPATITIS B CORE ANTIBODY, TOTAL: Hep B Core Total Ab: REACTIVE — AB

## 2020-09-25 LAB — MAGNESIUM: Magnesium: 2.1 mg/dL (ref 1.7–2.4)

## 2020-09-25 LAB — RENAL FUNCTION PANEL
Albumin: 1.1 g/dL — ABNORMAL LOW (ref 3.5–5.0)
Anion gap: 11 (ref 5–15)
BUN: 51 mg/dL — ABNORMAL HIGH (ref 6–20)
CO2: 21 mmol/L — ABNORMAL LOW (ref 22–32)
Calcium: 8.8 mg/dL — ABNORMAL LOW (ref 8.9–10.3)
Chloride: 106 mmol/L (ref 98–111)
Creatinine, Ser: 4.57 mg/dL — ABNORMAL HIGH (ref 0.61–1.24)
GFR calc Af Amer: 16 mL/min — ABNORMAL LOW (ref >60)
GFR calc non Af Amer: 14 mL/min — ABNORMAL LOW (ref >60)
Glucose, Bld: 192 mg/dL — ABNORMAL HIGH (ref 70–99)
Phosphorus: 2.5 mg/dL (ref 2.5–4.6)
Potassium: 3.6 mmol/L (ref 3.5–5.1)
Sodium: 138 mmol/L (ref 135–145)

## 2020-09-25 LAB — CBC
HCT: 24.8 % — ABNORMAL LOW (ref 39.0–52.0)
Hemoglobin: 7.9 g/dL — ABNORMAL LOW (ref 13.0–17.0)
MCH: 29.4 pg (ref 26.0–34.0)
MCHC: 31.9 g/dL (ref 30.0–36.0)
MCV: 92.2 fL (ref 80.0–100.0)
Platelets: 560 10*3/uL — ABNORMAL HIGH (ref 150–400)
RBC: 2.69 MIL/uL — ABNORMAL LOW (ref 4.22–5.81)
RDW: 24.9 % — ABNORMAL HIGH (ref 11.5–15.5)
WBC: 17.2 10*3/uL — ABNORMAL HIGH (ref 4.0–10.5)
nRBC: 1 % — ABNORMAL HIGH (ref 0.0–0.2)

## 2020-09-25 LAB — CULTURE, RESPIRATORY W GRAM STAIN

## 2020-09-25 LAB — HEPATITIS B SURFACE ANTIBODY,QUALITATIVE: Hep B S Ab: REACTIVE — AB

## 2020-09-25 LAB — HEPATITIS B SURFACE ANTIGEN: Hepatitis B Surface Ag: NONREACTIVE

## 2020-09-25 NOTE — Consult Note (Signed)
[facility] CONSULT NOTE    Date: 09/25/2020                  Patient Name:  Jeremy Yu  MRN: [id number]  DOB: 08/18/1966  Age / Sex: 54 y.o., male         PCP: No primary care provider on file.                 Service Requesting Consult:  Hospitalist                 Reason for Consult:  Evaluation management of ESRD            History of Present Illness: Patient is a 54 y.o. male with a PMHx of ESRD on HD, C. 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  He was admitted from 08/19/2020 to 09/22/2020.  Patient has had multiple admissions in the past. Prior to His most recent admission to the outside hospital he had necrotizing soft tissue infection with associated sepsis.  He underwent right above-the-knee amputation on 07/17/2020.  He then returned with diarrhea and found to be C. difficile positive.  He had pancolitis at that time.  He underwent colonic resection with colostomy placement.  He had quite a complicated course from there.  He did receive dialysis during his admission via a left upper extremity AV fistula.  He is currently awake and alert and will follow simple commands.  Patient also has anemia of chronic kidney disease with most recent hemoglobin of 7.9.  Patient also has underlying secondary hyperparathyroidism that will require further evaluation.   Medications: Outpatient medications: Medications Prior to Admission  Medication Sig Dispense Refill Last Dose  . albuterol (PROVENTIL HFA;VENTOLIN HFA) 108 (90 Base) MCG/ACT inhaler Inhale 1-2 puffs into  the lungs every 6 (six) hours as needed for wheezing or shortness of breath. 1 Inhaler 3   . aspirin EC 81 MG tablet Take 1 tablet (81 mg total) by mouth daily. 30 tablet 3   . atorvastatin (LIPITOR) 40 MG tablet Take 1 tablet (40 mg total) by mouth daily. 90 tablet 1   . beclomethasone (QVAR) 80 MCG/ACT inhaler Inhale 2 puffs into the lungs 2 (two) times daily. 1 Inhaler 2   . calcium-vitamin D (OSCAL WITH D) 500-200 MG-UNIT tablet Take 1 tablet by mouth daily. 30 tablet 1   . furosemide (LASIX) 80 MG tablet Take 1 tablet (80 mg total) by mouth daily. 90 tablet 1   . gabapentin (NEURONTIN) 300 MG capsule Take 2 capsules (600 mg total) by mouth daily. 60 capsule 2   . hydrALAZINE (APRESOLINE) 50 MG tablet Take 1 tablet (50 mg total) by mouth 3 (three) times daily. 270 tablet 1   . insulin aspart (NOVOLOG FLEXPEN) 100 UNIT/ML FlexPen Inject 10 Units into the skin 3 (three) times daily with meals. 15 mL 11   . Insulin Glargine (LANTUS SOLOSTAR) 100 UNIT/ML Solostar Pen Inject 22 Units into the skin 2 (two) times daily at 8 am and 10 pm. 15 mL 11   . nitroGLYCERIN (NITROSTAT) 0.4 MG SL tablet Place 1 tablet (0.4 mg total) under the tongue every 5 (five) minutes as needed for chest  pain. 30 tablet 12   . verapamil (CALAN-SR) 180 MG CR tablet Take 2 tablets (360 mg total) by mouth daily. 180 tablet 1     Current medications: Daptomycin 100 mg IV every 48 hours, budesonide 0.5 mg inhaled twice daily, docusate 10 mg daily, fish oil 4 g daily, fluoxetine 40 mg daily, heparin 5000 units subcutaneous twice daily, melatonin 3 mg nightly, metoprolol 12.5 mg twice daily, modafinil 100 mg every morning, Nephro-Vite 1 tablet daily, prostat 30 cc 3 times daily, Protonix 40 mg twice daily, sertraline 25 mg daily, multivitamin 1 tablet daily  Allergies: Allergies  Allergen Reactions  . Penicillins Shortness Of Breath and Itching    Has patient had a PCN reaction causing immediate rash, facial/tongue/throat  swelling, SOB or lightheadedness with hypotension:YES Has patient had a PCN reaction causing severe rash involving mucus membranes or skin necrosis: NO Has patient had a PCN reaction that required hospitalization NO Has patient had a PCN reaction occurring within the last 10 years: NO If all of the above answers are "NO", then may proceed with Cephalosporin use.       Past Medical History: Past Medical History:  Diagnosis Date  . Acute on chronic respiratory failure with hypoxia ([place])   . Anxiety   . Asthma   . Blind in both eyes   . Cardiac arrest ([place])   . Chronic bronchitis ([place])   . Chronic renal failure    Sees [facility]/notes 05/14/2016  . Daily headache   . Depression   . Diabetic retinopathy ([place])    Archie Endo 11/02/2015  . DVT (deep venous thrombosis) ([place])    "? side" (10/03/2016)  . End stage renal failure on dialysis ([place])   . Falls frequently    "last fall was yesterday" (10/03/2016)  . Fibromyalgia   . GERD (gastroesophageal reflux disease)   . History of blood transfusion   . History of stomach ulcers   . Hyperlipidemia   . Hypertension   . Metabolic encephalopathy   . Migraine    "weekly" (10/03/2016)  . Nephropathy    [facility] Endo 11/02/2015  . Peripheral neuropathy    Ar[facility] Endo 11/02/2015  . Seizures ([place])    "light; none in the last 2 months; probably have 3/year" (10/03/2016)  . Severe sepsis with septic shock (CODE) ([place])   . Symptomatic anemia 10/03/2016  . Type I diabetes mellitus ([place]) dx'd ~ 1987   [facility] Endo 11/02/2015     Past Surgical History: Past Surgical History:  Procedure Laterality Date  . COLONOSCOPY N/A 10/06/2016   Procedure: COLONOSCOPY;  Surgeon: [name], MD;  Location: [place];  Service: Endoscopy;  Laterality: N/A;  . COLONOSCOPY N/A 10/07/2016   Procedure: COLONOSCOPY;  Surgeon: Manus Gunning, MD;  Location: [facility];  Service: Gastroenterology;  Laterality: N/A;  would be ok with moderate I think  .  ESOPHAGOGASTRODUODENOSCOPY N/A 10/05/2016   Procedure: ESOPHAGOGASTRODUODENOSCOPY (EGD);  Surgeon: [name], MD;  Location: [facility] ENDOSCOPY;  Service: Endoscopy;  Laterality: N/A;  . NO PAST SURGERIES       Family History: Family History  Problem Relation Age of Onset  . Diabetes Mother   . Hypertension Mother   . Heart failure Mother   . Blindness Mother   . GER disease Mother   . Hypertension Father   . Cancer Father   . Arthritis Father      Social History: Social History   Socioeconomic History  . Marital status: Single    Spouse name:  Not on file  . Number of children: Not on file  . Years of education: Not on file  . Highest education level: Not on file  Occupational History  . Not on file  Tobacco Use  . Smoking status: Former Smoker    Types: Cigarettes  . Smokeless tobacco: Never Used  . Tobacco comment: 10/03/2016 "smoked maybe 3 cigarettes/week; stopped when I was 18"  Substance and Sexual Activity  . Alcohol use: No    Alcohol/week: 0.0 standard drinks    Comment: 10/03/2016 "drank some in my younger days"  . Drug use: Yes    Types: Marijuana    Comment: 10/03/2016 "back in my younger days; nothing in years"  . Sexual activity: Never  Other Topics Concern  . Not on file  Social History Narrative  . Not on file   Social Determinants of Health   Financial Resource Strain:   . Difficulty of Paying Living Expenses: Not on file  Food Insecurity:   . Worried About Charity fundraiser in the Last Year: Not on file  . Ran Out of Food in the Last Year: Not on file  Transportation Needs:   . Lack of Transportation (Medical): Not on file  . Lack of Transportation (Non-Medical): Not on file  Physical Activity:   . Days of Exercise per Week: Not on file  . Minutes of Exercise per Session: Not on file  Stress:   . Feeling of Stress : Not on file  Social Connections:   . Frequency of Communication with Friends and Family: Not on file  . Frequency of  Social Gatherings with Friends and Family: Not on file  . Attends Religious Services: Not on file  . Active Member of Clubs or Organizations: Not on file  . Attends Archivist Meetings: Not on file  . Marital Status: Not on file  Intimate Partner Violence:   . Fear of Current or Ex-Partner: Not on file  . Emotionally Abused: Not on file  . Physically Abused: Not on file  . Sexually Abused: Not on file     Review of Systems: Unable to provide as he is currently on the ventilator  Vital Signs: Temperature 98.5 pulse 78 respirations 16 blood pressure 107/59 pulse ox 98% Weight trends: There were no vitals filed for this visit.  Physical Exam: Physical Exam: General:  Chronically ill-appearing  Head:  Normocephalic, atraumatic. Moist oral mucosal membranes  Eyes:  Anicteric  Neck:  Tracheostomy in place  Lungs:   Coarse breath sounds bilateral, normal effort  Heart:  S1S2 no rubs  Abdomen:   Soft, nontender, bowel sounds present  Extremities:  Right above-the-knee amputation, trace left lower extremity edema  Neurologic:  Awake, alert, following commands  Skin:  No lesions  Access:  Left upper extremity AV fistula, positive bruit and thrill    Lab results: Basic Metabolic Panel: Recent Labs  Lab 09/23/20 0614 09/24/20 0542 09/25/20 0612  NA 137 137 138  K 4.0 4.8 3.6  CL 102 105 106  CO2 23 18* 21*  GLUCOSE 146* 194* 192*  BUN 27* 40* 51*  CREATININE 2.99* 3.71* 4.57*  CALCIUM 9.0 8.6* 8.8*  MG  --  2.1 2.1  PHOS  --  2.5 2.5    Liver Function Tests: Recent Labs  Lab 09/23/20 0614 09/24/20 0542 09/25/20 0612  AST 85*  --   --   ALT 37  --   --   ALKPHOS 317*  --   --  BILITOT 5.1*  --   --   PROT 7.1  --   --   ALBUMIN 1.2* 1.2* 1.1*   No results for input(s): LIPASE, AMYLASE in the last 168 hours. No results for input(s): AMMONIA in the last 168 hours.  CBC: Recent Labs  Lab 09/23/20 0614 09/24/20 0542 09/25/20 0612  WBC 17.7* 17.5*  17.2*  HGB 8.0* 7.7* 7.9*  HCT 26.3* 24.9* 24.8*  MCV 93.6 94.3 92.2  PLT 509* 538* 560*    Cardiac Enzymes: No results for input(s): CKTOTAL, CKMB, CKMBINDEX, TROPONINI in the last 168 hours.  BNP: Invalid input(s): POCBNP  CBG: No results for input(s): GLUCAP in the last 168 hours.  Microbiology: Results for orders placed or performed during the hospital encounter of 09/22/20  Culture, respiratory (non-expectorated)     Status: None   Collection Time: 09/23/20 11:25 AM   Specimen: Tracheal Aspirate; Respiratory  Result Value Ref Range Status   Specimen Description TRACHEAL ASPIRATE  Final   Special Requests NONE  Final   Gram Stain   Final    RARE WBC PRESENT,BOTH PMN AND MONONUCLEAR NO ORGANISMS SEEN Performed at [facility], [address], [place], [place] [address]    Culture FEW KLEBSIELLA PNEUMONIAE  Final   Report Status 09/25/2020 FINAL  Final   Organism ID, Bacteria KLEBSIELLA PNEUMONIAE  Final      Susceptibility   Klebsiella pneumoniae - MIC*    AMPICILLIN >=32 RESISTANT Resistant     CEFAZOLIN <=4 SENSITIVE Sensitive     CEFEPIME 0.25 SENSITIVE Sensitive     CEFTAZIDIME <=1 SENSITIVE Sensitive     CEFTRIAXONE 1 SENSITIVE Sensitive     CIPROFLOXACIN <=0.25 SENSITIVE Sensitive     GENTAMICIN <=1 SENSITIVE Sensitive     IMIPENEM <=0.25 SENSITIVE Sensitive     TRIMETH/SULFA <=20 SENSITIVE Sensitive     AMPICILLIN/SULBACTAM 16 INTERMEDIATE Intermediate     PIP/TAZO 64 INTERMEDIATE Intermediate     * FEW KLEBSIELLA PNEUMONIAE  Culture, blood (routine x 2)     Status: None (Preliminary result)   Collection Time: 09/23/20  4:26 PM   Specimen: BLOOD  Result Value Ref Range Status   Specimen Description BLOOD BLOOD RIGHT HAND  Final   Special Requests   Final    AEROBIC BOTTLE ONLY Blood Culture results may not be optimal due to an inadequate volume of blood received in culture bottles   Culture   Final    NO GROWTH 2 DAYS Performed at [facility], [address] [place], [place] [address]    Report Status PENDING  Incomplete  Culture, blood (single)     Status: None (Preliminary result)   Collection Time: 09/24/20  6:00 AM   Specimen: BLOOD RIGHT HAND  Result Value Ref Range Status   Specimen Description BLOOD RIGHT HAND  Final   Special Requests   Final    BOTTLES DRAWN AEROBIC ONLY Blood Culture results may not be optimal due to an inadequate volume of blood received in culture bottles   Culture   Final    NO GROWTH 1 DAY Performed at [facility], [address], [place], [place] [address]    Report Status PENDING  Incomplete    Coagulation Studies: No results for input(s): LABPROT, INR in the last 72 hours.  Urinalysis: No results for input(s): COLORURINE, LABSPEC, PHURINE, GLUCOSEU, HGBUR, BILIRUBINUR, KETONESUR, PROTEINUR, UROBILINOGEN, NITRITE, LEUKOCYTESUR in the last 72 hours.  Invalid input(s): APPERANCEUR  Imaging:  No results found.   Assessment & Plan: Pt is a 54 y.o. male with a PMHx of ESRD on HD, C. 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   1.  ESRD on HD.  We will plan for hemodialysis on MWF schedule.  We will use his left upper extremity AV fistula.  This was functioning well at the prior facility.  Ultrafiltration target of 1.5 kg for now.  2.  Anemia of chronic kidney disease.  Start the patient on Retacrit 10,000 units IV with dialysis treatments.  3.  Secondary hyperparathyroidism.  Monitor calcium, phosphorus over the course of hospitalization.  Hold off on binders for now.  4.   Acute respiratory failure.  Still maintained on the ventilator.  Pulmonary/critical care following and will hopefully have the patient wean from the ventilator.

## 2020-09-25 NOTE — Progress Notes (Signed)
Pulmonary Critical Care Medicine [place]   PULMONARY CRITICAL CARE SERVICE  PROGRESS NOTE  Date of Service: 09/25/2020  Jeremy Yu  QZE:[id number]  DOB: 1966/07/20   DOA: 09/22/2020  Referring Physician: Merton Border, MD  HPI: Jeremy Yu is a 54 y.o. male seen for follow up of Acute on Chronic Respiratory Failure.  At this time patient is on full support on split scheduled for pressure support wean for about 8 hours today  Medications: Reviewed on Rounds  Physical Exam:  Vitals: Temperature is 98.5 pulse 87 respiratory rate 15 blood pressure is 107/59 saturations 98%  Ventilator Settings on assist control FiO2 28% tidal volume 500 PEEP 5   General: Comfortable at this time  Eyes: Grossly normal lids, irises & conjunctiva  ENT: grossly tongue is normal  Neck: no obvious mass  Cardiovascular: S1 S2 normal no gallop  Respiratory: No rhonchi very coarse breath sounds  Abdomen: soft  Skin: no rash seen on limited exam  Musculoskeletal: not rigid  Psychiatric:unable to assess  Neurologic: no seizure no involuntary movements         Lab Data:   Basic Metabolic Panel: Recent Labs  Lab 09/23/20 0614 09/24/20 0542 09/25/20 0612  NA 137 137 138  K 4.0 4.8 3.6  CL 102 105 106  CO2 23 18* 21*  GLUCOSE 146* 194* 192*  BUN 27* 40* 51*  CREATININE 2.99* 3.71* 4.57*  CALCIUM 9.0 8.6* 8.8*  MG  --  2.1 2.1  PHOS  --  2.5 2.5    ABG: Recent Labs  Lab 09/22/20 1845  PHART 7.397  PCO2ART 40.5  PO2ART 86.4  HCO3 24.5  O2SAT 96.8    Liver Function Tests: Recent Labs  Lab 09/23/20 0614 09/24/20 0542 09/25/20 0612  AST 85*  --   --   ALT 37  --   --   ALKPHOS 317*  --   --   BILITOT 5.1*  --   --   PROT 7.1  --   --   ALBUMIN 1.2* 1.2* 1.1*   No results for input(s): LIPASE, AMYLASE in the last 168 hours. No results for input(s): AMMONIA in the last 168 hours.  CBC: Recent Labs  Lab 09/23/20 0614 09/24/20 0542  09/25/20 0612  WBC 17.7* 17.5* 17.2*  HGB 8.0* 7.7* 7.9*  HCT 26.3* 24.9* 24.8*  MCV 93.6 94.3 92.2  PLT 509* 538* 560*    Cardiac Enzymes: No results for input(s): CKTOTAL, CKMB, CKMBINDEX, TROPONINI in the last 168 hours.  BNP (last 3 results) No results for input(s): BNP in the last 8760 hours.  ProBNP (last 3 results) No results for input(s): PROBNP in the last 8760 hours.  Radiological Exams: No results found.  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 Acute on chronic respiratory failure with hypoxia we will continue with full support on the ventilator and wean as per protocol on pressure support for 8 hours today 2. End-stage renal failure on hemodialysis nephrology consultation 3. Severe sepsis with shock 4. Cardiac arrest rhythm stable we will monitor on telemetry 5. Metabolic encephalopathy unchanged   I have personally seen and evaluated the patient, evaluated laboratory and imaging results, formulated the assessment and plan and placed orders. The Patient requires high complexity decision making with multiple  systems involvement.  Rounds were done with the Respiratory Therapy Director and Staff therapists and discussed with nursing staff also.  Allyne Gee, MD [facility] Pulmonary Critical Care Medicine Sleep Medicine

## 2020-09-26 ENCOUNTER — Other Ambulatory Visit (HOSPITAL_COMMUNITY): Payer: Medicare Other

## 2020-09-26 DIAGNOSIS — N186 End stage renal disease: Secondary | ICD-10-CM | POA: Diagnosis not present

## 2020-09-26 DIAGNOSIS — I469 Cardiac arrest, cause unspecified: Secondary | ICD-10-CM | POA: Diagnosis not present

## 2020-09-26 DIAGNOSIS — G9341 Metabolic encephalopathy: Secondary | ICD-10-CM | POA: Diagnosis not present

## 2020-09-26 DIAGNOSIS — J9621 Acute and chronic respiratory failure with hypoxia: Secondary | ICD-10-CM | POA: Diagnosis not present

## 2020-09-26 NOTE — Progress Notes (Addendum)
Pulmonary Critical Care Medicine [place]   PULMONARY CRITICAL CARE SERVICE  PROGRESS NOTE  Date of Service: 09/26/2020  Jeremy Yu  ENI:[id number]  DOB: [birth year]   DOA: 09/22/2020  Referring Physician: Merton Border, MD  HPI: Jeremy Yu is a 54 y.o. male seen for follow up of Acute on Chronic Respiratory Failure.  Patient is currently working towards a 48-hour goal on pressure support FiO2 28% satting well no fever distress.  Medications: Reviewed on Rounds  Physical Exam:  Vitals: Pulse 97 respirations 19 BP 113/62 O2 sat 100% temp 97.9  Ventilator Settings per support 12/5 FiO2 28%  . General: Comfortable at this time . Eyes: Grossly normal lids, irises & conjunctiva . ENT: grossly tongue is normal . Neck: no obvious mass . Cardiovascular: S1 S2 normal no gallop . Respiratory: Coarse breath sounds . Abdomen: soft . Skin: no rash seen on limited exam . Musculoskeletal: not rigid . Psychiatric:unable to assess . Neurologic: no seizure no involuntary movements         Lab Data:   Basic Metabolic Panel: Recent Labs  Lab 09/23/20 0614 09/24/20 0542 09/25/20 0612  NA 137 137 138  K 4.0 4.8 3.6  CL 102 105 106  CO2 23 18* 21*  GLUCOSE 146* 194* 192*  BUN 27* 40* 51*  CREATININE 2.99* 3.71* 4.57*  CALCIUM 9.0 8.6* 8.8*  MG  --  2.1 2.1  PHOS  --  2.5 2.5    ABG: Recent Labs  Lab 09/22/20 1845  PHART 7.397  PCO2ART 40.5  PO2ART 86.4  HCO3 24.5  O2SAT 96.8    Liver Function Tests: Recent Labs  Lab 09/23/20 0614 09/24/20 0542 09/25/20 0612  AST 85*  --   --   ALT 37  --   --   ALKPHOS 317*  --   --   BILITOT 5.1*  --   --   PROT 7.1  --   --   ALBUMIN 1.2* 1.2* 1.1*   No results for input(s): LIPASE, AMYLASE in the last 168 hours. No results for input(s): AMMONIA in the last 168 hours.  CBC: Recent Labs  Lab 09/23/20 0614 09/24/20 0542 09/25/20 0612  WBC 17.7* 17.5* 17.2*  HGB 8.0* 7.7* 7.9*  HCT 26.3*  24.9* 24.8*  MCV 93.6 94.3 92.2  PLT 509* 538* 560*    Cardiac Enzymes: No results for input(s): CKTOTAL, CKMB, CKMBINDEX, TROPONINI in the last 168 hours.  BNP (last 3 results) No results for input(s): BNP in the last 8760 hours.  ProBNP (last 3 results) No results for input(s): PROBNP in the last 8760 hours.  Radiological Exams: No results found.  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 Acute on chronic respiratory failure with hypoxia patient will continue on pressure support 12/5 and FiO2 20% with his 48-hour goal.  We will continue aggressive pulmonary toilet supportive measures. 2. End-stage renal failure on hemodialysis nephrology consultation 3. Severe sepsis with shock 4. Cardiac arrest rhythm stable we will monitor on telemetry 5. Metabolic encephalopathy unchanged   I have personally seen and evaluated the patient, evaluated laboratory and imaging results, formulated the assessment and plan and placed orders. The Patient requires high complexity decision making with multiple systems involvement.  Rounds were done with the Respiratory Therapy Director  and Staff therapists and discussed with nursing staff also.  Allyne Gee, MD [facility] Pulmonary Critical Care Medicine Sleep Medicine

## 2020-09-27 ENCOUNTER — Other Ambulatory Visit (HOSPITAL_COMMUNITY): Payer: Medicare Other

## 2020-09-27 DIAGNOSIS — N186 End stage renal disease: Secondary | ICD-10-CM | POA: Diagnosis not present

## 2020-09-27 DIAGNOSIS — I469 Cardiac arrest, cause unspecified: Secondary | ICD-10-CM | POA: Diagnosis not present

## 2020-09-27 DIAGNOSIS — J9621 Acute and chronic respiratory failure with hypoxia: Secondary | ICD-10-CM | POA: Diagnosis not present

## 2020-09-27 DIAGNOSIS — G9341 Metabolic encephalopathy: Secondary | ICD-10-CM | POA: Diagnosis not present

## 2020-09-27 LAB — CBC
HCT: 21.3 % — ABNORMAL LOW (ref 39.0–52.0)
Hemoglobin: 6.9 g/dL — CL (ref 13.0–17.0)
MCH: 29.1 pg (ref 26.0–34.0)
MCHC: 32.4 g/dL (ref 30.0–36.0)
MCV: 89.9 fL (ref 80.0–100.0)
Platelets: 549 10*3/uL — ABNORMAL HIGH (ref 150–400)
RBC: 2.37 MIL/uL — ABNORMAL LOW (ref 4.22–5.81)
RDW: 23.8 % — ABNORMAL HIGH (ref 11.5–15.5)
WBC: 16.5 10*3/uL — ABNORMAL HIGH (ref 4.0–10.5)
nRBC: 0.7 % — ABNORMAL HIGH (ref 0.0–0.2)

## 2020-09-27 LAB — RENAL FUNCTION PANEL
Albumin: 1.1 g/dL — ABNORMAL LOW (ref 3.5–5.0)
Anion gap: 14 (ref 5–15)
BUN: 34 mg/dL — ABNORMAL HIGH (ref 6–20)
CO2: 20 mmol/L — ABNORMAL LOW (ref 22–32)
Calcium: 8.5 mg/dL — ABNORMAL LOW (ref 8.9–10.3)
Chloride: 100 mmol/L (ref 98–111)
Creatinine, Ser: 3.67 mg/dL — ABNORMAL HIGH (ref 0.61–1.24)
GFR calc non Af Amer: 18 mL/min — ABNORMAL LOW (ref >60)
Glucose, Bld: 186 mg/dL — ABNORMAL HIGH (ref 70–99)
Phosphorus: 2.2 mg/dL — ABNORMAL LOW (ref 2.5–4.6)
Potassium: 3 mmol/L — ABNORMAL LOW (ref 3.5–5.1)
Sodium: 134 mmol/L — ABNORMAL LOW (ref 135–145)

## 2020-09-27 LAB — MAGNESIUM: Magnesium: 2 mg/dL (ref 1.7–2.4)

## 2020-09-27 LAB — PREPARE RBC (CROSSMATCH)

## 2020-09-27 LAB — POTASSIUM: Potassium: 2.5 mmol/L — CL (ref 3.5–5.1)

## 2020-09-27 NOTE — Progress Notes (Signed)
[facility]  ROUNDING NOTE   Subjective:  Patient seen and evaluated during hemodialysis treatment. Patient developed hypotension shortly after starting dialysis treatment. Dialysate temperature 36. We have ordered albumin for the treatment. In addition midodrine to be increased.   Objective:  Vital signs in last 24 hours:  Temperature 98 pulse 92 respirations 26 blood pressure 110/63   Physical Exam: General:  Chronically ill-appearing  Head:  Normocephalic, atraumatic. Moist oral mucosal membranes  Eyes:  Anicteric  Neck:  Tracheostomy in place  Lungs:   Coarse breath sounds bilateral, normal effort  Heart:  S1S2 no rubs  Abdomen:   Soft, nontender, bowel sounds present, PEG in place  Extremities:  Right AKA  Neurologic:  Awake, alert, following commands  Skin:  No lesions  Access:  Left upper extremity AV fistula    Basic Metabolic Panel: Recent Labs  Lab 09/23/20 0614 09/24/20 0542 09/25/20 0612  NA 137 137 138  K 4.0 4.8 3.6  CL 102 105 106  CO2 23 18* 21*  GLUCOSE 146* 194* 192*  BUN 27* 40* 51*  CREATININE 2.99* 3.71* 4.57*  CALCIUM 9.0 8.6* 8.8*  MG  --  2.1 2.1  PHOS  --  2.5 2.5    Liver Function Tests: Recent Labs  Lab 09/23/20 0614 09/24/20 0542 09/25/20 0612  AST 85*  --   --   ALT 37  --   --   ALKPHOS 317*  --   --   BILITOT 5.1*  --   --   PROT 7.1  --   --   ALBUMIN 1.2* 1.2* 1.1*   No results for input(s): LIPASE, AMYLASE in the last 168 hours. No results for input(s): AMMONIA in the last 168 hours.  CBC: Recent Labs  Lab 09/23/20 0614 09/24/20 0542 09/25/20 0612  WBC 17.7* 17.5* 17.2*  HGB 8.0* 7.7* 7.9*  HCT 26.3* 24.9* 24.8*  MCV 93.6 94.3 92.2  PLT 509* 538* 560*    Cardiac Enzymes: No results for input(s): CKTOTAL, CKMB, CKMBINDEX, TROPONINI in the last 168 hours.  BNP: Invalid input(s): POCBNP  CBG: No results for input(s): GLUCAP in the last 168 hours.  Microbiology: Results for orders placed  or performed during the hospital encounter of 09/22/20  Culture, respiratory (non-expectorated)     Status: None   Collection Time: 09/23/20 11:25 AM   Specimen: Tracheal Aspirate; Respiratory  Result Value Ref Range Status   Specimen Description TRACHEAL ASPIRATE  Final   Special Requests NONE  Final   Gram Stain   Final    RARE WBC PRESENT,BOTH PMN AND MONONUCLEAR NO ORGANISMS SEEN Performed at [facility], [address], [place], [place] [address]    Culture FEW KLEBSIELLA PNEUMONIAE  Final   Report Status 09/25/2020 FINAL  Final   Organism ID, Bacteria KLEBSIELLA PNEUMONIAE  Final      Susceptibility   Klebsiella pneumoniae - MIC*    AMPICILLIN >=32 RESISTANT Resistant     CEFAZOLIN <=4 SENSITIVE Sensitive     CEFEPIME 0.25 SENSITIVE Sensitive     CEFTAZIDIME <=1 SENSITIVE Sensitive     CEFTRIAXONE 1 SENSITIVE Sensitive     CIPROFLOXACIN <=0.25 SENSITIVE Sensitive     GENTAMICIN <=1 SENSITIVE Sensitive     IMIPENEM <=0.25 SENSITIVE Sensitive     TRIMETH/SULFA <=20 SENSITIVE Sensitive     AMPICILLIN/SULBACTAM 16 INTERMEDIATE Intermediate     PIP/TAZO 64 INTERMEDIATE Intermediate     * FEW KLEBSIELLA PNEUMONIAE  Culture, blood (routine  x 2)     Status: None (Preliminary result)   Collection Time: 09/23/20  4:26 PM   Specimen: BLOOD  Result Value Ref Range Status   Specimen Description BLOOD BLOOD RIGHT HAND  Final   Special Requests   Final    AEROBIC BOTTLE ONLY Blood Culture results may not be optimal due to an inadequate volume of blood received in culture bottles   Culture   Final    NO GROWTH 4 DAYS Performed at [facility], [place] [address] [place], [place] [address]    Report Status PENDING  Incomplete  Culture, blood (single)     Status: None (Preliminary result)   Collection Time: 09/24/20  6:00 AM   Specimen: BLOOD RIGHT HAND  Result Value Ref Range Status   Specimen Description BLOOD RIGHT HAND  Final   Special Requests   Final    BOTTLES  DRAWN AEROBIC ONLY Blood Culture results may not be optimal due to an inadequate volume of blood received in culture bottles   Culture   Final    NO GROWTH 3 DAYS Performed at [facility], [place] [address] [place], [place] [address]    Report Status PENDING  Incomplete    Coagulation Studies: No results for input(s): LABPROT, INR in the last 72 hours.  Urinalysis: No results for input(s): COLORURINE, LABSPEC, PHURINE, GLUCOSEU, HGBUR, BILIRUBINUR, KETONESUR, PROTEINUR, UROBILINOGEN, NITRITE, LEUKOCYTESUR in the last 72 hours.  Invalid input(s): APPERANCEUR    Imaging: DG Abd 1 View  Result Date: 09/26/2020 CLINICAL DATA:  Nausea and vomiting. EXAM: ABDOMEN - 1 VIEW COMPARISON:  Radiograph 09/22/2020. FINDINGS: Gastrostomy tube projects over the stomach. Gaseous gastric distension with surgical clips in the region of the gastric cardia. There are dilated loops of small bowel in the central abdomen measuring up to 4.2 cm. Catheter projects over the right upper quadrant. No obvious radiopaque calculi. IMPRESSION: 1. Dilated small bowel loops in the central abdomen may represent small bowel obstruction or ileus. 2. Gaseous gastric distension. Gastrostomy tube projects over the stomach. 3. Catheter in the right upper quadrant of unknown etiology. Recommend correlation with clinical history. Electronically Signed   By: Keith Rake M.D.   On: 09/26/2020 17:22   DG Abd Portable 1V  Result Date: 09/27/2020 CLINICAL DATA:  Nausea.  Ileus. EXAM: PORTABLE ABDOMEN - 1 VIEW COMPARISON:  09/26/2020 FINDINGS: 0610 hours. Gastrostomy tube again noted. Pigtail drain in the right upper quadrant remains in place. Diffuse gaseous distention of central small bowel loops persist measuring up to 4 cm diameter today. Although gastric bubble has decreased, the volume of small bowel gas appears slightly progressive in the interval. IMPRESSION: Interval slight increase in gaseous small bowel distension.  Electronically Signed   By: Misty Stanley M.D.   On: 09/27/2020 06:40     Medications:     iohexol  Assessment/ Plan:  54 y.o. 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   1.  ESRD on HD.  Patient seen and evaluated during hemodialysis treatment today.  Hypotension noted which we will discussed below.  Continue dialysis on MWF schedule.  2.  Hypotension.  Add  albumin 25 g IV q. HD treatment.  In addition midodrine dosage increased.  3.  Anemia of chronic kidney disease.  Continue Retacrit 10,000 IV with dialysis.  Hemoglobin down to 6.9.  Consider blood transfusion but defer to primary team.  4.  Secondary hyperparathyroidism.  Phosphorus 2.5 at last check and acceptable.  5.  Acute respiratory failure.  Weaning from the ventilator as per pulmonary/critical care.    LOS: 0 Esta Carmon 10/6/20218:17 AM

## 2020-09-27 NOTE — Progress Notes (Addendum)
Pulmonary Critical Care Medicine [place]   PULMONARY CRITICAL CARE SERVICE  PROGRESS NOTE  Date of Service: 09/27/2020  Jeremy Yu  PXT:[id number]  DOB: [birth year]   DOA: 09/22/2020  Referring Physician: Merton Border, MD  HPI: Jeremy Yu is a 54 y.o. male seen for follow up of Acute on Chronic Respiratory Failure.  Patient remains on pressure support currently requiring 28% FiO2 has a 12-hour goal today satting well no distress.  Medications: Reviewed on Rounds  Physical Exam:  Vitals: Pulse 92 respirations 26 BP 110/63 O2 sat 99% temp 98.0  Ventilator Settings pressure support 12/5 FiO2 28%  . General: Comfortable at this time . Eyes: Grossly normal lids, irises & conjunctiva . ENT: grossly tongue is normal . Neck: no obvious mass . Cardiovascular: S1 S2 normal no gallop . Respiratory: Coarse breath sounds . Abdomen: soft . Skin: no rash seen on limited exam . Musculoskeletal: not rigid . Psychiatric:unable to assess . Neurologic: no seizure no involuntary movements         Lab Data:   Basic Metabolic Panel: Recent Labs  Lab 09/23/20 0614 09/24/20 0542 09/25/20 0612 09/27/20 0500  NA 137 137 138 134*  K 4.0 4.8 3.6 3.0*  CL 102 105 106 100  CO2 23 18* 21* 20*  GLUCOSE 146* 194* 192* 186*  BUN 27* 40* 51* 34*  CREATININE 2.99* 3.71* 4.57* 3.67*  CALCIUM 9.0 8.6* 8.8* 8.5*  MG  --  2.1 2.1  --   PHOS  --  2.5 2.5 2.2*    ABG: Recent Labs  Lab 09/22/20 1845  PHART 7.397  PCO2ART 40.5  PO2ART 86.4  HCO3 24.5  O2SAT 96.8    Liver Function Tests: Recent Labs  Lab 09/23/20 0614 09/24/20 0542 09/25/20 0612 09/27/20 0500  AST 85*  --   --   --   ALT 37  --   --   --   ALKPHOS 317*  --   --   --   BILITOT 5.1*  --   --   --   PROT 7.1  --   --   --   ALBUMIN 1.2* 1.2* 1.1* 1.1*   No results for input(s): LIPASE, AMYLASE in the last 168 hours. No results for input(s): AMMONIA in the last 168  hours.  CBC: Recent Labs  Lab 09/23/20 0614 09/24/20 0542 09/25/20 0612 09/27/20 0500  WBC 17.7* 17.5* 17.2* 16.5*  HGB 8.0* 7.7* 7.9* 6.9*  HCT 26.3* 24.9* 24.8* 21.3*  MCV 93.6 94.3 92.2 89.9  PLT 509* 538* 560* 549*    Cardiac Enzymes: No results for input(s): CKTOTAL, CKMB, CKMBINDEX, TROPONINI in the last 168 hours.  BNP (last 3 results) No results for input(s): BNP in the last 8760 hours.  ProBNP (last 3 results) No results for input(s): PROBNP in the last 8760 hours.  Radiological Exams: DG Abd 1 View  Result Date: 09/26/2020 CLINICAL DATA:  Nausea and vomiting. EXAM: ABDOMEN - 1 VIEW COMPARISON:  Radiograph 09/22/2020. FINDINGS: Gastrostomy tube projects over the stomach. Gaseous gastric distension with surgical clips in the region of the gastric cardia. There are dilated loops of small bowel in the central abdomen measuring up to 4.2 cm. Catheter projects over the right upper quadrant. No obvious radiopaque calculi. IMPRESSION: 1. Dilated small bowel loops in the central abdomen may represent small bowel obstruction or ileus. 2. Gaseous gastric distension. Gastrostomy tube projects over the stomach. 3. Catheter in the right upper quadrant of unknown etiology. Recommend  correlation with clinical history. Electronically Signed   By: Keith Rake M.D.   On: 09/26/2020 17:22   DG Abd Portable 1V  Result Date: 09/27/2020 CLINICAL DATA:  Nausea.  Ileus. EXAM: PORTABLE ABDOMEN - 1 VIEW COMPARISON:  09/26/2020 FINDINGS: 0610 hours. Gastrostomy tube again noted. Pigtail drain in the right upper quadrant remains in place. Diffuse gaseous distention of central small bowel loops persist measuring up to 4 cm diameter today. Although gastric bubble has decreased, the volume of small bowel gas appears slightly progressive in the interval. IMPRESSION: Interval slight increase in gaseous small bowel distension. Electronically Signed   By: Misty Stanley M.D.   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 Acute on chronic respiratory failure with hypoxia p patient will continue for 12-hour goal on pressure support 12/5 and FiO2 of 28% once he is completed dialysis today.  We will continue supportive measures and pulmonary toilet. 2. End-stage renal failure on hemodialysis nephrology consultation 3. Severe sepsis with shock 4. Cardiac arrest rhythm stable we will monitor on telemetry 5. Metabolic encephalopathy unchanged   I have personally seen and evaluated the patient, evaluated laboratory and imaging results, formulated the assessment and plan and placed orders. The Patient requires high complexity decision making with multiple systems involvement.  Rounds were done with the Respiratory Therapy Director and Staff therapists and discussed with nursing staff also.  Allyne Gee, MD [facility] Pulmonary Critical Care Medicine Sleep Medicine

## 2020-09-28 ENCOUNTER — Other Ambulatory Visit (HOSPITAL_COMMUNITY): Payer: Medicare Other

## 2020-09-28 DIAGNOSIS — G9341 Metabolic encephalopathy: Secondary | ICD-10-CM | POA: Diagnosis not present

## 2020-09-28 DIAGNOSIS — N186 End stage renal disease: Secondary | ICD-10-CM | POA: Diagnosis not present

## 2020-09-28 DIAGNOSIS — I469 Cardiac arrest, cause unspecified: Secondary | ICD-10-CM | POA: Diagnosis not present

## 2020-09-28 DIAGNOSIS — J9621 Acute and chronic respiratory failure with hypoxia: Secondary | ICD-10-CM | POA: Diagnosis not present

## 2020-09-28 LAB — BASIC METABOLIC PANEL
Anion gap: 11 (ref 5–15)
BUN: 17 mg/dL (ref 6–20)
CO2: 21 mmol/L — ABNORMAL LOW (ref 22–32)
Calcium: 8.3 mg/dL — ABNORMAL LOW (ref 8.9–10.3)
Chloride: 99 mmol/L (ref 98–111)
Creatinine, Ser: 2.74 mg/dL — ABNORMAL HIGH (ref 0.61–1.24)
GFR calc non Af Amer: 25 mL/min — ABNORMAL LOW (ref >60)
Glucose, Bld: 150 mg/dL — ABNORMAL HIGH (ref 70–99)
Potassium: 5.8 mmol/L — ABNORMAL HIGH (ref 3.5–5.1)
Sodium: 131 mmol/L — ABNORMAL LOW (ref 135–145)

## 2020-09-28 LAB — BPAM RBC
Blood Product Expiration Date: 202111012359
ISSUE DATE / TIME: 202110061733
Unit Type and Rh: 5100

## 2020-09-28 LAB — CBC
HCT: 26.3 % — ABNORMAL LOW (ref 39.0–52.0)
Hemoglobin: 8.1 g/dL — ABNORMAL LOW (ref 13.0–17.0)
MCH: 27.9 pg (ref 26.0–34.0)
MCHC: 30.8 g/dL (ref 30.0–36.0)
MCV: 90.7 fL (ref 80.0–100.0)
Platelets: 490 10*3/uL — ABNORMAL HIGH (ref 150–400)
RBC: 2.9 MIL/uL — ABNORMAL LOW (ref 4.22–5.81)
RDW: 23.1 % — ABNORMAL HIGH (ref 11.5–15.5)
WBC: 14.4 10*3/uL — ABNORMAL HIGH (ref 4.0–10.5)
nRBC: 0.7 % — ABNORMAL HIGH (ref 0.0–0.2)

## 2020-09-28 LAB — TYPE AND SCREEN
ABO/RH(D): O POS
Antibody Screen: NEGATIVE
Unit division: 0

## 2020-09-28 LAB — CULTURE, BLOOD (ROUTINE X 2): Culture: NO GROWTH

## 2020-09-28 LAB — OCCULT BLOOD X 1 CARD TO LAB, STOOL: Fecal Occult Bld: NEGATIVE

## 2020-09-28 LAB — MAGNESIUM: Magnesium: 1.7 mg/dL (ref 1.7–2.4)

## 2020-09-28 NOTE — Progress Notes (Signed)
Pulmonary Critical Care Medicine [place]   PULMONARY CRITICAL CARE SERVICE  PROGRESS NOTE  Date of Service: 09/28/2020  Jeremy Yu  GEX:[id number]  DOB: 1966/06/21   DOA: 09/22/2020  Referring Physician: Merton Border, MD  HPI: Jeremy Yu is a 54 y.o. male seen for follow up of Acute on Chronic Respiratory Failure.  Patient is pressure support mode currently on 20% FiO2 has been on wean protocol: 16-hour wean  Medications: Reviewed on Rounds  Physical Exam:  Vitals: Temperature is 97.6 pulse 87 respiratory rate 24 blood pressure is 119/65 saturations 100%  Ventilator Settings on pressure support FiO2 28% pressure 12/5  . General: Comfortable at this time . Eyes: Grossly normal lids, irises & conjunctiva . ENT: grossly tongue is normal . Neck: no obvious mass . Cardiovascular: S1 S2 normal no gallop . Respiratory: No rhonchi very coarse breath sounds . Abdomen: soft . Skin: no rash seen on limited exam . Musculoskeletal: not rigid . Psychiatric:unable to assess . Neurologic: no seizure no involuntary movements         Lab Data:   Basic Metabolic Panel: Recent Labs  Lab 09/23/20 0614 09/23/20 0614 09/24/20 0542 09/25/20 0612 09/27/20 0500 09/27/20 1415 09/28/20 0632  NA 137  --  137 138 134*  --  131*  K 4.0   < > 4.8 3.6 3.0* 2.5* 5.8*  CL 102  --  105 106 100  --  99  CO2 23  --  18* 21* 20*  --  21*  GLUCOSE 146*  --  194* 192* 186*  --  150*  BUN 27*  --  40* 51* 34*  --  17  CREATININE 2.99*  --  3.71* 4.57* 3.67*  --  2.74*  CALCIUM 9.0  --  8.6* 8.8* 8.5*  --  8.3*  MG  --   --  2.1 2.1 2.0  --  1.7  PHOS  --   --  2.5 2.5 2.2*  --   --    < > = values in this interval not displayed.    ABG: Recent Labs  Lab 09/22/20 1845  PHART 7.397  PCO2ART 40.5  PO2ART 86.4  HCO3 24.5  O2SAT 96.8    Liver Function Tests: Recent Labs  Lab 09/23/20 0614 09/24/20 0542 09/25/20 0612 09/27/20 0500  AST 85*  --   --   --    ALT 37  --   --   --   ALKPHOS 317*  --   --   --   BILITOT 5.1*  --   --   --   PROT 7.1  --   --   --   ALBUMIN 1.2* 1.2* 1.1* 1.1*   No results for input(s): LIPASE, AMYLASE in the last 168 hours. No results for input(s): AMMONIA in the last 168 hours.  CBC: Recent Labs  Lab 09/23/20 0614 09/24/20 0542 09/25/20 0612 09/27/20 0500 09/28/20 0632  WBC 17.7* 17.5* 17.2* 16.5* 14.4*  HGB 8.0* 7.7* 7.9* 6.9* 8.1*  HCT 26.3* 24.9* 24.8* 21.3* 26.3*  MCV 93.6 94.3 92.2 89.9 90.7  PLT 509* 538* 560* 549* 490*    Cardiac Enzymes: No results for input(s): CKTOTAL, CKMB, CKMBINDEX, TROPONINI in the last 168 hours.  BNP (last 3 results) No results for input(s): BNP in the last 8760 hours.  ProBNP (last 3 results) No results for input(s): PROBNP in the last 8760 hours.  Radiological Exams: CT ABDOMEN PELVIS WO CONTRAST  Result Date: 09/28/2020  CLINICAL DATA:  Abdominal pain with bowel obstruction suspected. Subtotal colectomy and PEG at outside hospital in September. Per floor notes there is leakage from the PEG site. EXAM: CT ABDOMEN AND PELVIS WITHOUT CONTRAST TECHNIQUE: Multidetector CT imaging of the abdomen and pelvis was performed following the standard protocol without IV contrast. COMPARISON:  None. FINDINGS: Lower chest: Volume loss and opacity at the right base. Rounded airspace opacity at the lingula. Hepatobiliary: No focal liver abnormality.Percutaneous cholecystostomy tube. Cholelithiasis. The gallbladder is decompressed. No focal right upper quadrant inflammation. Pancreas: Unremarkable. Spleen: Unremarkable. Adrenals/Urinary Tract: Negative adrenals. End-stage renal disease with symmetric renal atrophy. No hydronephrosis. Unremarkable bladder. Stomach/Bowel: Subtotal colectomy with right lower quadrant loop ileostomy and Hartmann's pouch appearance. Peg tube in position with at least 1 T tack still visible. No obstructive pattern. Vascular/Lymphatic: Arterial  calcifications.  No mass or adenopathy. Reproductive:Negative Other: Large volume ascites with suggestion of dependent debris or peritoneal thickening. In left lower quadrant ascites measures up to 13 cm in thickness. Strandy fat in the left inguinal canal, per report there was recent inguinal hernia repair. Moderate pneumoperitoneum mainly in the ventral abdomen. Musculoskeletal: No acute abnormalities. These results were called by telephone at the time of interpretation on 09/28/2020 at 4:32 am to provider Dr Ricard Dillon , who verbally acknowledged these results. Case also discussed with charge nurse [name] and the patient is clinically stable; this was supposed to be a routine CT order. IMPRESSION: 1. Located PEG tube; skin leakage may be related to large volume ascites. The ascites is complex with dependent debris or peritoneal thickening. Pneumoperitoneum is also seen in the ventral abdomen and abscess or intra-abdominal leakage are both possible. Recommend further PEG inspection with contrast injection. Paracentesis or enhanced abdominal CT may also be helpful. 2. Multi segment atelectasis at the right base. There is a rounded airspace opacity at the lingula which is likely pneumonia. 3. Percutaneous cholecystostomy tube in place with decompressed gallbladder. There is cholelithiasis. Electronically Signed   By: Monte Fantasia M.D.   On: 09/28/2020 04:37   DG Abd 1 View  Result Date: 09/26/2020 CLINICAL DATA:  Nausea and vomiting. EXAM: ABDOMEN - 1 VIEW COMPARISON:  Radiograph 09/22/2020. FINDINGS: Gastrostomy tube projects over the stomach. Gaseous gastric distension with surgical clips in the region of the gastric cardia. There are dilated loops of small bowel in the central abdomen measuring up to 4.2 cm. Catheter projects over the right upper quadrant. No obvious radiopaque calculi. IMPRESSION: 1. Dilated small bowel loops in the central abdomen may represent small bowel obstruction or ileus. 2. Gaseous  gastric distension. Gastrostomy tube projects over the stomach. 3. Catheter in the right upper quadrant of unknown etiology. Recommend correlation with clinical history. Electronically Signed   By: Keith Rake M.D.   On: 09/26/2020 17:22   DG CHEST PORT 1 VIEW  Result Date: 09/28/2020 CLINICAL DATA:  History of pneumonia. EXAM: PORTABLE CHEST 1 VIEW COMPARISON:  09/23/2020.  11/20/2016. FINDINGS: Tracheostomy tube in stable position. Heart size normal. Dense right base atelectasis again noted. Small right pleural effusion again noted. Mild infiltrate left lung base cannot be excluded. IMPRESSION: 1. Tracheostomy tube in stable position. 2. Dense right base atelectasis again noted. Small right pleural effusion again noted. Mild infiltrate left lung base cannot be excluded. Similar findings noted on prior exam. Electronically Signed   By: [place]   On: 09/28/2020 07:00   DG Abd Portable 1V  Result Date: 09/27/2020 CLINICAL DATA:  Nausea.  Ileus. EXAM: PORTABLE ABDOMEN -  1 VIEW COMPARISON:  09/26/2020 FINDINGS: 0610 hours. Gastrostomy tube again noted. Pigtail drain in the right upper quadrant remains in place. Diffuse gaseous distention of central small bowel loops persist measuring up to 4 cm diameter today. Although gastric bubble has decreased, the volume of small bowel gas appears slightly progressive in the interval. IMPRESSION: Interval slight increase in gaseous small bowel distension. Electronically Signed   By: Misty Stanley M.D.   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 Acute on chronic respiratory failure with hypoxia we will continue with pressure support mode with a goal of 16 hours. 2. End-stage renal failure on hemodialysis we will continue to follow  along 3. Severe sepsis with shock hemodynamics are stable. 4. Cardiac arrest rhythm is stable 5. Metabolic encephalopathy no change we will continue to follow   I have personally seen and evaluated the patient, evaluated laboratory and imaging results, formulated the assessment and plan and placed orders. The Patient requires high complexity decision making with multiple systems involvement.  Rounds were done with the Respiratory Therapy Director and Staff therapists and discussed with nursing staff also.  Allyne Gee, MD [facility] Pulmonary Critical Care Medicine Sleep Medicine

## 2020-09-28 NOTE — Consult Note (Signed)
Infectious disease consultation   Jeremy Yu  IOM:[id number]  DOB: Apr 24, 1966  DOA: 09/22/2020  Requesting physician: Dr. Laren Everts  Reason for consultation: Antibiotic recommendations   History of Present Illness: Jeremy Yu is an 54 y.o. male with medical history significant of end-stage renal disease on hemodialysis, diabetic nephropathy, chronic systolic and diastolic congestive heart failure, peripheral arterial disease, complete heart block, seizures, hyperlipidemia, hypertension, prior MI, GERD who had recent hospitalization from 07/17/2020 until 07/25/2020 for necrotizing soft tissue infection, sepsis.  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  He was discharged on 07/25/2020.  Patient apparently was at [facility] and rehab facility.  While undergoing dialysis on 08/20/2019 when he developed chills, rigors.  Blood cultures were taken and he was admitted to [facility] on 08/19/2020.  He was given IV vancomycin, ceftriaxone for probable sepsis.  He was found to have diarrhea and stool for C. difficile was positive.  Patient was placed on oral vancomycin.  CT on 08/20/2020 confirmed pancolitis with large left inguinal hernia, airspace disease with concern for pneumonia.  On 08/22/2019 when he became hypotensive, both IV and p.o. Flagyl was added to the p.o. vancomycin.  Infectious disease was consulted with addition of Dificid.  However, patient continued to worsen.  On 08/24/2019 when he was transferred urgently to the ICU and intubated for airway protection because he was becoming encephalopathic.  He was also given vasopressors for shock.  Fecal transplant was attempted by GI at the bedside however, inability to pass the scope due to large nonreducible inguinal hernia.  Surgery was  consulted and patient was taken emergently for subtotal colectomy and ileostomy.  Eravacycline was added for C. difficile/toxic megacolon.  He was seen by surgery with clearance for tube feeds on 08/25/2020.  Patient postoperatively remained encephalopathic.  Gradually mental status improved.  However, he had significant drop in platelet count on 08/27/2020.  Hematology was consulted and they thought the thrombocytopenia was likely secondary to sepsis, recent surgery, critical illness, DIC.  He was given platelet transfusion with additional PRBC due to oozing from the surgical site, drop in hemoglobin levels.  He was also given vitamin K for ongoing liver dysfunction.  He apparently received total of 8 units platelets.  Repeat CT was done which showed left lingular consolidation, bilateral effusions.  Abdomen showed postop free fluid, gallstones.  He had progressive thrombocytopenia, abnormal LFTs therefore Flagyl, Eravacycline was stopped.  He had blood cultures on 08/28/2020 that showed staph.  He was started on IV vancomycin, Zosyn.  He was also treated with micafungin.  On 09/04/2020 patient had PEA arrest during which she was intubated, subsequently underwent EGD with evidence of bleeding ulcer, gastritis.  He continued to have oozing from his ileostomy and was receiving blood products.  He was placed on PPI drip.  Right upper quadrant ultrasound showed possible cholecystitis.  He was to undergo drain placement but underwent HIDA on 09/09/2020, gallbladder was not visualized.  On 09/11/2019 when he had large amount of blood from the stoma and received PRBC.  He had EGD with bleeding ulcer that was clipped.  On 09/12/2019 when he had paracentesis with 5.2 L removed.  Patient had fluid cultures grew VRE for which he was treated with daptomycin for 14 days.  He failed ventilator weaning efforts therefore tracheostomy was  done on 09/14/2020.  He has unstageable sacral pressure ulcer for which he received wound care. Due to  his complex medical problems he was transferred and admitted to [facility] on 09/22/2020. -He has trach, on vent, on 28% FiO2, 5 of PEEP.  After admission here he started having increasing secretions.  Respiratory cultures showed Klebsiella.  He has been started on ciprofloxacin, Flagyl.   Review of Systems:  Patient nonverbal but nods his head when asked questions.  As per HPI otherwise review of systems negative.    Past Medical History: Past Medical History:  Diagnosis Date  . Acute on chronic respiratory failure with hypoxia ([place])   . Anxiety   . Asthma   . Blind in both eyes   . Cardiac arrest ([place])   . Chronic bronchitis ([place])   . Chronic renal failure    Sees [facility]/notes 05/14/2016  . Daily headache   . Depression   . Diabetic retinopathy ([place])    Ar[facility] Endo 11/02/2015  . DVT (deep venous thrombosis) ([place])    "? side" (10/03/2016)  . End stage renal failure on dialysis ([place])   . Falls frequently    "last fall was yesterday" (10/03/2016)  . Fibromyalgia   . GERD (gastroesophageal reflux disease)   . History of blood transfusion   . History of stomach ulcers   . Hyperlipidemia   . Hypertension   . Metabolic encephalopathy   . Migraine    "weekly" (10/03/2016)  . Nephropathy    Ar[facility] Endo 11/02/2015  . Peripheral neuropathy    Ar[facility] Endo 11/02/2015  . Seizures ([place])    "light; none in the last 2 months; probably have 3/year" (10/03/2016)  . Severe sepsis with septic shock (CODE) ([place])   . Symptomatic anemia 10/03/2016  . Type I diabetes mellitus ([place]) dx'd ~ 1987   [facility] Endo 11/02/2015    Past Surgical History: Past Surgical History:  Procedure Laterality Date  . COLONOSCOPY N/A 10/06/2016   Procedure: COLONOSCOPY;  Surgeon: [name], MD;  Location: [facility];  Service: Endoscopy;  Laterality: N/A;  . COLONOSCOPY N/A 10/07/2016   Procedure: COLONOSCOPY;  Surgeon: Manus Gunning, MD;  Location: [facility];  Service:  Gastroenterology;  Laterality: N/A;  would be ok with moderate I think  . ESOPHAGOGASTRODUODENOSCOPY N/A 10/05/2016   Procedure: ESOPHAGOGASTRODUODENOSCOPY (EGD);  Surgeon: [name], MD;  Location[facility] ENDOSCOPY;  Service: Endoscopy;  Laterality: N/A;  . NO PAST SURGERIES      Allergies:   Allergies  Allergen Reactions  . Penicillins Shortness Of Breath and Itching    Has patient had a PCN reaction causing immediate rash, facial/tongue/throat swelling, SOB or lightheadedness with hypotension:YES Has patient had a PCN reaction causing severe rash involving mucus membranes or skin necrosis: NO Has patient had a PCN reaction that required hospitalization NO Has patient had a PCN reaction occurring within the last 10 years: NO If all of the above answers are "NO", then may proceed with Cephalosporin use.      Social History:  reports that he has quit smoking. His smoking use included cigarettes. He has never used smokeless tobacco. He reports current drug use. Drug: Marijuana. He reports that he does not drink alcohol.   Family History: Family History  Problem Relation Age of Onset  . Diabetes Mother   . Hypertension Mother   . Heart failure Mother   . Blindness Mother   . GER disease Mother   . Hypertension Father   . Cancer Father   .  Arthritis Father    Physical Exam: Vitals: Temperature 97.6, pulse 94, respiratory rate 24, blood pressure 110/78, pulse oximetry 93% Constitutional: Chronically ill-appearing male, awake Head: Atraumatic, normocephalic Eyes: pupils equal and reactive, legally blind  ENMT: external ears and nose appear normal, normal hearing, Lips appears normal, moist oral mucosa Neck: Has trach in place CVS: S1-S2  Respiratory: Rhonchi, no wheezing Abdomen: Distended, dressing in place, right-sided drain, ostomy, PEG tube, diminished bowel sounds Musculoskeletal: Right lower extremity AKA Neuro: Legally blind, has debility with generalized  weakness Psych: stable mood Skin: no rashes  Data reviewed:  I have personally reviewed following labs and imaging studies Labs:  CBC: Recent Labs  Lab 09/23/20 0614 09/24/20 0542 09/25/20 0612 09/27/20 0500 09/28/20 0632  WBC 17.7* 17.5* 17.2* 16.5* 14.4*  HGB 8.0* 7.7* 7.9* 6.9* 8.1*  HCT 26.3* 24.9* 24.8* 21.3* 26.3*  MCV 93.6 94.3 92.2 89.9 90.7  PLT 509* 538* 560* 549* 490*    Basic Metabolic Panel: Recent Labs  Lab 09/23/20 0614 09/23/20 0614 09/24/20 0542 09/24/20 0542 09/25/20 0612 09/25/20 0612 09/27/20 0500 09/27/20 0500 09/27/20 1415 09/28/20 0632  NA 137  --  137  --  138  --  134*  --   --  131*  K 4.0   < > 4.8   < > 3.6   < > 3.0*   < > 2.5* 5.8*  CL 102  --  105  --  106  --  100  --   --  99  CO2 23  --  18*  --  21*  --  20*  --   --  21*  GLUCOSE 146*  --  194*  --  192*  --  186*  --   --  150*  BUN 27*  --  40*  --  51*  --  34*  --   --  17  CREATININE 2.99*  --  3.71*  --  4.57*  --  3.67*  --   --  2.74*  CALCIUM 9.0  --  8.6*  --  8.8*  --  8.5*  --   --  8.3*  MG  --   --  2.1  --  2.1  --  2.0  --   --  1.7  PHOS  --   --  2.5  --  2.5  --  2.2*  --   --   --    < > = values in this interval not displayed.   GFR CrCl cannot be calculated (Unknown ideal weight.). Liver Function Tests: Recent Labs  Lab 09/23/20 0614 09/24/20 0542 09/25/20 0612 09/27/20 0500  AST 85*  --   --   --   ALT 37  --   --   --   ALKPHOS 317*  --   --   --   BILITOT 5.1*  --   --   --   PROT 7.1  --   --   --   ALBUMIN 1.2* 1.2* 1.1* 1.1*   No results for input(s): LIPASE, AMYLASE in the last 168 hours. No results for input(s): AMMONIA in the last 168 hours. Coagulation profile No results for input(s): INR, PROTIME in the last 168 hours.  Cardiac Enzymes: No results for input(s): CKTOTAL, CKMB, CKMBINDEX, TROPONINI in the last 168 hours. BNP: Invalid input(s): POCBNP CBG: No results for input(s): GLUCAP in the last 168 hours. D-Dimer No results  for input(s): DDIMER in the last 72  hours. Hgb A1c No results for input(s): HGBA1C in the last 72 hours. Lipid Profile No results for input(s): CHOL, HDL, LDLCALC, TRIG, CHOLHDL, LDLDIRECT in the last 72 hours. Thyroid function studies No results for input(s): TSH, T4TOTAL, T3FREE, THYROIDAB in the last 72 hours.  Invalid input(s): FREET3 Anemia work up No results for input(s): VITAMINB12, FOLATE, FERRITIN, TIBC, IRON, RETICCTPCT in the last 72 hours. Urinalysis    Component Value Date/Time   COLORURINE YELLOW 11/21/2016 1936   APPEARANCEUR CLEAR 11/21/2016 1936   LABSPEC 1.016 11/21/2016 1936   PHURINE 6.0 11/21/2016 1936   GLUCOSEU 250 (A) 11/21/2016 1936   HGBUR MODERATE (A) 11/21/2016 [place] NEGATIVE 11/21/2016 1936   KETONESUR NEGATIVE 11/21/2016 1936   PROTEINUR >300 (A) 11/21/2016 1936   UROBILINOGEN 0.2 03/01/2015 2250   NITRITE NEGATIVE 11/21/2016 1936   LEUKOCYTESUR NEGATIVE 11/21/2016 1936   Microbiology Recent Results (from the past 240 hour(s))  Culture, respiratory (non-expectorated)     Status: None   Collection Time: 09/23/20 11:25 AM   Specimen: Tracheal Aspirate; Respiratory  Result Value Ref Range Status   Specimen Description TRACHEAL ASPIRATE  Final   Special Requests NONE  Final   Gram Stain   Final    RARE WBC PRESENT,BOTH PMN AND MONONUCLEAR NO ORGANISMS SEEN Performed at [facility], [address] [place], [place] [address]    Culture FEW KLEBSIELLA PNEUMONIAE  Final   Report Status 09/25/2020 FINAL  Final   Organism ID, Bacteria KLEBSIELLA PNEUMONIAE  Final      Susceptibility   Klebsiella pneumoniae - MIC*    AMPICILLIN >=32 RESISTANT Resistant     CEFAZOLIN <=4 SENSITIVE Sensitive     CEFEPIME 0.25 SENSITIVE Sensitive     CEFTAZIDIME <=1 SENSITIVE Sensitive     CEFTRIAXONE 1 SENSITIVE Sensitive     CIPROFLOXACIN <=0.25 SENSITIVE Sensitive     GENTAMICIN <=1 SENSITIVE Sensitive     IMIPENEM <=0.25 SENSITIVE Sensitive      TRIMETH/SULFA <=20 SENSITIVE Sensitive     AMPICILLIN/SULBACTAM 16 INTERMEDIATE Intermediate     PIP/TAZO 64 INTERMEDIATE Intermediate     * FEW KLEBSIELLA PNEUMONIAE  Culture, blood (routine x 2)     Status: None   Collection Time: 09/23/20  4:26 PM   Specimen: BLOOD  Result Value Ref Range Status   Specimen Description BLOOD BLOOD RIGHT HAND  Final   Special Requests   Final    AEROBIC BOTTLE ONLY Blood Culture results may not be optimal due to an inadequate volume of blood received in culture bottles   Culture   Final    NO GROWTH 5 DAYS Performed at [facility], [address], [place], [place] [address]    Report Status 09/28/2020 FINAL  Final  Culture, blood (single)     Status: None (Preliminary result)   Collection Time: 09/24/20  6:00 AM   Specimen: BLOOD RIGHT HAND  Result Value Ref Range Status   Specimen Description BLOOD RIGHT HAND  Final   Special Requests   Final    BOTTLES DRAWN AEROBIC ONLY Blood Culture results may not be optimal due to an inadequate volume of blood received in culture bottles   Culture   Final    NO GROWTH 4 DAYS Performed at [facility], [place] [address] [place],  [address]    Report Status PENDING  Incomplete    Inpatient Medications:   Please see MAR  Radiological Exams on Admission: CT ABDOMEN  PELVIS WO CONTRAST  Result Date: 09/28/2020 CLINICAL DATA:  Abdominal pain with bowel obstruction suspected. Subtotal colectomy and PEG at outside hospital in September. Per floor notes there is leakage from the PEG site. EXAM: CT ABDOMEN AND PELVIS WITHOUT CONTRAST TECHNIQUE: Multidetector CT imaging of the abdomen and pelvis was performed following the standard protocol without IV contrast. COMPARISON:  None. FINDINGS: Lower chest: Volume loss and opacity at the right base. Rounded airspace opacity at the lingula. Hepatobiliary: No focal liver abnormality.Percutaneous cholecystostomy tube. Cholelithiasis. The gallbladder is  decompressed. No focal right upper quadrant inflammation. Pancreas: Unremarkable. Spleen: Unremarkable. Adrenals/Urinary Tract: Negative adrenals. End-stage renal disease with symmetric renal atrophy. No hydronephrosis. Unremarkable bladder. Stomach/Bowel: Subtotal colectomy with right lower quadrant loop ileostomy and Hartmann's pouch appearance. Peg tube in position with at least 1 T tack still visible. No obstructive pattern. Vascular/Lymphatic: Arterial calcifications.  No mass or adenopathy. Reproductive:Negative Other: Large volume ascites with suggestion of dependent debris or peritoneal thickening. In left lower quadrant ascites measures up to 13 cm in thickness. Strandy fat in the left inguinal canal, per report there was recent inguinal hernia repair. Moderate pneumoperitoneum mainly in the ventral abdomen. Musculoskeletal: No acute abnormalities. These results were called by telephone at the time of interpretation on 09/28/2020 at 4:32 am to provider Dr Ricard Dillon , who verbally acknowledged these results. Case also discussed with charge nurse [name] and the patient is clinically stable; this was supposed to be a routine CT order. IMPRESSION: 1. Located PEG tube; skin leakage may be related to large volume ascites. The ascites is complex with dependent debris or peritoneal thickening. Pneumoperitoneum is also seen in the ventral abdomen and abscess or intra-abdominal leakage are both possible. Recommend further PEG inspection with contrast injection. Paracentesis or enhanced abdominal CT may also be helpful. 2. Multi segment atelectasis at the right base. There is a rounded airspace opacity at the lingula which is likely pneumonia. 3. Percutaneous cholecystostomy tube in place with decompressed gallbladder. There is cholelithiasis. Electronically Signed   By: Monte Fantasia M.D.   On: 09/28/2020 04:37   DG Chest Port 1 View  Result Date: 09/28/2020 CLINICAL DATA:  Line placement EXAM: PORTABLE CHEST 1  VIEW COMPARISON:  Earlier same day FINDINGS: New left IJ central line tip overlies the superior right atrium. Tracheostomy device is present. Density at the right lung base with elevation of the diaphragm. Additional density at the left lung base. Appearance is similar. Stable cardiomediastinal contours. No pneumothorax. IMPRESSION: Left IJ line tip overlies superior right atrium.  No pneumothorax. Similar lung aeration with bibasilar atelectasis/consolidation. Electronically Signed   By: Macy Mis M.D.   On: 09/28/2020 12:12   DG CHEST PORT 1 VIEW  Result Date: 09/28/2020 CLINICAL DATA:  History of pneumonia. EXAM: PORTABLE CHEST 1 VIEW COMPARISON:  09/23/2020.  11/20/2016. FINDINGS: Tracheostomy tube in stable position. Heart size normal. Dense right base atelectasis again noted. Small right pleural effusion again noted. Mild infiltrate left lung base cannot be excluded. IMPRESSION: 1. Tracheostomy tube in stable position. 2. Dense right base atelectasis again noted. Small right pleural effusion again noted. Mild infiltrate left lung base cannot be excluded. Similar findings noted on prior exam. Electronically Signed   By: [place]   On: 09/28/2020 07:00   DG Abd Portable 1V  Result Date: 09/27/2020 CLINICAL DATA:  Nausea.  Ileus. EXAM: PORTABLE ABDOMEN - 1 VIEW COMPARISON:  09/26/2020 FINDINGS: 0610 hours. Gastrostomy tube again noted. Pigtail drain in the right upper quadrant remains  in place. Diffuse gaseous distention of central small bowel loops persist measuring up to 4 cm diameter today. Although gastric bubble has decreased, the volume of small bowel gas appears slightly progressive in the interval. IMPRESSION: Interval slight increase in gaseous small bowel distension. Electronically Signed   By: Misty Stanley M.D.   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 Ascites Ileus  End stage renal failure on dialysis  Necrotizing fasciitis status post right AKA Diabetes mellitus GIB with bleeding ulcer/gastritis Thrombocytopenia Anemia Status post PEA arrest    Acute on chronic respiratory failure with hypoxemia/hypercapnia, ventilator dependent: Likely multifactorial etiology.  He had severe sepsis with septic shock.  He also had PEA arrest at the outside facility and had to be intubated.  He has pneumonia. Respiratory cultures from here showing Klebsiella. Currently on treatment with ciprofloxacin, metronidazole. Pulmonary following.  Severe sepsis with shock: Patient was on pressors at the outside facility.  Currently shock resolved.  He is having pneumonia with respiratory cultures that are showing Klebsiella.  Currently on ciprofloxacin, Flagyl.  Continue current antibiotics.  If he starts having fevers would recommend to send for pan cultures.  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  Currently has postoperative wound in place.  Continue local wound care.  Antibiotics as mentioned above.  Peritonitis: Patient had peritonitis at the acute facility with peritoneal fluid cultures that showed VRE.  He status post treatment with 14 days of daptomycin.  Currently has ileus.  CT of the abdomen and pelvis from here showing large volume ascites.  There was also mention of pneumoperitoneum in the ventral abdomen/abscess per primary team plan for paracentesis.  Please send fluid cultures and treat accordingly.  Currently on Cipro and Flagyl.  Cholecystitis: He also had cholecystitis and is status post drain placement.  Antibiotics as mentioned above.  End-stage renal disease on  dialysis: Dialysis per nephrology.  Antibiotics renally dosed.  Necrotizing fasciitis status post right AKA: Continue local wound care.  Diabetes mellitus: Continue to monitor Accu-Cheks, medications and management of diabetes per the primary team.  He will need proper glycemic control in order to enable healing.  Bleeding ulcer/gastritis: Status post EGD and clipping at the acute facility.  On PPI.  Continue to monitor.  Further management per primary team.  Thrombocytopenia: Patient had severe thrombocytopenia at the outside facility and received platelet transfusion.  Continue to monitor platelets closely.  Anemia: He got PRBCs yesterday. Continue to monitor hemoglobin.  Further management per the primary team.  Thank you for this consultation.  Unfortunately due to his complex medical problems he is high risk for worsening and decompensation.  Plan of care discussed with the patient, primary team and pharmacy.  Yaakov Guthrie M.D. 09/28/2020, 4:57 PM

## 2020-09-29 ENCOUNTER — Other Ambulatory Visit (HOSPITAL_COMMUNITY): Payer: Medicare Other

## 2020-09-29 DIAGNOSIS — G9341 Metabolic encephalopathy: Secondary | ICD-10-CM | POA: Diagnosis not present

## 2020-09-29 DIAGNOSIS — I469 Cardiac arrest, cause unspecified: Secondary | ICD-10-CM | POA: Diagnosis not present

## 2020-09-29 DIAGNOSIS — N186 End stage renal disease: Secondary | ICD-10-CM | POA: Diagnosis not present

## 2020-09-29 DIAGNOSIS — J9621 Acute and chronic respiratory failure with hypoxia: Secondary | ICD-10-CM | POA: Diagnosis not present

## 2020-09-29 HISTORY — PX: IR PARACENTESIS: IMG2679

## 2020-09-29 LAB — PROTEIN, PLEURAL OR PERITONEAL FLUID: Total protein, fluid: 4 g/dL

## 2020-09-29 LAB — CBC
HCT: 25.8 % — ABNORMAL LOW (ref 39.0–52.0)
Hemoglobin: 8.3 g/dL — ABNORMAL LOW (ref 13.0–17.0)
MCH: 28.9 pg (ref 26.0–34.0)
MCHC: 32.2 g/dL (ref 30.0–36.0)
MCV: 89.9 fL (ref 80.0–100.0)
Platelets: 578 10*3/uL — ABNORMAL HIGH (ref 150–400)
RBC: 2.87 MIL/uL — ABNORMAL LOW (ref 4.22–5.81)
RDW: 22.8 % — ABNORMAL HIGH (ref 11.5–15.5)
WBC: 15.3 10*3/uL — ABNORMAL HIGH (ref 4.0–10.5)
nRBC: 0.3 % — ABNORMAL HIGH (ref 0.0–0.2)

## 2020-09-29 LAB — RENAL FUNCTION PANEL
Albumin: 1.2 g/dL — ABNORMAL LOW (ref 3.5–5.0)
Anion gap: 11 (ref 5–15)
BUN: 27 mg/dL — ABNORMAL HIGH (ref 6–20)
CO2: 22 mmol/L (ref 22–32)
Calcium: 8.5 mg/dL — ABNORMAL LOW (ref 8.9–10.3)
Chloride: 100 mmol/L (ref 98–111)
Creatinine, Ser: 3.6 mg/dL — ABNORMAL HIGH (ref 0.61–1.24)
GFR calc non Af Amer: 18 mL/min — ABNORMAL LOW (ref >60)
Glucose, Bld: 137 mg/dL — ABNORMAL HIGH (ref 70–99)
Phosphorus: 3.1 mg/dL (ref 2.5–4.6)
Potassium: 3.2 mmol/L — ABNORMAL LOW (ref 3.5–5.1)
Sodium: 133 mmol/L — ABNORMAL LOW (ref 135–145)

## 2020-09-29 LAB — CULTURE, BLOOD (SINGLE): Culture: NO GROWTH

## 2020-09-29 LAB — MAGNESIUM: Magnesium: 2.3 mg/dL (ref 1.7–2.4)

## 2020-09-29 LAB — GLUCOSE, PLEURAL OR PERITONEAL FLUID: Glucose, Fluid: 101 mg/dL

## 2020-09-29 LAB — AMYLASE, PLEURAL OR PERITONEAL FLUID: Amylase, Fluid: 13 U/L

## 2020-09-29 MED ORDER — LIDOCAINE HCL 1 % IJ SOLN
INTRAMUSCULAR | Status: AC
  Filled 2020-09-29: qty 20

## 2020-09-29 MED ORDER — LIDOCAINE HCL 1 % IJ SOLN
INTRAMUSCULAR | Status: DC | PRN
  Administered 2020-09-29: 10 mL
  Administered 2020-10-06: 5 mL
  Administered 2020-10-31: 20 mL

## 2020-09-29 NOTE — Progress Notes (Signed)
[facility]  ROUNDING NOTE   Subjective:  Pt seen and evaluated during HD. Tolerating well.  K a bit high at 5.8.    Objective:  Vital signs in last 24 hours:  Temperature 96.9 pulse 92 respirations 20 blood pressure 116/69   Physical Exam: General:  Chronically ill-appearing  Head:  Normocephalic, atraumatic. Moist oral mucosal membranes  Eyes:  Anicteric  Neck:  Tracheostomy in place  Lungs:   Coarse breath sounds bilateral, normal effort  Heart:  S1S2 no rubs  Abdomen:   Soft, nontender, bowel sounds present, PEG in place  Extremities:  Right AKA  Neurologic:  Awake, alert, following commands  Skin:  No lesions  Access:  Left upper extremity AV fistula    Basic Metabolic Panel: Recent Labs  Lab 09/23/20 0614 09/23/20 0614 09/24/20 0542 09/24/20 0542 09/25/20 0612 09/27/20 0500 09/27/20 1415 09/28/20 0632  NA 137  --  137  --  138 134*  --  131*  K 4.0   < > 4.8  --  3.6 3.0* 2.5* 5.8*  CL 102  --  105  --  106 100  --  99  CO2 23  --  18*  --  21* 20*  --  21*  GLUCOSE 146*  --  194*  --  192* 186*  --  150*  BUN 27*  --  40*  --  51* 34*  --  17  CREATININE 2.99*  --  3.71*  --  4.57* 3.67*  --  2.74*  CALCIUM 9.0   < > 8.6*   < > 8.8* 8.5*  --  8.3*  MG  --   --  2.1  --  2.1 2.0  --  1.7  PHOS  --   --  2.5  --  2.5 2.2*  --   --    < > = values in this interval not displayed.    Liver Function Tests: Recent Labs  Lab 09/23/20 0614 09/24/20 0542 09/25/20 0612 09/27/20 0500  AST 85*  --   --   --   ALT 37  --   --   --   ALKPHOS 317*  --   --   --   BILITOT 5.1*  --   --   --   PROT 7.1  --   --   --   ALBUMIN 1.2* 1.2* 1.1* 1.1*   No results for input(s): LIPASE, AMYLASE in the last 168 hours. No results for input(s): AMMONIA in the last 168 hours.  CBC: Recent Labs  Lab 09/24/20 0542 09/25/20 0612 09/27/20 0500 09/28/20 0632 09/29/20 0431  WBC 17.5* 17.2* 16.5* 14.4* 15.3*  HGB 7.7* 7.9* 6.9* 8.1* 8.3*  HCT 24.9* 24.8*  21.3* 26.3* 25.8*  MCV 94.3 92.2 89.9 90.7 89.9  PLT 538* 560* 549* 490* 578*    Cardiac Enzymes: No results for input(s): CKTOTAL, CKMB, CKMBINDEX, TROPONINI in the last 168 hours.  BNP: Invalid input(s): POCBNP  CBG: No results for input(s): GLUCAP in the last 168 hours.  Microbiology: Results for orders placed or performed during the hospital encounter of 09/22/20  Culture, respiratory (non-expectorated)     Status: None   Collection Time: 09/23/20 11:25 AM   Specimen: Tracheal Aspirate; Respiratory  Result Value Ref Range Status   Specimen Description TRACHEAL ASPIRATE  Final   Special Requests NONE  Final   Gram Stain   Final    RARE WBC PRESENT,BOTH PMN AND MONONUCLEAR NO ORGANISMS  SEEN Performed at [facility], [place] [address] [place], [place] [address]    Culture FEW KLEBSIELLA PNEUMONIAE  Final   Report Status 09/25/2020 FINAL  Final   Organism ID, Bacteria KLEBSIELLA PNEUMONIAE  Final      Susceptibility   Klebsiella pneumoniae - MIC*    AMPICILLIN >=32 RESISTANT Resistant     CEFAZOLIN <=4 SENSITIVE Sensitive     CEFEPIME 0.25 SENSITIVE Sensitive     CEFTAZIDIME <=1 SENSITIVE Sensitive     CEFTRIAXONE 1 SENSITIVE Sensitive     CIPROFLOXACIN <=0.25 SENSITIVE Sensitive     GENTAMICIN <=1 SENSITIVE Sensitive     IMIPENEM <=0.25 SENSITIVE Sensitive     TRIMETH/SULFA <=20 SENSITIVE Sensitive     AMPICILLIN/SULBACTAM 16 INTERMEDIATE Intermediate     PIP/TAZO 64 INTERMEDIATE Intermediate     * FEW KLEBSIELLA PNEUMONIAE  Culture, blood (routine x 2)     Status: None   Collection Time: 09/23/20  4:26 PM   Specimen: BLOOD  Result Value Ref Range Status   Specimen Description BLOOD BLOOD RIGHT HAND  Final   Special Requests   Final    AEROBIC BOTTLE ONLY Blood Culture results may not be optimal due to an inadequate volume of blood received in culture bottles   Culture   Final    NO GROWTH 5 DAYS Performed at [facility], [address]  [place], [place] [address]    Report Status 09/28/2020 FINAL  Final  Culture, blood (single)     Status: None   Collection Time: 09/24/20  6:00 AM   Specimen: BLOOD RIGHT HAND  Result Value Ref Range Status   Specimen Description BLOOD RIGHT HAND  Final   Special Requests   Final    BOTTLES DRAWN AEROBIC ONLY Blood Culture results may not be optimal due to an inadequate volume of blood received in culture bottles   Culture   Final    NO GROWTH 5 DAYS Performed at [facility], [place] [address], [place], [place] [address]    Report Status 09/29/2020 FINAL  Final    Coagulation Studies: No results for input(s): LABPROT, INR in the last 72 hours.  Urinalysis: No results for input(s): COLORURINE, LABSPEC, PHURINE, GLUCOSEU, HGBUR, BILIRUBINUR, KETONESUR, PROTEINUR, UROBILINOGEN, NITRITE, LEUKOCYTESUR in the last 72 hours.  Invalid input(s): APPERANCEUR    Imaging: CT ABDOMEN PELVIS WO CONTRAST  Result Date: 09/28/2020 CLINICAL DATA:  Abdominal pain with bowel obstruction suspected. Subtotal colectomy and PEG at outside hospital in September. Per floor notes there is leakage from the PEG site. EXAM: CT ABDOMEN AND PELVIS WITHOUT CONTRAST TECHNIQUE: Multidetector CT imaging of the abdomen and pelvis was performed following the standard protocol without IV contrast. COMPARISON:  None. FINDINGS: Lower chest: Volume loss and opacity at the right base. Rounded airspace opacity at the lingula. Hepatobiliary: No focal liver abnormality.Percutaneous cholecystostomy tube. Cholelithiasis. The gallbladder is decompressed. No focal right upper quadrant inflammation. Pancreas: Unremarkable. Spleen: Unremarkable. Adrenals/Urinary Tract: Negative adrenals. End-stage renal disease with symmetric renal atrophy. No hydronephrosis. Unremarkable bladder. Stomach/Bowel: Subtotal colectomy with right lower quadrant loop ileostomy and Hartmann's pouch appearance. Peg tube in position with at least 1 T tack still  visible. No obstructive pattern. Vascular/Lymphatic: Arterial calcifications.  No mass or adenopathy. Reproductive:Negative Other: Large volume ascites with suggestion of dependent debris or peritoneal thickening. In left lower quadrant ascites measures up to 13 cm in thickness. Strandy fat in the left inguinal canal, per report there was recent inguinal  hernia repair. Moderate pneumoperitoneum mainly in the ventral abdomen. Musculoskeletal: No acute abnormalities. These results were called by telephone at the time of interpretation on 09/28/2020 at 4:32 am to provider Dr Ricard Dillon , who verbally acknowledged these results. Case also discussed with charge nurse [name] and the patient is clinically stable; this was supposed to be a routine CT order. IMPRESSION: 1. Located PEG tube; skin leakage may be related to large volume ascites. The ascites is complex with dependent debris or peritoneal thickening. Pneumoperitoneum is also seen in the ventral abdomen and abscess or intra-abdominal leakage are both possible. Recommend further PEG inspection with contrast injection. Paracentesis or enhanced abdominal CT may also be helpful. 2. Multi segment atelectasis at the right base. There is a rounded airspace opacity at the lingula which is likely pneumonia. 3. Percutaneous cholecystostomy tube in place with decompressed gallbladder. There is cholelithiasis. Electronically Signed   By: Monte Fantasia M.D.   On: 09/28/2020 04:37   DG Chest Port 1 View  Result Date: 09/28/2020 CLINICAL DATA:  Line placement EXAM: PORTABLE CHEST 1 VIEW COMPARISON:  Earlier same day FINDINGS: New left IJ central line tip overlies the superior right atrium. Tracheostomy device is present. Density at the right lung base with elevation of the diaphragm. Additional density at the left lung base. Appearance is similar. Stable cardiomediastinal contours. No pneumothorax. IMPRESSION: Left IJ line tip overlies superior right atrium.  No pneumothorax.  Similar lung aeration with bibasilar atelectasis/consolidation. Electronically Signed   By: Macy Mis M.D.   On: 09/28/2020 12:12   DG CHEST PORT 1 VIEW  Result Date: 09/28/2020 CLINICAL DATA:  History of pneumonia. EXAM: PORTABLE CHEST 1 VIEW COMPARISON:  09/23/2020.  11/20/2016. FINDINGS: Tracheostomy tube in stable position. Heart size normal. Dense right base atelectasis again noted. Small right pleural effusion again noted. Mild infiltrate left lung base cannot be excluded. IMPRESSION: 1. Tracheostomy tube in stable position. 2. Dense right base atelectasis again noted. Small right pleural effusion again noted. Mild infiltrate left lung base cannot be excluded. Similar findings noted on prior exam. Electronically Signed   By: Taylor   On: 09/28/2020 07:00     Medications:     iohexol  Assessment/ Plan:  54 y.o. 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   1.  ESRD on HD.  Patient seen and evaluated during hemodialysis treatment.  Tolerating well.  2.  Hypotension.  Maintain the patient on albumin as well as midodrine for blood pressure support during dialysis treatments.  3.  Anemia of chronic kidney disease.   Hemoglobin & Hematocrit     Component Value Date/Time   HGB 8.3 (L) 09/29/2020 0431   HGB 11.2 (L) 10/19/2015 1559   HCT 25.8 (L) 09/29/2020 0431   HCT 35.1 (L) 10/19/2015 1559  Hemoglobin currently 8.3.  Improved posttransfusion.  4.  Secondary hyperparathyroidism.  Repeat serum phosphorus today.  5.  Acute respiratory  failure.  Weaning from the ventilator as per pulmonary/critical care.    LOS: 0 Jeremy Yu 10/8/20218:07 AM

## 2020-09-29 NOTE — Procedures (Addendum)
PROCEDURE SUMMARY:  [place] finds severely loculated ascites with innumerable septations/adhesions Successful US guided paracentesis from LLQ.  Yielded 900 mL of hazy amber fluid.  No immediate complications.  Pt tolerated well.   Specimen was sent for labs.  EBL < 46mL  Ascencion Dike PA-C 09/29/2020 12:21 PM

## 2020-09-29 NOTE — Progress Notes (Addendum)
Pulmonary Critical Care Medicine [place]   PULMONARY CRITICAL CARE SERVICE  PROGRESS NOTE  Date of Service: 09/29/2020  Jeremy Yu  STM:[id number]  DOB: 05/02/1966   DOA: 09/22/2020  Referring Physician: Merton Border, MD  HPI: Jeremy Yu is a 54 y.o. male seen for follow up of Acute on Chronic Respiratory Failure.  Patient remains on aerosol trach collar 28% FiO2 for 2-hour goal satting well no fever or distress.  Medications: Reviewed on Rounds  Physical Exam:  Vitals: Pulse 96 respirations 20 BP 116/69 O2 sat 100% temp 96.9  Ventilator Settings ATC 28%  . General: Comfortable at this time . Eyes: Grossly normal lids, irises & conjunctiva . ENT: grossly tongue is normal . Neck: no obvious mass . Cardiovascular: S1 S2 normal no gallop . Respiratory: No rales or rhonchi noted . Abdomen: soft . Skin: no rash seen on limited exam . Musculoskeletal: not rigid . Psychiatric:unable to assess . Neurologic: no seizure no involuntary movements         Lab Data:   Basic Metabolic Panel: Recent Labs  Lab 09/24/20 0542 09/24/20 0542 09/25/20 0612 09/27/20 0500 09/27/20 1415 09/28/20 0632 09/29/20 0751  NA 137  --  138 134*  --  131* 133*  K 4.8   < > 3.6 3.0* 2.5* 5.8* 3.2*  CL 105  --  106 100  --  99 100  CO2 18*  --  21* 20*  --  21* 22  GLUCOSE 194*  --  192* 186*  --  150* 137*  BUN 40*  --  51* 34*  --  17 27*  CREATININE 3.71*  --  4.57* 3.67*  --  2.74* 3.60*  CALCIUM 8.6*  --  8.8* 8.5*  --  8.3* 8.5*  MG 2.1  --  2.1 2.0  --  1.7 2.3  PHOS 2.5  --  2.5 2.2*  --   --  3.1   < > = values in this interval not displayed.    ABG: Recent Labs  Lab 09/22/20 1845  PHART 7.397  PCO2ART 40.5  PO2ART 86.4  HCO3 24.5  O2SAT 96.8    Liver Function Tests: Recent Labs  Lab 09/23/20 0614 09/24/20 0542 09/25/20 0612 09/27/20 0500 09/29/20 0751  AST 85*  --   --   --   --   ALT 37  --   --   --   --   ALKPHOS 317*  --   --    --   --   BILITOT 5.1*  --   --   --   --   PROT 7.1  --   --   --   --   ALBUMIN 1.2* 1.2* 1.1* 1.1* 1.2*   No results for input(s): LIPASE, AMYLASE in the last 168 hours. No results for input(s): AMMONIA in the last 168 hours.  CBC: Recent Labs  Lab 09/24/20 0542 09/25/20 0612 09/27/20 0500 09/28/20 0632 09/29/20 0431  WBC 17.5* 17.2* 16.5* 14.4* 15.3*  HGB 7.7* 7.9* 6.9* 8.1* 8.3*  HCT 24.9* 24.8* 21.3* 26.3* 25.8*  MCV 94.3 92.2 89.9 90.7 89.9  PLT 538* 560* 549* 490* 578*    Cardiac Enzymes: No results for input(s): CKTOTAL, CKMB, CKMBINDEX, TROPONINI in the last 168 hours.  BNP (last 3 results) No results for input(s): BNP in the last 8760 hours.  ProBNP (last 3 results) No results for input(s): PROBNP in the last 8760 hours.  Radiological Exams: CT ABDOMEN PELVIS  WO CONTRAST  Result Date: 09/28/2020 CLINICAL DATA:  Abdominal pain with bowel obstruction suspected. Subtotal colectomy and PEG at outside hospital in September. Per floor notes there is leakage from the PEG site. EXAM: CT ABDOMEN AND PELVIS WITHOUT CONTRAST TECHNIQUE: Multidetector CT imaging of the abdomen and pelvis was performed following the standard protocol without IV contrast. COMPARISON:  None. FINDINGS: Lower chest: Volume loss and opacity at the right base. Rounded airspace opacity at the lingula. Hepatobiliary: No focal liver abnormality.Percutaneous cholecystostomy tube. Cholelithiasis. The gallbladder is decompressed. No focal right upper quadrant inflammation. Pancreas: Unremarkable. Spleen: Unremarkable. Adrenals/Urinary Tract: Negative adrenals. End-stage renal disease with symmetric renal atrophy. No hydronephrosis. Unremarkable bladder. Stomach/Bowel: Subtotal colectomy with right lower quadrant loop ileostomy and Hartmann's pouch appearance. Peg tube in position with at least 1 T tack still visible. No obstructive pattern. Vascular/Lymphatic: Arterial calcifications.  No mass or adenopathy.  Reproductive:Negative Other: Large volume ascites with suggestion of dependent debris or peritoneal thickening. In left lower quadrant ascites measures up to 13 cm in thickness. Strandy fat in the left inguinal canal, per report there was recent inguinal hernia repair. Moderate pneumoperitoneum mainly in the ventral abdomen. Musculoskeletal: No acute abnormalities. These results were called by telephone at the time of interpretation on 09/28/2020 at 4:32 am to provider Dr Ricard Dillon , who verbally acknowledged these results. Case also discussed with charge nurse [name] and the patient is clinically stable; this was supposed to be a routine CT order. IMPRESSION: 1. Located PEG tube; skin leakage may be related to large volume ascites. The ascites is complex with dependent debris or peritoneal thickening. Pneumoperitoneum is also seen in the ventral abdomen and abscess or intra-abdominal leakage are both possible. Recommend further PEG inspection with contrast injection. Paracentesis or enhanced abdominal CT may also be helpful. 2. Multi segment atelectasis at the right base. There is a rounded airspace opacity at the lingula which is likely pneumonia. 3. Percutaneous cholecystostomy tube in place with decompressed gallbladder. There is cholelithiasis. Electronically Signed   By: Monte Fantasia M.D.   On: 09/28/2020 04:37   DG Chest Port 1 View  Result Date: 09/28/2020 CLINICAL DATA:  Line placement EXAM: PORTABLE CHEST 1 VIEW COMPARISON:  Earlier same day FINDINGS: New left IJ central line tip overlies the superior right atrium. Tracheostomy device is present. Density at the right lung base with elevation of the diaphragm. Additional density at the left lung base. Appearance is similar. Stable cardiomediastinal contours. No pneumothorax. IMPRESSION: Left IJ line tip overlies superior right atrium.  No pneumothorax. Similar lung aeration with bibasilar atelectasis/consolidation. Electronically Signed   By: Macy Mis M.D.   On: 09/28/2020 12:12   DG CHEST PORT 1 VIEW  Result Date: 09/28/2020 CLINICAL DATA:  History of pneumonia. EXAM: PORTABLE CHEST 1 VIEW COMPARISON:  09/23/2020.  11/20/2016. FINDINGS: Tracheostomy tube in stable position. Heart size normal. Dense right base atelectasis again noted. Small right pleural effusion again noted. Mild infiltrate left lung base cannot be excluded. IMPRESSION: 1. Tracheostomy tube in stable position. 2. Dense right base atelectasis again noted. Small right pleural effusion again noted. Mild infiltrate left lung base cannot be excluded. Similar findings noted on prior exam. Electronically Signed   By: [place]   On: 09/28/2020 07:00   IR Paracentesis  Result Date: 09/29/2020 INDICATION: History of major abdominal surgery at outside facility including recent PEG placement. Abdominal distention and ascites noted. Request for diagnostic and therapeutic paracentesis EXAM: ULTRASOUND GUIDED LEFT LOWER QUADRANT PARACENTESIS MEDICATIONS: None.  COMPLICATIONS: None immediate. PROCEDURE: Informed written consent was obtained from the patient after a discussion of the risks, benefits and alternatives to treatment. A timeout was performed prior to the initiation of the procedure. Initial ultrasound scanning demonstrates a large amount of ascites within the left lower abdominal quadrant. However, the ascites is severely loculated with innumerable septations and adhesions likely from his recent surgeries. The left lower abdomen was prepped and draped in the usual sterile fashion. 1% lidocaine was used for local anesthesia. Following this, a 19 gauge, 7-cm, Yueh catheter was introduced. An ultrasound image was saved for documentation purposes. The paracentesis was performed. The catheter was removed and a dressing was applied. The patient tolerated the procedure well without immediate post procedural complication. FINDINGS: A total of approximately only 900 mL of hazy amber fluid  was removed. Samples were sent to the laboratory as requested by the clinical team. IMPRESSION: Successful ultrasound-guided paracentesis yielding 900 mL of peritoneal fluid. Read by: Ascencion Dike PA-C Electronically Signed   By: Jacqulynn Cadet M.D.   On: 09/29/2020 12:26    Assessment/Plan Active Problems:   Acute on chronic respiratory failure with hypoxia (HCC)   End stage renal failure on dialysis (HCC)   Severe sepsis with septic shock (CODE) (HCC)   Cardiac arrest (HCC)   Metabolic encephalopathy   1. Acute on chronic respiratory failure with hypoxia patient will continue on aerosol trach collar 20% FiO2 for a 2-hour goal at this time we will continue aggressive pulmonary toilet supportive measures. 2. End-stage renal failure on hemodialysis we will continue to follow along 3. Severe sepsis with shock hemodynamics are stable. 4. Cardiac arrest rhythm is stable 5. Metabolic encephalopathy no change we will continue to follow   I have personally seen and evaluated the patient, evaluated laboratory and imaging results, formulated the assessment and plan and placed orders. The Patient requires high complexity decision making with multiple systems involvement.  Rounds were done with the Respiratory Therapy Director and Staff therapists and discussed with nursing staff also.  Allyne Gee, MD [facility] Pulmonary Critical Care Medicine Sleep Medicine

## 2020-09-30 DIAGNOSIS — G9341 Metabolic encephalopathy: Secondary | ICD-10-CM | POA: Diagnosis not present

## 2020-09-30 DIAGNOSIS — J9621 Acute and chronic respiratory failure with hypoxia: Secondary | ICD-10-CM | POA: Diagnosis not present

## 2020-09-30 DIAGNOSIS — N186 End stage renal disease: Secondary | ICD-10-CM | POA: Diagnosis not present

## 2020-09-30 DIAGNOSIS — I469 Cardiac arrest, cause unspecified: Secondary | ICD-10-CM | POA: Diagnosis not present

## 2020-09-30 LAB — POTASSIUM: Potassium: 3.6 mmol/L (ref 3.5–5.1)

## 2020-09-30 NOTE — Progress Notes (Signed)
Pulmonary Critical Care Medicine [facility] [place]   PULMONARY CRITICAL CARE SERVICE  PROGRESS NOTE  Date of Service: 09/30/2020  Jeremy Yu  JBL:[id number]  DOB: 1966-09-20   DOA: 09/22/2020  Referring Physician: Carron Curie, MD  HPI: Jeremy Yu is a 54 y.o. male seen for follow up of Acute on Chronic Respiratory Failure.  Patient is on T collar currently on 28% FiO2 with a goal of 2 hours today  Medications: Reviewed on Rounds  Physical Exam:  Vitals: Temperature is 98.7 pulse 89 respiratory 25 blood pressure is 97/55 saturations 100%  Ventilator Settings on T collar with an FiO2 28%   General: Comfortable at this time  Eyes: Grossly normal lids, irises & conjunctiva  ENT: grossly tongue is normal  Neck: no obvious mass  Cardiovascular: S1 S2 normal no gallop  Respiratory: Scattered rhonchi expansion is equal  Abdomen: soft  Skin: no rash seen on limited exam  Musculoskeletal: not rigid  Psychiatric:unable to assess  Neurologic: no seizure no involuntary movements         Lab Data:   Basic Metabolic Panel: Recent Labs  Lab 09/24/20 0542 09/24/20 0542 09/25/20 0612 09/25/20 0612 09/27/20 0500 09/27/20 1415 09/28/20 0632 09/29/20 0751 09/30/20 0454  NA 137  --  138  --  134*  --  131* 133*  --   K 4.8   < > 3.6   < > 3.0* 2.5* 5.8* 3.2* 3.6  CL 105  --  106  --  100  --  99 100  --   CO2 18*  --  21*  --  20*  --  21* 22  --   GLUCOSE 194*  --  192*  --  186*  --  150* 137*  --   BUN 40*  --  51*  --  34*  --  17 27*  --   CREATININE 3.71*  --  4.57*  --  3.67*  --  2.74* 3.60*  --   CALCIUM 8.6*  --  8.8*  --  8.5*  --  8.3* 8.5*  --   MG 2.1  --  2.1  --  2.0  --  1.7 2.3  --   PHOS 2.5  --  2.5  --  2.2*  --   --  3.1  --    < > = values in this interval not displayed.    ABG: No results for input(s): PHART, PCO2ART, PO2ART, HCO3, O2SAT in the last 168 hours.  Liver Function Tests: Recent Labs  Lab  09/24/20 0542 09/25/20 0612 09/27/20 0500 09/29/20 0751  ALBUMIN 1.2* 1.1* 1.1* 1.2*   No results for input(s): LIPASE, AMYLASE in the last 168 hours. No results for input(s): AMMONIA in the last 168 hours.  CBC: Recent Labs  Lab 09/24/20 0542 09/25/20 0612 09/27/20 0500 09/28/20 0632 09/29/20 0431  WBC 17.5* 17.2* 16.5* 14.4* 15.3*  HGB 7.7* 7.9* 6.9* 8.1* 8.3*  HCT 24.9* 24.8* 21.3* 26.3* 25.8*  MCV 94.3 92.2 89.9 90.7 89.9  PLT 538* 560* 549* 490* 578*    Cardiac Enzymes: No results for input(s): CKTOTAL, CKMB, CKMBINDEX, TROPONINI in the last 168 hours.  BNP (last 3 results) No results for input(s): BNP in the last 8760 hours.  ProBNP (last 3 results) No results for input(s): PROBNP in the last 8760 hours.  Radiological Exams: DG Chest Port 1 View  Result Date: 09/28/2020 CLINICAL DATA:  Line placement EXAM: PORTABLE CHEST 1 VIEW COMPARISON:  Earlier same day FINDINGS: New left IJ central line tip overlies the superior right atrium. Tracheostomy device is present. Density at the right lung base with elevation of the diaphragm. Additional density at the left lung base. Appearance is similar. Stable cardiomediastinal contours. No pneumothorax. IMPRESSION: Left IJ line tip overlies superior right atrium.  No pneumothorax. Similar lung aeration with bibasilar atelectasis/consolidation. Electronically Signed   By: Macy Mis M.D.   On: 09/28/2020 12:12   IR Paracentesis  Result Date: 09/29/2020 INDICATION: History of major abdominal surgery at outside facility including recent PEG placement. Abdominal distention and ascites noted. Request for diagnostic and therapeutic paracentesis EXAM: ULTRASOUND GUIDED LEFT LOWER QUADRANT PARACENTESIS MEDICATIONS: None. COMPLICATIONS: None immediate. PROCEDURE: Informed written consent was obtained from the patient after a discussion of the risks, benefits and alternatives to treatment. A timeout was performed prior to the initiation of  the procedure. Initial ultrasound scanning demonstrates a large amount of ascites within the left lower abdominal quadrant. However, the ascites is severely loculated with innumerable septations and adhesions likely from his recent surgeries. The left lower abdomen was prepped and draped in the usual sterile fashion. 1% lidocaine was used for local anesthesia. Following this, a 19 gauge, 7-cm, Yueh catheter was introduced. An ultrasound image was saved for documentation purposes. The paracentesis was performed. The catheter was removed and a dressing was applied. The patient tolerated the procedure well without immediate post procedural complication. FINDINGS: A total of approximately only 900 mL of hazy amber fluid was removed. Samples were sent to the laboratory as requested by the clinical team. IMPRESSION: Successful ultrasound-guided paracentesis yielding 900 mL of peritoneal fluid. Read by: Ascencion Dike PA-C Electronically Signed   By: Jacqulynn Cadet M.D.   On: 09/29/2020 12:26    Assessment/Plan Active Problems:   Acute on chronic respiratory failure with hypoxia (HCC)   End stage renal failure on dialysis (HCC)   Severe sepsis with septic shock (CODE) (HCC)   Cardiac arrest (HCC)   Metabolic encephalopathy   1. Acute on chronic respiratory failure hypoxia we will continue with weaning on T collar to 2-hour goal today 2. End-stage renal failure being seen by nephrology 3. Severe sepsis with shock hemodynamics are stable 4. Cardiac arrest rhythm is stable 5. Metabolic encephalopathy no change   I have personally seen and evaluated the patient, evaluated laboratory and imaging results, formulated the assessment and plan and placed orders. The Patient requires high complexity decision making with multiple systems involvement.  Rounds were done with the Respiratory Therapy Director and Staff therapists and discussed with nursing staff also.  Allyne Gee, MD [facility] Pulmonary Critical  Care Medicine Sleep Medicine

## 2020-10-01 ENCOUNTER — Other Ambulatory Visit (HOSPITAL_COMMUNITY): Payer: Medicare Other

## 2020-10-01 DIAGNOSIS — I469 Cardiac arrest, cause unspecified: Secondary | ICD-10-CM | POA: Diagnosis not present

## 2020-10-01 DIAGNOSIS — N186 End stage renal disease: Secondary | ICD-10-CM | POA: Diagnosis not present

## 2020-10-01 DIAGNOSIS — G9341 Metabolic encephalopathy: Secondary | ICD-10-CM | POA: Diagnosis not present

## 2020-10-01 DIAGNOSIS — J9621 Acute and chronic respiratory failure with hypoxia: Secondary | ICD-10-CM | POA: Diagnosis not present

## 2020-10-01 LAB — BASIC METABOLIC PANEL
Anion gap: 11 (ref 5–15)
BUN: 23 mg/dL — ABNORMAL HIGH (ref 6–20)
CO2: 24 mmol/L (ref 22–32)
Calcium: 8.5 mg/dL — ABNORMAL LOW (ref 8.9–10.3)
Chloride: 101 mmol/L (ref 98–111)
Creatinine, Ser: 3.43 mg/dL — ABNORMAL HIGH (ref 0.61–1.24)
GFR, Estimated: 19 mL/min — ABNORMAL LOW (ref >60)
Glucose, Bld: 106 mg/dL — ABNORMAL HIGH (ref 70–99)
Potassium: 2.8 mmol/L — ABNORMAL LOW (ref 3.5–5.1)
Sodium: 136 mmol/L (ref 135–145)

## 2020-10-01 LAB — POTASSIUM: Potassium: 3.5 mmol/L (ref 3.5–5.1)

## 2020-10-01 LAB — MAGNESIUM: Magnesium: 1.9 mg/dL (ref 1.7–2.4)

## 2020-10-01 NOTE — Progress Notes (Signed)
Pulmonary Critical Care Medicine [place]   PULMONARY CRITICAL CARE SERVICE  PROGRESS NOTE  Date of Service: 10/01/2020  Jeremy Yu  TKZ:[id number]  DOB: [birth year]   DOA: 09/22/2020  Referring Physician: Merton Border, MD  HPI: Jeremy Yu is a 54 y.o. male seen for follow up of Acute on Chronic Respiratory Failure.  Patient is on full support on the ventilator and has been on assist control mode currently on 28% FiO2 with good saturations  Medications: Reviewed on Rounds  Physical Exam:  Vitals: Temperature 99.2 pulse 93 respiratory rate 22 blood pressure is 134/70 saturations 94%  Ventilator Settings on assist control FiO2 is 28% tidal volume 500 with a PEEP of 5   General: Comfortable at this time  Eyes: Grossly normal lids, irises & conjunctiva  ENT: grossly tongue is normal  Neck: no obvious mass  Cardiovascular: S1 S2 normal no gallop  Respiratory: Scattered rhonchi very coarse breath sounds  Abdomen: soft  Skin: no rash seen on limited exam  Musculoskeletal: not rigid  Psychiatric:unable to assess  Neurologic: no seizure no involuntary movements         Lab Data:   Basic Metabolic Panel: Recent Labs  Lab 09/25/20 0612 09/25/20 0612 09/27/20 0500 09/27/20 0500 09/27/20 1415 09/28/20 5732 09/29/20 0751 09/30/20 0454 10/01/20 0326  NA 138  --  134*  --   --  131* 133*  --  136  K 3.6   < > 3.0*   < > 2.5* 5.8* 3.2* 3.6 2.8*  CL 106  --  100  --   --  99 100  --  101  CO2 21*  --  20*  --   --  21* 22  --  24  GLUCOSE 192*  --  186*  --   --  150* 137*  --  106*  BUN 51*  --  34*  --   --  17 27*  --  23*  CREATININE 4.57*  --  3.67*  --   --  2.74* 3.60*  --  3.43*  CALCIUM 8.8*  --  8.5*  --   --  8.3* 8.5*  --  8.5*  MG 2.1  --  2.0  --   --  1.7 2.3  --  1.9  PHOS 2.5  --  2.2*  --   --   --  3.1  --   --    < > = values in this interval not displayed.    ABG: No results for input(s): PHART, PCO2ART,  PO2ART, HCO3, O2SAT in the last 168 hours.  Liver Function Tests: Recent Labs  Lab 09/25/20 0612 09/27/20 0500 09/29/20 0751  ALBUMIN 1.1* 1.1* 1.2*   No results for input(s): LIPASE, AMYLASE in the last 168 hours. No results for input(s): AMMONIA in the last 168 hours.  CBC: Recent Labs  Lab 09/25/20 0612 09/27/20 0500 09/28/20 0632 09/29/20 0431  WBC 17.2* 16.5* 14.4* 15.3*  HGB 7.9* 6.9* 8.1* 8.3*  HCT 24.8* 21.3* 26.3* 25.8*  MCV 92.2 89.9 90.7 89.9  PLT 560* 549* 490* 578*    Cardiac Enzymes: No results for input(s): CKTOTAL, CKMB, CKMBINDEX, TROPONINI in the last 168 hours.  BNP (last 3 results) No results for input(s): BNP in the last 8760 hours.  ProBNP (last 3 results) No results for input(s): PROBNP in the last 8760 hours.  Radiological Exams: DG Abd Portable 1V  Result Date: 10/01/2020 CLINICAL DATA:  Ileus. EXAM: PORTABLE ABDOMEN -  1 VIEW COMPARISON:  CT abdomen pelvis 09/28/2020 FINDINGS: Peg tube projects over the left upper quadrant. Cholecystostomy tube right upper quadrant. Heterogeneous opacities lung bases bilaterally. Paucity of bowel gas. Lumbar spine degenerative changes. IMPRESSION: Paucity of bowel gas. Support apparatus as above. Electronically Signed   By: Lovey Newcomer M.D.   On: 10/01/2020 09:31   IR Paracentesis  Result Date: 09/29/2020 INDICATION: History of major abdominal surgery at outside facility including recent PEG placement. Abdominal distention and ascites noted. Request for diagnostic and therapeutic paracentesis EXAM: ULTRASOUND GUIDED LEFT LOWER QUADRANT PARACENTESIS MEDICATIONS: None. COMPLICATIONS: None immediate. PROCEDURE: Informed written consent was obtained from the patient after a discussion of the risks, benefits and alternatives to treatment. A timeout was performed prior to the initiation of the procedure. Initial ultrasound scanning demonstrates a large amount of ascites within the left lower abdominal quadrant. However,  the ascites is severely loculated with innumerable septations and adhesions likely from his recent surgeries. The left lower abdomen was prepped and draped in the usual sterile fashion. 1% lidocaine was used for local anesthesia. Following this, a 19 gauge, 7-cm, Yueh catheter was introduced. An ultrasound image was saved for documentation purposes. The paracentesis was performed. The catheter was removed and a dressing was applied. The patient tolerated the procedure well without immediate post procedural complication. FINDINGS: A total of approximately only 900 mL of hazy amber fluid was removed. Samples were sent to the laboratory as requested by the clinical team. IMPRESSION: Successful ultrasound-guided paracentesis yielding 900 mL of peritoneal fluid. Read by: Ascencion Dike PA-C Electronically Signed   By: Jacqulynn Cadet M.D.   On: 09/29/2020 12:26    Assessment/Plan Active Problems:   Acute on chronic respiratory failure with hypoxia (HCC)   End stage renal failure on dialysis (HCC)   Severe sepsis with septic shock (CODE) (HCC)   Cardiac arrest (HCC)   Metabolic encephalopathy   1. Acute on chronic respiratory failure with hypoxia we will continue with full support on the vent right now on assist control mode respiratory therapy will assess the wean readiness. 2. End-stage renal failure on hemodialysis we will continue to follow 3. Severe sepsis with shock resolved 4. Cardiac arrest rhythm stable 5. Metabolic encephalopathy no change we will continue to follow along.   I have personally seen and evaluated the patient, evaluated laboratory and imaging results, formulated the assessment and plan and placed orders. The Patient requires high complexity decision making with multiple systems involvement.  Rounds were done with the Respiratory Therapy Director and Staff therapists and discussed with nursing staff also.  Allyne Gee, MD [facility] Pulmonary Critical Care Medicine Sleep  Medicine

## 2020-10-02 ENCOUNTER — Other Ambulatory Visit (HOSPITAL_COMMUNITY): Payer: Medicare Other

## 2020-10-02 DIAGNOSIS — I469 Cardiac arrest, cause unspecified: Secondary | ICD-10-CM | POA: Diagnosis not present

## 2020-10-02 DIAGNOSIS — N186 End stage renal disease: Secondary | ICD-10-CM | POA: Diagnosis not present

## 2020-10-02 DIAGNOSIS — G9341 Metabolic encephalopathy: Secondary | ICD-10-CM | POA: Diagnosis not present

## 2020-10-02 DIAGNOSIS — J9621 Acute and chronic respiratory failure with hypoxia: Secondary | ICD-10-CM | POA: Diagnosis not present

## 2020-10-02 LAB — RENAL FUNCTION PANEL
Albumin: 1.3 g/dL — ABNORMAL LOW (ref 3.5–5.0)
Anion gap: 12 (ref 5–15)
BUN: 35 mg/dL — ABNORMAL HIGH (ref 6–20)
CO2: 22 mmol/L (ref 22–32)
Calcium: 8.5 mg/dL — ABNORMAL LOW (ref 8.9–10.3)
Chloride: 101 mmol/L (ref 98–111)
Creatinine, Ser: 4.31 mg/dL — ABNORMAL HIGH (ref 0.61–1.24)
GFR, Estimated: 15 mL/min — ABNORMAL LOW (ref >60)
Glucose, Bld: 156 mg/dL — ABNORMAL HIGH (ref 70–99)
Phosphorus: 4.3 mg/dL (ref 2.5–4.6)
Potassium: 3 mmol/L — ABNORMAL LOW (ref 3.5–5.1)
Sodium: 135 mmol/L (ref 135–145)

## 2020-10-02 LAB — CBC
HCT: 23.8 % — ABNORMAL LOW (ref 39.0–52.0)
Hemoglobin: 7.6 g/dL — ABNORMAL LOW (ref 13.0–17.0)
MCH: 29.1 pg (ref 26.0–34.0)
MCHC: 31.9 g/dL (ref 30.0–36.0)
MCV: 91.2 fL (ref 80.0–100.0)
Platelets: 512 10*3/uL — ABNORMAL HIGH (ref 150–400)
RBC: 2.61 MIL/uL — ABNORMAL LOW (ref 4.22–5.81)
RDW: 22.1 % — ABNORMAL HIGH (ref 11.5–15.5)
WBC: 21.8 10*3/uL — ABNORMAL HIGH (ref 4.0–10.5)
nRBC: 0.1 % (ref 0.0–0.2)

## 2020-10-02 LAB — CYTOLOGY - NON PAP

## 2020-10-02 NOTE — Progress Notes (Signed)
[facility]  ROUNDING NOTE   Subjective:  Patient due for dialysis treatment today. Still on the ventilator. Resting comfortably in bed at the moment.  Objective:  Vital signs in last 24 hours:  Temperature 98.7 pulse 84 respirations 20 blood pressure 113/58   Physical Exam: General:  Chronically ill-appearing  Head:  Normocephalic, atraumatic. Moist oral mucosal membranes  Eyes:  Anicteric  Neck:  Tracheostomy in place  Lungs:   Coarse breath sounds bilateral, normal effort  Heart:  S1S2 no rubs  Abdomen:   Soft, nontender, bowel sounds present, PEG in place  Extremities:  Right AKA  Neurologic:  Awake, alert, following commands  Skin:  No lesions  Access:  Left upper extremity AV fistula    Basic Metabolic Panel: Recent Labs  Lab 09/27/20 0500 09/27/20 1415 09/28/20 0632 09/29/20 0751 09/30/20 0454 10/01/20 0326 10/01/20 1608  NA 134*  --  131* 133*  --  136  --   K 3.0*   < > 5.8* 3.2* 3.6 2.8* 3.5  CL 100  --  99 100  --  101  --   CO2 20*  --  21* 22  --  24  --   GLUCOSE 186*  --  150* 137*  --  106*  --   BUN 34*  --  17 27*  --  23*  --   CREATININE 3.67*  --  2.74* 3.60*  --  3.43*  --   CALCIUM 8.5*  --  8.3* 8.5*  --  8.5*  --   MG 2.0  --  1.7 2.3  --  1.9  --   PHOS 2.2*  --   --  3.1  --   --   --    < > = values in this interval not displayed.    Liver Function Tests: Recent Labs  Lab 09/27/20 0500 09/29/20 0751  ALBUMIN 1.1* 1.2*   No results for input(s): LIPASE, AMYLASE in the last 168 hours. No results for input(s): AMMONIA in the last 168 hours.  CBC: Recent Labs  Lab 09/27/20 0500 09/28/20 0632 09/29/20 0431  WBC 16.5* 14.4* 15.3*  HGB 6.9* 8.1* 8.3*  HCT 21.3* 26.3* 25.8*  MCV 89.9 90.7 89.9  PLT 549* 490* 578*    Cardiac Enzymes: No results for input(s): CKTOTAL, CKMB, CKMBINDEX, TROPONINI in the last 168 hours.  BNP: Invalid input(s): POCBNP  CBG: No results for input(s): GLUCAP in the last 168  hours.  Microbiology: Results for orders placed or performed during the hospital encounter of 09/22/20  Culture, respiratory (non-expectorated)     Status: None   Collection Time: 09/23/20 11:25 AM   Specimen: Tracheal Aspirate; Respiratory  Result Value Ref Range Status   Specimen Description TRACHEAL ASPIRATE  Final   Special Requests NONE  Final   Gram Stain   Final    RARE WBC PRESENT,BOTH PMN AND MONONUCLEAR NO ORGANISMS SEEN Performed at [facility], [address], [place], [place] [address]    Culture FEW KLEBSIELLA PNEUMONIAE  Final   Report Status 09/25/2020 FINAL  Final   Organism ID, Bacteria KLEBSIELLA PNEUMONIAE  Final      Susceptibility   Klebsiella pneumoniae - MIC*    AMPICILLIN >=32 RESISTANT Resistant     CEFAZOLIN <=4 SENSITIVE Sensitive     CEFEPIME 0.25 SENSITIVE Sensitive     CEFTAZIDIME <=1 SENSITIVE Sensitive     CEFTRIAXONE 1 SENSITIVE Sensitive     CIPROFLOXACIN <=0.25 SENSITIVE  Sensitive     GENTAMICIN <=1 SENSITIVE Sensitive     IMIPENEM <=0.25 SENSITIVE Sensitive     TRIMETH/SULFA <=20 SENSITIVE Sensitive     AMPICILLIN/SULBACTAM 16 INTERMEDIATE Intermediate     PIP/TAZO 64 INTERMEDIATE Intermediate     * FEW KLEBSIELLA PNEUMONIAE  Culture, blood (routine x 2)     Status: None   Collection Time: 09/23/20  4:26 PM   Specimen: BLOOD  Result Value Ref Range Status   Specimen Description BLOOD BLOOD RIGHT HAND  Final   Special Requests   Final    AEROBIC BOTTLE ONLY Blood Culture results may not be optimal due to an inadequate volume of blood received in culture bottles   Culture   Final    NO GROWTH 5 DAYS Performed at [facility], [place] [address], [place], [place] [address]    Report Status 09/28/2020 FINAL  Final  Culture, blood (single)     Status: None   Collection Time: 09/24/20  6:00 AM   Specimen: BLOOD RIGHT HAND  Result Value Ref Range Status   Specimen Description BLOOD RIGHT HAND  Final   Special Requests   Final     BOTTLES DRAWN AEROBIC ONLY Blood Culture results may not be optimal due to an inadequate volume of blood received in culture bottles   Culture   Final    NO GROWTH 5 DAYS Performed at [facility], [place] [address], [place], [place] [address]    Report Status 09/29/2020 FINAL  Final  Body fluid culture     Status: None (Preliminary result)   Collection Time: 09/29/20 12:27 PM   Specimen: PATH Cytology Peritoneal fluid  Result Value Ref Range Status   Specimen Description PERITONEAL FLUID  Final   Special Requests NONE  Final   Gram Stain   Final    WBC PRESENT,BOTH PMN AND MONONUCLEAR NO ORGANISMS SEEN CYTOSPIN SMEAR    Culture   Final    NO GROWTH < 24 HOURS Performed at [facility], [place] [address] [place], [place] [address]    Report Status PENDING  Incomplete    Coagulation Studies: No results for input(s): LABPROT, INR in the last 72 hours.  Urinalysis: No results for input(s): COLORURINE, LABSPEC, PHURINE, GLUCOSEU, HGBUR, BILIRUBINUR, KETONESUR, PROTEINUR, UROBILINOGEN, NITRITE, LEUKOCYTESUR in the last 72 hours.  Invalid input(s): APPERANCEUR    Imaging: DG Abd Portable 1V  Result Date: 10/01/2020 CLINICAL DATA:  Ileus. EXAM: PORTABLE ABDOMEN - 1 VIEW COMPARISON:  CT abdomen pelvis 09/28/2020 FINDINGS: Peg tube projects over the left upper quadrant. Cholecystostomy tube right upper quadrant. Heterogeneous opacities lung bases bilaterally. Paucity of bowel gas. Lumbar spine degenerative changes. IMPRESSION: Paucity of bowel gas. Support apparatus as above. Electronically Signed   By: Lovey Newcomer M.D.   On: 10/01/2020 09:31     Medications:     iohexol, lidocaine  Assessment/ Plan:  54 y.o. male  with a PMHx of ESRD on HD, C. difficile colitis with subsequent colonic resection and colostomy placement, severe protein calorie malnutrition, diabetes mellitus type 2, anemia of chronic kidney disease, combined systolic and diastolic heart failure,  hyperlipidemia, fatty liver disease, sacral decubitus ulcer, anemia of chronic kidney disease, secondary hyperparathyroidism, right above-the-knee amputation, who was admitted to [facility] on 09/22/2020 for ongoing care.   1.  ESRD on HD.  Patient due for dialysis treatment today.  Tolerating dialysis treatments well.  2.  Hypotension.  Blood pressure currently 113/58.  Patient maintained  on albumin and midodrine.  3.  Anemia of chronic kidney disease.   Lab Results  Component Value Date   HGB 8.3 (L) 09/29/2020  Hemoglobin 8.3.  Maintain the patient on Retacrit.   4.  Secondary hyperparathyroidism.  Phosphorus 3.1 at last check and at target.  Continue to monitor.  5.  Acute respiratory failure.  Patient maintained on ventilatory support.    LOS: 0 Michiko Lineman 10/11/20217:55 AM

## 2020-10-02 NOTE — Progress Notes (Signed)
Pulmonary Critical Care Medicine [place]   PULMONARY CRITICAL CARE SERVICE  PROGRESS NOTE  Date of Service: 10/02/2020  Jeremy Yu  HER:[id number]  DOB: 1966-10-18   DOA: 09/22/2020  Referring Physician: Merton Border, MD  HPI: Jeremy Yu is a 54 y.o. male seen for follow up of Acute on Chronic Respiratory Failure.  Patient currently is on T collar has been on 28% FiO2 with a goal of up to 8 hours  Medications: Reviewed on Rounds  Physical Exam:  Vitals: Temperature 98.7 pulse 84 respiratory rate 20 blood pressure is 115/58 saturations 99%  Ventilator Settings on T collar with an FiO2 28% goal of 8 hours   General: Comfortable at this time  Eyes: Grossly normal lids, irises & conjunctiva  ENT: grossly tongue is normal  Neck: no obvious mass  Cardiovascular: S1 S2 normal no gallop  Respiratory: No rhonchi very coarse breath sounds  Abdomen: soft  Skin: no rash seen on limited exam  Musculoskeletal: not rigid  Psychiatric:unable to assess  Neurologic: no seizure no involuntary movements         Lab Data:   Basic Metabolic Panel: Recent Labs  Lab 09/27/20 0500 09/27/20 1415 09/28/20 0632 09/29/20 0751 09/30/20 0454 10/01/20 0326 10/01/20 1608  NA 134*  --  131* 133*  --  136  --   K 3.0*   < > 5.8* 3.2* 3.6 2.8* 3.5  CL 100  --  99 100  --  101  --   CO2 20*  --  21* 22  --  24  --   GLUCOSE 186*  --  150* 137*  --  106*  --   BUN 34*  --  17 27*  --  23*  --   CREATININE 3.67*  --  2.74* 3.60*  --  3.43*  --   CALCIUM 8.5*  --  8.3* 8.5*  --  8.5*  --   MG 2.0  --  1.7 2.3  --  1.9  --   PHOS 2.2*  --   --  3.1  --   --   --    < > = values in this interval not displayed.    ABG: No results for input(s): PHART, PCO2ART, PO2ART, HCO3, O2SAT in the last 168 hours.  Liver Function Tests: Recent Labs  Lab 09/27/20 0500 09/29/20 0751  ALBUMIN 1.1* 1.2*   No results for input(s): LIPASE, AMYLASE in the last 168  hours. No results for input(s): AMMONIA in the last 168 hours.  CBC: Recent Labs  Lab 09/27/20 0500 09/28/20 0632 09/29/20 0431  WBC 16.5* 14.4* 15.3*  HGB 6.9* 8.1* 8.3*  HCT 21.3* 26.3* 25.8*  MCV 89.9 90.7 89.9  PLT 549* 490* 578*    Cardiac Enzymes: No results for input(s): CKTOTAL, CKMB, CKMBINDEX, TROPONINI in the last 168 hours.  BNP (last 3 results) No results for input(s): BNP in the last 8760 hours.  ProBNP (last 3 results) No results for input(s): PROBNP in the last 8760 hours.  Radiological Exams: DG Abd Portable 1V  Result Date: 10/02/2020 CLINICAL DATA:  Ileus. EXAM: PORTABLE ABDOMEN - 1 VIEW COMPARISON:  10/01/2020 FINDINGS: Mild gaseous distention of the stomach noted. No diffuse gaseous small bowel dilatation to suggest obstruction or ileus. Right upper quadrant drain again noted. Visualized bony anatomy unremarkable. IMPRESSION: Mild gaseous distention of the stomach. Otherwise stable. Electronically Signed   By: Misty Stanley M.D.   On: 10/02/2020 07:54   DG  Abd Portable 1V  Result Date: 10/01/2020 CLINICAL DATA:  Ileus. EXAM: PORTABLE ABDOMEN - 1 VIEW COMPARISON:  CT abdomen pelvis 09/28/2020 FINDINGS: Peg tube projects over the left upper quadrant. Cholecystostomy tube right upper quadrant. Heterogeneous opacities lung bases bilaterally. Paucity of bowel gas. Lumbar spine degenerative changes. IMPRESSION: Paucity of bowel gas. Support apparatus as above. Electronically Signed   By: Lovey Newcomer M.D.   On: 10/01/2020 09:31    Assessment/Plan Active Problems:   Acute on chronic respiratory failure with hypoxia (HCC)   End stage renal failure on dialysis (HCC)   Severe sepsis with septic shock (CODE) (HCC)   Cardiac arrest (HCC)   Metabolic encephalopathy   1. Acute on chronic respiratory failure hypoxia we will continue with T collar trials currently on 20% FiO2 maximum goal today is 8 hours 2. End-stage renal failure on hemodialysis we will continue  to monitor 3. Severe sepsis with shock supportive care 4. Cardiac arrest rhythm stable 5. Metabolic encephalopathy no change   I have personally seen and evaluated the patient, evaluated laboratory and imaging results, formulated the assessment and plan and placed orders. The Patient requires high complexity decision making with multiple systems involvement.  Rounds were done with the Respiratory Therapy Director and Staff therapists and discussed with nursing staff also.  Allyne Gee, MD [facility] Pulmonary Critical Care Medicine Sleep Medicine

## 2020-10-03 ENCOUNTER — Other Ambulatory Visit (HOSPITAL_COMMUNITY): Payer: Medicare Other

## 2020-10-03 DIAGNOSIS — I469 Cardiac arrest, cause unspecified: Secondary | ICD-10-CM | POA: Diagnosis not present

## 2020-10-03 DIAGNOSIS — N186 End stage renal disease: Secondary | ICD-10-CM | POA: Diagnosis not present

## 2020-10-03 DIAGNOSIS — G9341 Metabolic encephalopathy: Secondary | ICD-10-CM | POA: Diagnosis not present

## 2020-10-03 DIAGNOSIS — J9621 Acute and chronic respiratory failure with hypoxia: Secondary | ICD-10-CM | POA: Diagnosis not present

## 2020-10-03 LAB — BODY FLUID CULTURE: Culture: NO GROWTH

## 2020-10-03 LAB — CBC
HCT: 25.5 % — ABNORMAL LOW (ref 39.0–52.0)
Hemoglobin: 8.1 g/dL — ABNORMAL LOW (ref 13.0–17.0)
MCH: 29.3 pg (ref 26.0–34.0)
MCHC: 31.8 g/dL (ref 30.0–36.0)
MCV: 92.4 fL (ref 80.0–100.0)
Platelets: 496 10*3/uL — ABNORMAL HIGH (ref 150–400)
RBC: 2.76 MIL/uL — ABNORMAL LOW (ref 4.22–5.81)
RDW: 22.5 % — ABNORMAL HIGH (ref 11.5–15.5)
WBC: 21.2 10*3/uL — ABNORMAL HIGH (ref 4.0–10.5)
nRBC: 0.2 % (ref 0.0–0.2)

## 2020-10-03 LAB — MAGNESIUM: Magnesium: 1.9 mg/dL (ref 1.7–2.4)

## 2020-10-03 LAB — RENAL FUNCTION PANEL
Albumin: 1.7 g/dL — ABNORMAL LOW (ref 3.5–5.0)
Anion gap: 12 (ref 5–15)
BUN: 21 mg/dL — ABNORMAL HIGH (ref 6–20)
CO2: 23 mmol/L (ref 22–32)
Calcium: 8.8 mg/dL — ABNORMAL LOW (ref 8.9–10.3)
Chloride: 102 mmol/L (ref 98–111)
Creatinine, Ser: 2.99 mg/dL — ABNORMAL HIGH (ref 0.61–1.24)
GFR, Estimated: 23 mL/min — ABNORMAL LOW (ref >60)
Glucose, Bld: 145 mg/dL — ABNORMAL HIGH (ref 70–99)
Phosphorus: 2.5 mg/dL (ref 2.5–4.6)
Potassium: 4 mmol/L (ref 3.5–5.1)
Sodium: 137 mmol/L (ref 135–145)

## 2020-10-03 NOTE — Progress Notes (Addendum)
Pulmonary Critical Care Medicine [place]   PULMONARY CRITICAL CARE SERVICE  PROGRESS NOTE  Date of Service: 10/03/2020  Jeremy Yu  IEP:[id number]  DOB: September 05, 1966   DOA: 09/22/2020  Referring Physician: Merton Border, MD  HPI: Jeremy Yu is a 54 y.o. male seen for follow up of Acute on Chronic Respiratory Failure.  Patient has a 12-hour goal today on aerosol trach collar currently on 20% FiO2 satting well no distress.  Medications: Reviewed on Rounds  Physical Exam:  Vitals: Pulse 98 respirations 20 BP 105/52 O2 sat 96% temp 99.3  Ventilator Settings not currently on ventilator  . General: Comfortable at this time . Eyes: Grossly normal lids, irises & conjunctiva . ENT: grossly tongue is normal . Neck: no obvious mass . Cardiovascular: S1 S2 normal no gallop . Respiratory: Coarse breath sounds . Abdomen: soft . Skin: no rash seen on limited exam . Musculoskeletal: not rigid . Psychiatric:unable to assess . Neurologic: no seizure no involuntary movements         Lab Data:   Basic Metabolic Panel: Recent Labs  Lab 09/27/20 0500 09/27/20 1415 09/28/20 6606 09/28/20 3016 09/29/20 0751 09/29/20 0751 09/30/20 0454 10/01/20 0326 10/01/20 1608 10/02/20 1244 10/03/20 0643  NA 134*  --  131*  --  133*  --   --  136  --  135 137  K 3.0*   < > 5.8*   < > 3.2*   < > 3.6 2.8* 3.5 3.0* 4.0  CL 100  --  99  --  100  --   --  101  --  101 102  CO2 20*  --  21*  --  22  --   --  24  --  22 23  GLUCOSE 186*  --  150*  --  137*  --   --  106*  --  156* 145*  BUN 34*  --  17  --  27*  --   --  23*  --  35* 21*  CREATININE 3.67*  --  2.74*  --  3.60*  --   --  3.43*  --  4.31* 2.99*  CALCIUM 8.5*  --  8.3*  --  8.5*  --   --  8.5*  --  8.5* 8.8*  MG 2.0  --  1.7  --  2.3  --   --  1.9  --   --  1.9  PHOS 2.2*  --   --   --  3.1  --   --   --   --  4.3 2.5   < > = values in this interval not displayed.    ABG: No results for input(s):  PHART, PCO2ART, PO2ART, HCO3, O2SAT in the last 168 hours.  Liver Function Tests: Recent Labs  Lab 09/27/20 0500 09/29/20 0751 10/02/20 1244 10/03/20 0643  ALBUMIN 1.1* 1.2* 1.3* 1.7*   No results for input(s): LIPASE, AMYLASE in the last 168 hours. No results for input(s): AMMONIA in the last 168 hours.  CBC: Recent Labs  Lab 09/27/20 0500 09/28/20 0632 09/29/20 0431 10/02/20 1244 10/03/20 0643  WBC 16.5* 14.4* 15.3* 21.8* 21.2*  HGB 6.9* 8.1* 8.3* 7.6* 8.1*  HCT 21.3* 26.3* 25.8* 23.8* 25.5*  MCV 89.9 90.7 89.9 91.2 92.4  PLT 549* 490* 578* 512* 496*    Cardiac Enzymes: No results for input(s): CKTOTAL, CKMB, CKMBINDEX, TROPONINI in the last 168 hours.  BNP (last 3 results) No results for input(s): BNP  in the last 8760 hours.  ProBNP (last 3 results) No results for input(s): PROBNP in the last 8760 hours.  Radiological Exams: DG Abd Portable 1V  Result Date: 10/02/2020 CLINICAL DATA:  Ileus. EXAM: PORTABLE ABDOMEN - 1 VIEW COMPARISON:  10/01/2020 FINDINGS: Mild gaseous distention of the stomach noted. No diffuse gaseous small bowel dilatation to suggest obstruction or ileus. Right upper quadrant drain again noted. Visualized bony anatomy unremarkable. IMPRESSION: Mild gaseous distention of the stomach. Otherwise stable. Electronically Signed   By: Misty Stanley M.D.   On: 10/02/2020 07:54    Assessment/Plan Active Problems:   Acute on chronic respiratory failure with hypoxia (HCC)   End stage renal failure on dialysis ([place])   Severe sepsis with septic shock (CODE) (HCC)   Cardiac arrest (HCC)   Metabolic encephalopathy   1. Acute on chronic respiratory failure hypoxia we will continue with T collar trials currently on 28% FiO2 maximum goal today is 12 hours 2. End-stage renal failure on hemodialysis we will continue to monitor 3. Severe sepsis with shock supportive care 4. Cardiac arrest rhythm stable 5. Metabolic encephalopathy no change   I have  personally seen and evaluated the patient, evaluated laboratory and imaging results, formulated the assessment and plan and placed orders. The Patient requires high complexity decision making with multiple systems involvement.  Rounds were done with the Respiratory Therapy Director and Staff therapists and discussed with nursing staff also.  Allyne Gee, MD [facility] Pulmonary Critical Care Medicine Sleep Medicine

## 2020-10-04 DIAGNOSIS — I469 Cardiac arrest, cause unspecified: Secondary | ICD-10-CM | POA: Diagnosis not present

## 2020-10-04 DIAGNOSIS — J9621 Acute and chronic respiratory failure with hypoxia: Secondary | ICD-10-CM | POA: Diagnosis not present

## 2020-10-04 DIAGNOSIS — N186 End stage renal disease: Secondary | ICD-10-CM | POA: Diagnosis not present

## 2020-10-04 DIAGNOSIS — G9341 Metabolic encephalopathy: Secondary | ICD-10-CM | POA: Diagnosis not present

## 2020-10-04 LAB — CBC WITH DIFFERENTIAL/PLATELET
Abs Immature Granulocytes: 0.29 10*3/uL — ABNORMAL HIGH (ref 0.00–0.07)
Basophils Absolute: 0.1 10*3/uL (ref 0.0–0.1)
Basophils Relative: 0 %
Eosinophils Absolute: 0.2 10*3/uL (ref 0.0–0.5)
Eosinophils Relative: 1 %
HCT: 25.5 % — ABNORMAL LOW (ref 39.0–52.0)
Hemoglobin: 7.9 g/dL — ABNORMAL LOW (ref 13.0–17.0)
Immature Granulocytes: 1 %
Lymphocytes Relative: 14 %
Lymphs Abs: 3 10*3/uL (ref 0.7–4.0)
MCH: 28.7 pg (ref 26.0–34.0)
MCHC: 31 g/dL (ref 30.0–36.0)
MCV: 92.7 fL (ref 80.0–100.0)
Monocytes Absolute: 0.9 10*3/uL (ref 0.1–1.0)
Monocytes Relative: 4 %
Neutro Abs: 17.4 10*3/uL — ABNORMAL HIGH (ref 1.7–7.7)
Neutrophils Relative %: 80 %
Platelets: 512 10*3/uL — ABNORMAL HIGH (ref 150–400)
RBC: 2.75 MIL/uL — ABNORMAL LOW (ref 4.22–5.81)
RDW: 22.4 % — ABNORMAL HIGH (ref 11.5–15.5)
WBC: 21.8 10*3/uL — ABNORMAL HIGH (ref 4.0–10.5)
nRBC: 0.2 % (ref 0.0–0.2)

## 2020-10-04 LAB — RENAL FUNCTION PANEL
Albumin: 1.5 g/dL — ABNORMAL LOW (ref 3.5–5.0)
Anion gap: 11 (ref 5–15)
BUN: 35 mg/dL — ABNORMAL HIGH (ref 6–20)
CO2: 22 mmol/L (ref 22–32)
Calcium: 8.8 mg/dL — ABNORMAL LOW (ref 8.9–10.3)
Chloride: 103 mmol/L (ref 98–111)
Creatinine, Ser: 3.83 mg/dL — ABNORMAL HIGH (ref 0.61–1.24)
GFR, Estimated: 17 mL/min — ABNORMAL LOW (ref >60)
Glucose, Bld: 182 mg/dL — ABNORMAL HIGH (ref 70–99)
Phosphorus: 2.9 mg/dL (ref 2.5–4.6)
Potassium: 3.6 mmol/L (ref 3.5–5.1)
Sodium: 136 mmol/L (ref 135–145)

## 2020-10-04 LAB — CBC
HCT: 25 % — ABNORMAL LOW (ref 39.0–52.0)
Hemoglobin: 7.4 g/dL — ABNORMAL LOW (ref 13.0–17.0)
MCH: 27.5 pg (ref 26.0–34.0)
MCHC: 29.6 g/dL — ABNORMAL LOW (ref 30.0–36.0)
MCV: 92.9 fL (ref 80.0–100.0)
Platelets: 489 10*3/uL — ABNORMAL HIGH (ref 150–400)
RBC: 2.69 MIL/uL — ABNORMAL LOW (ref 4.22–5.81)
RDW: 22.4 % — ABNORMAL HIGH (ref 11.5–15.5)
WBC: 22.9 10*3/uL — ABNORMAL HIGH (ref 4.0–10.5)
nRBC: 0.1 % (ref 0.0–0.2)

## 2020-10-04 LAB — C DIFFICILE (CDIFF) QUICK SCRN (NO PCR REFLEX)
C Diff antigen: NEGATIVE
C Diff interpretation: NOT DETECTED
C Diff toxin: NEGATIVE

## 2020-10-04 LAB — MAGNESIUM: Magnesium: 2 mg/dL (ref 1.7–2.4)

## 2020-10-04 NOTE — Progress Notes (Addendum)
Pulmonary Critical Care Medicine [place]   PULMONARY CRITICAL CARE SERVICE  PROGRESS NOTE  Date of Service: 10/04/2020  Jaquese Irving  AVW:[id number]  DOB: [birth year]   DOA: 09/22/2020  Referring Physician: Merton Border, MD  HPI: Jeremy Yu is a 54 y.o. male seen for follow up of Acute on Chronic Respiratory Failure.  Patient continues to wean on aerosol trach collar 28% FiO2 at this time for 16-hour goal satting well no distress.  Medications: Reviewed on Rounds  Physical Exam:  Vitals: Pulse 87 respirations 17 BP 111/65 O2 sat 100% temp 98.0  Ventilator Settings not currently on ventilator  . General: Comfortable at this time . Eyes: Grossly normal lids, irises & conjunctiva . ENT: grossly tongue is normal . Neck: no obvious mass . Cardiovascular: S1 S2 normal no gallop . Respiratory: No rales or rhonchi noted . Abdomen: soft . Skin: no rash seen on limited exam . Musculoskeletal: not rigid . Psychiatric:unable to assess . Neurologic: no seizure no involuntary movements         Lab Data:   Basic Metabolic Panel: Recent Labs  Lab 09/28/20 0632 09/28/20 5374 09/29/20 0751 09/30/20 0454 10/01/20 0326 10/01/20 1608 10/02/20 1244 10/03/20 0643 10/04/20 0534 10/04/20 0800  NA 131*   < > 133*  --  136  --  135 137 136  --   K 5.8*   < > 3.2*   < > 2.8* 3.5 3.0* 4.0 3.6  --   CL 99   < > 100  --  101  --  101 102 103  --   CO2 21*   < > 22  --  24  --  $R'22 23 22  'xn$ --   GLUCOSE 150*   < > 137*  --  106*  --  156* 145* 182*  --   BUN 17   < > 27*  --  23*  --  35* 21* 35*  --   CREATININE 2.74*   < > 3.60*  --  3.43*  --  4.31* 2.99* 3.83*  --   CALCIUM 8.3*   < > 8.5*  --  8.5*  --  8.5* 8.8* 8.8*  --   MG 1.7  --  2.3  --  1.9  --   --  1.9  --  2.0  PHOS  --   --  3.1  --   --   --  4.3 2.5 2.9  --    < > = values in this interval not displayed.    ABG: No results for input(s): PHART, PCO2ART, PO2ART, HCO3, O2SAT in the last 168  hours.  Liver Function Tests: Recent Labs  Lab 09/29/20 0751 10/02/20 1244 10/03/20 0643 10/04/20 0534  ALBUMIN 1.2* 1.3* 1.7* 1.5*   No results for input(s): LIPASE, AMYLASE in the last 168 hours. No results for input(s): AMMONIA in the last 168 hours.  CBC: Recent Labs  Lab 09/28/20 0632 09/29/20 0431 10/02/20 1244 10/03/20 0643 10/04/20 0534  WBC 14.4* 15.3* 21.8* 21.2* 22.9*  HGB 8.1* 8.3* 7.6* 8.1* 7.4*  HCT 26.3* 25.8* 23.8* 25.5* 25.0*  MCV 90.7 89.9 91.2 92.4 92.9  PLT 490* 578* 512* 496* 489*    Cardiac Enzymes: No results for input(s): CKTOTAL, CKMB, CKMBINDEX, TROPONINI in the last 168 hours.  BNP (last 3 results) No results for input(s): BNP in the last 8760 hours.  ProBNP (last 3 results) No results for input(s): PROBNP in the last 8760 hours.  Radiological Exams: CT ABDOMEN PELVIS WO CONTRAST  Result Date: 10/03/2020 CLINICAL DATA:  54 year old male with abdominal pain. Concern for perforation or peritonitis. EXAM: CT ABDOMEN AND PELVIS WITHOUT CONTRAST TECHNIQUE: Multidetector CT imaging of the abdomen and pelvis was performed following the standard protocol without IV contrast. COMPARISON:  CT abdomen pelvis dated 09/28/2020. FINDINGS: Evaluation of this exam is limited in the absence of intravenous contrast. Lower chest: There are small bilateral pleural effusions. Bilateral lower lobe partial consolidation, likely atelectasis. Rounded density in the lingula as seen previously most consistent with pneumonia. Clinical correlation is recommended. There is moderate pneumoperitoneum and ascites similar to prior CT. Slight higher attenuation of the ascitic fluid may represent complex content. Hepatobiliary: The liver is grossly unremarkable. There is a percutaneous cholecystostomy. Small gallstones noted. Pancreas: The pancreas is unremarkable as visualized. Spleen: Normal in size without focal abnormality. Adrenals/Urinary Tract: The adrenal glands unremarkable.  Mild bilateral renal parenchyma atrophy. Vascular calcification versus less likely small nonobstructing bilateral renal calculi measuring 2-3 mm. There is no hydronephrosis on either side. The visualized ureters and urinary bladder appear unremarkable. Stomach/Bowel: Percutaneous gastrostomy with balloon in the body of the stomach. 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 No evidence of bowel obstruction. Vascular/Lymphatic: Mild aortoiliac atherosclerotic disease. The IVC is grossly unremarkable. No portal venous gas. There is no adenopathy. Mesenteric vessel calcification. Reproductive: The prostate and seminal vesicles are grossly unremarkable. Other: Midline vertical anterior abdominal wall surgical incision. There is open wound along the superior aspect of the surgical incision. There is mild diffuse subcutaneous edema. Musculoskeletal: No acute or significant osseous findings. IMPRESSION: 1. Overall no significant interval change in the findings of the lower chest and abdomen compared to prior CT. 2. Moderate pneumoperitoneum and ascites similar to prior CT. 3. Percutaneous cholecystostomy. 4. 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 No evidence of bowel obstruction. 5. Small bilateral pleural effusions and bilateral lower lobe partial consolidation, likely atelectasis. Rounded density in the lingula as seen previously most consistent with pneumonia. Clinical correlation is recommended. 6. Aortic Atherosclerosis (ICD10-I70.0). Electronically Signed   By: Anner Crete M.D.   On: 10/03/2020 17:46    Assessment/Plan Active Problems:   Acute on chronic respiratory failure with hypoxia (HCC)   End stage renal failure on dialysis (HCC)   Severe sepsis with septic shock (CODE) (HCC)   Cardiac arrest (HCC)   Metabolic encephalopathy   1. Acute on chronic respiratory failure hypoxia patient will continue to wean on 28% aerosol trach  collar for goal of 16 hours today.  We will continue supportive measures and pulmonary toilet. 2. End-stage renal failure on hemodialysis we will continue to monitor 3. Severe sepsis with shock supportive care 4. Cardiac arrest rhythm stable 5. Metabolic encephalopathy no change   I have personally seen and evaluated the patient, evaluated laboratory and imaging results, formulated the assessment and plan and placed orders. The Patient requires high complexity decision making with multiple systems involvement.  Rounds were done with the Respiratory Therapy Director and Staff therapists and discussed with nursing staff also.  Allyne Gee, MD [facility] Pulmonary Critical Care Medicine Sleep Medicine

## 2020-10-04 NOTE — Progress Notes (Signed)
[facility]  ROUNDING NOTE   Subjective:  Patient seen and evaluated during hemodialysis treatment. Tolerating well. Does report some nausea today.  Objective:  Vital signs in last 24 hours:  Temperature 98 pulse 87 respiration 17 blood pressure 111/67   Physical Exam: General:  Chronically ill-appearing  Head:  Normocephalic, atraumatic. Moist oral mucosal membranes  Eyes:  Anicteric  Neck:  Tracheostomy in place  Lungs:   Coarse breath sounds bilateral, normal effort  Heart:  S1S2 no rubs  Abdomen:   Soft, nontender, bowel sounds present, PEG in place  Extremities:  Right AKA  Neurologic:  Awake, alert, following commands  Skin:  No lesions  Access:  Left upper extremity AV fistula    Basic Metabolic Panel: Recent Labs  Lab 09/28/20 0632 09/28/20 9450 09/29/20 0751 09/30/20 0454 10/01/20 0326 10/01/20 0326 10/01/20 1608 10/02/20 1244 10/03/20 0643 10/04/20 0534 10/04/20 0800  NA 131*   < > 133*  --  136  --   --  135 137 136  --   K 5.8*   < > 3.2*   < > 2.8*  --  3.5 3.0* 4.0 3.6  --   CL 99   < > 100  --  101  --   --  101 102 103  --   CO2 21*   < > 22  --  24  --   --  $R'22 23 22  'TO$ --   GLUCOSE 150*   < > 137*  --  106*  --   --  156* 145* 182*  --   BUN 17   < > 27*  --  23*  --   --  35* 21* 35*  --   CREATININE 2.74*   < > 3.60*  --  3.43*  --   --  4.31* 2.99* 3.83*  --   CALCIUM 8.3*   < > 8.5*  --  8.5*   < >  --  8.5* 8.8* 8.8*  --   MG 1.7  --  2.3  --  1.9  --   --   --  1.9  --  2.0  PHOS  --   --  3.1  --   --   --   --  4.3 2.5 2.9  --    < > = values in this interval not displayed.    Liver Function Tests: Recent Labs  Lab 09/29/20 0751 10/02/20 1244 10/03/20 0643 10/04/20 0534  ALBUMIN 1.2* 1.3* 1.7* 1.5*   No results for input(s): LIPASE, AMYLASE in the last 168 hours. No results for input(s): AMMONIA in the last 168 hours.  CBC: Recent Labs  Lab 09/28/20 0632 09/29/20 0431 10/02/20 1244 10/03/20 0643  10/04/20 0534  WBC 14.4* 15.3* 21.8* 21.2* 22.9*  HGB 8.1* 8.3* 7.6* 8.1* 7.4*  HCT 26.3* 25.8* 23.8* 25.5* 25.0*  MCV 90.7 89.9 91.2 92.4 92.9  PLT 490* 578* 512* 496* 489*    Cardiac Enzymes: No results for input(s): CKTOTAL, CKMB, CKMBINDEX, TROPONINI in the last 168 hours.  BNP: Invalid input(s): POCBNP  CBG: No results for input(s): GLUCAP in the last 168 hours.  Microbiology: Results for orders placed or performed during the hospital encounter of 09/22/20  Culture, respiratory (non-expectorated)     Status: None   Collection Time: 09/23/20 11:25 AM   Specimen: Tracheal Aspirate; Respiratory  Result Value Ref Range Status   Specimen Description TRACHEAL ASPIRATE  Final   Special Requests NONE  Final   Gram Stain   Final    RARE WBC PRESENT,BOTH PMN AND MONONUCLEAR NO ORGANISMS SEEN Performed at [facility], [place] [address], [place], [place] [address]    Culture FEW KLEBSIELLA PNEUMONIAE  Final   Report Status 09/25/2020 FINAL  Final   Organism ID, Bacteria KLEBSIELLA PNEUMONIAE  Final      Susceptibility   Klebsiella pneumoniae - MIC*    AMPICILLIN >=32 RESISTANT Resistant     CEFAZOLIN <=4 SENSITIVE Sensitive     CEFEPIME 0.25 SENSITIVE Sensitive     CEFTAZIDIME <=1 SENSITIVE Sensitive     CEFTRIAXONE 1 SENSITIVE Sensitive     CIPROFLOXACIN <=0.25 SENSITIVE Sensitive     GENTAMICIN <=1 SENSITIVE Sensitive     IMIPENEM <=0.25 SENSITIVE Sensitive     TRIMETH/SULFA <=20 SENSITIVE Sensitive     AMPICILLIN/SULBACTAM 16 INTERMEDIATE Intermediate     PIP/TAZO 64 INTERMEDIATE Intermediate     * FEW KLEBSIELLA PNEUMONIAE  Culture, blood (routine x 2)     Status: None   Collection Time: 09/23/20  4:26 PM   Specimen: BLOOD  Result Value Ref Range Status   Specimen Description BLOOD BLOOD RIGHT HAND  Final   Special Requests   Final    AEROBIC BOTTLE ONLY Blood Culture results may not be optimal due to an inadequate volume of blood received in culture bottles    Culture   Final    NO GROWTH 5 DAYS Performed at [facility], [address], [place], [place] [address]    Report Status 09/28/2020 FINAL  Final  Culture, blood (single)     Status: None   Collection Time: 09/24/20  6:00 AM   Specimen: BLOOD RIGHT HAND  Result Value Ref Range Status   Specimen Description BLOOD RIGHT HAND  Final   Special Requests   Final    BOTTLES DRAWN AEROBIC ONLY Blood Culture results may not be optimal due to an inadequate volume of blood received in culture bottles   Culture   Final    NO GROWTH 5 DAYS Performed at [facility], [place] [address] [place], [place] [address]    Report Status 09/29/2020 FINAL  Final  Body fluid culture     Status: None   Collection Time: 09/29/20 12:27 PM   Specimen: PATH Cytology Peritoneal fluid  Result Value Ref Range Status   Specimen Description PERITONEAL FLUID  Final   Special Requests NONE  Final   Gram Stain   Final    WBC PRESENT,BOTH PMN AND MONONUCLEAR NO ORGANISMS SEEN CYTOSPIN SMEAR    Culture   Final    NO GROWTH 3 DAYS Performed at [facility], [address] [place], [place] [address]    Report Status 10/03/2020 FINAL  Final    Coagulation Studies: No results for input(s): LABPROT, INR in the last 72 hours.  Urinalysis: No results for input(s): COLORURINE, LABSPEC, PHURINE, GLUCOSEU, HGBUR, BILIRUBINUR, KETONESUR, PROTEINUR, UROBILINOGEN, NITRITE, LEUKOCYTESUR in the last 72 hours.  Invalid input(s): APPERANCEUR    Imaging: CT ABDOMEN PELVIS WO CONTRAST  Result Date: 10/03/2020 CLINICAL DATA:  54 year old male with abdominal pain. Concern for perforation or peritonitis. EXAM: CT ABDOMEN AND PELVIS WITHOUT CONTRAST TECHNIQUE: Multidetector CT imaging of the abdomen and pelvis was performed following the standard protocol without IV contrast. COMPARISON:  CT abdomen pelvis dated 09/28/2020. FINDINGS: Evaluation of this exam is limited in the absence of intravenous contrast. Lower  chest: There are small bilateral  pleural effusions. Bilateral lower lobe partial consolidation, likely atelectasis. Rounded density in the lingula as seen previously most consistent with pneumonia. Clinical correlation is recommended. There is moderate pneumoperitoneum and ascites similar to prior CT. Slight higher attenuation of the ascitic fluid may represent complex content. Hepatobiliary: The liver is grossly unremarkable. There is a percutaneous cholecystostomy. Small gallstones noted. Pancreas: The pancreas is unremarkable as visualized. Spleen: Normal in size without focal abnormality. Adrenals/Urinary Tract: The adrenal glands unremarkable. Mild bilateral renal parenchyma atrophy. Vascular calcification versus less likely small nonobstructing bilateral renal calculi measuring 2-3 mm. There is no hydronephrosis on either side. The visualized ureters and urinary bladder appear unremarkable. Stomach/Bowel: Percutaneous gastrostomy with balloon in the body of the stomach. 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 No evidence of bowel obstruction. Vascular/Lymphatic: Mild aortoiliac atherosclerotic disease. The IVC is grossly unremarkable. No portal venous gas. There is no adenopathy. Mesenteric vessel calcification. Reproductive: The prostate and seminal vesicles are grossly unremarkable. Other: Midline vertical anterior abdominal wall surgical incision. There is open wound along the superior aspect of the surgical incision. There is mild diffuse subcutaneous edema. Musculoskeletal: No acute or significant osseous findings. IMPRESSION: 1. Overall no significant interval change in the findings of the lower chest and abdomen compared to prior CT. 2. Moderate pneumoperitoneum and ascites similar to prior CT. 3. Percutaneous cholecystostomy. 4. 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 No evidence of bowel obstruction. 5. Small bilateral pleural  effusions and bilateral lower lobe partial consolidation, likely atelectasis. Rounded density in the lingula as seen previously most consistent with pneumonia. Clinical correlation is recommended. 6. Aortic Atherosclerosis (ICD10-I70.0). Electronically Signed   By: Anner Crete M.D.   On: 10/03/2020 17:46     Medications:     iohexol, lidocaine  Assessment/ Plan:  54 y.o. 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   1.  ESRD on HD.  Patient seen and evaluated during dialysis treatment today.  Tolerating the treatment well.  We plan to complete dialysis treatment today.  2.  Hypotension.  Maintain use of midodrine and albumin.  3.  Anemia of chronic kidney disease.   Lab Results  Component Value Date   HGB 7.4 (L) 10/04/2020  Hemoglobin is dropped a bit down to 7.4.  Continue Retacrit.  Consider blood transfusion for hemoglobin of 7 or less.  4.  Secondary hyperparathyroidism.  Phosphorus currently 2.9 and acceptable.  5.  Acute respiratory failure.  Weaning as per pulmonary/critical care.    LOS: 0 Jeremy Yu 10/13/202110:30 AM

## 2020-10-05 ENCOUNTER — Other Ambulatory Visit (HOSPITAL_COMMUNITY): Payer: Medicare Other

## 2020-10-05 DIAGNOSIS — N186 End stage renal disease: Secondary | ICD-10-CM | POA: Diagnosis not present

## 2020-10-05 DIAGNOSIS — J9621 Acute and chronic respiratory failure with hypoxia: Secondary | ICD-10-CM | POA: Diagnosis not present

## 2020-10-05 DIAGNOSIS — G9341 Metabolic encephalopathy: Secondary | ICD-10-CM | POA: Diagnosis not present

## 2020-10-05 DIAGNOSIS — I469 Cardiac arrest, cause unspecified: Secondary | ICD-10-CM | POA: Diagnosis not present

## 2020-10-05 LAB — BLOOD CULTURE ID PANEL (REFLEXED) - BCID2

## 2020-10-05 LAB — CBC
HCT: 25.2 % — ABNORMAL LOW (ref 39.0–52.0)
HCT: 27.2 % — ABNORMAL LOW (ref 39.0–52.0)
Hemoglobin: 7.8 g/dL — ABNORMAL LOW (ref 13.0–17.0)
Hemoglobin: 8.4 g/dL — ABNORMAL LOW (ref 13.0–17.0)
MCH: 28.8 pg (ref 26.0–34.0)
MCH: 28.9 pg (ref 26.0–34.0)
MCHC: 31 g/dL (ref 30.0–36.0)
MCHC: 30.9 g/dL (ref 30.0–36.0)
MCV: 93 fL (ref 80.0–100.0)
MCV: 93.5 fL (ref 80.0–100.0)
Platelets: 523 10*3/uL — ABNORMAL HIGH (ref 150–400)
Platelets: 559 10*3/uL — ABNORMAL HIGH (ref 150–400)
RBC: 2.71 MIL/uL — ABNORMAL LOW (ref 4.22–5.81)
RBC: 2.91 MIL/uL — ABNORMAL LOW (ref 4.22–5.81)
RDW: 22.3 % — ABNORMAL HIGH (ref 11.5–15.5)
RDW: 22.3 % — ABNORMAL HIGH (ref 11.5–15.5)
WBC: 30.3 10*3/uL — ABNORMAL HIGH (ref 4.0–10.5)
WBC: 31.5 10*3/uL — ABNORMAL HIGH (ref 4.0–10.5)
nRBC: 0.1 % (ref 0.0–0.2)
nRBC: 0.2 % (ref 0.0–0.2)

## 2020-10-05 LAB — BLOOD GAS, ARTERIAL
Acid-base deficit: 4.9 mmol/L — ABNORMAL HIGH (ref 0.0–2.0)
Bicarbonate: 20.9 mmol/L (ref 20.0–28.0)
FIO2: 35
O2 Saturation: 87.6 %
Patient temperature: 35.9
pCO2 arterial: 44.3 mmHg (ref 32.0–48.0)
pH, Arterial: 7.288 — ABNORMAL LOW (ref 7.350–7.450)
pO2, Arterial: 55.4 mmHg — ABNORMAL LOW (ref 83.0–108.0)

## 2020-10-05 LAB — RENAL FUNCTION PANEL
Albumin: 2.1 g/dL — ABNORMAL LOW (ref 3.5–5.0)
Anion gap: 13 (ref 5–15)
BUN: 26 mg/dL — ABNORMAL HIGH (ref 6–20)
CO2: 21 mmol/L — ABNORMAL LOW (ref 22–32)
Calcium: 8.9 mg/dL (ref 8.9–10.3)
Chloride: 101 mmol/L (ref 98–111)
Creatinine, Ser: 3.12 mg/dL — ABNORMAL HIGH (ref 0.61–1.24)
GFR, Estimated: 21 mL/min — ABNORMAL LOW (ref >60)
Glucose, Bld: 307 mg/dL — ABNORMAL HIGH (ref 70–99)
Phosphorus: 3.3 mg/dL (ref 2.5–4.6)
Potassium: 3.6 mmol/L (ref 3.5–5.1)
Sodium: 135 mmol/L (ref 135–145)

## 2020-10-05 LAB — MAGNESIUM: Magnesium: 2 mg/dL (ref 1.7–2.4)

## 2020-10-05 MED FILL — Medication: Qty: 1 | Status: AC

## 2020-10-05 NOTE — Progress Notes (Addendum)
Pulmonary Critical Care Medicine [facility]   PULMONARY CRITICAL CARE SERVICE  PROGRESS NOTE  Date of Service: 10/05/2020  Jeremy Yu  AJG:[id number]  DOB: May 18, 1966   DOA: 09/22/2020  Referring Physician: Merton Border, MD  HPI: Jeremy Yu is a 54 y.o. male seen for follow up of Acute on Chronic Respiratory Failure.  Patient continues on pressure support at this time is weaning well.  Has a goal of 20 hours today.  Satting well this time no distress.  Medications: Reviewed on Rounds  Physical Exam:  Vitals: Pulse 107 respirations 28 BP 115/64 O2 sat 96% temp 96.5  Ventilator Settings pressure support 12/5 FiO2 35%  . General: Comfortable at this time . Eyes: Grossly normal lids, irises & conjunctiva . ENT: grossly tongue is normal . Neck: no obvious mass . Cardiovascular: S1 S2 normal no gallop . Respiratory: Coarse breath sounds . Abdomen: soft . Skin: no rash seen on limited exam . Musculoskeletal: not rigid . Psychiatric:unable to assess . Neurologic: no seizure no involuntary movements         Lab Data:   Basic Metabolic Panel: Recent Labs  Lab 09/29/20 0751 09/30/20 0454 10/01/20 0326 10/01/20 1608 10/02/20 1244 10/03/20 0643 10/04/20 0534 10/04/20 0800  NA 133*  --  136  --  135 137 136  --   K 3.2*   < > 2.8* 3.5 3.0* 4.0 3.6  --   CL 100  --  101  --  101 102 103  --   CO2 22  --  24  --  $R'22 23 22  'PT$ --   GLUCOSE 137*  --  106*  --  156* 145* 182*  --   BUN 27*  --  23*  --  35* 21* 35*  --   CREATININE 3.60*  --  3.43*  --  4.31* 2.99* 3.83*  --   CALCIUM 8.5*  --  8.5*  --  8.5* 8.8* 8.8*  --   MG 2.3  --  1.9  --   --  1.9  --  2.0  PHOS 3.1  --   --   --  4.3 2.5 2.9  --    < > = values in this interval not displayed.    ABG: No results for input(s): PHART, PCO2ART, PO2ART, HCO3, O2SAT in the last 168 hours.  Liver Function Tests: Recent Labs  Lab 09/29/20 0751 10/02/20 1244 10/03/20 0643 10/04/20 0534   ALBUMIN 1.2* 1.3* 1.7* 1.5*   No results for input(s): LIPASE, AMYLASE in the last 168 hours. No results for input(s): AMMONIA in the last 168 hours.  CBC: Recent Labs  Lab 10/02/20 1244 10/03/20 0643 10/04/20 0534 10/04/20 1250 10/05/20 0516  WBC 21.8* 21.2* 22.9* 21.8* 30.3*  NEUTROABS  --   --   --  17.4*  --   HGB 7.6* 8.1* 7.4* 7.9* 7.8*  HCT 23.8* 25.5* 25.0* 25.5* 25.2*  MCV 91.2 92.4 92.9 92.7 93.0  PLT 512* 496* 489* 512* 523*    Cardiac Enzymes: No results for input(s): CKTOTAL, CKMB, CKMBINDEX, TROPONINI in the last 168 hours.  BNP (last 3 results) No results for input(s): BNP in the last 8760 hours.  ProBNP (last 3 results) No results for input(s): PROBNP in the last 8760 hours.  Radiological Exams: CT ABDOMEN PELVIS WO CONTRAST  Result Date: 10/03/2020 CLINICAL DATA:  54 year old male with abdominal pain. Concern for perforation or peritonitis. EXAM: CT ABDOMEN AND PELVIS WITHOUT CONTRAST TECHNIQUE: Multidetector  CT imaging of the abdomen and pelvis was performed following the standard protocol without IV contrast. COMPARISON:  CT abdomen pelvis dated 09/28/2020. FINDINGS: Evaluation of this exam is limited in the absence of intravenous contrast. Lower chest: There are small bilateral pleural effusions. Bilateral lower lobe partial consolidation, likely atelectasis. Rounded density in the lingula as seen previously most consistent with pneumonia. Clinical correlation is recommended. There is moderate pneumoperitoneum and ascites similar to prior CT. Slight higher attenuation of the ascitic fluid may represent complex content. Hepatobiliary: The liver is grossly unremarkable. There is a percutaneous cholecystostomy. Small gallstones noted. Pancreas: The pancreas is unremarkable as visualized. Spleen: Normal in size without focal abnormality. Adrenals/Urinary Tract: The adrenal glands unremarkable. Mild bilateral renal parenchyma atrophy. Vascular calcification versus  less likely small nonobstructing bilateral renal calculi measuring 2-3 mm. There is no hydronephrosis on either side. The visualized ureters and urinary bladder appear unremarkable. Stomach/Bowel: Percutaneous gastrostomy with balloon in the body of the stomach. 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 No evidence of bowel obstruction. Vascular/Lymphatic: Mild aortoiliac atherosclerotic disease. The IVC is grossly unremarkable. No portal venous gas. There is no adenopathy. Mesenteric vessel calcification. Reproductive: The prostate and seminal vesicles are grossly unremarkable. Other: Midline vertical anterior abdominal wall surgical incision. There is open wound along the superior aspect of the surgical incision. There is mild diffuse subcutaneous edema. Musculoskeletal: No acute or significant osseous findings. IMPRESSION: 1. Overall no significant interval change in the findings of the lower chest and abdomen compared to prior CT. 2. Moderate pneumoperitoneum and ascites similar to prior CT. 3. Percutaneous cholecystostomy. 4. 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 No evidence of bowel obstruction. 5. Small bilateral pleural effusions and bilateral lower lobe partial consolidation, likely atelectasis. Rounded density in the lingula as seen previously most consistent with pneumonia. Clinical correlation is recommended. 6. Aortic Atherosclerosis (ICD10-I70.0). Electronically Signed   By: Anner Crete M.D.   On: 10/03/2020 17:46    Assessment/Plan Active Problems:   Acute on chronic respiratory failure with hypoxia (HCC)   End stage renal failure on dialysis (HCC)   Severe sepsis with septic shock (CODE) (HCC)   Cardiac arrest (HCC)   Metabolic encephalopathy   1. Acute on chronic respiratory failure hypoxia patient will continue on pressure support weaning for goal of 20 hours.  We will continue supportive measures and aggressive  pulmonary toilet. 2. End-stage renal failure on hemodialysis we will continue to monitor 3. Severe sepsis with shock supportive care 4. Cardiac arrest rhythm stable 5. Metabolic encephalopathy no change   I have personally seen and evaluated the patient, evaluated laboratory and imaging results, formulated the assessment and plan and placed orders. The Patient requires high complexity decision making with multiple systems involvement.  Rounds were done with the Respiratory Therapy Director and Staff therapists and discussed with nursing staff also.  Allyne Gee, MD [facility] Pulmonary Critical Care Medicine Sleep Medicine

## 2020-10-05 NOTE — Progress Notes (Signed)
PROGRESS NOTE    Jeremy Yu  KDX:[id number] DOB: Jun 30, 1966 DOA: 09/22/2020  Brief Narrative:  Jeremy Yu is an 54 y.o. male with medical history significant of end-stage renal disease on hemodialysis, diabetic nephropathy, chronic systolic and diastolic congestive heart failure, peripheral arterial disease, complete heart block, seizures, hyperlipidemia, hypertension, prior MI, GERD who had recent hospitalization from 07/17/2020 until 07/25/2020 for necrotizing soft tissue infection, sepsis.  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  He was discharged on 07/25/2020.  Patient apparently was at [facility] and rehab facility.  While undergoing dialysis on 08/20/2019 when he developed chills, rigors.  Blood cultures were taken and he was admitted to [facility] on 08/19/2020.  He was given IV vancomycin, ceftriaxone for probable sepsis.  He was found to have diarrhea and stool for C. difficile was positive.  Patient was placed on oral vancomycin.  CT on 08/20/2020 confirmed pancolitis with large left inguinal hernia, airspace disease with concern for pneumonia.  On 08/22/2019 when he became hypotensive, both IV and p.o. Flagyl was added to the p.o. vancomycin.  Infectious disease was consulted with addition of Dificid.  However, patient continued to worsen.  On 08/24/2019 when he was transferred urgently to the ICU and intubated for airway protection because he was becoming encephalopathic.  He was also given vasopressors for shock.  Fecal transplant was attempted by GI at the bedside however, inability to pass the scope due to large nonreducible inguinal hernia.  Surgery was consulted and patient was taken emergently for subtotal colectomy and ileostomy.  Eravacycline was added for C. difficile/toxic megacolon.   He was seen by surgery with clearance for tube feeds on 08/25/2020.  Patient postoperatively remained encephalopathic.  Gradually mental status improved.  However, he had significant drop in platelet count on 08/27/2020.  Hematology was consulted and they thought the thrombocytopenia was likely secondary to sepsis, recent surgery, critical illness, DIC.  He was given platelet transfusion with additional PRBC due to oozing from the surgical site, drop in hemoglobin levels.  He was also given vitamin K for ongoing liver dysfunction.  He apparently received total of 8 units platelets.  Repeat CT was done which showed left lingular consolidation, bilateral effusions.  Abdomen showed postop free fluid, gallstones.  He had progressive thrombocytopenia, abnormal LFTs therefore Flagyl, Eravacycline was stopped.  He had blood cultures on 08/28/2020 that showed staph.  He was started on IV vancomycin, Zosyn.  He was also treated with micafungin.  On 09/04/2020 patient had PEA arrest during which she was intubated, subsequently underwent EGD with evidence of bleeding ulcer, gastritis.  He continued to have oozing from his ileostomy and was receiving blood products.  He was placed on PPI drip.  Right upper quadrant ultrasound showed possible cholecystitis.  He was to undergo drain placement but underwent HIDA on 09/09/2020, gallbladder was not visualized.  On 09/11/2019 when he had large amount of blood from the stoma and received PRBC.  He had EGD with bleeding ulcer that was clipped.  On 09/12/2019 when he had paracentesis with 5.2 L removed.  Patient had fluid cultures grew VRE for which he was treated with daptomycin for 14 days.  He failed ventilator weaning efforts therefore tracheostomy was done on 09/14/2020.  He has unstageable sacral pressure ulcer for which he received wound care. He was transferred and admitted to [facility]  on 09/22/2020. -He has trach, on vent, on 35% FiO2.  After admission here he started  having increasing secretions.  Respiratory cultures showed Klebsiella.  He received treatment with ciprofloxacin, Flagyl. 10/05/2020: Patient however had worsening leukocytosis.  He was also hypotensive per the primary team and started on Levophed drip.  He underwent paracentesis last week with removal of 800 cc.  KUB done today per report progressive dilatation of the stomach and small bowel. Possible small bowel obstruction.  Preliminary blood cultures from 10/04/2020 showing gram-positive cocci in 1 bottle.   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 Ascites  End stage renal failure on dialysis  Necrotizing fasciitis status post right AKA Diabetes mellitus GIB with bleeding ulcer/gastritis Anemia Status post PEA arrest    Acute on chronic respiratory failure with hypoxemia/hypercapnia, ventilator dependent: Likely multifactorial etiology.  He had severe sepsis with septic shock at the acute facility.  He also had PEA arrest at the outside facility and had to be intubated.  He has pneumonia. Respiratory cultures from here showing Klebsiella.  He received treatment with ciprofloxacin, metronidazole.  However, now having worsening leukocytosis.  Chest x-ray per report persistent right lower lobe airspace disease with elevated right hemidiaphragm.  Unchanged.  Due to the worsening leukocytosis we will start him on meropenem, IV vancomycin empirically.  If his respite status is not improving or worsening would recommend to send for repeat respiratory cultures.Marland Kitchen  Respiratory cultures showed Klebsiella.  Pulmonary following.  Sepsis with shock: Patient was on pressors at  the outside facility.  Shock is resolved.  However, this afternoon patient became hypotensive and started on pressors again.  Preliminary blood cultures showing gram-positive cocci in from 1 bottle.  Final results still pending.  We will start him on empiric IV vancomycin, meropenem.  Given his severe C. difficile colitis we will also start him on IV Flagyl.  There is also concern for bowel obstruction on the KUB therefore CT has been ordered.  Once the bowel obstruction has been ruled out suggest to start p.o. vancomycin as well.   He previously had respiratory cultures respiratory cultures that showed Klebsiella for which she was already treated.  If his respiratory address is not improving or worsening would recommend to send for repeat cultures.  Leukocytosis: Patient having worsening leukocytosis.  KUB showing concern for bowel obstruction.  CT scan ordered.  He already received treatment with ciprofloxacin, Flagyl.  Will restart empiric antibiotics as mentioned above.  Preliminary blood culture showing gram-positive cocci in 1 bottle.  Follow-up on the final cultures and adjust antibiotics accordingly.  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  Currently has wound VAC place.  Continue local wound care.  Antibiotics as mentioned above.  Peritonitis: Patient had peritonitis at the acute facility with peritoneal fluid cultures that showed VRE.  He status post treatment with 14 days of daptomycin.  Previous CT of the abdomen and pelvis from here showed large volume ascites. He is status post paracentesis.  Gram stain showing WBC with PMN.  However, unable to find the cell count/differential on the fluid.  Cultures remain negative.  Now having worsening leukocytosis therefore restarting antibiotics as mentioned above.  Cholecystitis: He also had cholecystitis and is status  post drain placement.  Antibiotics as mentioned above.  End-stage renal disease  on dialysis: Dialysis per nephrology.  Antibiotics renally dosed.  Necrotizing fasciitis status post right AKA: Continue local wound care.  Diabetes mellitus: Continue to monitor Accu-Cheks, medications and management of diabetes per the primary team.  He will need proper glycemic control in order to enable healing.  Bleeding ulcer/gastritis: Status post EGD and clipping at the acute facility.  On PPI.  Continue to monitor.  Further management per primary team.  Thrombocytopenia: Patient had severe thrombocytopenia at the outside facility and received platelet transfusion.  Currently count improved. Continue to monitor platelets closely.  Anemia: He is status post PRBC.  Continue to monitor hemoglobin.  Further management per the primary team.  Unfortunately due to his complex medical problems he is high risk for worsening and decompensation.  Plan of care discussed with the patient, primary team and pharmacy.  Subjective: This afternoon patient became hypotensive and had to be started on pressors.  He is also having worsening leukocytosis.  KUB showing concern for bowel obstruction.  CT has been ordered.  He appears to be a little bit lethargic compared to previous.  Objective: Vitals: Temperature 98.9, pulse 79, respiratory rate 26, blood pressure 122/99, pulse oximetry 96%  Examination: Constitutional: Chronically ill-appearing male, appears a little bit more lethargic than previous, arousable Head: Atraumatic, normocephalic Eyes: pupils equal and reactive, legally blind  ENMT: external ears and nose appear normal, normal hearing, Lips appears normal, moist oral mucosa Neck: Has trach in place CVS: S1-S2  Respiratory: Rhonchi, no wheezing Abdomen: Distended but soft, nonspecific tenderness without guarding or rebound, wound VAC, right-sided drain, ostomy, PEG tube, diminished bowel sounds Musculoskeletal: Right lower extremity AKA Neuro: Legally blind, has debility with  generalized weakness Psych: stable mood Skin: no rashes     Data Reviewed: I have personally reviewed following labs and imaging studies  CBC: Recent Labs  Lab 10/03/20 0643 10/04/20 0534 10/04/20 1250 10/05/20 0516 10/05/20 1550  WBC 21.2* 22.9* 21.8* 30.3* 31.5*  NEUTROABS  --   --  17.4*  --   --   HGB 8.1* 7.4* 7.9* 7.8* 8.4*  HCT 25.5* 25.0* 25.5* 25.2* 27.2*  MCV 92.4 92.9 92.7 93.0 93.5  PLT 496* 489* 512* 523* 559*    Basic Metabolic Panel: Recent Labs  Lab 09/29/20 0751 09/30/20 0454 10/01/20 0326 10/01/20 0326 10/01/20 1608 10/02/20 1244 10/03/20 0643 10/04/20 0534 10/04/20 0800 10/05/20 1550  NA 133*  --  136  --   --  135 137 136  --  135  K 3.2*   < > 2.8*   < > 3.5 3.0* 4.0 3.6  --  3.6  CL 100  --  101  --   --  101 102 103  --  101  CO2 22  --  24  --   --  $R'22 23 22  'DK$ --  21*  GLUCOSE 137*  --  106*  --   --  156* 145* 182*  --  307*  BUN 27*  --  23*  --   --  35* 21* 35*  --  26*  CREATININE 3.60*  --  3.43*  --   --  4.31* 2.99* 3.83*  --  3.12*  CALCIUM 8.5*  --  8.5*  --   --  8.5* 8.8* 8.8*  --  8.9  MG 2.3  --  1.9  --   --   --  1.9  --  2.0 2.0  PHOS 3.1  --   --   --   --  4.3 2.5 2.9  --  3.3   < > = values in this interval not displayed.    GFR: CrCl cannot be calculated (Unknown ideal weight.).  Liver Function Tests: Recent Labs  Lab 09/29/20 0751 10/02/20 1244 10/03/20 0643 10/04/20 0534 10/05/20 1550  ALBUMIN 1.2* 1.3* 1.7* 1.5* 2.1*    CBG: No results for input(s): GLUCAP in the last 168 hours.   Recent Results (from the past 240 hour(s))  Body fluid culture     Status: None   Collection Time: 09/29/20 12:27 PM   Specimen: PATH Cytology Peritoneal fluid  Result Value Ref Range Status   Specimen Description PERITONEAL FLUID  Final   Special Requests NONE  Final   Gram Stain   Final    WBC PRESENT,BOTH PMN AND MONONUCLEAR NO ORGANISMS SEEN CYTOSPIN SMEAR    Culture   Final    NO GROWTH 3 DAYS Performed at  [facility], [address], [place], [place] [address]    Report Status 10/03/2020 FINAL  Final  Culture, blood (Routine X 2) w Reflex to ID Panel     Status: None (Preliminary result)   Collection Time: 10/04/20 12:50 PM   Specimen: BLOOD  Result Value Ref Range Status   Specimen Description BLOOD RIGHT ANTECUBITAL  Final   Special Requests   Final    BOTTLES DRAWN AEROBIC AND ANAEROBIC Blood Culture adequate volume   Culture  Setup Time   Final    GRAM POSITIVE COCCI IN CLUSTERS AEROBIC BOTTLE ONLY Organism ID to follow CRITICAL RESULT CALLED TO, READ BACK BY AND VERIFIED WITH: A. Grandville Silos RN 16:20 10/05/20 ([email or web address]) Performed at [facility], [place] [address] [place], [place] [address]    Culture GRAM POSITIVE COCCI  Final   Report Status PENDING  Incomplete  Blood Culture ID Panel (Reflexed)     Status: Abnormal   Collection Time: 10/04/20 12:50 PM  Result Value Ref Range Status   Enterococcus faecalis NOT DETECTED NOT DETECTED Final   Enterococcus Faecium NOT DETECTED NOT DETECTED Final   Listeria monocytogenes NOT DETECTED NOT DETECTED Final   Staphylococcus species DETECTED (A) NOT DETECTED Final    Comment: CRITICAL RESULT CALLED TO, READ BACK BY AND VERIFIED WITH: A. Grandville Silos RN 16:20 10/05/20 ([email or web address])    Staphylococcus aureus (BCID) NOT DETECTED NOT DETECTED Final   Staphylococcus epidermidis DETECTED (A) NOT DETECTED Final    Comment: Methicillin (oxacillin) resistant coagulase negative staphylococcus. Possible blood culture contaminant (unless isolated from more than one blood culture draw or clinical case suggests pathogenicity). No antibiotic treatment is indicated for blood  culture contaminants. CRITICAL RESULT CALLED TO, READ BACK BY AND VERIFIED WITH: A. Grandville Silos RN 16:20 10/05/20 ([email or web address])    Staphylococcus lugdunensis NOT DETECTED NOT DETECTED Final   Streptococcus species NOT DETECTED NOT DETECTED Final   Streptococcus agalactiae NOT DETECTED  NOT DETECTED Final   Streptococcus pneumoniae NOT DETECTED NOT DETECTED Final   Streptococcus pyogenes NOT DETECTED NOT DETECTED Final   A.calcoaceticus-baumannii NOT DETECTED NOT DETECTED Final   Bacteroides fragilis NOT DETECTED NOT DETECTED Final   Enterobacterales NOT DETECTED NOT DETECTED Final   Enterobacter cloacae complex NOT DETECTED NOT DETECTED Final   Escherichia coli NOT DETECTED NOT DETECTED Final   Klebsiella aerogenes NOT DETECTED NOT DETECTED Final   Klebsiella oxytoca NOT DETECTED NOT DETECTED Final   Klebsiella pneumoniae NOT DETECTED NOT  DETECTED Final   Proteus species NOT DETECTED NOT DETECTED Final   Salmonella species NOT DETECTED NOT DETECTED Final   Serratia marcescens NOT DETECTED NOT DETECTED Final   Haemophilus influenzae NOT DETECTED NOT DETECTED Final   Neisseria meningitidis NOT DETECTED NOT DETECTED Final   Pseudomonas aeruginosa NOT DETECTED NOT DETECTED Final   Stenotrophomonas maltophilia NOT DETECTED NOT DETECTED Final   Candida albicans NOT DETECTED NOT DETECTED Final   Candida auris NOT DETECTED NOT DETECTED Final   Candida glabrata NOT DETECTED NOT DETECTED Final   Candida krusei NOT DETECTED NOT DETECTED Final   Candida parapsilosis NOT DETECTED NOT DETECTED Final   Candida tropicalis NOT DETECTED NOT DETECTED Final   Cryptococcus neoformans/gattii NOT DETECTED NOT DETECTED Final   Methicillin resistance mecA/C DETECTED (A) NOT DETECTED Final    Comment: CRITICAL RESULT CALLED TO, READ BACK BY AND VERIFIED WITH: A. Grandville Silos RN 16:20 10/05/20 ([email or web address]) Performed at [facility], [place] [address], [place], [place] [address]   Culture, blood (Routine X 2) w Reflex to ID Panel     Status: None (Preliminary result)   Collection Time: 10/04/20 12:51 PM   Specimen: BLOOD RIGHT HAND  Result Value Ref Range Status   Specimen Description BLOOD RIGHT HAND  Final   Special Requests   Final    BOTTLES DRAWN AEROBIC AND ANAEROBIC Blood Culture  adequate volume   Culture   Final    NO GROWTH 1 DAY Performed at [facility], [place] [address] [place], [place] [address]    Report Status PENDING  Incomplete  C Difficile Quick Screen (NO PCR Reflex)     Status: None   Collection Time: 10/04/20  5:19 PM   Specimen: STOOL  Result Value Ref Range Status   C Diff antigen NEGATIVE NEGATIVE Final   C Diff toxin NEGATIVE NEGATIVE Final   C Diff interpretation No C. difficile detected.  Final    Comment: Performed at [facility], [place] [address], [place], [place] [address]      Radiology Studies: CT ABDOMEN PELVIS WO CONTRAST  Result Date: 10/03/2020 CLINICAL DATA:  54 year old male with abdominal pain. Concern for perforation or peritonitis. EXAM: CT ABDOMEN AND PELVIS WITHOUT CONTRAST TECHNIQUE: Multidetector CT imaging of the abdomen and pelvis was performed following the standard protocol without IV contrast. COMPARISON:  CT abdomen pelvis dated 09/28/2020. FINDINGS: Evaluation of this exam is limited in the absence of intravenous contrast. Lower chest: There are small bilateral pleural effusions. Bilateral lower lobe partial consolidation, likely atelectasis. Rounded density in the lingula as seen previously most consistent with pneumonia. Clinical correlation is recommended. There is moderate pneumoperitoneum and ascites similar to prior CT. Slight higher attenuation of the ascitic fluid may represent complex content. Hepatobiliary: The liver is grossly unremarkable. There is a percutaneous cholecystostomy. Small gallstones noted. Pancreas: The pancreas is unremarkable as visualized. Spleen: Normal in size without focal abnormality. Adrenals/Urinary Tract: The adrenal glands unremarkable. Mild bilateral renal parenchyma atrophy. Vascular calcification versus less likely small nonobstructing bilateral renal calculi measuring 2-3 mm. There is no hydronephrosis on either side. The visualized ureters and urinary bladder appear  unremarkable. Stomach/Bowel: Percutaneous gastrostomy with balloon in the body of the stomach. 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 No evidence of bowel obstruction. Vascular/Lymphatic: Mild aortoiliac atherosclerotic disease. The IVC is grossly unremarkable. No portal venous gas. There is no adenopathy. Mesenteric vessel calcification. Reproductive: The prostate and seminal vesicles are grossly unremarkable.  Other: Midline vertical anterior abdominal wall surgical incision. There is open wound along the superior aspect of the surgical incision. There is mild diffuse subcutaneous edema. Musculoskeletal: No acute or significant osseous findings. IMPRESSION: 1. Overall no significant interval change in the findings of the lower chest and abdomen compared to prior CT. 2. Moderate pneumoperitoneum and ascites similar to prior CT. 3. Percutaneous cholecystostomy. 4. 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 No evidence of bowel obstruction. 5. Small bilateral pleural effusions and bilateral lower lobe partial consolidation, likely atelectasis. Rounded density in the lingula as seen previously most consistent with pneumonia. Clinical correlation is recommended. 6. Aortic Atherosclerosis (ICD10-I70.0). Electronically Signed   By: Anner Crete M.D.   On: 10/03/2020 17:46   DG Abd 1 View  Result Date: 10/05/2020 CLINICAL DATA:  Unresponsive.  Ileus. EXAM: ABDOMEN - 1 VIEW COMPARISON:  10/02/2020 FINDINGS: Marked distention of stomach with progression. Distended small bowel also has progressed. Colon is nondilated. No air in the rectum. Gastrostomy tube overlying the stomach. Pigtail drainage catheter right upper quadrant, in the gallbladder based on prior CT. IMPRESSION: Progressive dilatation of stomach and small bowel. Possible small bowel obstruction rather than ileus. Electronically Signed   By: Franchot Gallo M.D.   On: 10/05/2020 13:14    DG CHEST PORT 1 VIEW  Result Date: 10/05/2020 CLINICAL DATA:  Unresponsive EXAM: PORTABLE CHEST 1 VIEW COMPARISON:  09/28/2020 FINDINGS: Tracheostomy remains in good position. Left jugular central venous catheter tip in the right atrium unchanged. Elevated right hemidiaphragm with right lower lobe airspace disease unchanged. Progression of airspace disease in the right mid and upper lobe. Small right effusion. Mild left lower lobe airspace disease also unchanged. Negative for heart failure. IMPRESSION: Progression of airspace disease in the right mid lung which could represent pneumonia. Persistent right lower lobe airspace disease and elevated right hemidiaphragm unchanged. Mild left lower lobe airspace disease Central venous catheter tip in the right atrium unchanged. Tracheostomy in good position. Electronically Signed   By: Franchot Gallo M.D.   On: 10/05/2020 13:11    Scheduled Meds: Please see MAR   Yaakov Guthrie, MD  10/05/2020, 5:17 PM

## 2020-10-06 ENCOUNTER — Other Ambulatory Visit (HOSPITAL_COMMUNITY): Payer: Medicare Other

## 2020-10-06 DIAGNOSIS — J9621 Acute and chronic respiratory failure with hypoxia: Secondary | ICD-10-CM | POA: Diagnosis not present

## 2020-10-06 DIAGNOSIS — G9341 Metabolic encephalopathy: Secondary | ICD-10-CM | POA: Diagnosis not present

## 2020-10-06 DIAGNOSIS — I469 Cardiac arrest, cause unspecified: Secondary | ICD-10-CM | POA: Diagnosis not present

## 2020-10-06 DIAGNOSIS — N186 End stage renal disease: Secondary | ICD-10-CM | POA: Diagnosis not present

## 2020-10-06 HISTORY — PX: IR PARACENTESIS: IMG2679

## 2020-10-06 LAB — COMPREHENSIVE METABOLIC PANEL
ALT: 14 U/L (ref 0–44)
AST: 53 U/L — ABNORMAL HIGH (ref 15–41)
Albumin: 1.7 g/dL — ABNORMAL LOW (ref 3.5–5.0)
Alkaline Phosphatase: 180 U/L — ABNORMAL HIGH (ref 38–126)
Anion gap: 13 (ref 5–15)
BUN: 32 mg/dL — ABNORMAL HIGH (ref 6–20)
CO2: 18 mmol/L — ABNORMAL LOW (ref 22–32)
Calcium: 8.8 mg/dL — ABNORMAL LOW (ref 8.9–10.3)
Chloride: 101 mmol/L (ref 98–111)
Creatinine, Ser: 3.44 mg/dL — ABNORMAL HIGH (ref 0.61–1.24)
GFR, Estimated: 19 mL/min — ABNORMAL LOW (ref >60)
Glucose, Bld: 326 mg/dL — ABNORMAL HIGH (ref 70–99)
Potassium: 3.4 mmol/L — ABNORMAL LOW (ref 3.5–5.1)
Sodium: 132 mmol/L — ABNORMAL LOW (ref 135–145)
Total Bilirubin: 4 mg/dL — ABNORMAL HIGH (ref 0.3–1.2)
Total Protein: 7.4 g/dL (ref 6.5–8.1)

## 2020-10-06 LAB — CBC
HCT: 22.9 % — ABNORMAL LOW (ref 39.0–52.0)
Hemoglobin: 7 g/dL — ABNORMAL LOW (ref 13.0–17.0)
MCH: 28.8 pg (ref 26.0–34.0)
MCHC: 30.6 g/dL (ref 30.0–36.0)
MCV: 94.2 fL (ref 80.0–100.0)
Platelets: 516 10*3/uL — ABNORMAL HIGH (ref 150–400)
RBC: 2.43 MIL/uL — ABNORMAL LOW (ref 4.22–5.81)
RDW: 22 % — ABNORMAL HIGH (ref 11.5–15.5)
WBC: 21.6 10*3/uL — ABNORMAL HIGH (ref 4.0–10.5)
nRBC: 0.3 % — ABNORMAL HIGH (ref 0.0–0.2)

## 2020-10-06 LAB — MAGNESIUM: Magnesium: 1.9 mg/dL (ref 1.7–2.4)

## 2020-10-06 LAB — PHOSPHORUS: Phosphorus: 2 mg/dL — ABNORMAL LOW (ref 2.5–4.6)

## 2020-10-06 LAB — PREPARE RBC (CROSSMATCH)

## 2020-10-06 MED ORDER — LIDOCAINE HCL 1 % IJ SOLN
INTRAMUSCULAR | Status: AC
  Filled 2020-10-06: qty 20

## 2020-10-06 NOTE — Procedures (Signed)
PROCEDURE SUMMARY:  Successful US guided paracentesis from left lateral abdomen.  Yielded 200 mL of dark fluid.  No immediate complications.  Pt tolerated well.   Specimen was not sent for labs.  EBL < 66mL  Docia Barrier PA-C 10/06/2020 4:03 PM

## 2020-10-06 NOTE — Progress Notes (Addendum)
Pulmonary Critical Care Medicine [place]   PULMONARY CRITICAL CARE SERVICE  PROGRESS NOTE  Date of Service: 10/06/2020  Jeremy Yu  GUY:[id number]  DOB: February 02, 1966   DOA: 09/22/2020  Referring Physician: Merton Border, MD  HPI: Jeremy Yu is a 54 y.o. male seen for follow up of Acute on Chronic Respiratory Failure.  Patient remains on full support at this time currently 35% FiO2 satting well.  Medications: Reviewed on Rounds  Physical Exam:  Vitals: Pulse 61 respirations 19 BP 107/58 O2 sat 98% temp 96.9  Ventilator Settings ventilator mode AC VC rate of 10 tidal volume 500 PEEP of 5 and FiO2 35%  . General: Comfortable at this time . Eyes: Grossly normal lids, irises & conjunctiva . ENT: grossly tongue is normal . Neck: no obvious mass . Cardiovascular: S1 S2 normal no gallop . Respiratory: Coarse breath sounds . Abdomen: soft . Skin: no rash seen on limited exam . Musculoskeletal: not rigid . Psychiatric:unable to assess . Neurologic: no seizure no involuntary movements         Lab Data:   Basic Metabolic Panel: Recent Labs  Lab 10/01/20 0326 10/01/20 1608 10/02/20 1244 10/03/20 0643 10/04/20 0534 10/04/20 0800 10/05/20 1550 10/06/20 0426  NA 136  --  135 137 136  --  135 132*  K 2.8*   < > 3.0* 4.0 3.6  --  3.6 3.4*  CL 101  --  101 102 103  --  101 101  CO2 24  --  $R'22 23 22  'xp$ --  21* 18*  GLUCOSE 106*  --  156* 145* 182*  --  307* 326*  BUN 23*  --  35* 21* 35*  --  26* 32*  CREATININE 3.43*  --  4.31* 2.99* 3.83*  --  3.12* 3.44*  CALCIUM 8.5*  --  8.5* 8.8* 8.8*  --  8.9 8.8*  MG 1.9  --   --  1.9  --  2.0 2.0 1.9  PHOS  --   --  4.3 2.5 2.9  --  3.3 2.0*   < > = values in this interval not displayed.    ABG: Recent Labs  Lab 10/05/20 1314  PHART 7.288*  PCO2ART 44.3  PO2ART 55.4*  HCO3 20.9  O2SAT 87.6    Liver Function Tests: Recent Labs  Lab 10/02/20 1244 10/03/20 0643 10/04/20 0534 10/05/20 1550  10/06/20 0426  AST  --   --   --   --  53*  ALT  --   --   --   --  14  ALKPHOS  --   --   --   --  180*  BILITOT  --   --   --   --  4.0*  PROT  --   --   --   --  7.4  ALBUMIN 1.3* 1.7* 1.5* 2.1* 1.7*   No results for input(s): LIPASE, AMYLASE in the last 168 hours. No results for input(s): AMMONIA in the last 168 hours.  CBC: Recent Labs  Lab 10/04/20 0534 10/04/20 1250 10/05/20 0516 10/05/20 1550 10/06/20 0845  WBC 22.9* 21.8* 30.3* 31.5* 21.6*  NEUTROABS  --  17.4*  --   --   --   HGB 7.4* 7.9* 7.8* 8.4* 7.0*  HCT 25.0* 25.5* 25.2* 27.2* 22.9*  MCV 92.9 92.7 93.0 93.5 94.2  PLT 489* 512* 523* 559* 516*    Cardiac Enzymes: No results for input(s): CKTOTAL, CKMB, CKMBINDEX, TROPONINI in the  last 168 hours.  BNP (last 3 results) No results for input(s): BNP in the last 8760 hours.  ProBNP (last 3 results) No results for input(s): PROBNP in the last 8760 hours.  Radiological Exams: CT ABDOMEN PELVIS WO CONTRAST  Result Date: 10/05/2020 CLINICAL DATA:  Abdominal pain.  Biliary obstruction suspected. EXAM: CT ABDOMEN AND PELVIS WITHOUT CONTRAST TECHNIQUE: Multidetector CT imaging of the abdomen and pelvis was performed following the standard protocol without IV contrast. COMPARISON:  10/03/2020 FINDINGS: Lower chest: There are worsening multifocal areas of consolidation at the lung bases. There is a dense opacity at the right lung base which may represent atelectasis. There are trace bilateral pleural effusions, right greater than left.The heart is enlarged. Hepatobiliary: The liver is normal. A transit patent cholecystostomy tube is again noted. The tube appears to be grossly well position. Gallstones are again noted.There is no biliary ductal dilation. Pancreas: Normal contours without ductal dilatation. No peripancreatic fluid collection. Spleen: Unremarkable. Adrenals/Urinary Tract: --Adrenal glands: Unremarkable. --Right kidney/ureter: Nonobstructing stones are noted.  There is no hydronephrosis. --Left kidney/ureter: Small stones versus vascular calcifications are noted. There is no hydronephrosis. --Urinary bladder: Unremarkable. Stomach/Bowel: --Stomach/Duodenum: The stomach is distended with contrast. There is an air-fluid level. The esophagus is distended with contrast. A well-positioned PEG tube is noted. Clips are again noted in the gastric body. --Small bowel: There is a right lower quadrant ostomy in place. No evidence for small bowel obstruction. --Colon: The patient is status post colectomy. --Appendix: Surgically absent. Vascular/Lymphatic: Atherosclerotic calcification is present within the non-aneurysmal abdominal aorta, without hemodynamically significant stenosis. --No retroperitoneal lymphadenopathy. --No mesenteric lymphadenopathy. --No pelvic or inguinal lymphadenopathy. Reproductive: Unremarkable Other: There is a large volume of ascites. There is a small volume of pneumoperitoneum, improved from prior study. Musculoskeletal. No acute displaced fractures. IMPRESSION: 1. Worsening multifocal areas of consolidation at the lung bases concerning for multifocal pneumonia or aspiration. 2. Trace bilateral pleural effusions, right greater than left. 3. Large volume of ascites. 4. Well-positioned cholecystostomy tube. 5. Distended stomach with an air-fluid level. The esophagus is distended with contrast. Findings may be secondary to gastroparesis or gastric outlet obstruction. The patient may benefit from NG tube placement. 6. Well-positioned PEG tube. 7. Bilateral nonobstructing nephrolithiasis. Aortic Atherosclerosis (ICD10-I70.0). Electronically Signed   By: Constance Holster M.D.   On: 10/05/2020 19:01   DG Abd 1 View  Result Date: 10/05/2020 CLINICAL DATA:  Unresponsive.  Ileus. EXAM: ABDOMEN - 1 VIEW COMPARISON:  10/02/2020 FINDINGS: Marked distention of stomach with progression. Distended small bowel also has progressed. Colon is nondilated. No air in the  rectum. Gastrostomy tube overlying the stomach. Pigtail drainage catheter right upper quadrant, in the gallbladder based on prior CT. IMPRESSION: Progressive dilatation of stomach and small bowel. Possible small bowel obstruction rather than ileus. Electronically Signed   By: Franchot Gallo M.D.   On: 10/05/2020 13:14   DG CHEST PORT 1 VIEW  Result Date: 10/05/2020 CLINICAL DATA:  Unresponsive EXAM: PORTABLE CHEST 1 VIEW COMPARISON:  09/28/2020 FINDINGS: Tracheostomy remains in good position. Left jugular central venous catheter tip in the right atrium unchanged. Elevated right hemidiaphragm with right lower lobe airspace disease unchanged. Progression of airspace disease in the right mid and upper lobe. Small right effusion. Mild left lower lobe airspace disease also unchanged. Negative for heart failure. IMPRESSION: Progression of airspace disease in the right mid lung which could represent pneumonia. Persistent right lower lobe airspace disease and elevated right hemidiaphragm unchanged. Mild left lower lobe airspace disease Central  venous catheter tip in the right atrium unchanged. Tracheostomy in good position. Electronically Signed   By: Franchot Gallo M.D.   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 Acute on chronic respiratory failure hypoxia  patient currently on full support see settings above.  Patient will continue to attempt to wean to pressure support at this time.  We will continue aggressive pulmonary toilet supportive measures. 2. End-stage renal failure on hemodialysis we will continue to monitor 3. Severe sepsis with shock supportive care 4. Cardiac arrest rhythm stable 5. Metabolic encephalopathy no change   I have personally seen and evaluated the patient,  evaluated laboratory and imaging results, formulated the assessment and plan and placed orders. The Patient requires high complexity decision making with multiple systems involvement.  Rounds were done with the Respiratory Therapy Director and Staff therapists and discussed with nursing staff also.  Allyne Gee, MD [facility] Pulmonary Critical Care Medicine Sleep Medicine

## 2020-10-06 NOTE — Progress Notes (Signed)
[facility]  ROUNDING NOTE   Subjective:  Patient seen and evaluated during HD.  Thus far tolerating well. Currently on low-dose pressors.  Objective:  Vital signs in last 24 hours:  Temperature 96.9 pulse 61 respirations 19 blood pressure 107/58   Physical Exam: General:  Chronically ill-appearing  Head:  Normocephalic, atraumatic. Moist oral mucosal membranes  Eyes:  Anicteric  Neck:  Tracheostomy in place  Lungs:   Coarse breath sounds bilateral, normal effort  Heart:  S1S2 no rubs  Abdomen:   Soft, nontender, bowel sounds present, PEG in place  Extremities:  Right AKA  Neurologic:  Awake, alert, following commands  Skin:  No lesions  Access:  Left upper extremity AV fistula    Basic Metabolic Panel: Recent Labs  Lab 10/01/20 0326 10/01/20 1608 10/02/20 1244 10/02/20 1244 10/03/20 0643 10/03/20 0643 10/04/20 0534 10/04/20 0800 10/05/20 1550 10/06/20 0426  NA 136  --  135  --  137  --  136  --  135 132*  K 2.8*   < > 3.0*  --  4.0  --  3.6  --  3.6 3.4*  CL 101  --  101  --  102  --  103  --  101 101  CO2 24  --  22  --  23  --  22  --  21* 18*  GLUCOSE 106*  --  156*  --  145*  --  182*  --  307* 326*  BUN 23*  --  35*  --  21*  --  35*  --  26* 32*  CREATININE 3.43*  --  4.31*  --  2.99*  --  3.83*  --  3.12* 3.44*  CALCIUM 8.5*  --  8.5*   < > 8.8*   < > 8.8*  --  8.9 8.8*  MG 1.9  --   --   --  1.9  --   --  2.0 2.0 1.9  PHOS  --   --  4.3  --  2.5  --  2.9  --  3.3 2.0*   < > = values in this interval not displayed.    Liver Function Tests: Recent Labs  Lab 10/02/20 1244 10/03/20 0643 10/04/20 0534 10/05/20 1550 10/06/20 0426  AST  --   --   --   --  53*  ALT  --   --   --   --  14  ALKPHOS  --   --   --   --  180*  BILITOT  --   --   --   --  4.0*  PROT  --   --   --   --  7.4  ALBUMIN 1.3* 1.7* 1.5* 2.1* 1.7*   No results for input(s): LIPASE, AMYLASE in the last 168 hours. No results for input(s): AMMONIA in the last 168  hours.  CBC: Recent Labs  Lab 10/03/20 0643 10/04/20 0534 10/04/20 1250 10/05/20 0516 10/05/20 1550  WBC 21.2* 22.9* 21.8* 30.3* 31.5*  NEUTROABS  --   --  17.4*  --   --   HGB 8.1* 7.4* 7.9* 7.8* 8.4*  HCT 25.5* 25.0* 25.5* 25.2* 27.2*  MCV 92.4 92.9 92.7 93.0 93.5  PLT 496* 489* 512* 523* 559*    Cardiac Enzymes: No results for input(s): CKTOTAL, CKMB, CKMBINDEX, TROPONINI in the last 168 hours.  BNP: Invalid input(s): POCBNP  CBG: No results for input(s): GLUCAP in the last 168 hours.  Microbiology: Results for orders placed or performed during the hospital encounter of 09/22/20  Culture, respiratory (non-expectorated)     Status: None   Collection Time: 09/23/20 11:25 AM   Specimen: Tracheal Aspirate; Respiratory  Result Value Ref Range Status   Specimen Description TRACHEAL ASPIRATE  Final   Special Requests NONE  Final   Gram Stain   Final    RARE WBC PRESENT,BOTH PMN AND MONONUCLEAR NO ORGANISMS SEEN Performed at [facility], [address], [place], [place] [address]    Culture FEW KLEBSIELLA PNEUMONIAE  Final   Report Status 09/25/2020 FINAL  Final   Organism ID, Bacteria KLEBSIELLA PNEUMONIAE  Final      Susceptibility   Klebsiella pneumoniae - MIC*    AMPICILLIN >=32 RESISTANT Resistant     CEFAZOLIN <=4 SENSITIVE Sensitive     CEFEPIME 0.25 SENSITIVE Sensitive     CEFTAZIDIME <=1 SENSITIVE Sensitive     CEFTRIAXONE 1 SENSITIVE Sensitive     CIPROFLOXACIN <=0.25 SENSITIVE Sensitive     GENTAMICIN <=1 SENSITIVE Sensitive     IMIPENEM <=0.25 SENSITIVE Sensitive     TRIMETH/SULFA <=20 SENSITIVE Sensitive     AMPICILLIN/SULBACTAM 16 INTERMEDIATE Intermediate     PIP/TAZO 64 INTERMEDIATE Intermediate     * FEW KLEBSIELLA PNEUMONIAE  Culture, blood (routine x 2)     Status: None   Collection Time: 09/23/20  4:26 PM   Specimen: BLOOD  Result Value Ref Range Status   Specimen Description BLOOD BLOOD RIGHT HAND  Final   Special Requests   Final     AEROBIC BOTTLE ONLY Blood Culture results may not be optimal due to an inadequate volume of blood received in culture bottles   Culture   Final    NO GROWTH 5 DAYS Performed at [facility], [address] [place], [place] [address]    Report Status 09/28/2020 FINAL  Final  Culture, blood (single)     Status: None   Collection Time: 09/24/20  6:00 AM   Specimen: BLOOD RIGHT HAND  Result Value Ref Range Status   Specimen Description BLOOD RIGHT HAND  Final   Special Requests   Final    BOTTLES DRAWN AEROBIC ONLY Blood Culture results may not be optimal due to an inadequate volume of blood received in culture bottles   Culture   Final    NO GROWTH 5 DAYS Performed at [facility], [place] [address], [place], [place] [address]    Report Status 09/29/2020 FINAL  Final  Body fluid culture     Status: None   Collection Time: 09/29/20 12:27 PM   Specimen: PATH Cytology Peritoneal fluid  Result Value Ref Range Status   Specimen Description PERITONEAL FLUID  Final   Special Requests NONE  Final   Gram Stain   Final    WBC PRESENT,BOTH PMN AND MONONUCLEAR NO ORGANISMS SEEN CYTOSPIN SMEAR    Culture   Final    NO GROWTH 3 DAYS Performed at [facility], [address] [place], [place] [address]    Report Status 10/03/2020 FINAL  Final  Culture, blood (Routine X 2) w Reflex to ID Panel     Status: None (Preliminary result)   Collection Time: 10/04/20 12:50 PM   Specimen: BLOOD  Result Value Ref Range Status   Specimen Description BLOOD RIGHT ANTECUBITAL  Final   Special Requests   Final    BOTTLES DRAWN AEROBIC AND ANAEROBIC Blood Culture adequate volume  Culture  Setup Time   Final    GRAM POSITIVE COCCI IN CLUSTERS AEROBIC BOTTLE ONLY Organism ID to follow CRITICAL RESULT CALLED TO, READ BACK BY AND VERIFIED WITH: A. [place] Silos RN 16:20 10/05/20 ([email or web address]) Performed at [facility], [place] [address], [place], [place] [address]    Culture GRAM POSITIVE  COCCI  Final   Report Status PENDING  Incomplete  Blood Culture ID Panel (Reflexed)     Status: Abnormal   Collection Time: 10/04/20 12:50 PM  Result Value Ref Range Status   Enterococcus faecalis NOT DETECTED NOT DETECTED Final   Enterococcus Faecium NOT DETECTED NOT DETECTED Final   Listeria monocytogenes NOT DETECTED NOT DETECTED Final   Staphylococcus species DETECTED (A) NOT DETECTED Final    Comment: CRITICAL RESULT CALLED TO, READ BACK BY AND VERIFIED WITH: A. [place] Silos RN 16:20 10/05/20 ([email or web address])    Staphylococcus aureus (BCID) NOT DETECTED NOT DETECTED Final   Staphylococcus epidermidis DETECTED (A) NOT DETECTED Final    Comment: Methicillin (oxacillin) resistant coagulase negative staphylococcus. Possible blood culture contaminant (unless isolated from more than one blood culture draw or clinical case suggests pathogenicity). No antibiotic treatment is indicated for blood  culture contaminants. CRITICAL RESULT CALLED TO, READ BACK BY AND VERIFIED WITH: A. [place] Silos RN 16:20 10/05/20 ([email or web address])    Staphylococcus lugdunensis NOT DETECTED NOT DETECTED Final   Streptococcus species NOT DETECTED NOT DETECTED Final   Streptococcus agalactiae NOT DETECTED NOT DETECTED Final   Streptococcus pneumoniae NOT DETECTED NOT DETECTED Final   Streptococcus pyogenes NOT DETECTED NOT DETECTED Final   A.calcoaceticus-baumannii NOT DETECTED NOT DETECTED Final   Bacteroides fragilis NOT DETECTED NOT DETECTED Final   Enterobacterales NOT DETECTED NOT DETECTED Final   Enterobacter cloacae complex NOT DETECTED NOT DETECTED Final   Escherichia coli NOT DETECTED NOT DETECTED Final   Klebsiella aerogenes NOT DETECTED NOT DETECTED Final   Klebsiella oxytoca NOT DETECTED NOT DETECTED Final   Klebsiella pneumoniae NOT DETECTED NOT DETECTED Final   Proteus species NOT DETECTED NOT DETECTED Final   Salmonella species NOT DETECTED NOT DETECTED Final   Serratia marcescens NOT DETECTED NOT DETECTED Final    Haemophilus influenzae NOT DETECTED NOT DETECTED Final   Neisseria meningitidis NOT DETECTED NOT DETECTED Final   Pseudomonas aeruginosa NOT DETECTED NOT DETECTED Final   Stenotrophomonas maltophilia NOT DETECTED NOT DETECTED Final   Candida albicans NOT DETECTED NOT DETECTED Final   Candida auris NOT DETECTED NOT DETECTED Final   Candida glabrata NOT DETECTED NOT DETECTED Final   Candida krusei NOT DETECTED NOT DETECTED Final   Candida parapsilosis NOT DETECTED NOT DETECTED Final   Candida tropicalis NOT DETECTED NOT DETECTED Final   Cryptococcus neoformans/gattii NOT DETECTED NOT DETECTED Final   Methicillin resistance mecA/C DETECTED (A) NOT DETECTED Final    Comment: CRITICAL RESULT CALLED TO, READ BACK BY AND VERIFIED WITH: A. [place] Silos RN 16:20 10/05/20 ([email or web address]) Performed at [facility], [place] [address], [place], [place] [address]   Culture, blood (Routine X 2) w Reflex to ID Panel     Status: None (Preliminary result)   Collection Time: 10/04/20 12:51 PM   Specimen: BLOOD RIGHT HAND  Result Value Ref Range Status   Specimen Description BLOOD RIGHT HAND  Final   Special Requests   Final    BOTTLES DRAWN AEROBIC AND ANAEROBIC Blood Culture adequate volume   Culture  Setup Time   Final    GRAM POSITIVE COCCI AEROBIC BOTTLE ONLY CRITICAL VALUE NOTED.  VALUE  IS CONSISTENT WITH PREVIOUSLY REPORTED AND CALLED VALUE. Performed at [facility], [place] [address] [place], [place] [address]    Culture GRAM POSITIVE COCCI  Final   Report Status PENDING  Incomplete  C Difficile Quick Screen (NO PCR Reflex)     Status: None   Collection Time: 10/04/20  5:19 PM   Specimen: STOOL  Result Value Ref Range Status   C Diff antigen NEGATIVE NEGATIVE Final   C Diff toxin NEGATIVE NEGATIVE Final   C Diff interpretation No C. difficile detected.  Final    Comment: Performed at [facility], [place] [address] [place], [place] [address]  Culture, respiratory (non-expectorated)      Status: None (Preliminary result)   Collection Time: 10/05/20 10:14 PM   Specimen: Tracheal Aspirate; Respiratory  Result Value Ref Range Status   Specimen Description TRACHEAL ASPIRATE  Final   Special Requests NONE  Final   Gram Stain   Final    MODERATE WBC PRESENT, PREDOMINANTLY PMN MODERATE GRAM NEGATIVE RODS FEW YEAST RARE GRAM POSITIVE COCCI IN CHAINS Performed at [facility], [address] [place], [place] [address]    Culture PENDING  Incomplete   Report Status PENDING  Incomplete    Coagulation Studies: No results for input(s): LABPROT, INR in the last 72 hours.  Urinalysis: No results for input(s): COLORURINE, LABSPEC, PHURINE, GLUCOSEU, HGBUR, BILIRUBINUR, KETONESUR, PROTEINUR, UROBILINOGEN, NITRITE, LEUKOCYTESUR in the last 72 hours.  Invalid input(s): APPERANCEUR    Imaging: CT ABDOMEN PELVIS WO CONTRAST  Result Date: 10/05/2020 CLINICAL DATA:  Abdominal pain.  Biliary obstruction suspected. EXAM: CT ABDOMEN AND PELVIS WITHOUT CONTRAST TECHNIQUE: Multidetector CT imaging of the abdomen and pelvis was performed following the standard protocol without IV contrast. COMPARISON:  10/03/2020 FINDINGS: Lower chest: There are worsening multifocal areas of consolidation at the lung bases. There is a dense opacity at the right lung base which may represent atelectasis. There are trace bilateral pleural effusions, right greater than left.The heart is enlarged. Hepatobiliary: The liver is normal. A transit patent cholecystostomy tube is again noted. The tube appears to be grossly well position. Gallstones are again noted.There is no biliary ductal dilation. Pancreas: Normal contours without ductal dilatation. No peripancreatic fluid collection. Spleen: Unremarkable. Adrenals/Urinary Tract: --Adrenal glands: Unremarkable. --Right kidney/ureter: Nonobstructing stones are noted. There is no hydronephrosis. --Left kidney/ureter: Small stones versus vascular calcifications are  noted. There is no hydronephrosis. --Urinary bladder: Unremarkable. Stomach/Bowel: --Stomach/Duodenum: The stomach is distended with contrast. There is an air-fluid level. The esophagus is distended with contrast. A well-positioned PEG tube is noted. Clips are again noted in the gastric body. --Small bowel: There is a right lower quadrant ostomy in place. No evidence for small bowel obstruction. --Colon: The patient is status post colectomy. --Appendix: Surgically absent. Vascular/Lymphatic: Atherosclerotic calcification is present within the non-aneurysmal abdominal aorta, without hemodynamically significant stenosis. --No retroperitoneal lymphadenopathy. --No mesenteric lymphadenopathy. --No pelvic or inguinal lymphadenopathy. Reproductive: Unremarkable Other: There is a large volume of ascites. There is a small volume of pneumoperitoneum, improved from prior study. Musculoskeletal. No acute displaced fractures. IMPRESSION: 1. Worsening multifocal areas of consolidation at the lung bases concerning for multifocal pneumonia or aspiration. 2. Trace bilateral pleural effusions, right greater than left. 3. Large volume of ascites. 4. Well-positioned cholecystostomy tube. 5. Distended stomach with an air-fluid level. The esophagus is distended with contrast. Findings may be secondary to gastroparesis or gastric outlet obstruction. The patient may benefit from NG tube placement. 6. Well-positioned  PEG tube. 7. Bilateral nonobstructing nephrolithiasis. Aortic Atherosclerosis (ICD10-I70.0). Electronically Signed   By: Constance Holster M.D.   On: 10/05/2020 19:01   DG Abd 1 View  Result Date: 10/05/2020 CLINICAL DATA:  Unresponsive.  Ileus. EXAM: ABDOMEN - 1 VIEW COMPARISON:  10/02/2020 FINDINGS: Marked distention of stomach with progression. Distended small bowel also has progressed. Colon is nondilated. No air in the rectum. Gastrostomy tube overlying the stomach. Pigtail drainage catheter right upper quadrant, in  the gallbladder based on prior CT. IMPRESSION: Progressive dilatation of stomach and small bowel. Possible small bowel obstruction rather than ileus. Electronically Signed   By: Franchot Gallo M.D.   On: 10/05/2020 13:14   DG CHEST PORT 1 VIEW  Result Date: 10/05/2020 CLINICAL DATA:  Unresponsive EXAM: PORTABLE CHEST 1 VIEW COMPARISON:  09/28/2020 FINDINGS: Tracheostomy remains in good position. Left jugular central venous catheter tip in the right atrium unchanged. Elevated right hemidiaphragm with right lower lobe airspace disease unchanged. Progression of airspace disease in the right mid and upper lobe. Small right effusion. Mild left lower lobe airspace disease also unchanged. Negative for heart failure. IMPRESSION: Progression of airspace disease in the right mid lung which could represent pneumonia. Persistent right lower lobe airspace disease and elevated right hemidiaphragm unchanged. Mild left lower lobe airspace disease Central venous catheter tip in the right atrium unchanged. Tracheostomy in good position. Electronically Signed   By: Franchot Gallo M.D.   On: 10/05/2020 13:11     Medications:     iohexol, lidocaine  Assessment/ Plan:  54 y.o. 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   1.  ESRD on HD.  Patient undergoing dialysis treatment.  We plan to complete dialysis treatment today.  Ultrafiltration target 2 kg.  2.  Hypotension.  Albumin is available for use during dialysis treatments.  Also continue patient on midodrine.  3.  Anemia of  chronic kidney disease.   Lab Results  Component Value Date   HGB 8.4 (L) 10/05/2020  Hemoglobin up to 8.4.  Maintain the patient on Retacrit.  4.  Secondary hyperparathyroidism.  Phosphorus at last check was 2.0.  5.  Acute respiratory failure.  Maintained on ventilatory support.  FiO2 currently 35%.    LOS: 0 Yajaira Doffing 10/15/20217:51 AM

## 2020-10-07 ENCOUNTER — Other Ambulatory Visit (HOSPITAL_COMMUNITY): Payer: Medicare Other

## 2020-10-07 DIAGNOSIS — I469 Cardiac arrest, cause unspecified: Secondary | ICD-10-CM | POA: Diagnosis not present

## 2020-10-07 DIAGNOSIS — J9621 Acute and chronic respiratory failure with hypoxia: Secondary | ICD-10-CM | POA: Diagnosis not present

## 2020-10-07 DIAGNOSIS — N186 End stage renal disease: Secondary | ICD-10-CM | POA: Diagnosis not present

## 2020-10-07 DIAGNOSIS — G9341 Metabolic encephalopathy: Secondary | ICD-10-CM | POA: Diagnosis not present

## 2020-10-07 LAB — TYPE AND SCREEN
ABO/RH(D): O POS
Antibody Screen: NEGATIVE
Unit division: 0

## 2020-10-07 LAB — CBC
HCT: 26.8 % — ABNORMAL LOW (ref 39.0–52.0)
Hemoglobin: 8.4 g/dL — ABNORMAL LOW (ref 13.0–17.0)
MCH: 28.4 pg (ref 26.0–34.0)
MCHC: 31.3 g/dL (ref 30.0–36.0)
MCV: 90.5 fL (ref 80.0–100.0)
Platelets: 484 10*3/uL — ABNORMAL HIGH (ref 150–400)
RBC: 2.96 MIL/uL — ABNORMAL LOW (ref 4.22–5.81)
RDW: 20.9 % — ABNORMAL HIGH (ref 11.5–15.5)
WBC: 20.2 10*3/uL — ABNORMAL HIGH (ref 4.0–10.5)
nRBC: 1 % — ABNORMAL HIGH (ref 0.0–0.2)

## 2020-10-07 LAB — BPAM RBC
Blood Product Expiration Date: 202111192359
ISSUE DATE / TIME: 202110151631
Unit Type and Rh: 5100

## 2020-10-07 LAB — CULTURE, BLOOD (ROUTINE X 2)
Special Requests: ADEQUATE
Special Requests: ADEQUATE

## 2020-10-07 NOTE — Progress Notes (Signed)
Pulmonary Critical Care Medicine [place]   PULMONARY CRITICAL CARE SERVICE  PROGRESS NOTE  Date of Service: 10/07/2020  Jet Armbrust  GBT:[id number]  DOB: 07-Mar-1966   DOA: 09/22/2020  Referring Physician: Merton Border, MD  HPI: Jeremy Yu is a 54 y.o. male seen for follow up of Acute on Chronic Respiratory Failure.  Patient at this time is on full support on the ventilator.  Mechanics have been poor patient not been tolerating weaning today  Medications: Reviewed on Rounds  Physical Exam:  Vitals: Temperature is 97.4 pulse 92 respiratory 24 blood pressure is 129/72 saturations 100%  Ventilator Settings on assist control FiO2 is 35% respiratory rate 10 tidal volume is 500  . General: Comfortable at this time . Eyes: Grossly normal lids, irises & conjunctiva . ENT: grossly tongue is normal . Neck: no obvious mass . Cardiovascular: S1 S2 normal no gallop . Respiratory: No rhonchi very coarse breath sounds . Abdomen: soft . Skin: no rash seen on limited exam . Musculoskeletal: not rigid . Psychiatric:unable to assess . Neurologic: no seizure no involuntary movements         Lab Data:   Basic Metabolic Panel: Recent Labs  Lab 10/01/20 0326 10/01/20 1608 10/02/20 1244 10/03/20 0643 10/04/20 0534 10/04/20 0800 10/05/20 1550 10/06/20 0426  NA 136  --  135 137 136  --  135 132*  K 2.8*   < > 3.0* 4.0 3.6  --  3.6 3.4*  CL 101  --  101 102 103  --  101 101  CO2 24  --  $R'22 23 22  'tm$ --  21* 18*  GLUCOSE 106*  --  156* 145* 182*  --  307* 326*  BUN 23*  --  35* 21* 35*  --  26* 32*  CREATININE 3.43*  --  4.31* 2.99* 3.83*  --  3.12* 3.44*  CALCIUM 8.5*  --  8.5* 8.8* 8.8*  --  8.9 8.8*  MG 1.9  --   --  1.9  --  2.0 2.0 1.9  PHOS  --   --  4.3 2.5 2.9  --  3.3 2.0*   < > = values in this interval not displayed.    ABG: Recent Labs  Lab 10/05/20 1314  PHART 7.288*  PCO2ART 44.3  PO2ART 55.4*  HCO3 20.9  O2SAT 87.6    Liver  Function Tests: Recent Labs  Lab 10/02/20 1244 10/03/20 0643 10/04/20 0534 10/05/20 1550 10/06/20 0426  AST  --   --   --   --  53*  ALT  --   --   --   --  14  ALKPHOS  --   --   --   --  180*  BILITOT  --   --   --   --  4.0*  PROT  --   --   --   --  7.4  ALBUMIN 1.3* 1.7* 1.5* 2.1* 1.7*   No results for input(s): LIPASE, AMYLASE in the last 168 hours. No results for input(s): AMMONIA in the last 168 hours.  CBC: Recent Labs  Lab 10/04/20 1250 10/05/20 0516 10/05/20 1550 10/06/20 0845 10/07/20 0525  WBC 21.8* 30.3* 31.5* 21.6* 20.2*  NEUTROABS 17.4*  --   --   --   --   HGB 7.9* 7.8* 8.4* 7.0* 8.4*  HCT 25.5* 25.2* 27.2* 22.9* 26.8*  MCV 92.7 93.0 93.5 94.2 90.5  PLT 512* 523* 559* 516* 484*    Cardiac  Enzymes: No results for input(s): CKTOTAL, CKMB, CKMBINDEX, TROPONINI in the last 168 hours.  BNP (last 3 results) No results for input(s): BNP in the last 8760 hours.  ProBNP (last 3 results) No results for input(s): PROBNP in the last 8760 hours.  Radiological Exams: CT ABDOMEN PELVIS WO CONTRAST  Result Date: 10/05/2020 CLINICAL DATA:  Abdominal pain.  Biliary obstruction suspected. EXAM: CT ABDOMEN AND PELVIS WITHOUT CONTRAST TECHNIQUE: Multidetector CT imaging of the abdomen and pelvis was performed following the standard protocol without IV contrast. COMPARISON:  10/03/2020 FINDINGS: Lower chest: There are worsening multifocal areas of consolidation at the lung bases. There is a dense opacity at the right lung base which may represent atelectasis. There are trace bilateral pleural effusions, right greater than left.The heart is enlarged. Hepatobiliary: The liver is normal. A transit patent cholecystostomy tube is again noted. The tube appears to be grossly well position. Gallstones are again noted.There is no biliary ductal dilation. Pancreas: Normal contours without ductal dilatation. No peripancreatic fluid collection. Spleen: Unremarkable. Adrenals/Urinary  Tract: --Adrenal glands: Unremarkable. --Right kidney/ureter: Nonobstructing stones are noted. There is no hydronephrosis. --Left kidney/ureter: Small stones versus vascular calcifications are noted. There is no hydronephrosis. --Urinary bladder: Unremarkable. Stomach/Bowel: --Stomach/Duodenum: The stomach is distended with contrast. There is an air-fluid level. The esophagus is distended with contrast. A well-positioned PEG tube is noted. Clips are again noted in the gastric body. --Small bowel: There is a right lower quadrant ostomy in place. No evidence for small bowel obstruction. --Colon: The patient is status post colectomy. --Appendix: Surgically absent. Vascular/Lymphatic: Atherosclerotic calcification is present within the non-aneurysmal abdominal aorta, without hemodynamically significant stenosis. --No retroperitoneal lymphadenopathy. --No mesenteric lymphadenopathy. --No pelvic or inguinal lymphadenopathy. Reproductive: Unremarkable Other: There is a large volume of ascites. There is a small volume of pneumoperitoneum, improved from prior study. Musculoskeletal. No acute displaced fractures. IMPRESSION: 1. Worsening multifocal areas of consolidation at the lung bases concerning for multifocal pneumonia or aspiration. 2. Trace bilateral pleural effusions, right greater than left. 3. Large volume of ascites. 4. Well-positioned cholecystostomy tube. 5. Distended stomach with an air-fluid level. The esophagus is distended with contrast. Findings may be secondary to gastroparesis or gastric outlet obstruction. The patient may benefit from NG tube placement. 6. Well-positioned PEG tube. 7. Bilateral nonobstructing nephrolithiasis. Aortic Atherosclerosis (ICD10-I70.0). Electronically Signed   By: Constance Holster M.D.   On: 10/05/2020 19:01   DG Abd 1 View  Result Date: 10/05/2020 CLINICAL DATA:  Unresponsive.  Ileus. EXAM: ABDOMEN - 1 VIEW COMPARISON:  10/02/2020 FINDINGS: Marked distention of stomach  with progression. Distended small bowel also has progressed. Colon is nondilated. No air in the rectum. Gastrostomy tube overlying the stomach. Pigtail drainage catheter right upper quadrant, in the gallbladder based on prior CT. IMPRESSION: Progressive dilatation of stomach and small bowel. Possible small bowel obstruction rather than ileus. Electronically Signed   By: Franchot Gallo M.D.   On: 10/05/2020 13:14   DG CHEST PORT 1 VIEW  Result Date: 10/06/2020 CLINICAL DATA:  Pneumonia. EXAM: PORTABLE CHEST 1 VIEW COMPARISON:  October 05, 2020. FINDINGS: Stable cardiomediastinal silhouette. Tracheostomy tube is unchanged in position. Left-sided PICC line is unchanged in position. No pneumothorax is noted. Mild right pleural effusion is noted with associated right basilar atelectasis or infiltrate. Patchy opacities are noted in the right upper lobe and left lower lobe concerning for multifocal pneumonia. Bony thorax is unremarkable. IMPRESSION: No active disease. Electronically Signed   By: Marijo Conception M.D.   On:  10/06/2020 11:50   DG CHEST PORT 1 VIEW  Result Date: 10/05/2020 CLINICAL DATA:  Unresponsive EXAM: PORTABLE CHEST 1 VIEW COMPARISON:  09/28/2020 FINDINGS: Tracheostomy remains in good position. Left jugular central venous catheter tip in the right atrium unchanged. Elevated right hemidiaphragm with right lower lobe airspace disease unchanged. Progression of airspace disease in the right mid and upper lobe. Small right effusion. Mild left lower lobe airspace disease also unchanged. Negative for heart failure. IMPRESSION: Progression of airspace disease in the right mid lung which could represent pneumonia. Persistent right lower lobe airspace disease and elevated right hemidiaphragm unchanged. Mild left lower lobe airspace disease Central venous catheter tip in the right atrium unchanged. Tracheostomy in good position. Electronically Signed   By: Franchot Gallo M.D.   On: 10/05/2020 13:11   DG  Abd Portable 1V  Result Date: 10/07/2020 CLINICAL DATA:  54 year old male with NG placement. EXAM: PORTABLE ABDOMEN - 1 VIEW COMPARISON:  Abdominal radiograph dated 10/02/2020 and CT dated 10/05/2020 FINDINGS: Partially visualized enteric tube with tip in the distal stomach. A pigtail cholecystostomy tube and a percutaneous gastrostomy noted. The stomach is distended with air. Air is noted within visualized loops of small bowel. There is right pleural effusion and right lung base atelectasis or infiltrate. IMPRESSION: 1. Enteric tube with tip in the distal stomach. 2. Gastric distention with air. Electronically Signed   By: Anner Crete M.D.   On: 10/07/2020 01:24   IR Paracentesis  Result Date: 10/06/2020 INDICATION: Patient with ascites, abdominal distension. Request is made for therapeutic paracentesis. EXAM: ULTRASOUND GUIDED THERAPEUTIC PARACENTESIS MEDICATIONS: 10 ML 1% LIDOCAINE COMPLICATIONS: None immediate. PROCEDURE: Informed written consent was obtained from the patient after a discussion of the risks, benefits and alternatives to treatment. A timeout was performed prior to the initiation of the procedure. Initial ultrasound scanning demonstrates a moderate amount of highly loculated ascites within the right lower abdominal quadrant. The right lower abdomen was prepped and draped in the usual sterile fashion. 1% lidocaine was used for local anesthesia. Following this, a 19 gauge, 7-cm, Yueh catheter was introduced into the largest pocket of fluid. An ultrasound image was saved for documentation purposes. The paracentesis was performed. The catheter was removed and a dressing was applied. The patient tolerated the procedure well without immediate post procedural complication. FINDINGS: A total of approximately 200 mL of dark, brown fluid was removed. Samples were sent to the laboratory as requested by the clinical team. IMPRESSION: Successful ultrasound-guided paracentesis yielding 200 mL  liters of peritoneal fluid. Read by: Brynda Greathouse PA-C Electronically Signed   By: Jerilynn Mages.  Shick M.D.   On: 10/06/2020 16:06    Assessment/Plan Active Problems:   Acute on chronic respiratory failure with hypoxia (HCC)   End stage renal failure on dialysis (HCC)   Severe sepsis with septic shock (CODE) (HCC)   Cardiac arrest (HCC)   Metabolic encephalopathy   1. Acute on chronic respiratory failure with hypoxia patient right now is on assist control on 35% FiO2 tidal volume 500.  Respiratory therapy will reassess the weaning.  Patient did undergo thoracentesis with removal of 200 cc of fluid 2. End-stage renal failure on hemodialysis we will continue with supportive care. 3. Severe sepsis with shock resolved hemodynamics are stable 4. Cardiac arrest rhythm has been stable 5. Metabolic encephalopathy no change we will continue to follow-up   I have personally seen and evaluated the patient, evaluated laboratory and imaging results, formulated the assessment and plan and placed orders. The Patient  requires high complexity decision making with multiple systems involvement.  Rounds were done with the Respiratory Therapy Director and Staff therapists and discussed with nursing staff also.  Allyne Gee, MD [facility] Pulmonary Critical Care Medicine Sleep Medicine

## 2020-10-08 DIAGNOSIS — I469 Cardiac arrest, cause unspecified: Secondary | ICD-10-CM | POA: Diagnosis not present

## 2020-10-08 DIAGNOSIS — N186 End stage renal disease: Secondary | ICD-10-CM | POA: Diagnosis not present

## 2020-10-08 DIAGNOSIS — J9621 Acute and chronic respiratory failure with hypoxia: Secondary | ICD-10-CM | POA: Diagnosis not present

## 2020-10-08 DIAGNOSIS — G9341 Metabolic encephalopathy: Secondary | ICD-10-CM | POA: Diagnosis not present

## 2020-10-08 LAB — CULTURE, RESPIRATORY W GRAM STAIN

## 2020-10-08 NOTE — Progress Notes (Addendum)
Pulmonary Critical Care Medicine [place]   PULMONARY CRITICAL CARE SERVICE  PROGRESS NOTE  Date of Service: 10/08/2020  Jeremy Yu  RAQ:[id number]  DOB: 1965-12-30   DOA: 09/22/2020  Referring Physician: Merton Border, MD  HPI: Jeremy Yu is a 54 y.o. male seen for follow up of Acute on Chronic Respiratory Failure.  Patient had a 16-hour goal today on aerosol trach collar however failed this wean and is back on pressure support 12/5 and FiO2 35%.  Medications: Reviewed on Rounds  Physical Exam:  Vitals: Pulse 103 respirations 36 BP 148/77 O2 sat 100% temp 96.7  Ventilator Settings pressure support 12/5 FiO2 35%  . General: Comfortable at this time . Eyes: Grossly normal lids, irises & conjunctiva . ENT: grossly tongue is normal . Neck: no obvious mass . Cardiovascular: S1 S2 normal no gallop . Respiratory: No rales or rhonchi noted . Abdomen: soft . Skin: no rash seen on limited exam . Musculoskeletal: not rigid . Psychiatric:unable to assess . Neurologic: no seizure no involuntary movements         Lab Data:   Basic Metabolic Panel: Recent Labs  Lab 10/02/20 1244 10/03/20 0643 10/04/20 0534 10/04/20 0800 10/05/20 1550 10/06/20 0426  NA 135 137 136  --  135 132*  K 3.0* 4.0 3.6  --  3.6 3.4*  CL 101 102 103  --  101 101  CO2 $Re'22 23 22  'TPj$ --  21* 18*  GLUCOSE 156* 145* 182*  --  307* 326*  BUN 35* 21* 35*  --  26* 32*  CREATININE 4.31* 2.99* 3.83*  --  3.12* 3.44*  CALCIUM 8.5* 8.8* 8.8*  --  8.9 8.8*  MG  --  1.9  --  2.0 2.0 1.9  PHOS 4.3 2.5 2.9  --  3.3 2.0*    ABG: Recent Labs  Lab 10/05/20 1314  PHART 7.288*  PCO2ART 44.3  PO2ART 55.4*  HCO3 20.9  O2SAT 87.6    Liver Function Tests: Recent Labs  Lab 10/02/20 1244 10/03/20 0643 10/04/20 0534 10/05/20 1550 10/06/20 0426  AST  --   --   --   --  53*  ALT  --   --   --   --  14  ALKPHOS  --   --   --   --  180*  BILITOT  --   --   --   --  4.0*  PROT  --    --   --   --  7.4  ALBUMIN 1.3* 1.7* 1.5* 2.1* 1.7*   No results for input(s): LIPASE, AMYLASE in the last 168 hours. No results for input(s): AMMONIA in the last 168 hours.  CBC: Recent Labs  Lab 10/04/20 1250 10/05/20 0516 10/05/20 1550 10/06/20 0845 10/07/20 0525  WBC 21.8* 30.3* 31.5* 21.6* 20.2*  NEUTROABS 17.4*  --   --   --   --   HGB 7.9* 7.8* 8.4* 7.0* 8.4*  HCT 25.5* 25.2* 27.2* 22.9* 26.8*  MCV 92.7 93.0 93.5 94.2 90.5  PLT 512* 523* 559* 516* 484*    Cardiac Enzymes: No results for input(s): CKTOTAL, CKMB, CKMBINDEX, TROPONINI in the last 168 hours.  BNP (last 3 results) No results for input(s): BNP in the last 8760 hours.  ProBNP (last 3 results) No results for input(s): PROBNP in the last 8760 hours.  Radiological Exams: DG CHEST PORT 1 VIEW  Result Date: 10/07/2020 CLINICAL DATA:  Hypoxia EXAM: PORTABLE CHEST 1 VIEW COMPARISON:  October 06, 2020 FINDINGS: Endotracheal tube tip is 5.6 cm above the carina. Central catheter tip is in the superior vena cava. Nasogastric tube tip and side port are below the diaphragm. There is a right pleural effusion with consolidation involving much of the right middle and lower lobes. There is slight left base atelectasis medially. Left lung otherwise clear. Heart is upper normal in size. The pulmonary vascularity is within normal limits. No appreciable adenopathy by radiography. IMPRESSION: Tube and catheter positions as described without pneumothorax. Right pleural effusion with consolidation involving much of the right middle and lower lobes. Slight medial left base atelectasis. Stable cardiac silhouette. Electronically Signed   By: Lowella Grip III M.D.   On: 10/07/2020 09:45   DG Abd Portable 1V  Result Date: 10/07/2020 CLINICAL DATA:  54 year old male with NG placement. EXAM: PORTABLE ABDOMEN - 1 VIEW COMPARISON:  Abdominal radiograph dated 10/02/2020 and CT dated 10/05/2020 FINDINGS: Partially visualized enteric tube  with tip in the distal stomach. A pigtail cholecystostomy tube and a percutaneous gastrostomy noted. The stomach is distended with air. Air is noted within visualized loops of small bowel. There is right pleural effusion and right lung base atelectasis or infiltrate. IMPRESSION: 1. Enteric tube with tip in the distal stomach. 2. Gastric distention with air. Electronically Signed   By: Anner Crete M.D.   On: 10/07/2020 01:24    Assessment/Plan Active Problems:   Acute on chronic respiratory failure with hypoxia (HCC)   End stage renal failure on dialysis (HCC)   Severe sepsis with septic shock (CODE) (HCC)   Cardiac arrest (HCC)   Metabolic encephalopathy   1. Acute on chronic respiratory failure with hypoxia patient will continue on pressure support at this time 12/5 FiO2 35% we will continue to attempt weaning on aerosol trach collar for 16-hour goal again tomorrow.  Continue supportive measures. 2. End-stage renal failure on hemodialysis we will continue with supportive care. 3. Severe sepsis with shock resolved hemodynamics are stable 4. Cardiac arrest rhythm has been stable 5. Metabolic encephalopathy no change we will continue to follow-up   I have personally seen and evaluated the patient, evaluated laboratory and imaging results, formulated the assessment and plan and placed orders. The Patient requires high complexity decision making with multiple systems involvement.  Rounds were done with the Respiratory Therapy Director and Staff therapists and discussed with nursing staff also.  Allyne Gee, MD [facility] Pulmonary Critical Care Medicine Sleep Medicine

## 2020-10-09 ENCOUNTER — Other Ambulatory Visit (HOSPITAL_COMMUNITY): Payer: Medicare Other

## 2020-10-09 DIAGNOSIS — J9621 Acute and chronic respiratory failure with hypoxia: Secondary | ICD-10-CM | POA: Diagnosis not present

## 2020-10-09 DIAGNOSIS — I469 Cardiac arrest, cause unspecified: Secondary | ICD-10-CM | POA: Diagnosis not present

## 2020-10-09 DIAGNOSIS — N186 End stage renal disease: Secondary | ICD-10-CM | POA: Diagnosis not present

## 2020-10-09 DIAGNOSIS — G9341 Metabolic encephalopathy: Secondary | ICD-10-CM | POA: Diagnosis not present

## 2020-10-09 LAB — RENAL FUNCTION PANEL
Albumin: 1.6 g/dL — ABNORMAL LOW (ref 3.5–5.0)
Anion gap: 10 (ref 5–15)
BUN: 52 mg/dL — ABNORMAL HIGH (ref 6–20)
CO2: 21 mmol/L — ABNORMAL LOW (ref 22–32)
Calcium: 9.2 mg/dL (ref 8.9–10.3)
Chloride: 103 mmol/L (ref 98–111)
Creatinine, Ser: 3.49 mg/dL — ABNORMAL HIGH (ref 0.61–1.24)
GFR, Estimated: 19 mL/min — ABNORMAL LOW (ref >60)
Glucose, Bld: 234 mg/dL — ABNORMAL HIGH (ref 70–99)
Phosphorus: 4.2 mg/dL (ref 2.5–4.6)
Potassium: 3.1 mmol/L — ABNORMAL LOW (ref 3.5–5.1)
Sodium: 134 mmol/L — ABNORMAL LOW (ref 135–145)

## 2020-10-09 LAB — CBC
HCT: 25.5 % — ABNORMAL LOW (ref 39.0–52.0)
HCT: 26.2 % — ABNORMAL LOW (ref 39.0–52.0)
Hemoglobin: 7.8 g/dL — ABNORMAL LOW (ref 13.0–17.0)
Hemoglobin: 8.4 g/dL — ABNORMAL LOW (ref 13.0–17.0)
MCH: 28.3 pg (ref 26.0–34.0)
MCH: 29.8 pg (ref 26.0–34.0)
MCHC: 30.6 g/dL (ref 30.0–36.0)
MCHC: 32.1 g/dL (ref 30.0–36.0)
MCV: 92.4 fL (ref 80.0–100.0)
MCV: 92.9 fL (ref 80.0–100.0)
Platelets: 504 10*3/uL — ABNORMAL HIGH (ref 150–400)
Platelets: 513 10*3/uL — ABNORMAL HIGH (ref 150–400)
RBC: 2.76 MIL/uL — ABNORMAL LOW (ref 4.22–5.81)
RBC: 2.82 MIL/uL — ABNORMAL LOW (ref 4.22–5.81)
RDW: 21.7 % — ABNORMAL HIGH (ref 11.5–15.5)
RDW: 23.4 % — ABNORMAL HIGH (ref 11.5–15.5)
WBC: 22.1 10*3/uL — ABNORMAL HIGH (ref 4.0–10.5)
WBC: 19.6 10*3/uL — ABNORMAL HIGH (ref 4.0–10.5)
nRBC: 0.5 % — ABNORMAL HIGH (ref 0.0–0.2)
nRBC: 0.4 % — ABNORMAL HIGH (ref 0.0–0.2)

## 2020-10-09 LAB — MAGNESIUM: Magnesium: 2 mg/dL (ref 1.7–2.4)

## 2020-10-09 LAB — POTASSIUM: Potassium: 3.2 mmol/L — ABNORMAL LOW (ref 3.5–5.1)

## 2020-10-09 LAB — VANCOMYCIN, TROUGH: Vancomycin Tr: 9 ug/mL — ABNORMAL LOW (ref 15–20)

## 2020-10-09 NOTE — Progress Notes (Signed)
Interventional Radiology Brief Note:  PA familiar with patient from recent paracentesis 10/06/20.  Patient has highly loculated ascites and has undergone paracentesis x2. 900 mL removed 10/8, then only 200 mL removed from the largest pocket visible 10/06/20.  Order now placed for another paracentesis.  Discussed with Priya, [employer].  Due to presence of several loculations, patient will not be able to remove all of his ascites.  Within the week between paracentesis (10/8-10/15) he only re-accumulated the accessible 200 mL.  Recommend re-evaluate with US Abdomen toward the end of the week to see if there is fluid amenable to drainage. Paracentesis can then be performed if appropriate.  Brynda Greathouse, MS RD PA-C 10:52 AM

## 2020-10-09 NOTE — Progress Notes (Signed)
Pulmonary Critical Care Medicine [place]   PULMONARY CRITICAL CARE SERVICE  PROGRESS NOTE  Date of Service: 10/09/2020  Jeremy Yu  TDD:[id number]  DOB: [birth year]   DOA: 09/22/2020  Referring Physician: Merton Border, MD  HPI: Jeremy Yu is a 54 y.o. male seen for follow up of Acute on Chronic Respiratory Failure.  Patient is on T collar to 16 hours  Medications: Reviewed on Rounds  Physical Exam:  Vitals: Temperature is 98.0 pulse 103 respiratory 29 blood pressure is 173/92 saturations 100%  Ventilator Settings on T collar with a 16-hour goal  . General: Comfortable at this time . Eyes: Grossly normal lids, irises & conjunctiva . ENT: grossly tongue is normal . Neck: no obvious mass . Cardiovascular: S1 S2 normal no gallop . Respiratory: Coarse breath sounds with few scattered rhonchi . Abdomen: soft . Skin: no rash seen on limited exam . Musculoskeletal: not rigid . Psychiatric:unable to assess . Neurologic: no seizure no involuntary movements         Lab Data:   Basic Metabolic Panel: Recent Labs  Lab 10/03/20 0643 10/04/20 0534 10/04/20 0800 10/05/20 1550 10/06/20 0426 10/09/20 0717  NA 137 136  --  135 132* 134*  K 4.0 3.6  --  3.6 3.4* 3.1*  CL 102 103  --  101 101 103  CO2 23 22  --  21* 18* 21*  GLUCOSE 145* 182*  --  307* 326* 234*  BUN 21* 35*  --  26* 32* 52*  CREATININE 2.99* 3.83*  --  3.12* 3.44* 3.49*  CALCIUM 8.8* 8.8*  --  8.9 8.8* 9.2  MG 1.9  --  2.0 2.0 1.9 2.0  PHOS 2.5 2.9  --  3.3 2.0* 4.2    ABG: Recent Labs  Lab 10/05/20 1314  PHART 7.288*  PCO2ART 44.3  PO2ART 55.4*  HCO3 20.9  O2SAT 87.6    Liver Function Tests: Recent Labs  Lab 10/03/20 0643 10/04/20 0534 10/05/20 1550 10/06/20 0426 10/09/20 0717  AST  --   --   --  53*  --   ALT  --   --   --  14  --   ALKPHOS  --   --   --  180*  --   BILITOT  --   --   --  4.0*  --   PROT  --   --   --  7.4  --   ALBUMIN 1.7* 1.5* 2.1*  1.7* 1.6*   No results for input(s): LIPASE, AMYLASE in the last 168 hours. No results for input(s): AMMONIA in the last 168 hours.  CBC: Recent Labs  Lab 10/04/20 1250 10/04/20 1250 10/05/20 0516 10/05/20 1550 10/06/20 0845 10/07/20 0525 10/09/20 0717  WBC 21.8*   < > 30.3* 31.5* 21.6* 20.2* 22.1*  NEUTROABS 17.4*  --   --   --   --   --   --   HGB 7.9*   < > 7.8* 8.4* 7.0* 8.4* 7.8*  HCT 25.5*   < > 25.2* 27.2* 22.9* 26.8* 25.5*  MCV 92.7   < > 93.0 93.5 94.2 90.5 92.4  PLT 512*   < > 523* 559* 516* 484* 504*   < > = values in this interval not displayed.    Cardiac Enzymes: No results for input(s): CKTOTAL, CKMB, CKMBINDEX, TROPONINI in the last 168 hours.  BNP (last 3 results) No results for input(s): BNP in the last 8760 hours.  ProBNP (last 3  results) No results for input(s): PROBNP in the last 8760 hours.  Radiological Exams: No results found.  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 Acute on chronic respiratory failure hypoxia we'll continue to wean on T collar. 2. End-stage renal failure on hemodialysis followed by nephrology 3. Severe sepsis with shock resolved hemodynamics are stable 4. Cardiac arrest rhythm has been stable 5. Metabolic encephalopathy no change we'll continue present management.   I have personally seen and evaluated the patient, evaluated laboratory and imaging results, formulated the assessment and plan and placed orders. The Patient requires high complexity decision making with multiple systems involvement.  Rounds were done with the Respiratory Therapy Director and Staff therapists and discussed with nursing staff also.  Allyne Gee, MD [facility] Pulmonary Critical Care Medicine Sleep Medicine

## 2020-10-09 NOTE — Progress Notes (Signed)
Vancomycin,renal, and cbc collected via this HD nurse sent to lab awaiting results

## 2020-10-09 NOTE — Progress Notes (Signed)
[facility]  ROUNDING NOTE   Subjective:  Patient seen this a.m. during hemodialysis treatment. Blood flow rate 400 with ultrafiltration target of 2 kg net. Still on the ventilator. Currently on TPN.  Objective:  Vital signs in last 24 hours:  Temperature 98 pulse 103 respirations 29 blood pressure 173/92   Physical Exam: General:  Chronically ill-appearing  Head:  Normocephalic, atraumatic. Moist oral mucosal membranes  Eyes:  Anicteric  Neck:  Tracheostomy in place  Lungs:   Coarse breath sounds bilateral, normal effort  Heart:  S1S2 no rubs  Abdomen:   Soft, nontender, bowel sounds present, PEG in place, colostomy  Extremities:  Right AKA  Neurologic:  Awake, alert, following commands  Skin:  No lesions  Access:  Left upper extremity AV fistula    Basic Metabolic Panel: Recent Labs  Lab 10/02/20 1244 10/02/20 1244 10/03/20 0643 10/03/20 0643 10/04/20 0534 10/04/20 0800 10/05/20 1550 10/06/20 0426  NA 135  --  137  --  136  --  135 132*  K 3.0*  --  4.0  --  3.6  --  3.6 3.4*  CL 101  --  102  --  103  --  101 101  CO2 22  --  23  --  22  --  21* 18*  GLUCOSE 156*  --  145*  --  182*  --  307* 326*  BUN 35*  --  21*  --  35*  --  26* 32*  CREATININE 4.31*  --  2.99*  --  3.83*  --  3.12* 3.44*  CALCIUM 8.5*   < > 8.8*   < > 8.8*  --  8.9 8.8*  MG  --   --  1.9  --   --  2.0 2.0 1.9  PHOS 4.3  --  2.5  --  2.9  --  3.3 2.0*   < > = values in this interval not displayed.    Liver Function Tests: Recent Labs  Lab 10/02/20 1244 10/03/20 0643 10/04/20 0534 10/05/20 1550 10/06/20 0426  AST  --   --   --   --  53*  ALT  --   --   --   --  14  ALKPHOS  --   --   --   --  180*  BILITOT  --   --   --   --  4.0*  PROT  --   --   --   --  7.4  ALBUMIN 1.3* 1.7* 1.5* 2.1* 1.7*   No results for input(s): LIPASE, AMYLASE in the last 168 hours. No results for input(s): AMMONIA in the last 168 hours.  CBC: Recent Labs  Lab 10/04/20 1250  10/05/20 0516 10/05/20 1550 10/06/20 0845 10/07/20 0525  WBC 21.8* 30.3* 31.5* 21.6* 20.2*  NEUTROABS 17.4*  --   --   --   --   HGB 7.9* 7.8* 8.4* 7.0* 8.4*  HCT 25.5* 25.2* 27.2* 22.9* 26.8*  MCV 92.7 93.0 93.5 94.2 90.5  PLT 512* 523* 559* 516* 484*    Cardiac Enzymes: No results for input(s): CKTOTAL, CKMB, CKMBINDEX, TROPONINI in the last 168 hours.  BNP: Invalid input(s): POCBNP  CBG: No results for input(s): GLUCAP in the last 168 hours.  Microbiology: Results for orders placed or performed during the hospital encounter of 09/22/20  Culture, respiratory (non-expectorated)     Status: None   Collection Time: 09/23/20 11:25 AM   Specimen: Tracheal Aspirate; Respiratory  Result Value Ref Range Status   Specimen Description TRACHEAL ASPIRATE  Final   Special Requests NONE  Final   Gram Stain   Final    RARE WBC PRESENT,BOTH PMN AND MONONUCLEAR NO ORGANISMS SEEN Performed at [facility], [address], [place], [place] [address]    Culture FEW KLEBSIELLA PNEUMONIAE  Final   Report Status 09/25/2020 FINAL  Final   Organism ID, Bacteria KLEBSIELLA PNEUMONIAE  Final      Susceptibility   Klebsiella pneumoniae - MIC*    AMPICILLIN >=32 RESISTANT Resistant     CEFAZOLIN <=4 SENSITIVE Sensitive     CEFEPIME 0.25 SENSITIVE Sensitive     CEFTAZIDIME <=1 SENSITIVE Sensitive     CEFTRIAXONE 1 SENSITIVE Sensitive     CIPROFLOXACIN <=0.25 SENSITIVE Sensitive     GENTAMICIN <=1 SENSITIVE Sensitive     IMIPENEM <=0.25 SENSITIVE Sensitive     TRIMETH/SULFA <=20 SENSITIVE Sensitive     AMPICILLIN/SULBACTAM 16 INTERMEDIATE Intermediate     PIP/TAZO 64 INTERMEDIATE Intermediate     * FEW KLEBSIELLA PNEUMONIAE  Culture, blood (routine x 2)     Status: None   Collection Time: 09/23/20  4:26 PM   Specimen: BLOOD  Result Value Ref Range Status   Specimen Description BLOOD BLOOD RIGHT HAND  Final   Special Requests   Final    AEROBIC BOTTLE ONLY Blood Culture results may  not be optimal due to an inadequate volume of blood received in culture bottles   Culture   Final    NO GROWTH 5 DAYS Performed at [facility], [address] [place], [place] [address]    Report Status 09/28/2020 FINAL  Final  Culture, blood (single)     Status: None   Collection Time: 09/24/20  6:00 AM   Specimen: BLOOD RIGHT HAND  Result Value Ref Range Status   Specimen Description BLOOD RIGHT HAND  Final   Special Requests   Final    BOTTLES DRAWN AEROBIC ONLY Blood Culture results may not be optimal due to an inadequate volume of blood received in culture bottles   Culture   Final    NO GROWTH 5 DAYS Performed at [facility], [place] [address], [place], [place] [address]    Report Status 09/29/2020 FINAL  Final  Body fluid culture     Status: None   Collection Time: 09/29/20 12:27 PM   Specimen: PATH Cytology Peritoneal fluid  Result Value Ref Range Status   Specimen Description PERITONEAL FLUID  Final   Special Requests NONE  Final   Gram Stain   Final    WBC PRESENT,BOTH PMN AND MONONUCLEAR NO ORGANISMS SEEN CYTOSPIN SMEAR    Culture   Final    NO GROWTH 3 DAYS Performed at [facility], [address], [place], [place] [address]    Report Status 10/03/2020 FINAL  Final  Culture, blood (Routine X 2) w Reflex to ID Panel     Status: Abnormal   Collection Time: 10/04/20 12:50 PM   Specimen: BLOOD  Result Value Ref Range Status   Specimen Description BLOOD RIGHT ANTECUBITAL  Final   Special Requests   Final    BOTTLES DRAWN AEROBIC AND ANAEROBIC Blood Culture adequate volume   Culture  Setup Time   Final    GRAM POSITIVE COCCI IN CLUSTERS AEROBIC BOTTLE ONLY CRITICAL RESULT CALLED TO, READ BACK BY AND VERIFIED WITH: A. Grandville Silos RN 16:20 10/05/20 ([email or web address]) Performed at [facility], [place] [address], [place], [place] [address]    Culture STAPHYLOCOCCUS EPIDERMIDIS (A)  Final   Report Status 10/07/2020 FINAL  Final   Organism ID, Bacteria  STAPHYLOCOCCUS EPIDERMIDIS  Final      Susceptibility   Staphylococcus epidermidis - MIC*    CIPROFLOXACIN >=8 RESISTANT Resistant     ERYTHROMYCIN >=8 RESISTANT Resistant     GENTAMICIN >=16 RESISTANT Resistant     OXACILLIN >=4 RESISTANT Resistant     TETRACYCLINE 2 SENSITIVE Sensitive     VANCOMYCIN 2 SENSITIVE Sensitive     TRIMETH/SULFA 160 RESISTANT Resistant     CLINDAMYCIN >=8 RESISTANT Resistant     RIFAMPIN <=0.5 SENSITIVE Sensitive     Inducible Clindamycin NEGATIVE Sensitive     * STAPHYLOCOCCUS EPIDERMIDIS  Blood Culture ID Panel (Reflexed)     Status: Abnormal   Collection Time: 10/04/20 12:50 PM  Result Value Ref Range Status   Enterococcus faecalis NOT DETECTED NOT DETECTED Final   Enterococcus Faecium NOT DETECTED NOT DETECTED Final   Listeria monocytogenes NOT DETECTED NOT DETECTED Final   Staphylococcus species DETECTED (A) NOT DETECTED Final    Comment: CRITICAL RESULT CALLED TO, READ BACK BY AND VERIFIED WITH: A. Grandville Silos RN 16:20 10/05/20 ([email or web address])    Staphylococcus aureus (BCID) NOT DETECTED NOT DETECTED Final   Staphylococcus epidermidis DETECTED (A) NOT DETECTED Final    Comment: Methicillin (oxacillin) resistant coagulase negative staphylococcus. Possible blood culture contaminant (unless isolated from more than one blood culture draw or clinical case suggests pathogenicity). No antibiotic treatment is indicated for blood  culture contaminants. CRITICAL RESULT CALLED TO, READ BACK BY AND VERIFIED WITH: A. Grandville Silos RN 16:20 10/05/20 ([email or web address])    Staphylococcus lugdunensis NOT DETECTED NOT DETECTED Final   Streptococcus species NOT DETECTED NOT DETECTED Final   Streptococcus agalactiae NOT DETECTED NOT DETECTED Final   Streptococcus pneumoniae NOT DETECTED NOT DETECTED Final   Streptococcus pyogenes NOT DETECTED NOT DETECTED Final   A.calcoaceticus-baumannii NOT DETECTED NOT DETECTED Final   Bacteroides fragilis NOT DETECTED NOT DETECTED Final    Enterobacterales NOT DETECTED NOT DETECTED Final   Enterobacter cloacae complex NOT DETECTED NOT DETECTED Final   Escherichia coli NOT DETECTED NOT DETECTED Final   Klebsiella aerogenes NOT DETECTED NOT DETECTED Final   Klebsiella oxytoca NOT DETECTED NOT DETECTED Final   Klebsiella pneumoniae NOT DETECTED NOT DETECTED Final   Proteus species NOT DETECTED NOT DETECTED Final   Salmonella species NOT DETECTED NOT DETECTED Final   Serratia marcescens NOT DETECTED NOT DETECTED Final   Haemophilus influenzae NOT DETECTED NOT DETECTED Final   Neisseria meningitidis NOT DETECTED NOT DETECTED Final   Pseudomonas aeruginosa NOT DETECTED NOT DETECTED Final   Stenotrophomonas maltophilia NOT DETECTED NOT DETECTED Final   Candida albicans NOT DETECTED NOT DETECTED Final   Candida auris NOT DETECTED NOT DETECTED Final   Candida glabrata NOT DETECTED NOT DETECTED Final   Candida krusei NOT DETECTED NOT DETECTED Final   Candida parapsilosis NOT DETECTED NOT DETECTED Final   Candida tropicalis NOT DETECTED NOT DETECTED Final   Cryptococcus neoformans/gattii NOT DETECTED NOT DETECTED Final   Methicillin resistance mecA/C DETECTED (A) NOT DETECTED Final    Comment: CRITICAL RESULT CALLED TO, READ BACK BY AND VERIFIED WITH: A. Grandville Silos RN 16:20 10/05/20 ([email or web address]) Performed at [facility], [place] [address], [place], [place] [address]   Culture, blood (Routine X 2) w Reflex to ID Panel     Status: Abnormal   Collection Time: 10/04/20  12:51 PM   Specimen: BLOOD RIGHT HAND  Result Value Ref Range Status   Specimen Description BLOOD RIGHT HAND  Final   Special Requests   Final    BOTTLES DRAWN AEROBIC AND ANAEROBIC Blood Culture adequate volume   Culture  Setup Time   Final    GRAM POSITIVE COCCI AEROBIC BOTTLE ONLY CRITICAL VALUE NOTED.  VALUE IS CONSISTENT WITH PREVIOUSLY REPORTED AND CALLED VALUE.    Culture (A)  Final    STAPHYLOCOCCUS EPIDERMIDIS SUSCEPTIBILITIES PERFORMED ON PREVIOUS  CULTURE WITHIN THE LAST 5 DAYS. Performed at [facility], [place] [address], [place], [place] [address]    Report Status 10/07/2020 FINAL  Final  C Difficile Quick Screen (NO PCR Reflex)     Status: None   Collection Time: 10/04/20  5:19 PM   Specimen: STOOL  Result Value Ref Range Status   C Diff antigen NEGATIVE NEGATIVE Final   C Diff toxin NEGATIVE NEGATIVE Final   C Diff interpretation No C. difficile detected.  Final    Comment: Performed at [facility], [place] [address] [place], [place] [address]  Culture, respiratory (non-expectorated)     Status: None   Collection Time: 10/05/20 10:14 PM   Specimen: Tracheal Aspirate; Respiratory  Result Value Ref Range Status   Specimen Description TRACHEAL ASPIRATE  Final   Special Requests NONE  Final   Gram Stain   Final    MODERATE WBC PRESENT, PREDOMINANTLY PMN MODERATE GRAM NEGATIVE RODS FEW YEAST RARE GRAM POSITIVE COCCI IN CHAINS Performed at [facility], [address] [place], [place] [address]    Culture MODERATE KLEBSIELLA PNEUMONIAE  Final   Report Status 10/08/2020 FINAL  Final   Organism ID, Bacteria KLEBSIELLA PNEUMONIAE  Final      Susceptibility   Klebsiella pneumoniae - MIC*    AMPICILLIN RESISTANT Resistant     CEFAZOLIN <=4 SENSITIVE Sensitive     CEFEPIME <=0.12 SENSITIVE Sensitive     CEFTAZIDIME <=1 SENSITIVE Sensitive     CEFTRIAXONE <=0.25 SENSITIVE Sensitive     CIPROFLOXACIN >=4 RESISTANT Resistant     GENTAMICIN <=1 SENSITIVE Sensitive     IMIPENEM <=0.25 SENSITIVE Sensitive     TRIMETH/SULFA <=20 SENSITIVE Sensitive     AMPICILLIN/SULBACTAM 8 SENSITIVE Sensitive     PIP/TAZO 16 SENSITIVE Sensitive     * MODERATE KLEBSIELLA PNEUMONIAE    Coagulation Studies: No results for input(s): LABPROT, INR in the last 72 hours.  Urinalysis: No results for input(s): COLORURINE, LABSPEC, PHURINE, GLUCOSEU, HGBUR, BILIRUBINUR, KETONESUR, PROTEINUR, UROBILINOGEN, NITRITE, LEUKOCYTESUR in the  last 72 hours.  Invalid input(s): APPERANCEUR    Imaging: DG CHEST PORT 1 VIEW  Result Date: 10/07/2020 CLINICAL DATA:  Hypoxia EXAM: PORTABLE CHEST 1 VIEW COMPARISON:  October 06, 2020 FINDINGS: Endotracheal tube tip is 5.6 cm above the carina. Central catheter tip is in the superior vena cava. Nasogastric tube tip and side port are below the diaphragm. There is a right pleural effusion with consolidation involving much of the right middle and lower lobes. There is slight left base atelectasis medially. Left lung otherwise clear. Heart is upper normal in size. The pulmonary vascularity is within normal limits. No appreciable adenopathy by radiography. IMPRESSION: Tube and catheter positions as described without pneumothorax. Right pleural effusion with consolidation involving much of the right middle and lower lobes. Slight medial left base atelectasis. Stable cardiac silhouette. Electronically Signed   By: Lowella Grip III M.D.   On: 10/07/2020  09:45     Medications:     iohexol, lidocaine  Assessment/ Plan:  54 y.o. 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   1.  ESRD on HD.  Patient seen during hemodialysis treatment this AM.  Tolerating well.  Ultrafiltration target 2 kg.  2.  Hypotension.  Patient to receive albumin during the course of dialysis treatment today.  3.  Anemia of chronic kidney disease.   Lab Results  Component Value Date   HGB 8.4 (L) 10/07/2020  Continue Retacrit 10,000 IV with dialysis.  Follow-up CBC today.  4.  Secondary  hyperparathyroidism.  Awaiting new serum phosphorus today.  5.  Acute respiratory failure.  Continue vent support.  Weaning as per pulmonary/critical care.    LOS: 0 Diantha Paxson 10/18/20217:45 AM

## 2020-10-10 DIAGNOSIS — G9341 Metabolic encephalopathy: Secondary | ICD-10-CM | POA: Diagnosis not present

## 2020-10-10 DIAGNOSIS — I469 Cardiac arrest, cause unspecified: Secondary | ICD-10-CM | POA: Diagnosis not present

## 2020-10-10 DIAGNOSIS — J9621 Acute and chronic respiratory failure with hypoxia: Secondary | ICD-10-CM | POA: Diagnosis not present

## 2020-10-10 DIAGNOSIS — N186 End stage renal disease: Secondary | ICD-10-CM | POA: Diagnosis not present

## 2020-10-10 NOTE — Progress Notes (Addendum)
Pulmonary Critical Care Medicine [place]   PULMONARY CRITICAL CARE SERVICE  PROGRESS NOTE  Date of Service: 10/10/2020  Jeremy Yu  YJE:[id number]  DOB: June 28, 1966   DOA: 09/22/2020  Referring Physician: Merton Border, MD  HPI: Jeremy Yu is a 54 y.o. male seen for follow up of Acute on Chronic Respiratory Failure.  Patient remains on 28% aerosol trach collar for 20-hour goal at this time satting well no distress.  Medications: Reviewed on Rounds  Physical Exam:  Vitals: Pulse 100 respirations 20 BP 104/49 O2 sat 97% temp 98.3  Ventilator Settings not currently on ventilator  . General: Comfortable at this time . Eyes: Grossly normal lids, irises & conjunctiva . ENT: grossly tongue is normal . Neck: no obvious mass . Cardiovascular: S1 S2 normal no gallop . Respiratory: No rales or rhonchi noted . Abdomen: soft . Skin: no rash seen on limited exam . Musculoskeletal: not rigid . Psychiatric:unable to assess . Neurologic: no seizure no involuntary movements         Lab Data:   Basic Metabolic Panel: Recent Labs  Lab 10/04/20 0534 10/04/20 0800 10/05/20 1550 10/06/20 0426 10/09/20 0717 10/09/20 1436  NA 136  --  135 132* 134*  --   K 3.6  --  3.6 3.4* 3.1* 3.2*  CL 103  --  101 101 103  --   CO2 22  --  21* 18* 21*  --   GLUCOSE 182*  --  307* 326* 234*  --   BUN 35*  --  26* 32* 52*  --   CREATININE 3.83*  --  3.12* 3.44* 3.49*  --   CALCIUM 8.8*  --  8.9 8.8* 9.2  --   MG  --  2.0 2.0 1.9 2.0  --   PHOS 2.9  --  3.3 2.0* 4.2  --     ABG: Recent Labs  Lab 10/05/20 1314  PHART 7.288*  PCO2ART 44.3  PO2ART 55.4*  HCO3 20.9  O2SAT 87.6    Liver Function Tests: Recent Labs  Lab 10/04/20 0534 10/05/20 1550 10/06/20 0426 10/09/20 0717  AST  --   --  53*  --   ALT  --   --  14  --   ALKPHOS  --   --  180*  --   BILITOT  --   --  4.0*  --   PROT  --   --  7.4  --   ALBUMIN 1.5* 2.1* 1.7* 1.6*   No results for  input(s): LIPASE, AMYLASE in the last 168 hours. No results for input(s): AMMONIA in the last 168 hours.  CBC: Recent Labs  Lab 10/04/20 1250 10/05/20 0516 10/05/20 1550 10/06/20 0845 10/07/20 0525 10/09/20 0717 10/09/20 2130  WBC 21.8*   < > 31.5* 21.6* 20.2* 22.1* 19.6*  NEUTROABS 17.4*  --   --   --   --   --   --   HGB 7.9*   < > 8.4* 7.0* 8.4* 7.8* 8.4*  HCT 25.5*   < > 27.2* 22.9* 26.8* 25.5* 26.2*  MCV 92.7   < > 93.5 94.2 90.5 92.4 92.9  PLT 512*   < > 559* 516* 484* 504* 513*   < > = values in this interval not displayed.    Cardiac Enzymes: No results for input(s): CKTOTAL, CKMB, CKMBINDEX, TROPONINI in the last 168 hours.  BNP (last 3 results) No results for input(s): BNP in the last 8760 hours.  ProBNP (  last 3 results) No results for input(s): PROBNP in the last 8760 hours.  Radiological Exams: DG CHEST PORT 1 VIEW  Result Date: 10/09/2020 CLINICAL DATA:  Pneumonia. EXAM: PORTABLE CHEST 1 VIEW COMPARISON:  Chest x-ray dated October 07, 2020. FINDINGS: Unchanged tracheostomy tube and enteric tube. Unchanged tunneled left internal jugular central venous catheter. Normal heart size. Unchanged collapse of the right lower lobe. Mildly improved aeration of the right middle lobe. Patchy opacities in the right upper lobe and medial left lower lobe have slightly improved. Unchanged small right pleural effusion. No pneumothorax. No acute osseous abnormality. IMPRESSION: 1. Mildly improved aeration of the right middle lobe. Unchanged right lower lobe collapse and small right pleural effusion. 2. Slightly improved right upper lobe and medial left lower lobe pneumonia. Electronically Signed   By: Titus Dubin M.D.   On: 10/09/2020 12:18   DG Abd Portable 1V  Result Date: 10/09/2020 CLINICAL DATA:  Gastric distension. EXAM: PORTABLE ABDOMEN - 1 VIEW COMPARISON:  Abdominal x-ray dated October 07, 2020. FINDINGS: Unchanged enteric and gastrostomy tubes. Unchanged  cholecystostomy tube. Significantly decreased gastric distension. Nonobstructive bowel gas pattern. No acute osseous abnormality. IMPRESSION: 1. Significantly decreased gastric distension. Electronically Signed   By: Titus Dubin M.D.   On: 10/09/2020 12:19    Assessment/Plan Active Problems:   Acute on chronic respiratory failure with hypoxia (HCC)   End stage renal failure on dialysis (HCC)   Severe sepsis with septic shock (CODE) (HCC)   Cardiac arrest (HCC)   Metabolic encephalopathy   1. Acute on chronic respiratory failure hypoxia patient will continue for 20-hour goal on aerosol trach collar at this time will continue aggressive pulmonary toilet supportive measures. 2. End-stage renal failure on hemodialysis followed by nephrology 3. Severe sepsis with shock resolved hemodynamics are stable 4. Cardiac arrest rhythm has been stable 5. Metabolic encephalopathy no change we'll continue present management.   I have personally seen and evaluated the patient, evaluated laboratory and imaging results, formulated the assessment and plan and placed orders. The Patient requires high complexity decision making with multiple systems involvement.  Rounds were done with the Respiratory Therapy Director and Staff therapists and discussed with nursing staff also.  Allyne Gee, MD [facility] Pulmonary Critical Care Medicine Sleep Medicine

## 2020-10-11 DIAGNOSIS — I469 Cardiac arrest, cause unspecified: Secondary | ICD-10-CM | POA: Diagnosis not present

## 2020-10-11 DIAGNOSIS — G9341 Metabolic encephalopathy: Secondary | ICD-10-CM | POA: Diagnosis not present

## 2020-10-11 DIAGNOSIS — J9621 Acute and chronic respiratory failure with hypoxia: Secondary | ICD-10-CM | POA: Diagnosis not present

## 2020-10-11 DIAGNOSIS — N186 End stage renal disease: Secondary | ICD-10-CM | POA: Diagnosis not present

## 2020-10-11 LAB — CBC
HCT: 25.4 % — ABNORMAL LOW (ref 39.0–52.0)
Hemoglobin: 8 g/dL — ABNORMAL LOW (ref 13.0–17.0)
MCH: 28.9 pg (ref 26.0–34.0)
MCHC: 31.5 g/dL (ref 30.0–36.0)
MCV: 91.7 fL (ref 80.0–100.0)
Platelets: 500 10*3/uL — ABNORMAL HIGH (ref 150–400)
RBC: 2.77 MIL/uL — ABNORMAL LOW (ref 4.22–5.81)
RDW: 22.5 % — ABNORMAL HIGH (ref 11.5–15.5)
WBC: 16.7 10*3/uL — ABNORMAL HIGH (ref 4.0–10.5)
nRBC: 0.2 % (ref 0.0–0.2)

## 2020-10-11 LAB — RENAL FUNCTION PANEL
Albumin: 1.6 g/dL — ABNORMAL LOW (ref 3.5–5.0)
Anion gap: 10 (ref 5–15)
BUN: 52 mg/dL — ABNORMAL HIGH (ref 6–20)
CO2: 23 mmol/L (ref 22–32)
Calcium: 9.1 mg/dL (ref 8.9–10.3)
Chloride: 97 mmol/L — ABNORMAL LOW (ref 98–111)
Creatinine, Ser: 2.99 mg/dL — ABNORMAL HIGH (ref 0.61–1.24)
GFR, Estimated: 23 mL/min — ABNORMAL LOW (ref >60)
Glucose, Bld: 219 mg/dL — ABNORMAL HIGH (ref 70–99)
Phosphorus: 4.4 mg/dL (ref 2.5–4.6)
Potassium: 3.5 mmol/L (ref 3.5–5.1)
Sodium: 130 mmol/L — ABNORMAL LOW (ref 135–145)

## 2020-10-11 LAB — MAGNESIUM: Magnesium: 1.7 mg/dL (ref 1.7–2.4)

## 2020-10-11 LAB — VANCOMYCIN, TROUGH: Vancomycin Tr: 22 ug/mL (ref 15–20)

## 2020-10-11 NOTE — Progress Notes (Signed)
[facility]  ROUNDING NOTE   Subjective:  Patient seen and evaluated during hemodialysis treatment. Ultrafiltration target 2 kg today.  Objective:  Vital signs in last 24 hours:  Temperature 99 pulse 102 respirations 24 blood pressure 164/97   Physical Exam: General:  Chronically ill-appearing  Head:  Normocephalic, atraumatic. Moist oral mucosal membranes  Eyes:  Anicteric  Neck:  Tracheostomy in place  Lungs:   Coarse breath sounds bilateral, normal effort  Heart:  S1S2 no rubs  Abdomen:   Soft, nontender, bowel sounds present, PEG in place, colostomy  Extremities:  Right AKA  Neurologic:  Awake, alert, following commands  Skin:  No lesions  Access:  Left upper extremity AV fistula    Basic Metabolic Panel: Recent Labs  Lab 10/05/20 1550 10/05/20 1550 10/06/20 0426 10/09/20 0717 10/09/20 1436 10/11/20 0521  NA 135  --  132* 134*  --  130*  K 3.6  --  3.4* 3.1* 3.2* 3.5  CL 101  --  101 103  --  97*  CO2 21*  --  18* 21*  --  23  GLUCOSE 307*  --  326* 234*  --  219*  BUN 26*  --  32* 52*  --  52*  CREATININE 3.12*  --  3.44* 3.49*  --  2.99*  CALCIUM 8.9   < > 8.8* 9.2  --  9.1  MG 2.0  --  1.9 2.0  --  1.7  PHOS 3.3  --  2.0* 4.2  --  4.4   < > = values in this interval not displayed.    Liver Function Tests: Recent Labs  Lab 10/05/20 1550 10/06/20 0426 10/09/20 0717 10/11/20 0521  AST  --  53*  --   --   ALT  --  14  --   --   ALKPHOS  --  180*  --   --   BILITOT  --  4.0*  --   --   PROT  --  7.4  --   --   ALBUMIN 2.1* 1.7* 1.6* 1.6*   No results for input(s): LIPASE, AMYLASE in the last 168 hours. No results for input(s): AMMONIA in the last 168 hours.  CBC: Recent Labs  Lab 10/04/20 1250 10/05/20 0516 10/06/20 0845 10/07/20 0525 10/09/20 0717 10/09/20 2130 10/11/20 0521  WBC 21.8*   < > 21.6* 20.2* 22.1* 19.6* 16.7*  NEUTROABS 17.4*  --   --   --   --   --   --   HGB 7.9*   < > 7.0* 8.4* 7.8* 8.4* 8.0*  HCT 25.5*   <  > 22.9* 26.8* 25.5* 26.2* 25.4*  MCV 92.7   < > 94.2 90.5 92.4 92.9 91.7  PLT 512*   < > 516* 484* 504* 513* 500*   < > = values in this interval not displayed.    Cardiac Enzymes: No results for input(s): CKTOTAL, CKMB, CKMBINDEX, TROPONINI in the last 168 hours.  BNP: Invalid input(s): POCBNP  CBG: No results for input(s): GLUCAP in the last 168 hours.  Microbiology: Results for orders placed or performed during the hospital encounter of 09/22/20  Culture, respiratory (non-expectorated)     Status: None   Collection Time: 09/23/20 11:25 AM   Specimen: Tracheal Aspirate; Respiratory  Result Value Ref Range Status   Specimen Description TRACHEAL ASPIRATE  Final   Special Requests NONE  Final   Gram Stain   Final    RARE WBC PRESENT,BOTH  PMN AND MONONUCLEAR NO ORGANISMS SEEN Performed at [facility], [place] [address], [place], [place] [address]    Culture FEW KLEBSIELLA PNEUMONIAE  Final   Report Status 09/25/2020 FINAL  Final   Organism ID, Bacteria KLEBSIELLA PNEUMONIAE  Final      Susceptibility   Klebsiella pneumoniae - MIC*    AMPICILLIN >=32 RESISTANT Resistant     CEFAZOLIN <=4 SENSITIVE Sensitive     CEFEPIME 0.25 SENSITIVE Sensitive     CEFTAZIDIME <=1 SENSITIVE Sensitive     CEFTRIAXONE 1 SENSITIVE Sensitive     CIPROFLOXACIN <=0.25 SENSITIVE Sensitive     GENTAMICIN <=1 SENSITIVE Sensitive     IMIPENEM <=0.25 SENSITIVE Sensitive     TRIMETH/SULFA <=20 SENSITIVE Sensitive     AMPICILLIN/SULBACTAM 16 INTERMEDIATE Intermediate     PIP/TAZO 64 INTERMEDIATE Intermediate     * FEW KLEBSIELLA PNEUMONIAE  Culture, blood (routine x 2)     Status: None   Collection Time: 09/23/20  4:26 PM   Specimen: BLOOD  Result Value Ref Range Status   Specimen Description BLOOD BLOOD RIGHT HAND  Final   Special Requests   Final    AEROBIC BOTTLE ONLY Blood Culture results may not be optimal due to an inadequate volume of blood received in culture bottles   Culture   Final     NO GROWTH 5 DAYS Performed at [facility], [address], [place], [place] [address]    Report Status 09/28/2020 FINAL  Final  Culture, blood (single)     Status: None   Collection Time: 09/24/20  6:00 AM   Specimen: BLOOD RIGHT HAND  Result Value Ref Range Status   Specimen Description BLOOD RIGHT HAND  Final   Special Requests   Final    BOTTLES DRAWN AEROBIC ONLY Blood Culture results may not be optimal due to an inadequate volume of blood received in culture bottles   Culture   Final    NO GROWTH 5 DAYS Performed at [facility], [place] [address], [place], [place] [address]    Report Status 09/29/2020 FINAL  Final  Body fluid culture     Status: None   Collection Time: 09/29/20 12:27 PM   Specimen: PATH Cytology Peritoneal fluid  Result Value Ref Range Status   Specimen Description PERITONEAL FLUID  Final   Special Requests NONE  Final   Gram Stain   Final    WBC PRESENT,BOTH PMN AND MONONUCLEAR NO ORGANISMS SEEN CYTOSPIN SMEAR    Culture   Final    NO GROWTH 3 DAYS Performed at [facility], [address], [place], [place] [address]    Report Status 10/03/2020 FINAL  Final  Culture, blood (Routine X 2) w Reflex to ID Panel     Status: Abnormal   Collection Time: 10/04/20 12:50 PM   Specimen: BLOOD  Result Value Ref Range Status   Specimen Description BLOOD RIGHT ANTECUBITAL  Final   Special Requests   Final    BOTTLES DRAWN AEROBIC AND ANAEROBIC Blood Culture adequate volume   Culture  Setup Time   Final    GRAM POSITIVE COCCI IN CLUSTERS AEROBIC BOTTLE ONLY CRITICAL RESULT CALLED TO, READ BACK BY AND VERIFIED WITH: A. Grandville Silos RN 16:20 10/05/20 ([email or web address]) Performed at [facility], [place] [address], [place],  [address]    Culture STAPHYLOCOCCUS EPIDERMIDIS (A)  Final   Report Status 10/07/2020 FINAL  Final   Organism ID, Bacteria STAPHYLOCOCCUS  EPIDERMIDIS  Final      Susceptibility   Staphylococcus epidermidis - MIC*     CIPROFLOXACIN >=8 RESISTANT Resistant     ERYTHROMYCIN >=8 RESISTANT Resistant     GENTAMICIN >=16 RESISTANT Resistant     OXACILLIN >=4 RESISTANT Resistant     TETRACYCLINE 2 SENSITIVE Sensitive     VANCOMYCIN 2 SENSITIVE Sensitive     TRIMETH/SULFA 160 RESISTANT Resistant     CLINDAMYCIN >=8 RESISTANT Resistant     RIFAMPIN <=0.5 SENSITIVE Sensitive     Inducible Clindamycin NEGATIVE Sensitive     * STAPHYLOCOCCUS EPIDERMIDIS  Blood Culture ID Panel (Reflexed)     Status: Abnormal   Collection Time: 10/04/20 12:50 PM  Result Value Ref Range Status   Enterococcus faecalis NOT DETECTED NOT DETECTED Final   Enterococcus Faecium NOT DETECTED NOT DETECTED Final   Listeria monocytogenes NOT DETECTED NOT DETECTED Final   Staphylococcus species DETECTED (A) NOT DETECTED Final    Comment: CRITICAL RESULT CALLED TO, READ BACK BY AND VERIFIED WITH: A. Grandville Silos RN 16:20 10/05/20 ([email or web address])    Staphylococcus aureus (BCID) NOT DETECTED NOT DETECTED Final   Staphylococcus epidermidis DETECTED (A) NOT DETECTED Final    Comment: Methicillin (oxacillin) resistant coagulase negative staphylococcus. Possible blood culture contaminant (unless isolated from more than one blood culture draw or clinical case suggests pathogenicity). No antibiotic treatment is indicated for blood  culture contaminants. CRITICAL RESULT CALLED TO, READ BACK BY AND VERIFIED WITH: A. Grandville Silos RN 16:20 10/05/20 ([email or web address])    Staphylococcus lugdunensis NOT DETECTED NOT DETECTED Final   Streptococcus species NOT DETECTED NOT DETECTED Final   Streptococcus agalactiae NOT DETECTED NOT DETECTED Final   Streptococcus pneumoniae NOT DETECTED NOT DETECTED Final   Streptococcus pyogenes NOT DETECTED NOT DETECTED Final   A.calcoaceticus-baumannii NOT DETECTED NOT DETECTED Final   Bacteroides fragilis NOT DETECTED NOT DETECTED Final   Enterobacterales NOT DETECTED NOT DETECTED Final   Enterobacter cloacae complex NOT DETECTED NOT  DETECTED Final   Escherichia coli NOT DETECTED NOT DETECTED Final   Klebsiella aerogenes NOT DETECTED NOT DETECTED Final   Klebsiella oxytoca NOT DETECTED NOT DETECTED Final   Klebsiella pneumoniae NOT DETECTED NOT DETECTED Final   Proteus species NOT DETECTED NOT DETECTED Final   Salmonella species NOT DETECTED NOT DETECTED Final   Serratia marcescens NOT DETECTED NOT DETECTED Final   Haemophilus influenzae NOT DETECTED NOT DETECTED Final   Neisseria meningitidis NOT DETECTED NOT DETECTED Final   Pseudomonas aeruginosa NOT DETECTED NOT DETECTED Final   Stenotrophomonas maltophilia NOT DETECTED NOT DETECTED Final   Candida albicans NOT DETECTED NOT DETECTED Final   Candida auris NOT DETECTED NOT DETECTED Final   Candida glabrata NOT DETECTED NOT DETECTED Final   Candida krusei NOT DETECTED NOT DETECTED Final   Candida parapsilosis NOT DETECTED NOT DETECTED Final   Candida tropicalis NOT DETECTED NOT DETECTED Final   Cryptococcus neoformans/gattii NOT DETECTED NOT DETECTED Final   Methicillin resistance mecA/C DETECTED (A) NOT DETECTED Final    Comment: CRITICAL RESULT CALLED TO, READ BACK BY AND VERIFIED WITH: A. Grandville Silos RN 16:20 10/05/20 ([email or web address]) Performed at [facility], [place] [address] [place],  [address]   Culture, blood (Routine X 2) w Reflex to ID Panel     Status: Abnormal   Collection Time: 10/04/20 12:51 PM   Specimen: BLOOD RIGHT HAND  Result Value Ref Range Status   Specimen Description BLOOD RIGHT HAND  Final   Special Requests   Final  BOTTLES DRAWN AEROBIC AND ANAEROBIC Blood Culture adequate volume   Culture  Setup Time   Final    GRAM POSITIVE COCCI AEROBIC BOTTLE ONLY CRITICAL VALUE NOTED.  VALUE IS CONSISTENT WITH PREVIOUSLY REPORTED AND CALLED VALUE.    Culture (A)  Final    STAPHYLOCOCCUS EPIDERMIDIS SUSCEPTIBILITIES PERFORMED ON PREVIOUS CULTURE WITHIN THE LAST 5 DAYS. Performed at [facility], [place] [address], [place], [place]  [address]    Report Status 10/07/2020 FINAL  Final  C Difficile Quick Screen (NO PCR Reflex)     Status: None   Collection Time: 10/04/20  5:19 PM   Specimen: STOOL  Result Value Ref Range Status   C Diff antigen NEGATIVE NEGATIVE Final   C Diff toxin NEGATIVE NEGATIVE Final   C Diff interpretation No C. difficile detected.  Final    Comment: Performed at [facility], [place] [address] [place], [place] [address]  Culture, respiratory (non-expectorated)     Status: None   Collection Time: 10/05/20 10:14 PM   Specimen: Tracheal Aspirate; Respiratory  Result Value Ref Range Status   Specimen Description TRACHEAL ASPIRATE  Final   Special Requests NONE  Final   Gram Stain   Final    MODERATE WBC PRESENT, PREDOMINANTLY PMN MODERATE GRAM NEGATIVE RODS FEW YEAST RARE GRAM POSITIVE COCCI IN CHAINS Performed at [facility], [address] [place], [place] [address]    Culture MODERATE KLEBSIELLA PNEUMONIAE  Final   Report Status 10/08/2020 FINAL  Final   Organism ID, Bacteria KLEBSIELLA PNEUMONIAE  Final      Susceptibility   Klebsiella pneumoniae - MIC*    AMPICILLIN RESISTANT Resistant     CEFAZOLIN <=4 SENSITIVE Sensitive     CEFEPIME <=0.12 SENSITIVE Sensitive     CEFTAZIDIME <=1 SENSITIVE Sensitive     CEFTRIAXONE <=0.25 SENSITIVE Sensitive     CIPROFLOXACIN >=4 RESISTANT Resistant     GENTAMICIN <=1 SENSITIVE Sensitive     IMIPENEM <=0.25 SENSITIVE Sensitive     TRIMETH/SULFA <=20 SENSITIVE Sensitive     AMPICILLIN/SULBACTAM 8 SENSITIVE Sensitive     PIP/TAZO 16 SENSITIVE Sensitive     * MODERATE KLEBSIELLA PNEUMONIAE  Culture, blood (routine x 2)     Status: None (Preliminary result)   Collection Time: 10/08/20  4:17 PM   Specimen: BLOOD RIGHT WRIST  Result Value Ref Range Status   Specimen Description BLOOD RIGHT WRIST  Final   Special Requests   Final    BOTTLES DRAWN AEROBIC ONLY Blood Culture results may not be optimal due to an excessive volume of blood  received in culture bottles   Culture   Final    NO GROWTH 3 DAYS Performed at [facility], [address], [place], [place] [address]    Report Status PENDING  Incomplete  Culture, blood (routine x 2)     Status: None (Preliminary result)   Collection Time: 10/08/20  4:17 PM   Specimen: BLOOD RIGHT HAND  Result Value Ref Range Status   Specimen Description BLOOD RIGHT HAND  Final   Special Requests   Final    BOTTLES DRAWN AEROBIC ONLY Blood Culture results may not be optimal due to an excessive volume of blood received in culture bottles   Culture   Final    NO GROWTH 3 DAYS Performed at [facility], [place] [address] [place], [place] [address]    Report Status PENDING  Incomplete    Coagulation  Studies: No results for input(s): LABPROT, INR in the last 72 hours.  Urinalysis: No results for input(s): COLORURINE, LABSPEC, PHURINE, GLUCOSEU, HGBUR, BILIRUBINUR, KETONESUR, PROTEINUR, UROBILINOGEN, NITRITE, LEUKOCYTESUR in the last 72 hours.  Invalid input(s): APPERANCEUR    Imaging: DG CHEST PORT 1 VIEW  Result Date: 10/09/2020 CLINICAL DATA:  Pneumonia. EXAM: PORTABLE CHEST 1 VIEW COMPARISON:  Chest x-ray dated October 07, 2020. FINDINGS: Unchanged tracheostomy tube and enteric tube. Unchanged tunneled left internal jugular central venous catheter. Normal heart size. Unchanged collapse of the right lower lobe. Mildly improved aeration of the right middle lobe. Patchy opacities in the right upper lobe and medial left lower lobe have slightly improved. Unchanged small right pleural effusion. No pneumothorax. No acute osseous abnormality. IMPRESSION: 1. Mildly improved aeration of the right middle lobe. Unchanged right lower lobe collapse and small right pleural effusion. 2. Slightly improved right upper lobe and medial left lower lobe pneumonia. Electronically Signed   By: Titus Dubin M.D.   On: 10/09/2020 12:18   DG Abd Portable 1V  Result Date:  10/09/2020 CLINICAL DATA:  Gastric distension. EXAM: PORTABLE ABDOMEN - 1 VIEW COMPARISON:  Abdominal x-ray dated October 07, 2020. FINDINGS: Unchanged enteric and gastrostomy tubes. Unchanged cholecystostomy tube. Significantly decreased gastric distension. Nonobstructive bowel gas pattern. No acute osseous abnormality. IMPRESSION: 1. Significantly decreased gastric distension. Electronically Signed   By: Titus Dubin M.D.   On: 10/09/2020 12:19     Medications:     iohexol, lidocaine  Assessment/ Plan:  54 y.o. 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   1.  ESRD on HD. Patient seen during hemodialysis session. Tolerating well. Continue dialysis on MWF schedule.  2.  Hypotension. Maintain the patient on midodrine and albumin.  3.  Anemia of chronic kidney disease.   Lab Results  Component Value Date   HGB 8.0 (L) 10/11/2020  Hemoglobin currently 8.0. Continue Retacrit 10,000 units IV with dialysis.  4.  Secondary hyperparathyroidism. Phosphorus at target at 4.4. Continue to monitor.  5.  Acute respiratory failure. Patient maintained on ventilatory support. Weaning as per pulmonary/critical care.    LOS: 0 Buster Schueller 10/20/202110:47 AM

## 2020-10-11 NOTE — Progress Notes (Addendum)
Pulmonary Critical Care Medicine [place]   PULMONARY CRITICAL CARE SERVICE  PROGRESS NOTE  Date of Service: 10/11/2020  [place]  IPJ:[id number]  DOB: 07-20-1966   DOA: 09/22/2020  Referring Physician: Merton Border, MD  HPI: Jeremy Yu is a 54 y.o. male seen for follow up of Acute on Chronic Respiratory Failure.  Patient remains 28% T-bar at this time for 24-hour goal satting well no distress.  Medications: Reviewed on Rounds  Physical Exam:  Vitals: Pulse 102 respirations 24 BP 164/93 O2 sat 98% temp 99.0  Ventilator Settings not currently on ventilator  . General: Comfortable at this time . Eyes: Grossly normal lids, irises & conjunctiva . ENT: grossly tongue is normal . Neck: no obvious mass . Cardiovascular: S1 S2 normal no gallop . Respiratory: No rales or rhonchi noted . Abdomen: soft . Skin: no rash seen on limited exam . Musculoskeletal: not rigid . Psychiatric:unable to assess . Neurologic: no seizure no involuntary movements         Lab Data:   Basic Metabolic Panel: Recent Labs  Lab 10/05/20 1550 10/06/20 0426 10/09/20 0717 10/09/20 1436 10/11/20 0521  NA 135 132* 134*  --  130*  K 3.6 3.4* 3.1* 3.2* 3.5  CL 101 101 103  --  97*  CO2 21* 18* 21*  --  23  GLUCOSE 307* 326* 234*  --  219*  BUN 26* 32* 52*  --  52*  CREATININE 3.12* 3.44* 3.49*  --  2.99*  CALCIUM 8.9 8.8* 9.2  --  9.1  MG 2.0 1.9 2.0  --  1.7  PHOS 3.3 2.0* 4.2  --  4.4    ABG: Recent Labs  Lab 10/05/20 1314  PHART 7.288*  PCO2ART 44.3  PO2ART 55.4*  HCO3 20.9  O2SAT 87.6    Liver Function Tests: Recent Labs  Lab 10/05/20 1550 10/06/20 0426 10/09/20 0717 10/11/20 0521  AST  --  53*  --   --   ALT  --  14  --   --   ALKPHOS  --  180*  --   --   BILITOT  --  4.0*  --   --   PROT  --  7.4  --   --   ALBUMIN 2.1* 1.7* 1.6* 1.6*   No results for input(s): LIPASE, AMYLASE in the last 168 hours. No results for input(s): AMMONIA  in the last 168 hours.  CBC: Recent Labs  Lab 10/04/20 1250 10/05/20 0516 10/06/20 0845 10/07/20 0525 10/09/20 0717 10/09/20 2130 10/11/20 0521  WBC 21.8*   < > 21.6* 20.2* 22.1* 19.6* 16.7*  NEUTROABS 17.4*  --   --   --   --   --   --   HGB 7.9*   < > 7.0* 8.4* 7.8* 8.4* 8.0*  HCT 25.5*   < > 22.9* 26.8* 25.5* 26.2* 25.4*  MCV 92.7   < > 94.2 90.5 92.4 92.9 91.7  PLT 512*   < > 516* 484* 504* 513* 500*   < > = values in this interval not displayed.    Cardiac Enzymes: No results for input(s): CKTOTAL, CKMB, CKMBINDEX, TROPONINI in the last 168 hours.  BNP (last 3 results) No results for input(s): BNP in the last 8760 hours.  ProBNP (last 3 results) No results for input(s): PROBNP in the last 8760 hours.  Radiological Exams: DG CHEST PORT 1 VIEW  Result Date: 10/09/2020 CLINICAL DATA:  Pneumonia. EXAM: PORTABLE CHEST 1 VIEW COMPARISON:  Chest x-ray dated October 07, 2020. FINDINGS: Unchanged tracheostomy tube and enteric tube. Unchanged tunneled left internal jugular central venous catheter. Normal heart size. Unchanged collapse of the right lower lobe. Mildly improved aeration of the right middle lobe. Patchy opacities in the right upper lobe and medial left lower lobe have slightly improved. Unchanged small right pleural effusion. No pneumothorax. No acute osseous abnormality. IMPRESSION: 1. Mildly improved aeration of the right middle lobe. Unchanged right lower lobe collapse and small right pleural effusion. 2. Slightly improved right upper lobe and medial left lower lobe pneumonia. Electronically Signed   By: Titus Dubin M.D.   On: 10/09/2020 12:18   DG Abd Portable 1V  Result Date: 10/09/2020 CLINICAL DATA:  Gastric distension. EXAM: PORTABLE ABDOMEN - 1 VIEW COMPARISON:  Abdominal x-ray dated October 07, 2020. FINDINGS: Unchanged enteric and gastrostomy tubes. Unchanged cholecystostomy tube. Significantly decreased gastric distension. Nonobstructive bowel gas  pattern. No acute osseous abnormality. IMPRESSION: 1. Significantly decreased gastric distension. Electronically Signed   By: Titus Dubin M.D.   On: 10/09/2020 12:19    Assessment/Plan Active Problems:   Acute on chronic respiratory failure with hypoxia (HCC)   End stage renal failure on dialysis (HCC)   Severe sepsis with septic shock (CODE) (HCC)   Cardiac arrest (HCC)   Metabolic encephalopathy   1. Acute on chronic respiratory failure hypoxia  patient has a 24-hour goal today on aerosol trach collar will continue aggressive pulmonary toilet supportive measures. 2. End-stage renal failure on hemodialysis followed by nephrology 3. Severe sepsis with shock resolved hemodynamics are stable 4. Cardiac arrest rhythm has been stable 5. Metabolic encephalopathy no change we'll continue present management.   I have personally seen and evaluated the patient, evaluated laboratory and imaging results, formulated the assessment and plan and placed orders. The Patient requires high complexity decision making with multiple systems involvement.  Rounds were done with the Respiratory Therapy Director and Staff therapists and discussed with nursing staff also.  Allyne Gee, MD [facility] Pulmonary Critical Care Medicine Sleep Medicine

## 2020-10-12 ENCOUNTER — Other Ambulatory Visit (HOSPITAL_COMMUNITY): Payer: Medicare Other

## 2020-10-12 DIAGNOSIS — J9621 Acute and chronic respiratory failure with hypoxia: Secondary | ICD-10-CM | POA: Diagnosis not present

## 2020-10-12 DIAGNOSIS — I469 Cardiac arrest, cause unspecified: Secondary | ICD-10-CM | POA: Diagnosis not present

## 2020-10-12 DIAGNOSIS — N186 End stage renal disease: Secondary | ICD-10-CM | POA: Diagnosis not present

## 2020-10-12 DIAGNOSIS — G9341 Metabolic encephalopathy: Secondary | ICD-10-CM | POA: Diagnosis not present

## 2020-10-12 LAB — BLOOD GAS, ARTERIAL
Acid-Base Excess: 0.6 mmol/L (ref 0.0–2.0)
Bicarbonate: 25.3 mmol/L (ref 20.0–28.0)
FIO2: 30
O2 Saturation: 96.1 %
Patient temperature: 36.8
pCO2 arterial: 44.5 mmHg (ref 32.0–48.0)
pH, Arterial: 7.372 (ref 7.350–7.450)
pO2, Arterial: 80.9 mmHg — ABNORMAL LOW (ref 83.0–108.0)

## 2020-10-12 NOTE — Progress Notes (Addendum)
Pulmonary Critical Care Medicine [place]   PULMONARY CRITICAL CARE SERVICE  PROGRESS NOTE  Date of Service: 10/12/2020  Jeremy Yu  QQP:[id number]  DOB: [birth year]   DOA: 09/22/2020  Referring Physician: Merton Border, MD  HPI: Jeremy Yu is a 54 y.o. male seen for follow up of Acute on Chronic Respiratory Failure.  Patient currently is on T collar has been on 20% FiO2 the goal today is for 24 hours  Medications: Reviewed on Rounds  Physical Exam:  Vitals: Temperature is 98.9 pulse 107 respiratory rate 20 blood pressure is 114/70 saturations 98%  Ventilator Settings on T collar FiO2 28%  . General: Comfortable at this time . Eyes: Grossly normal lids, irises & conjunctiva . ENT: grossly tongue is normal . Neck: no obvious mass . Cardiovascular: S1 S2 normal no gallop . Respiratory: No rhonchi no rales are noted at this time . Abdomen: soft . Skin: no rash seen on limited exam . Musculoskeletal: not rigid . Psychiatric:unable to assess . Neurologic: no seizure no involuntary movements         Lab Data:   Basic Metabolic Panel: Recent Labs  Lab 10/05/20 1550 10/06/20 0426 10/09/20 0717 10/09/20 1436 10/11/20 0521  NA 135 132* 134*  --  130*  K 3.6 3.4* 3.1* 3.2* 3.5  CL 101 101 103  --  97*  CO2 21* 18* 21*  --  23  GLUCOSE 307* 326* 234*  --  219*  BUN 26* 32* 52*  --  52*  CREATININE 3.12* 3.44* 3.49*  --  2.99*  CALCIUM 8.9 8.8* 9.2  --  9.1  MG 2.0 1.9 2.0  --  1.7  PHOS 3.3 2.0* 4.2  --  4.4    ABG: Recent Labs  Lab 10/05/20 1314  PHART 7.288*  PCO2ART 44.3  PO2ART 55.4*  HCO3 20.9  O2SAT 87.6    Liver Function Tests: Recent Labs  Lab 10/05/20 1550 10/06/20 0426 10/09/20 0717 10/11/20 0521  AST  --  53*  --   --   ALT  --  14  --   --   ALKPHOS  --  180*  --   --   BILITOT  --  4.0*  --   --   PROT  --  7.4  --   --   ALBUMIN 2.1* 1.7* 1.6* 1.6*   No results for input(s): LIPASE, AMYLASE in the last  168 hours. No results for input(s): AMMONIA in the last 168 hours.  CBC: Recent Labs  Lab 10/06/20 0845 10/07/20 0525 10/09/20 0717 10/09/20 2130 10/11/20 0521  WBC 21.6* 20.2* 22.1* 19.6* 16.7*  HGB 7.0* 8.4* 7.8* 8.4* 8.0*  HCT 22.9* 26.8* 25.5* 26.2* 25.4*  MCV 94.2 90.5 92.4 92.9 91.7  PLT 516* 484* 504* 513* 500*    Cardiac Enzymes: No results for input(s): CKTOTAL, CKMB, CKMBINDEX, TROPONINI in the last 168 hours.  BNP (last 3 results) No results for input(s): BNP in the last 8760 hours.  ProBNP (last 3 results) No results for input(s): PROBNP in the last 8760 hours.  Radiological Exams: No results found.  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 Acute on chronic respiratory failure hypoxia we will continue with T collar 24-hour goal 2. End-stage renal failure on hemodialysis being  followed by nephrology 3. Severe sepsis with shock resolved 4. Cardiac arrest rhythm has been stable 5. Metabolic encephalopathy slow improvement   I have personally seen and evaluated the patient, evaluated laboratory and imaging results, formulated the assessment and plan and placed orders. The Patient requires high complexity decision making with multiple systems involvement.  Rounds were done with the Respiratory Therapy Director and Staff therapists and discussed with nursing staff also.  Allyne Gee, MD [facility] Pulmonary Critical Care Medicine Sleep Medicine

## 2020-10-12 NOTE — Progress Notes (Signed)
PROGRESS NOTE    Jeremy Yu  WIO:[id number] DOB: 24-Jan-1966 DOA: 09/22/2020  Brief Narrative:  Jeremy Yu is an 54 y.o. male with medical history significant of end-stage renal disease on hemodialysis, diabetic nephropathy, chronic systolic and diastolic congestive heart failure, peripheral arterial disease, complete heart block, seizures, hyperlipidemia, hypertension, prior MI, GERD who had recent hospitalization from 07/17/2020 until 07/25/2020 for necrotizing soft tissue infection, sepsis.  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  He was discharged on 07/25/2020.  Patient apparently was at [facility] and rehab facility.  While undergoing dialysis on 08/20/2019 when he developed chills, rigors.  Blood cultures were taken and he was admitted to [facility] on 08/19/2020.  He was given IV vancomycin, ceftriaxone for probable sepsis.  He was found to have diarrhea and stool for C. difficile was positive.  Patient was placed on oral vancomycin.  CT on 08/20/2020 confirmed pancolitis with large left inguinal hernia, airspace disease with concern for pneumonia.  On 08/22/2019 when he became hypotensive, both IV and p.o. Flagyl was added to the p.o. vancomycin.  Infectious disease was consulted with addition of Dificid.  However, patient continued to worsen.  On 08/24/2019 when he was transferred urgently to the ICU and intubated for airway protection because he was becoming encephalopathic.  He was also given vasopressors for shock.  Fecal transplant was attempted by GI at the bedside however, inability to pass the scope due to large nonreducible inguinal hernia.  Surgery was consulted and patient was taken emergently for subtotal colectomy and ileostomy.  Eravacycline was added for C. difficile/toxic megacolon.   He was seen by surgery with clearance for tube feeds on 08/25/2020.  Patient postoperatively remained encephalopathic.  Gradually mental status improved.  However, he had significant drop in platelet count on 08/27/2020.  Hematology was consulted and they thought the thrombocytopenia was likely secondary to sepsis, recent surgery, critical illness, DIC.  He was given platelet transfusion with additional PRBC due to oozing from the surgical site, drop in hemoglobin levels.  He was also given vitamin K for ongoing liver dysfunction.  He apparently received total of 8 units platelets.  Repeat CT was done which showed left lingular consolidation, bilateral effusions.  Abdomen showed postop free fluid, gallstones.  He had progressive thrombocytopenia, abnormal LFTs therefore Flagyl, Eravacycline was stopped.  He had blood cultures on 08/28/2020 that showed staph.  He was started on IV vancomycin, Zosyn.  He was also treated with micafungin.  On 09/04/2020 patient had PEA arrest during which she was intubated, subsequently underwent EGD with evidence of bleeding ulcer, gastritis.  He continued to have oozing from his ileostomy and was receiving blood products.  He was placed on PPI drip.  Right upper quadrant ultrasound showed possible cholecystitis.  He was to undergo drain placement but underwent HIDA on 09/09/2020, gallbladder was not visualized.  On 09/11/2019 when he had large amount of blood from the stoma and received PRBC.  He had EGD with bleeding ulcer that was clipped.  On 09/12/2019 when he had paracentesis with 5.2 L removed.  Patient had fluid cultures grew VRE for which he was treated with daptomycin for 14 days.  He failed ventilator weaning efforts therefore tracheostomy was done on 09/14/2020.  He has unstageable sacral pressure ulcer for which he received wound care. He was transferred and admitted to [facility]  on 09/22/2020. -He has trach, on vent, on 35% FiO2.  After admission here he started  having increasing secretions.  Respiratory cultures showed Klebsiella.  He received treatment with ciprofloxacin, Flagyl. 10/05/2020: Patient had worsening leukocytosis.  He was also hypotensive per the primary team and started on Levophed drip.  He underwent paracentesis last week with removal of 800 cc.  KUB per report progressive dilatation of the stomach and small bowel. Possible small bowel obstruction.  Preliminary blood cultures from 10/04/2020 showing gram-positive cocci in 1 bottle. 10/12/2020: He is currently on treatment with IV vancomycin, meropenem, Flagyl for C. difficile which are ending soon.  He remains afebrile, stable at this time.  Being started on trickle tube feeds.   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  He had severe sepsis with septic shock at the acute facility.  He also had PEA arrest at the outside facility and had to be intubated.  He had pneumonia with Klebsiella.  He received treatment with ciprofloxacin, metronidazole.  However, after that had worsening leukocytosis with chest x-ray findings consistent for persistent right lower lobe pneumonia.  Therefore switched to IV meropenem, IV vancomycin empirically, Flagyl added for C. difficile prophylaxis.  Pulmonary following.  Further management, weaning per pulmonary and  primary team.  Sepsis with shock: Patient was on pressors at the outside facility.  Last week he was hypotensive and started on pressors again.  He had blood cultures shock is resolved.  However, this afternoon patient became hypotensive and started on pressors again.  Preliminary blood cultures showing staph epidermidis.  Repeat cultures from 10/08/2020 did not show any growth.  Being treated with IV vancomycin, meropenem.  Given his severe C. difficile colitis also started on IV Flagyl.  There was concern for bowel obstruction on the KUB therefore CT was done.  He had KUB on 10/09/2020 which per report showing significantly decreased gastric distention.  Per primary team he has been started on trickle feeds. suggest to start p.o. vancomycin as soon as he is able to tolerate p.o. He is completing antibiotics.  Appears to be stable at this time.  However, he is high risk for recurrent sepsis.  If he starts having fevers or leukocytosis recommend to send for repeat cultures and restart him on empiric antibiotics.  Leukocytosis: Patient previously had worsening leukocytosis.  Treated with antibiotics as mentioned above.  Now nearing the end of his antibiotics.  He is afebrile at this time.  WBC count trending down.  Continue to monitor closely.  As mentioned above, he is high risk for recurrent sepsis.  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 Continue local wound care.  Antibiotics as mentioned above.  Recommend to start p.o. vancomycin as soon as he is able to tolerate oral for C. difficile prophylaxis.  Peritonitis: Patient had peritonitis at the acute facility with peritoneal fluid cultures that showed VRE.  He status post treatment with 14 days of daptomycin.  Previous CT of the abdomen  and pelvis from here showed large volume ascites. He is status post paracentesis.  Gram stain showing WBC with PMN.  However, was unable to find the cell  count/differential on the fluid.  Cultures remain negative.  Regardless he has received treatment with empiric antibiotics.  He is at high risk for recurrent peritonitis we will continue to monitor closely.  Cholecystitis: He also had cholecystitis and is status post drain placement.  Antibiotics as mentioned above.  End-stage renal disease on dialysis: Dialysis per nephrology.  Antibiotics renally dosed.  Necrotizing fasciitis status post right AKA: Continue local wound care.  Diabetes mellitus: Continue to monitor Accu-Cheks, medications and management of diabetes per the primary team.  He will need proper glycemic control in order to enable healing.  Bleeding ulcer/gastritis: Status post EGD and clipping at the acute facility.  On PPI.  Continue to monitor.  Further management per primary team.  Sacrococcygeal pressure ulcer unstageable: Continue local wound care.  If it is worsening consider consulting surgery for possible debridement.  Thrombocytopenia: Patient had severe thrombocytopenia at the outside facility and received platelet transfusion.  Currently count improved. Continue to monitor platelets closely.  Anemia: Continue to monitor hemoglobin.  Further management per the primary team.  Unfortunately due to his complex medical problems he is high risk for worsening and decompensation.  Plan of care discussed with the patient, primary team and pharmacy.  Subjective: He is awake, able to follow few commands.  On vent.  Objective: Vitals: Temperature 98.8, pulse 101, respiratory 28, blood pressure 127/70, pulse oximetry 100%  Examination: Constitutional: Chronically ill-appearing male Head: Atraumatic, normocephalic Eyes: pupils equal and reactive, legally blind  ENMT: external ears and nose appear normal, normal hearing, Lips appear normal, moist oral mucosa Neck: Has trach in place CVS: S1-S2  Respiratory: Rhonchi, no wheezing Abdomen: Distended but soft,  hypoactive bowel sounds, right-sided drain, ostomy, PEG tube, diminished bowel sounds Musculoskeletal: Right lower extremity AKA Neuro: Legally blind, has debility with generalized weakness Psych: stable mood Skin: no rashes     Data Reviewed: I have personally reviewed following labs and imaging studies  CBC: Recent Labs  Lab 10/06/20 0845 10/07/20 0525 10/09/20 0717 10/09/20 2130 10/11/20 0521  WBC 21.6* 20.2* 22.1* 19.6* 16.7*  HGB 7.0* 8.4* 7.8* 8.4* 8.0*  HCT 22.9* 26.8* 25.5* 26.2* 25.4*  MCV 94.2 90.5 92.4 92.9 91.7  PLT 516* 484* 504* 513* 500*    Basic Metabolic Panel: Recent Labs  Lab 10/06/20 0426 10/09/20 0717 10/09/20 1436 10/11/20 0521  NA 132* 134*  --  130*  K 3.4* 3.1* 3.2* 3.5  CL 101 103  --  97*  CO2 18* 21*  --  23  GLUCOSE 326* 234*  --  219*  BUN 32* 52*  --  52*  CREATININE 3.44* 3.49*  --  2.99*  CALCIUM 8.8* 9.2  --  9.1  MG 1.9 2.0  --  1.7  PHOS 2.0* 4.2  --  4.4    GFR: CrCl cannot be calculated (Unknown ideal weight.).  Liver Function Tests: Recent Labs  Lab 10/06/20 0426 10/09/20 0717 10/11/20 0521  AST 53*  --   --   ALT 14  --   --   ALKPHOS 180*  --   --   BILITOT 4.0*  --   --   PROT 7.4  --   --   ALBUMIN 1.7* 1.6* 1.6*    CBG: No results for input(s): GLUCAP in the last 168 hours.   Recent  Results (from the past 240 hour(s))  Culture, blood (Routine X 2) w Reflex to ID Panel     Status: Abnormal   Collection Time: 10/04/20 12:50 PM   Specimen: BLOOD  Result Value Ref Range Status   Specimen Description BLOOD RIGHT ANTECUBITAL  Final   Special Requests   Final    BOTTLES DRAWN AEROBIC AND ANAEROBIC Blood Culture adequate volume   Culture  Setup Time   Final    GRAM POSITIVE COCCI IN CLUSTERS AEROBIC BOTTLE ONLY CRITICAL RESULT CALLED TO, READ BACK BY AND VERIFIED WITH: A. Grandville Silos RN 16:20 10/05/20 ([email or web address]) Performed at [facility], [place] [address], [place], [place] [address]    Culture  STAPHYLOCOCCUS EPIDERMIDIS (A)  Final   Report Status 10/07/2020 FINAL  Final   Organism ID, Bacteria STAPHYLOCOCCUS EPIDERMIDIS  Final      Susceptibility   Staphylococcus epidermidis - MIC*    CIPROFLOXACIN >=8 RESISTANT Resistant     ERYTHROMYCIN >=8 RESISTANT Resistant     GENTAMICIN >=16 RESISTANT Resistant     OXACILLIN >=4 RESISTANT Resistant     TETRACYCLINE 2 SENSITIVE Sensitive     VANCOMYCIN 2 SENSITIVE Sensitive     TRIMETH/SULFA 160 RESISTANT Resistant     CLINDAMYCIN >=8 RESISTANT Resistant     RIFAMPIN <=0.5 SENSITIVE Sensitive     Inducible Clindamycin NEGATIVE Sensitive     * STAPHYLOCOCCUS EPIDERMIDIS  Blood Culture ID Panel (Reflexed)     Status: Abnormal   Collection Time: 10/04/20 12:50 PM  Result Value Ref Range Status   Enterococcus faecalis NOT DETECTED NOT DETECTED Final   Enterococcus Faecium NOT DETECTED NOT DETECTED Final   Listeria monocytogenes NOT DETECTED NOT DETECTED Final   Staphylococcus species DETECTED (A) NOT DETECTED Final    Comment: CRITICAL RESULT CALLED TO, READ BACK BY AND VERIFIED WITH: A. Grandville Silos RN 16:20 10/05/20 ([email or web address])    Staphylococcus aureus (BCID) NOT DETECTED NOT DETECTED Final   Staphylococcus epidermidis DETECTED (A) NOT DETECTED Final    Comment: Methicillin (oxacillin) resistant coagulase negative staphylococcus. Possible blood culture contaminant (unless isolated from more than one blood culture draw or clinical case suggests pathogenicity). No antibiotic treatment is indicated for blood  culture contaminants. CRITICAL RESULT CALLED TO, READ BACK BY AND VERIFIED WITH: A. Grandville Silos RN 16:20 10/05/20 ([email or web address])    Staphylococcus lugdunensis NOT DETECTED NOT DETECTED Final   Streptococcus species NOT DETECTED NOT DETECTED Final   Streptococcus agalactiae NOT DETECTED NOT DETECTED Final   Streptococcus pneumoniae NOT DETECTED NOT DETECTED Final   Streptococcus pyogenes NOT DETECTED NOT DETECTED Final    A.calcoaceticus-baumannii NOT DETECTED NOT DETECTED Final   Bacteroides fragilis NOT DETECTED NOT DETECTED Final   Enterobacterales NOT DETECTED NOT DETECTED Final   Enterobacter cloacae complex NOT DETECTED NOT DETECTED Final   Escherichia coli NOT DETECTED NOT DETECTED Final   Klebsiella aerogenes NOT DETECTED NOT DETECTED Final   Klebsiella oxytoca NOT DETECTED NOT DETECTED Final   Klebsiella pneumoniae NOT DETECTED NOT DETECTED Final   Proteus species NOT DETECTED NOT DETECTED Final   Salmonella species NOT DETECTED NOT DETECTED Final   Serratia marcescens NOT DETECTED NOT DETECTED Final   Haemophilus influenzae NOT DETECTED NOT DETECTED Final   Neisseria meningitidis NOT DETECTED NOT DETECTED Final   Pseudomonas aeruginosa NOT DETECTED NOT DETECTED Final   Stenotrophomonas maltophilia NOT DETECTED NOT DETECTED Final   Candida albicans NOT DETECTED NOT DETECTED Final   Candida auris NOT DETECTED NOT DETECTED Final   Candida glabrata  NOT DETECTED NOT DETECTED Final   Candida krusei NOT DETECTED NOT DETECTED Final   Candida parapsilosis NOT DETECTED NOT DETECTED Final   Candida tropicalis NOT DETECTED NOT DETECTED Final   Cryptococcus neoformans/gattii NOT DETECTED NOT DETECTED Final   Methicillin resistance mecA/C DETECTED (A) NOT DETECTED Final    Comment: CRITICAL RESULT CALLED TO, READ BACK BY AND VERIFIED WITH: A. Janee Morn RN 16:20 10/05/20 ([email or web address]) Performed at [facility], [address] [place], [place] [address]   Culture, blood (Routine X 2) w Reflex to ID Panel     Status: Abnormal   Collection Time: 10/04/20 12:51 PM   Specimen: BLOOD RIGHT HAND  Result Value Ref Range Status   Specimen Description BLOOD RIGHT HAND  Final   Special Requests   Final    BOTTLES DRAWN AEROBIC AND ANAEROBIC Blood Culture adequate volume   Culture  Setup Time   Final    GRAM POSITIVE COCCI AEROBIC BOTTLE ONLY CRITICAL VALUE NOTED.  VALUE IS CONSISTENT WITH PREVIOUSLY REPORTED  AND CALLED VALUE.    Culture (A)  Final    STAPHYLOCOCCUS EPIDERMIDIS SUSCEPTIBILITIES PERFORMED ON PREVIOUS CULTURE WITHIN THE LAST 5 DAYS. Performed at [facility], [address] [place], [place] [address]    Report Status 10/07/2020 FINAL  Final  C Difficile Quick Screen (NO PCR Reflex)     Status: None   Collection Time: 10/04/20  5:19 PM   Specimen: STOOL  Result Value Ref Range Status   C Diff antigen NEGATIVE NEGATIVE Final   C Diff toxin NEGATIVE NEGATIVE Final   C Diff interpretation No C. difficile detected.  Final    Comment: Performed at [facility], [address], [place], [place] [address]  Culture, respiratory (non-expectorated)     Status: None   Collection Time: 10/05/20 10:14 PM   Specimen: Tracheal Aspirate; Respiratory  Result Value Ref Range Status   Specimen Description TRACHEAL ASPIRATE  Final   Special Requests NONE  Final   Gram Stain   Final    MODERATE WBC PRESENT, PREDOMINANTLY PMN MODERATE GRAM NEGATIVE RODS FEW YEAST RARE GRAM POSITIVE COCCI IN CHAINS Performed at [facility], [address], [place], [place] [address]    Culture MODERATE KLEBSIELLA PNEUMONIAE  Final   Report Status 10/08/2020 FINAL  Final   Organism ID, Bacteria KLEBSIELLA PNEUMONIAE  Final      Susceptibility   Klebsiella pneumoniae - MIC*    AMPICILLIN RESISTANT Resistant     CEFAZOLIN <=4 SENSITIVE Sensitive     CEFEPIME <=0.12 SENSITIVE Sensitive     CEFTAZIDIME <=1 SENSITIVE Sensitive     CEFTRIAXONE <=0.25 SENSITIVE Sensitive     CIPROFLOXACIN >=4 RESISTANT Resistant     GENTAMICIN <=1 SENSITIVE Sensitive     IMIPENEM <=0.25 SENSITIVE Sensitive     TRIMETH/SULFA <=20 SENSITIVE Sensitive     AMPICILLIN/SULBACTAM 8 SENSITIVE Sensitive     PIP/TAZO 16 SENSITIVE Sensitive     * MODERATE KLEBSIELLA PNEUMONIAE  Culture, blood (routine x 2)     Status: None (Preliminary result)   Collection Time: 10/08/20  4:17 PM   Specimen: BLOOD RIGHT WRIST   Result Value Ref Range Status   Specimen Description BLOOD RIGHT WRIST  Final   Special Requests   Final    BOTTLES DRAWN AEROBIC ONLY Blood Culture results may not be optimal due to an excessive volume of blood received in culture bottles   Culture   Final  NO GROWTH 4 DAYS Performed at [facility], [place] [address], [place], [place] [address]    Report Status PENDING  Incomplete  Culture, blood (routine x 2)     Status: None (Preliminary result)   Collection Time: 10/08/20  4:17 PM   Specimen: BLOOD RIGHT HAND  Result Value Ref Range Status   Specimen Description BLOOD RIGHT HAND  Final   Special Requests   Final    BOTTLES DRAWN AEROBIC ONLY Blood Culture results may not be optimal due to an excessive volume of blood received in culture bottles   Culture   Final    NO GROWTH 4 DAYS Performed at [facility], [place] [address], [place], [place] [address]    Report Status PENDING  Incomplete      Radiology Studies: No results found.  Scheduled Meds: Please see MAR   Yaakov Guthrie, MD  10/12/2020, 5:36 PM

## 2020-10-13 DIAGNOSIS — J9621 Acute and chronic respiratory failure with hypoxia: Secondary | ICD-10-CM | POA: Diagnosis not present

## 2020-10-13 DIAGNOSIS — N186 End stage renal disease: Secondary | ICD-10-CM | POA: Diagnosis not present

## 2020-10-13 DIAGNOSIS — I469 Cardiac arrest, cause unspecified: Secondary | ICD-10-CM | POA: Diagnosis not present

## 2020-10-13 DIAGNOSIS — G9341 Metabolic encephalopathy: Secondary | ICD-10-CM | POA: Diagnosis not present

## 2020-10-13 LAB — RENAL FUNCTION PANEL
Albumin: 1.8 g/dL — ABNORMAL LOW (ref 3.5–5.0)
Anion gap: 9 (ref 5–15)
BUN: 43 mg/dL — ABNORMAL HIGH (ref 6–20)
CO2: 23 mmol/L (ref 22–32)
Calcium: 9.2 mg/dL (ref 8.9–10.3)
Chloride: 96 mmol/L — ABNORMAL LOW (ref 98–111)
Creatinine, Ser: 2.88 mg/dL — ABNORMAL HIGH (ref 0.61–1.24)
GFR, Estimated: 25 mL/min — ABNORMAL LOW (ref >60)
Glucose, Bld: 218 mg/dL — ABNORMAL HIGH (ref 70–99)
Phosphorus: 5.8 mg/dL — ABNORMAL HIGH (ref 2.5–4.6)
Potassium: 4.2 mmol/L (ref 3.5–5.1)
Sodium: 128 mmol/L — ABNORMAL LOW (ref 135–145)

## 2020-10-13 LAB — CULTURE, BLOOD (ROUTINE X 2)
Culture: NO GROWTH
Culture: NO GROWTH

## 2020-10-13 LAB — CBC
HCT: 27.3 % — ABNORMAL LOW (ref 39.0–52.0)
Hemoglobin: 8.3 g/dL — ABNORMAL LOW (ref 13.0–17.0)
MCH: 28.1 pg (ref 26.0–34.0)
MCHC: 30.4 g/dL (ref 30.0–36.0)
MCV: 92.5 fL (ref 80.0–100.0)
Platelets: 660 10*3/uL — ABNORMAL HIGH (ref 150–400)
RBC: 2.95 MIL/uL — ABNORMAL LOW (ref 4.22–5.81)
RDW: 22.5 % — ABNORMAL HIGH (ref 11.5–15.5)
WBC: 19.2 10*3/uL — ABNORMAL HIGH (ref 4.0–10.5)
nRBC: 0.3 % — ABNORMAL HIGH (ref 0.0–0.2)

## 2020-10-13 LAB — VANCOMYCIN, TROUGH: Vancomycin Tr: 27 ug/mL (ref 15–20)

## 2020-10-13 LAB — MAGNESIUM: Magnesium: 2.1 mg/dL (ref 1.7–2.4)

## 2020-10-13 LAB — TRIGLYCERIDES: Triglycerides: 166 mg/dL — ABNORMAL HIGH (ref <150)

## 2020-10-13 NOTE — Progress Notes (Signed)
[facility]  ROUNDING NOTE   Subjective:  Patient seen and evaluated during hemodialysis treatment today. Ultrafiltration target 1.5 kg net today.  Objective:  Vital signs in last 24 hours:  Temperature 98.2 pulse 103 respirations 22 blood pressure 141/83   Physical Exam: General:  Chronically ill-appearing  Head:  Normocephalic, atraumatic. Moist oral mucosal membranes  Eyes:  Anicteric  Neck:  Tracheostomy in place  Lungs:   Coarse breath sounds bilateral, normal effort  Heart:  S1S2 no rubs  Abdomen:   Soft, nontender, bowel sounds present, PEG in place, colostomy  Extremities:  Right AKA  Neurologic:  Lethargic but arousable  Skin:  No lesions  Access:  Left upper extremity AV fistula    Basic Metabolic Panel: Recent Labs  Lab 10/09/20 0717 10/09/20 1436 10/11/20 0521 10/13/20 0609  NA 134*  --  130* 128*  K 3.1* 3.2* 3.5 4.2  CL 103  --  97* 96*  CO2 21*  --  23 23  GLUCOSE 234*  --  219* 218*  BUN 52*  --  52* 43*  CREATININE 3.49*  --  2.99* 2.88*  CALCIUM 9.2  --  9.1 9.2  MG 2.0  --  1.7 2.1  PHOS 4.2  --  4.4 5.8*    Liver Function Tests: Recent Labs  Lab 10/09/20 0717 10/11/20 0521 10/13/20 0609  ALBUMIN 1.6* 1.6* 1.8*   No results for input(s): LIPASE, AMYLASE in the last 168 hours. No results for input(s): AMMONIA in the last 168 hours.  CBC: Recent Labs  Lab 10/07/20 0525 10/09/20 0717 10/09/20 2130 10/11/20 0521 10/13/20 0609  WBC 20.2* 22.1* 19.6* 16.7* 19.2*  HGB 8.4* 7.8* 8.4* 8.0* 8.3*  HCT 26.8* 25.5* 26.2* 25.4* 27.3*  MCV 90.5 92.4 92.9 91.7 92.5  PLT 484* 504* 513* 500* 660*    Cardiac Enzymes: No results for input(s): CKTOTAL, CKMB, CKMBINDEX, TROPONINI in the last 168 hours.  BNP: Invalid input(s): POCBNP  CBG: No results for input(s): GLUCAP in the last 168 hours.  Microbiology: Results for orders placed or performed during the hospital encounter of 09/22/20  Culture, respiratory  (non-expectorated)     Status: None   Collection Time: 09/23/20 11:25 AM   Specimen: Tracheal Aspirate; Respiratory  Result Value Ref Range Status   Specimen Description TRACHEAL ASPIRATE  Final   Special Requests NONE  Final   Gram Stain   Final    RARE WBC PRESENT,BOTH PMN AND MONONUCLEAR NO ORGANISMS SEEN Performed at [facility], [address] [place], [place] [address]    Culture FEW KLEBSIELLA PNEUMONIAE  Final   Report Status 09/25/2020 FINAL  Final   Organism ID, Bacteria KLEBSIELLA PNEUMONIAE  Final      Susceptibility   Klebsiella pneumoniae - MIC*    AMPICILLIN >=32 RESISTANT Resistant     CEFAZOLIN <=4 SENSITIVE Sensitive     CEFEPIME 0.25 SENSITIVE Sensitive     CEFTAZIDIME <=1 SENSITIVE Sensitive     CEFTRIAXONE 1 SENSITIVE Sensitive     CIPROFLOXACIN <=0.25 SENSITIVE Sensitive     GENTAMICIN <=1 SENSITIVE Sensitive     IMIPENEM <=0.25 SENSITIVE Sensitive     TRIMETH/SULFA <=20 SENSITIVE Sensitive     AMPICILLIN/SULBACTAM 16 INTERMEDIATE Intermediate     PIP/TAZO 64 INTERMEDIATE Intermediate     * FEW KLEBSIELLA PNEUMONIAE  Culture, blood (routine x 2)     Status: None   Collection Time: 09/23/20  4:26 PM   Specimen: BLOOD  Result Value  Ref Range Status   Specimen Description BLOOD BLOOD RIGHT HAND  Final   Special Requests   Final    AEROBIC BOTTLE ONLY Blood Culture results may not be optimal due to an inadequate volume of blood received in culture bottles   Culture   Final    NO GROWTH 5 DAYS Performed at [facility], [place] [address], [place], [place] [address]    Report Status 09/28/2020 FINAL  Final  Culture, blood (single)     Status: None   Collection Time: 09/24/20  6:00 AM   Specimen: BLOOD RIGHT HAND  Result Value Ref Range Status   Specimen Description BLOOD RIGHT HAND  Final   Special Requests   Final    BOTTLES DRAWN AEROBIC ONLY Blood Culture results may not be optimal due to an inadequate volume of blood received in culture  bottles   Culture   Final    NO GROWTH 5 DAYS Performed at [facility], [place] [address] [place], [place] [address]    Report Status 09/29/2020 FINAL  Final  Body fluid culture     Status: None   Collection Time: 09/29/20 12:27 PM   Specimen: PATH Cytology Peritoneal fluid  Result Value Ref Range Status   Specimen Description PERITONEAL FLUID  Final   Special Requests NONE  Final   Gram Stain   Final    WBC PRESENT,BOTH PMN AND MONONUCLEAR NO ORGANISMS SEEN CYTOSPIN SMEAR    Culture   Final    NO GROWTH 3 DAYS Performed at [facility], [address] [place],  [address]    Report Status 10/03/2020 FINAL  Final  Culture, blood (Routine X 2) w Reflex to ID Panel     Status: Abnormal   Collection Time: 10/04/20 12:50 PM   Specimen: BLOOD  Result Value Ref Range Status   Specimen Description BLOOD RIGHT ANTECUBITAL  Final   Special Requests   Final    BOTTLES DRAWN AEROBIC AND ANAEROBIC Blood Culture adequate volume   Culture  Setup Time   Final    GRAM POSITIVE COCCI IN CLUSTERS AEROBIC BOTTLE ONLY CRITICAL RESULT CALLED TO, READ BACK BY AND VERIFIED WITH: A. Grandville Silos RN 16:20 10/05/20 ([email or web address]) Performed at [facility], [place] [address] [place], [place] [address]    Culture STAPHYLOCOCCUS EPIDERMIDIS (A)  Final   Report Status 10/07/2020 FINAL  Final   Organism ID, Bacteria STAPHYLOCOCCUS EPIDERMIDIS  Final      Susceptibility   Staphylococcus epidermidis - MIC*    CIPROFLOXACIN >=8 RESISTANT Resistant     ERYTHROMYCIN >=8 RESISTANT Resistant     GENTAMICIN >=16 RESISTANT Resistant     OXACILLIN >=4 RESISTANT Resistant     TETRACYCLINE 2 SENSITIVE Sensitive     VANCOMYCIN 2 SENSITIVE Sensitive     TRIMETH/SULFA 160 RESISTANT Resistant     CLINDAMYCIN >=8 RESISTANT Resistant     RIFAMPIN <=0.5 SENSITIVE Sensitive     Inducible Clindamycin NEGATIVE Sensitive     * STAPHYLOCOCCUS EPIDERMIDIS  Blood Culture ID Panel (Reflexed)     Status:  Abnormal   Collection Time: 10/04/20 12:50 PM  Result Value Ref Range Status   Enterococcus faecalis NOT DETECTED NOT DETECTED Final   Enterococcus Faecium NOT DETECTED NOT DETECTED Final   Listeria monocytogenes NOT DETECTED NOT DETECTED Final   Staphylococcus species DETECTED (A) NOT DETECTED Final    Comment: CRITICAL RESULT CALLED TO, READ BACK BY AND VERIFIED WITH: A. Grandville Silos  RN 16:20 10/05/20 ([email or web address])    Staphylococcus aureus (BCID) NOT DETECTED NOT DETECTED Final   Staphylococcus epidermidis DETECTED (A) NOT DETECTED Final    Comment: Methicillin (oxacillin) resistant coagulase negative staphylococcus. Possible blood culture contaminant (unless isolated from more than one blood culture draw or clinical case suggests pathogenicity). No antibiotic treatment is indicated for blood  culture contaminants. CRITICAL RESULT CALLED TO, READ BACK BY AND VERIFIED WITH: A. Grandville Silos RN 16:20 10/05/20 ([email or web address])    Staphylococcus lugdunensis NOT DETECTED NOT DETECTED Final   Streptococcus species NOT DETECTED NOT DETECTED Final   Streptococcus agalactiae NOT DETECTED NOT DETECTED Final   Streptococcus pneumoniae NOT DETECTED NOT DETECTED Final   Streptococcus pyogenes NOT DETECTED NOT DETECTED Final   A.calcoaceticus-baumannii NOT DETECTED NOT DETECTED Final   Bacteroides fragilis NOT DETECTED NOT DETECTED Final   Enterobacterales NOT DETECTED NOT DETECTED Final   Enterobacter cloacae complex NOT DETECTED NOT DETECTED Final   Escherichia coli NOT DETECTED NOT DETECTED Final   Klebsiella aerogenes NOT DETECTED NOT DETECTED Final   Klebsiella oxytoca NOT DETECTED NOT DETECTED Final   Klebsiella pneumoniae NOT DETECTED NOT DETECTED Final   Proteus species NOT DETECTED NOT DETECTED Final   Salmonella species NOT DETECTED NOT DETECTED Final   Serratia marcescens NOT DETECTED NOT DETECTED Final   Haemophilus influenzae NOT DETECTED NOT DETECTED Final   Neisseria meningitidis NOT DETECTED NOT  DETECTED Final   Pseudomonas aeruginosa NOT DETECTED NOT DETECTED Final   Stenotrophomonas maltophilia NOT DETECTED NOT DETECTED Final   Candida albicans NOT DETECTED NOT DETECTED Final   Candida auris NOT DETECTED NOT DETECTED Final   Candida glabrata NOT DETECTED NOT DETECTED Final   Candida krusei NOT DETECTED NOT DETECTED Final   Candida parapsilosis NOT DETECTED NOT DETECTED Final   Candida tropicalis NOT DETECTED NOT DETECTED Final   Cryptococcus neoformans/gattii NOT DETECTED NOT DETECTED Final   Methicillin resistance mecA/C DETECTED (A) NOT DETECTED Final    Comment: CRITICAL RESULT CALLED TO, READ BACK BY AND VERIFIED WITH: A. Grandville Silos RN 16:20 10/05/20 ([email or web address]) Performed at [facility], [place] [address] [place], [place] [address]   Culture, blood (Routine X 2) w Reflex to ID Panel     Status: Abnormal   Collection Time: 10/04/20 12:51 PM   Specimen: BLOOD RIGHT HAND  Result Value Ref Range Status   Specimen Description BLOOD RIGHT HAND  Final   Special Requests   Final    BOTTLES DRAWN AEROBIC AND ANAEROBIC Blood Culture adequate volume   Culture  Setup Time   Final    GRAM POSITIVE COCCI AEROBIC BOTTLE ONLY CRITICAL VALUE NOTED.  VALUE IS CONSISTENT WITH PREVIOUSLY REPORTED AND CALLED VALUE.    Culture (A)  Final    STAPHYLOCOCCUS EPIDERMIDIS SUSCEPTIBILITIES PERFORMED ON PREVIOUS CULTURE WITHIN THE LAST 5 DAYS. Performed at [facility], [place] [address], [place], [place] [address]    Report Status 10/07/2020 FINAL  Final  C Difficile Quick Screen (NO PCR Reflex)     Status: None   Collection Time: 10/04/20  5:19 PM   Specimen: STOOL  Result Value Ref Range Status   C Diff antigen NEGATIVE NEGATIVE Final   C Diff toxin NEGATIVE NEGATIVE Final   C Diff interpretation No C. difficile detected.  Final    Comment: Performed at [facility], [place] [address], [place], [place] [address]  Culture, respiratory (non-expectorated)     Status: None    Collection Time: 10/05/20 10:14 PM  Specimen: Tracheal Aspirate; Respiratory  Result Value Ref Range Status   Specimen Description TRACHEAL ASPIRATE  Final   Special Requests NONE  Final   Gram Stain   Final    MODERATE WBC PRESENT, PREDOMINANTLY PMN MODERATE GRAM NEGATIVE RODS FEW YEAST RARE GRAM POSITIVE COCCI IN CHAINS Performed at [facility], [address] [place], [place] [address]    Culture MODERATE KLEBSIELLA PNEUMONIAE  Final   Report Status 10/08/2020 FINAL  Final   Organism ID, Bacteria KLEBSIELLA PNEUMONIAE  Final      Susceptibility   Klebsiella pneumoniae - MIC*    AMPICILLIN RESISTANT Resistant     CEFAZOLIN <=4 SENSITIVE Sensitive     CEFEPIME <=0.12 SENSITIVE Sensitive     CEFTAZIDIME <=1 SENSITIVE Sensitive     CEFTRIAXONE <=0.25 SENSITIVE Sensitive     CIPROFLOXACIN >=4 RESISTANT Resistant     GENTAMICIN <=1 SENSITIVE Sensitive     IMIPENEM <=0.25 SENSITIVE Sensitive     TRIMETH/SULFA <=20 SENSITIVE Sensitive     AMPICILLIN/SULBACTAM 8 SENSITIVE Sensitive     PIP/TAZO 16 SENSITIVE Sensitive     * MODERATE KLEBSIELLA PNEUMONIAE  Culture, blood (routine x 2)     Status: None (Preliminary result)   Collection Time: 10/08/20  4:17 PM   Specimen: BLOOD RIGHT WRIST  Result Value Ref Range Status   Specimen Description BLOOD RIGHT WRIST  Final   Special Requests   Final    BOTTLES DRAWN AEROBIC ONLY Blood Culture results may not be optimal due to an excessive volume of blood received in culture bottles   Culture   Final    NO GROWTH 4 DAYS Performed at [facility], [address] [place], [place] [address]    Report Status PENDING  Incomplete  Culture, blood (routine x 2)     Status: None (Preliminary result)   Collection Time: 10/08/20  4:17 PM   Specimen: BLOOD RIGHT HAND  Result Value Ref Range Status   Specimen Description BLOOD RIGHT HAND  Final   Special Requests   Final    BOTTLES DRAWN AEROBIC ONLY Blood Culture results may not be  optimal due to an excessive volume of blood received in culture bottles   Culture   Final    NO GROWTH 4 DAYS Performed at [facility], [place] [address], [place], [place] [address]    Report Status PENDING  Incomplete    Coagulation Studies: No results for input(s): LABPROT, INR in the last 72 hours.  Urinalysis: No results for input(s): COLORURINE, LABSPEC, PHURINE, GLUCOSEU, HGBUR, BILIRUBINUR, KETONESUR, PROTEINUR, UROBILINOGEN, NITRITE, LEUKOCYTESUR in the last 72 hours.  Invalid input(s): APPERANCEUR    Imaging: US Abdomen Limited  Result Date: 10/12/2020 CLINICAL DATA:  History of ascites with recent paracentesis EXAM: LIMITED ABDOMEN ULTRASOUND FOR ASCITES TECHNIQUE: Limited ultrasound survey for ascites was performed in all four abdominal quadrants. COMPARISON:  10/05/2020 FINDINGS: Ascites is noted with diffuse loculations identified throughout the abdomen. These changes have increased in the interval from 10/06/2020. IMPRESSION: Complex loculated ascites. The degree of loculation has increased significantly in the interval from the prior paracentesis on 10/06/2020. Electronically Signed   By: Inez Catalina M.D.   On: 10/12/2020 19:19     Medications:     iohexol, lidocaine  Assessment/ Plan:  54 y.o. male  with a PMHx of ESRD on HD, C. difficile colitis with subsequent colonic resection and colostomy placement, severe protein calorie malnutrition, diabetes mellitus type 2, anemia of  chronic kidney disease, combined systolic and diastolic heart failure, hyperlipidemia, fatty liver disease, sacral decubitus ulcer, anemia of chronic kidney disease, secondary hyperparathyroidism, right above-the-knee amputation, who was admitted to [facility] on 09/22/2020 for ongoing care.   1.  ESRD on HD.  Patient undergoing hemodialysis currently.  Tolerating well.  Ultrafiltration target net 1.5 kg today.  2.  Hypotension.  Maintain the patient on midodrine and albumin.  Blood  pressure earlier this a.m. was 141/83.  3.  Anemia of chronic kidney disease.   Lab Results  Component Value Date   HGB 8.3 (L) 10/13/2020  Hemoglobin up slightly to 8.3.  We will maintain the patient on Retacrit 10,000 IV with dialysis treatments.  4.  Secondary hyperparathyroidism.  Phosphorus noted to be 5.8.  May need to consider adjusting TPN.  5.  Acute respiratory failure.  Continue current vent support.    LOS: 0 Maretta Overdorf 10/22/20217:51 AM

## 2020-10-13 NOTE — Progress Notes (Addendum)
Pulmonary Critical Care Medicine [place]   PULMONARY CRITICAL CARE SERVICE  PROGRESS NOTE  Date of Service: 10/13/2020  Jeremy Yu  LSL:[id number]  DOB: 1966-02-28   DOA: 09/22/2020  Referring Physician: Merton Border, MD  HPI: Jeremy Yu is a 54 y.o. male seen for follow up of Acute on Chronic Respiratory Failure.  Patient mains on T-bar 35% FiO2 satting well no distress.  Medications: Reviewed on Rounds  Physical Exam:  Vitals: Pulse 103 respirations 22 BP 141/83 O2 sat 97% temp 98.2  Ventilator Settings not currently on ventilator  . General: Comfortable at this time . Eyes: Grossly normal lids, irises & conjunctiva . ENT: grossly tongue is normal . Neck: no obvious mass . Cardiovascular: S1 S2 normal no gallop . Respiratory: No rales or rhonchi noted . Abdomen: soft . Skin: no rash seen on limited exam . Musculoskeletal: not rigid . Psychiatric:unable to assess . Neurologic: no seizure no involuntary movements         Lab Data:   Basic Metabolic Panel: Recent Labs  Lab 10/09/20 0717 10/09/20 1436 10/11/20 0521 10/13/20 0609  NA 134*  --  130* 128*  K 3.1* 3.2* 3.5 4.2  CL 103  --  97* 96*  CO2 21*  --  23 23  GLUCOSE 234*  --  219* 218*  BUN 52*  --  52* 43*  CREATININE 3.49*  --  2.99* 2.88*  CALCIUM 9.2  --  9.1 9.2  MG 2.0  --  1.7 2.1  PHOS 4.2  --  4.4 5.8*    ABG: Recent Labs  Lab 10/12/20 1656  PHART 7.372  PCO2ART 44.5  PO2ART 80.9*  HCO3 25.3  O2SAT 96.1    Liver Function Tests: Recent Labs  Lab 10/09/20 0717 10/11/20 0521 10/13/20 0609  ALBUMIN 1.6* 1.6* 1.8*   No results for input(s): LIPASE, AMYLASE in the last 168 hours. No results for input(s): AMMONIA in the last 168 hours.  CBC: Recent Labs  Lab 10/07/20 0525 10/09/20 0717 10/09/20 2130 10/11/20 0521 10/13/20 0609  WBC 20.2* 22.1* 19.6* 16.7* 19.2*  HGB 8.4* 7.8* 8.4* 8.0* 8.3*  HCT 26.8* 25.5* 26.2* 25.4* 27.3*  MCV 90.5  92.4 92.9 91.7 92.5  PLT 484* 504* 513* 500* 660*    Cardiac Enzymes: No results for input(s): CKTOTAL, CKMB, CKMBINDEX, TROPONINI in the last 168 hours.  BNP (last 3 results) No results for input(s): BNP in the last 8760 hours.  ProBNP (last 3 results) No results for input(s): PROBNP in the last 8760 hours.  Radiological Exams: US Abdomen Limited  Result Date: 10/12/2020 CLINICAL DATA:  History of ascites with recent paracentesis EXAM: LIMITED ABDOMEN ULTRASOUND FOR ASCITES TECHNIQUE: Limited ultrasound survey for ascites was performed in all four abdominal quadrants. COMPARISON:  10/05/2020 FINDINGS: Ascites is noted with diffuse loculations identified throughout the abdomen. These changes have increased in the interval from 10/06/2020. IMPRESSION: Complex loculated ascites. The degree of loculation has increased significantly in the interval from the prior paracentesis on 10/06/2020. Electronically Signed   By: Inez Catalina M.D.   On: 10/12/2020 19:19    Assessment/Plan Active Problems:   Acute on chronic respiratory failure with hypoxia (HCC)   End stage renal failure on dialysis (HCC)   Severe sepsis with septic shock (CODE) (HCC)   Cardiac arrest (HCC)   Metabolic encephalopathy   1. Acute on chronic respiratory failure hypoxia patient will continue on T-bar 35% at this time continue supportive measures and aggressive pulmonary  toilet. 2. End-stage renal failure on hemodialysis being followed by nephrology 3. Severe sepsis with shock resolved 4. Cardiac arrest rhythm has been stable 5. Metabolic encephalopathy slow improvement   I have personally seen and evaluated the patient, evaluated laboratory and imaging results, formulated the assessment and plan and placed orders. The Patient requires high complexity decision making with multiple systems involvement.  Rounds were done with the Respiratory Therapy Director and Staff therapists and discussed with nursing staff  also.  Allyne Gee, MD [facility] Pulmonary Critical Care Medicine Sleep Medicine

## 2020-10-13 NOTE — Progress Notes (Signed)
Request to IR for possible therapeutic paracentesis based on abdominal US findings yesterday (10/21).  Review if [place] images shows highly loculated ascites with significantly more areas of loculations than previously seen on 10/15. There does not appear to be any significant pocket of fluid which would provide any therapeutic benefit from the patient - if labs were desired it may be possible to obtain ~50 mL of peritoneal fluid for testing purposes.   Discussed the above with Priya, NP who states understanding, no labs indicated at this current time. No procedure planned, order will be deleted.  Please call IR with questions or concerns.  Candiss Norse, PA-C

## 2020-10-14 DIAGNOSIS — N186 End stage renal disease: Secondary | ICD-10-CM | POA: Diagnosis not present

## 2020-10-14 DIAGNOSIS — J9621 Acute and chronic respiratory failure with hypoxia: Secondary | ICD-10-CM | POA: Diagnosis not present

## 2020-10-14 DIAGNOSIS — G9341 Metabolic encephalopathy: Secondary | ICD-10-CM | POA: Diagnosis not present

## 2020-10-14 DIAGNOSIS — I469 Cardiac arrest, cause unspecified: Secondary | ICD-10-CM | POA: Diagnosis not present

## 2020-10-14 NOTE — Progress Notes (Signed)
Pulmonary Critical Care Medicine [place]   PULMONARY CRITICAL CARE SERVICE  PROGRESS NOTE  Date of Service: 10/14/2020  Jeremy Yu  KVQ:[id number]  DOB: 1966/05/10   DOA: 09/22/2020  Referring Physician: Merton Border, MD  HPI: Jeremy Yu is a 54 y.o. male seen for follow up of Acute on Chronic Respiratory Failure.  Patient went down yesterday for attempted paracentesis not enough fluid to be tapped.  Right now is on T collar and 35% FiO2 will try to use the PMV today  Medications: Reviewed on Rounds  Physical Exam:  Vitals: Temperature is 99.8 pulse 106 respiratory 18 blood pressure is 130/60 saturations 100%  Ventilator Settings on T collar FiO2 35%  . General: Comfortable at this time . Eyes: Grossly normal lids, irises & conjunctiva . ENT: grossly tongue is normal . Neck: no obvious mass . Cardiovascular: S1 S2 normal no gallop . Respiratory: No rhonchi no rales are noted at this time . Abdomen: soft . Skin: no rash seen on limited exam . Musculoskeletal: not rigid . Psychiatric:unable to assess . Neurologic: no seizure no involuntary movements         Lab Data:   Basic Metabolic Panel: Recent Labs  Lab 10/09/20 0717 10/09/20 1436 10/11/20 0521 10/13/20 0609  NA 134*  --  130* 128*  K 3.1* 3.2* 3.5 4.2  CL 103  --  97* 96*  CO2 21*  --  23 23  GLUCOSE 234*  --  219* 218*  BUN 52*  --  52* 43*  CREATININE 3.49*  --  2.99* 2.88*  CALCIUM 9.2  --  9.1 9.2  MG 2.0  --  1.7 2.1  PHOS 4.2  --  4.4 5.8*    ABG: Recent Labs  Lab 10/12/20 1656  PHART 7.372  PCO2ART 44.5  PO2ART 80.9*  HCO3 25.3  O2SAT 96.1    Liver Function Tests: Recent Labs  Lab 10/09/20 0717 10/11/20 0521 10/13/20 0609  ALBUMIN 1.6* 1.6* 1.8*   No results for input(s): LIPASE, AMYLASE in the last 168 hours. No results for input(s): AMMONIA in the last 168 hours.  CBC: Recent Labs  Lab 10/09/20 0717 10/09/20 2130 10/11/20 0521  10/13/20 0609  WBC 22.1* 19.6* 16.7* 19.2*  HGB 7.8* 8.4* 8.0* 8.3*  HCT 25.5* 26.2* 25.4* 27.3*  MCV 92.4 92.9 91.7 92.5  PLT 504* 513* 500* 660*    Cardiac Enzymes: No results for input(s): CKTOTAL, CKMB, CKMBINDEX, TROPONINI in the last 168 hours.  BNP (last 3 results) No results for input(s): BNP in the last 8760 hours.  ProBNP (last 3 results) No results for input(s): PROBNP in the last 8760 hours.  Radiological Exams: US Abdomen Limited  Result Date: 10/12/2020 CLINICAL DATA:  History of ascites with recent paracentesis EXAM: LIMITED ABDOMEN ULTRASOUND FOR ASCITES TECHNIQUE: Limited ultrasound survey for ascites was performed in all four abdominal quadrants. COMPARISON:  10/05/2020 FINDINGS: Ascites is noted with diffuse loculations identified throughout the abdomen. These changes have increased in the interval from 10/06/2020. IMPRESSION: Complex loculated ascites. The degree of loculation has increased significantly in the interval from the prior paracentesis on 10/06/2020. Electronically Signed   By: Inez Catalina M.D.   On: 10/12/2020 19:19    Assessment/Plan Active Problems:   Acute on chronic respiratory failure with hypoxia (HCC)   End stage renal failure on dialysis (HCC)   Severe sepsis with septic shock (CODE) (HCC)   Cardiac arrest (HCC)   Metabolic encephalopathy   1. Acute  on chronic respiratory failure with hypoxia we will continue with T collar right now is on 35% FiO2 will try the PMV. 2. End-stage renal failure on hemodialysis being followed by nephrology. 3. Severe sepsis with shock treated with antibiotics.  Seen by infectious disease 4. Cardiac arrest rhythm is stable 5. Metabolic encephalopathy supportive care   I have personally seen and evaluated the patient, evaluated laboratory and imaging results, formulated the assessment and plan and placed orders. The Patient requires high complexity decision making with multiple systems involvement.   Rounds were done with the Respiratory Therapy Director and Staff therapists and discussed with nursing staff also.  Allyne Gee, MD [facility] Pulmonary Critical Care Medicine Sleep Medicine

## 2020-10-15 DIAGNOSIS — N186 End stage renal disease: Secondary | ICD-10-CM | POA: Diagnosis not present

## 2020-10-15 DIAGNOSIS — J9621 Acute and chronic respiratory failure with hypoxia: Secondary | ICD-10-CM | POA: Diagnosis not present

## 2020-10-15 DIAGNOSIS — G9341 Metabolic encephalopathy: Secondary | ICD-10-CM | POA: Diagnosis not present

## 2020-10-15 DIAGNOSIS — I469 Cardiac arrest, cause unspecified: Secondary | ICD-10-CM | POA: Diagnosis not present

## 2020-10-15 NOTE — Progress Notes (Signed)
Pulmonary Critical Care Medicine [place]   PULMONARY CRITICAL CARE SERVICE  PROGRESS NOTE  Date of Service: 10/15/2020  Jeremy Yu  FTD:[id number]  DOB: [birth year]   DOA: 09/22/2020  Referring Physician: Merton Border, MD  HPI: Jeremy Yu is a 54 y.o. male seen for follow up of Acute on Chronic Respiratory Failure. Patient currently is on T collar has been on 35% FiO2 with good saturations ready for trach change  Medications: Reviewed on Rounds  Physical Exam:  Vitals: Temperature is 96.5 pulse 83 respiratory rate 20 blood pressure is 119/86 saturations 100%  Ventilator Settings on T collar with an FiO2 of 35%  . General: Comfortable at this time . Eyes: Grossly normal lids, irises & conjunctiva . ENT: grossly tongue is normal . Neck: no obvious mass . Cardiovascular: S1 S2 normal no gallop . Respiratory: Scattered rhonchi expansion is equal . Abdomen: soft . Skin: no rash seen on limited exam . Musculoskeletal: not rigid . Psychiatric:unable to assess . Neurologic: no seizure no involuntary movements         Lab Data:   Basic Metabolic Panel: Recent Labs  Lab 10/09/20 0717 10/09/20 1436 10/11/20 0521 10/13/20 0609  NA 134*  --  130* 128*  K 3.1* 3.2* 3.5 4.2  CL 103  --  97* 96*  CO2 21*  --  23 23  GLUCOSE 234*  --  219* 218*  BUN 52*  --  52* 43*  CREATININE 3.49*  --  2.99* 2.88*  CALCIUM 9.2  --  9.1 9.2  MG 2.0  --  1.7 2.1  PHOS 4.2  --  4.4 5.8*    ABG: Recent Labs  Lab 10/12/20 1656  PHART 7.372  PCO2ART 44.5  PO2ART 80.9*  HCO3 25.3  O2SAT 96.1    Liver Function Tests: Recent Labs  Lab 10/09/20 0717 10/11/20 0521 10/13/20 0609  ALBUMIN 1.6* 1.6* 1.8*   No results for input(s): LIPASE, AMYLASE in the last 168 hours. No results for input(s): AMMONIA in the last 168 hours.  CBC: Recent Labs  Lab 10/09/20 0717 10/09/20 2130 10/11/20 0521 10/13/20 0609  WBC 22.1* 19.6* 16.7* 19.2*  HGB 7.8*  8.4* 8.0* 8.3*  HCT 25.5* 26.2* 25.4* 27.3*  MCV 92.4 92.9 91.7 92.5  PLT 504* 513* 500* 660*    Cardiac Enzymes: No results for input(s): CKTOTAL, CKMB, CKMBINDEX, TROPONINI in the last 168 hours.  BNP (last 3 results) No results for input(s): BNP in the last 8760 hours.  ProBNP (last 3 results) No results for input(s): PROBNP in the last 8760 hours.  Radiological Exams: No results found.  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 Acute on chronic respiratory failure hypoxia we will continue with the wean downsize trach to a #6 cuffless 2. End-stage renal failure on hemodialysis we will continue supportive care 3. Severe sepsis with shock resolved hemodynamics stable 4. Cardiac arrest rhythm stable 5. Metabolic encephalopathy grossly unchanged we will continue with present management   I have personally seen and evaluated the patient, evaluated laboratory and imaging results, formulated the assessment and plan and placed orders. The Patient requires high complexity decision making with multiple systems involvement.  Rounds were done with the Respiratory Therapy Director and Staff therapists and discussed with nursing staff also.  Allyne Gee, MD [facility] Pulmonary Critical Care Medicine Sleep Medicine

## 2020-10-16 DIAGNOSIS — I469 Cardiac arrest, cause unspecified: Secondary | ICD-10-CM | POA: Diagnosis not present

## 2020-10-16 DIAGNOSIS — N186 End stage renal disease: Secondary | ICD-10-CM | POA: Diagnosis not present

## 2020-10-16 DIAGNOSIS — J9621 Acute and chronic respiratory failure with hypoxia: Secondary | ICD-10-CM | POA: Diagnosis not present

## 2020-10-16 DIAGNOSIS — G9341 Metabolic encephalopathy: Secondary | ICD-10-CM | POA: Diagnosis not present

## 2020-10-16 LAB — CBC
HCT: 24 % — ABNORMAL LOW (ref 39.0–52.0)
Hemoglobin: 7.2 g/dL — ABNORMAL LOW (ref 13.0–17.0)
MCH: 29 pg (ref 26.0–34.0)
MCHC: 30 g/dL (ref 30.0–36.0)
MCV: 96.8 fL (ref 80.0–100.0)
Platelets: 739 10*3/uL — ABNORMAL HIGH (ref 150–400)
RBC: 2.48 MIL/uL — ABNORMAL LOW (ref 4.22–5.81)
RDW: 21.6 % — ABNORMAL HIGH (ref 11.5–15.5)
WBC: 20.8 10*3/uL — ABNORMAL HIGH (ref 4.0–10.5)
nRBC: 0.2 % (ref 0.0–0.2)

## 2020-10-16 LAB — RENAL FUNCTION PANEL
Albumin: 1.5 g/dL — ABNORMAL LOW (ref 3.5–5.0)
Anion gap: 9 (ref 5–15)
BUN: 70 mg/dL — ABNORMAL HIGH (ref 6–20)
CO2: 22 mmol/L (ref 22–32)
Calcium: 9.5 mg/dL (ref 8.9–10.3)
Chloride: 99 mmol/L (ref 98–111)
Creatinine, Ser: 3.75 mg/dL — ABNORMAL HIGH (ref 0.61–1.24)
GFR, Estimated: 18 mL/min — ABNORMAL LOW (ref >60)
Glucose, Bld: 86 mg/dL (ref 70–99)
Phosphorus: 4.8 mg/dL — ABNORMAL HIGH (ref 2.5–4.6)
Potassium: 4.4 mmol/L (ref 3.5–5.1)
Sodium: 130 mmol/L — ABNORMAL LOW (ref 135–145)

## 2020-10-16 LAB — MAGNESIUM: Magnesium: 2.4 mg/dL (ref 1.7–2.4)

## 2020-10-16 NOTE — Progress Notes (Signed)
Cbc and renal panel collected via this nurse and sent to lab

## 2020-10-16 NOTE — Progress Notes (Signed)
Pulmonary Critical Care Medicine [place]   PULMONARY CRITICAL CARE SERVICE  PROGRESS NOTE  Date of Service: 10/16/2020  Jeremy Yu  IWP:[id number]  DOB: 10/22/1966   DOA: 09/22/2020  Referring Physician: Merton Border, MD  HPI: Jeremy Yu is a 54 y.o. male seen for follow up of Acute on Chronic Respiratory Failure.  Patient currently is on 20% FiO2 has been using the T collar  Medications: Reviewed on Rounds  Physical Exam:  Vitals: Temperature is 97.6 pulse 82 respiratory rate 24 blood pressure is 128/75 saturations 100%  Ventilator Settings on T collar FiO2 28%  . General: Comfortable at this time . Eyes: Grossly normal lids, irises & conjunctiva . ENT: grossly tongue is normal . Neck: no obvious mass . Cardiovascular: S1 S2 normal no gallop . Respiratory: Scattered rhonchi expansion is equal . Abdomen: soft . Skin: no rash seen on limited exam . Musculoskeletal: not rigid . Psychiatric:unable to assess . Neurologic: no seizure no involuntary movements         Lab Data:   Basic Metabolic Panel: Recent Labs  Lab 10/09/20 1436 10/11/20 0521 10/13/20 0609 10/16/20 0500  NA  --  130* 128* 130*  K 3.2* 3.5 4.2 4.4  CL  --  97* 96* 99  CO2  --  $R'23 23 22  'YK$ GLUCOSE  --  219* 218* 86  BUN  --  52* 43* 70*  CREATININE  --  2.99* 2.88* 3.75*  CALCIUM  --  9.1 9.2 9.5  MG  --  1.7 2.1 2.4  PHOS  --  4.4 5.8* 4.8*    ABG: Recent Labs  Lab 10/12/20 1656  PHART 7.372  PCO2ART 44.5  PO2ART 80.9*  HCO3 25.3  O2SAT 96.1    Liver Function Tests: Recent Labs  Lab 10/11/20 0521 10/13/20 0609 10/16/20 0500  ALBUMIN 1.6* 1.8* 1.5*   No results for input(s): LIPASE, AMYLASE in the last 168 hours. No results for input(s): AMMONIA in the last 168 hours.  CBC: Recent Labs  Lab 10/09/20 2130 10/11/20 0521 10/13/20 0609 10/16/20 0500  WBC 19.6* 16.7* 19.2* 20.8*  HGB 8.4* 8.0* 8.3* 7.2*  HCT 26.2* 25.4* 27.3* 24.0*  MCV  92.9 91.7 92.5 96.8  PLT 513* 500* 660* 739*    Cardiac Enzymes: No results for input(s): CKTOTAL, CKMB, CKMBINDEX, TROPONINI in the last 168 hours.  BNP (last 3 results) No results for input(s): BNP in the last 8760 hours.  ProBNP (last 3 results) No results for input(s): PROBNP in the last 8760 hours.  Radiological Exams: No results found.  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 Acute on chronic respiratory failure with hypoxia we will continue with T collar trials currently is on 28% FiO2 good saturations are noted. 2. End-stage renal failure on hemodialysis we will continue with supportive care follow-up with nephrology recommendations 3. Severe sepsis with shock right now hemodynamically stable 4. Cardiac arrest rhythm was being monitored has been stable 5. Metabolic encephalopathy no change   I have personally seen and evaluated the patient, evaluated laboratory and imaging results, formulated the assessment and plan and placed orders. The Patient requires high complexity decision making with multiple systems involvement.  Rounds were done with the Respiratory Therapy Director and Staff therapists and discussed with nursing  staff also.  Allyne Gee, MD [facility] Pulmonary Critical Care Medicine Sleep Medicine

## 2020-10-16 NOTE — Progress Notes (Signed)
[facility]  ROUNDING NOTE   Subjective:  Patient has been transitioned off of the ventilator. He is currently seen and evaluated during dialysis. Tolerating well at the moment. Appears to be breathing comfortably.  Objective:  Vital signs in last 24 hours:  Temperature 91.6 pulse 82 respirations 24 blood pressure 128/75   Physical Exam: General:  Chronically ill-appearing  Head:  Normocephalic, atraumatic. Moist oral mucosal membranes  Eyes:  Anicteric  Neck:  Tracheostomy in place  Lungs:   Coarse breath sounds bilateral, normal effort  Heart:  S1S2 no rubs  Abdomen:   Soft, nontender, bowel sounds present, PEG in place, colostomy  Extremities:  Right AKA  Neurologic:  Awake, alert, follows commands  Skin:  No lesions  Access:  Left upper extremity AV fistula    Basic Metabolic Panel: Recent Labs  Lab 10/09/20 1436 10/11/20 0521 10/13/20 0609 10/16/20 0500  NA  --  130* 128* 130*  K 3.2* 3.5 4.2 4.4  CL  --  97* 96* 99  CO2  --  $R'23 23 22  'vo$ GLUCOSE  --  219* 218* 86  BUN  --  52* 43* 70*  CREATININE  --  2.99* 2.88* 3.75*  CALCIUM  --  9.1 9.2 9.5  MG  --  1.7 2.1 2.4  PHOS  --  4.4 5.8* 4.8*    Liver Function Tests: Recent Labs  Lab 10/11/20 0521 10/13/20 0609 10/16/20 0500  ALBUMIN 1.6* 1.8* 1.5*   No results for input(s): LIPASE, AMYLASE in the last 168 hours. No results for input(s): AMMONIA in the last 168 hours.  CBC: Recent Labs  Lab 10/09/20 2130 10/11/20 0521 10/13/20 0609 10/16/20 0500  WBC 19.6* 16.7* 19.2* 20.8*  HGB 8.4* 8.0* 8.3* 7.2*  HCT 26.2* 25.4* 27.3* 24.0*  MCV 92.9 91.7 92.5 96.8  PLT 513* 500* 660* 739*    Cardiac Enzymes: No results for input(s): CKTOTAL, CKMB, CKMBINDEX, TROPONINI in the last 168 hours.  BNP: Invalid input(s): POCBNP  CBG: No results for input(s): GLUCAP in the last 168 hours.  Microbiology: Results for orders placed or performed during the hospital encounter of 09/22/20   Culture, respiratory (non-expectorated)     Status: None   Collection Time: 09/23/20 11:25 AM   Specimen: Tracheal Aspirate; Respiratory  Result Value Ref Range Status   Specimen Description TRACHEAL ASPIRATE  Final   Special Requests NONE  Final   Gram Stain   Final    RARE WBC PRESENT,BOTH PMN AND MONONUCLEAR NO ORGANISMS SEEN Performed at [facility], [address] [place], [place] [address]    Culture FEW KLEBSIELLA PNEUMONIAE  Final   Report Status 09/25/2020 FINAL  Final   Organism ID, Bacteria KLEBSIELLA PNEUMONIAE  Final      Susceptibility   Klebsiella pneumoniae - MIC*    AMPICILLIN >=32 RESISTANT Resistant     CEFAZOLIN <=4 SENSITIVE Sensitive     CEFEPIME 0.25 SENSITIVE Sensitive     CEFTAZIDIME <=1 SENSITIVE Sensitive     CEFTRIAXONE 1 SENSITIVE Sensitive     CIPROFLOXACIN <=0.25 SENSITIVE Sensitive     GENTAMICIN <=1 SENSITIVE Sensitive     IMIPENEM <=0.25 SENSITIVE Sensitive     TRIMETH/SULFA <=20 SENSITIVE Sensitive     AMPICILLIN/SULBACTAM 16 INTERMEDIATE Intermediate     PIP/TAZO 64 INTERMEDIATE Intermediate     * FEW KLEBSIELLA PNEUMONIAE  Culture, blood (routine x 2)     Status: None   Collection Time: 09/23/20  4:26 PM  Specimen: BLOOD  Result Value Ref Range Status   Specimen Description BLOOD BLOOD RIGHT HAND  Final   Special Requests   Final    AEROBIC BOTTLE ONLY Blood Culture results may not be optimal due to an inadequate volume of blood received in culture bottles   Culture   Final    NO GROWTH 5 DAYS Performed at [facility], [place] [address], [place], [place] [address]    Report Status 09/28/2020 FINAL  Final  Culture, blood (single)     Status: None   Collection Time: 09/24/20  6:00 AM   Specimen: BLOOD RIGHT HAND  Result Value Ref Range Status   Specimen Description BLOOD RIGHT HAND  Final   Special Requests   Final    BOTTLES DRAWN AEROBIC ONLY Blood Culture results may not be optimal due to an inadequate volume of blood  received in culture bottles   Culture   Final    NO GROWTH 5 DAYS Performed at [facility], [place] [address], [place], [place] [address]    Report Status 09/29/2020 FINAL  Final  Body fluid culture     Status: None   Collection Time: 09/29/20 12:27 PM   Specimen: PATH Cytology Peritoneal fluid  Result Value Ref Range Status   Specimen Description PERITONEAL FLUID  Final   Special Requests NONE  Final   Gram Stain   Final    WBC PRESENT,BOTH PMN AND MONONUCLEAR NO ORGANISMS SEEN CYTOSPIN SMEAR    Culture   Final    NO GROWTH 3 DAYS Performed at [facility], [address], [place], [place] [address]    Report Status 10/03/2020 FINAL  Final  Culture, blood (Routine X 2) w Reflex to ID Panel     Status: Abnormal   Collection Time: 10/04/20 12:50 PM   Specimen: BLOOD  Result Value Ref Range Status   Specimen Description BLOOD RIGHT ANTECUBITAL  Final   Special Requests   Final    BOTTLES DRAWN AEROBIC AND ANAEROBIC Blood Culture adequate volume   Culture  Setup Time   Final    GRAM POSITIVE COCCI IN CLUSTERS AEROBIC BOTTLE ONLY CRITICAL RESULT CALLED TO, READ BACK BY AND VERIFIED WITH: A. Grandville Silos RN 16:20 10/05/20 ([email or web address]) Performed at [facility], [place] [address] [place], [place] [address]    Culture STAPHYLOCOCCUS EPIDERMIDIS (A)  Final   Report Status 10/07/2020 FINAL  Final   Organism ID, Bacteria STAPHYLOCOCCUS EPIDERMIDIS  Final      Susceptibility   Staphylococcus epidermidis - MIC*    CIPROFLOXACIN >=8 RESISTANT Resistant     ERYTHROMYCIN >=8 RESISTANT Resistant     GENTAMICIN >=16 RESISTANT Resistant     OXACILLIN >=4 RESISTANT Resistant     TETRACYCLINE 2 SENSITIVE Sensitive     VANCOMYCIN 2 SENSITIVE Sensitive     TRIMETH/SULFA 160 RESISTANT Resistant     CLINDAMYCIN >=8 RESISTANT Resistant     RIFAMPIN <=0.5 SENSITIVE Sensitive     Inducible Clindamycin NEGATIVE Sensitive     * STAPHYLOCOCCUS EPIDERMIDIS  Blood Culture ID Panel  (Reflexed)     Status: Abnormal   Collection Time: 10/04/20 12:50 PM  Result Value Ref Range Status   Enterococcus faecalis NOT DETECTED NOT DETECTED Final   Enterococcus Faecium NOT DETECTED NOT DETECTED Final   Listeria monocytogenes NOT DETECTED NOT DETECTED Final   Staphylococcus species DETECTED (A) NOT DETECTED Final    Comment: CRITICAL RESULT CALLED TO, READ BACK BY  AND VERIFIED WITH: A. Grandville Silos RN 16:20 10/05/20 ([email or web address])    Staphylococcus aureus (BCID) NOT DETECTED NOT DETECTED Final   Staphylococcus epidermidis DETECTED (A) NOT DETECTED Final    Comment: Methicillin (oxacillin) resistant coagulase negative staphylococcus. Possible blood culture contaminant (unless isolated from more than one blood culture draw or clinical case suggests pathogenicity). No antibiotic treatment is indicated for blood  culture contaminants. CRITICAL RESULT CALLED TO, READ BACK BY AND VERIFIED WITH: A. Grandville Silos RN 16:20 10/05/20 ([email or web address])    Staphylococcus lugdunensis NOT DETECTED NOT DETECTED Final   Streptococcus species NOT DETECTED NOT DETECTED Final   Streptococcus agalactiae NOT DETECTED NOT DETECTED Final   Streptococcus pneumoniae NOT DETECTED NOT DETECTED Final   Streptococcus pyogenes NOT DETECTED NOT DETECTED Final   A.calcoaceticus-baumannii NOT DETECTED NOT DETECTED Final   Bacteroides fragilis NOT DETECTED NOT DETECTED Final   Enterobacterales NOT DETECTED NOT DETECTED Final   Enterobacter cloacae complex NOT DETECTED NOT DETECTED Final   Escherichia coli NOT DETECTED NOT DETECTED Final   Klebsiella aerogenes NOT DETECTED NOT DETECTED Final   Klebsiella oxytoca NOT DETECTED NOT DETECTED Final   Klebsiella pneumoniae NOT DETECTED NOT DETECTED Final   Proteus species NOT DETECTED NOT DETECTED Final   Salmonella species NOT DETECTED NOT DETECTED Final   Serratia marcescens NOT DETECTED NOT DETECTED Final   Haemophilus influenzae NOT DETECTED NOT DETECTED Final   Neisseria  meningitidis NOT DETECTED NOT DETECTED Final   Pseudomonas aeruginosa NOT DETECTED NOT DETECTED Final   Stenotrophomonas maltophilia NOT DETECTED NOT DETECTED Final   Candida albicans NOT DETECTED NOT DETECTED Final   Candida auris NOT DETECTED NOT DETECTED Final   Candida glabrata NOT DETECTED NOT DETECTED Final   Candida krusei NOT DETECTED NOT DETECTED Final   Candida parapsilosis NOT DETECTED NOT DETECTED Final   Candida tropicalis NOT DETECTED NOT DETECTED Final   Cryptococcus neoformans/gattii NOT DETECTED NOT DETECTED Final   Methicillin resistance mecA/C DETECTED (A) NOT DETECTED Final    Comment: CRITICAL RESULT CALLED TO, READ BACK BY AND VERIFIED WITH: A. Grandville Silos RN 16:20 10/05/20 ([email or web address]) Performed at [facility], [place] [address] [place], [place] [address]   Culture, blood (Routine X 2) w Reflex to ID Panel     Status: Abnormal   Collection Time: 10/04/20 12:51 PM   Specimen: BLOOD RIGHT HAND  Result Value Ref Range Status   Specimen Description BLOOD RIGHT HAND  Final   Special Requests   Final    BOTTLES DRAWN AEROBIC AND ANAEROBIC Blood Culture adequate volume   Culture  Setup Time   Final    GRAM POSITIVE COCCI AEROBIC BOTTLE ONLY CRITICAL VALUE NOTED.  VALUE IS CONSISTENT WITH PREVIOUSLY REPORTED AND CALLED VALUE.    Culture (A)  Final    STAPHYLOCOCCUS EPIDERMIDIS SUSCEPTIBILITIES PERFORMED ON PREVIOUS CULTURE WITHIN THE LAST 5 DAYS. Performed at [facility], [place] [address], [place], [place] [address]    Report Status 10/07/2020 FINAL  Final  C Difficile Quick Screen (NO PCR Reflex)     Status: None   Collection Time: 10/04/20  5:19 PM   Specimen: STOOL  Result Value Ref Range Status   C Diff antigen NEGATIVE NEGATIVE Final   C Diff toxin NEGATIVE NEGATIVE Final   C Diff interpretation No C. difficile detected.  Final    Comment: Performed at [facility], [place] [address], [place], [place] [address]  Culture, respiratory  (non-expectorated)     Status: None   Collection  Time: 10/05/20 10:14 PM   Specimen: Tracheal Aspirate; Respiratory  Result Value Ref Range Status   Specimen Description TRACHEAL ASPIRATE  Final   Special Requests NONE  Final   Gram Stain   Final    MODERATE WBC PRESENT, PREDOMINANTLY PMN MODERATE GRAM NEGATIVE RODS FEW YEAST RARE GRAM POSITIVE COCCI IN CHAINS Performed at [facility], [address], [place], [place] [address]    Culture MODERATE KLEBSIELLA PNEUMONIAE  Final   Report Status 10/08/2020 FINAL  Final   Organism ID, Bacteria KLEBSIELLA PNEUMONIAE  Final      Susceptibility   Klebsiella pneumoniae - MIC*    AMPICILLIN RESISTANT Resistant     CEFAZOLIN <=4 SENSITIVE Sensitive     CEFEPIME <=0.12 SENSITIVE Sensitive     CEFTAZIDIME <=1 SENSITIVE Sensitive     CEFTRIAXONE <=0.25 SENSITIVE Sensitive     CIPROFLOXACIN >=4 RESISTANT Resistant     GENTAMICIN <=1 SENSITIVE Sensitive     IMIPENEM <=0.25 SENSITIVE Sensitive     TRIMETH/SULFA <=20 SENSITIVE Sensitive     AMPICILLIN/SULBACTAM 8 SENSITIVE Sensitive     PIP/TAZO 16 SENSITIVE Sensitive     * MODERATE KLEBSIELLA PNEUMONIAE  Culture, blood (routine x 2)     Status: None   Collection Time: 10/08/20  4:17 PM   Specimen: BLOOD RIGHT WRIST  Result Value Ref Range Status   Specimen Description BLOOD RIGHT WRIST  Final   Special Requests   Final    BOTTLES DRAWN AEROBIC ONLY Blood Culture results may not be optimal due to an excessive volume of blood received in culture bottles   Culture   Final    NO GROWTH 5 DAYS Performed at [facility], [place] [address], [place], [place] [address]    Report Status 10/13/2020 FINAL  Final  Culture, blood (routine x 2)     Status: None   Collection Time: 10/08/20  4:17 PM   Specimen: BLOOD RIGHT HAND  Result Value Ref Range Status   Specimen Description BLOOD RIGHT HAND  Final   Special Requests   Final    BOTTLES DRAWN AEROBIC ONLY Blood Culture results may not be  optimal due to an excessive volume of blood received in culture bottles   Culture   Final    NO GROWTH 5 DAYS Performed at [facility], [place] [address] [place], [place] [address]    Report Status 10/13/2020 FINAL  Final  Culture, respiratory (non-expectorated)     Status: None (Preliminary result)   Collection Time: 10/14/20  1:41 PM   Specimen: Tracheal Aspirate; Respiratory  Result Value Ref Range Status   Specimen Description TRACHEAL ASPIRATE  Final   Special Requests NONE  Final   Gram Stain   Final    RARE WBC PRESENT, PREDOMINANTLY PMN RARE GRAM NEGATIVE RODS Performed at [facility], [place] [address], [place], [place] [address]    Culture PENDING  Incomplete   Report Status PENDING  Incomplete    Coagulation Studies: No results for input(s): LABPROT, INR in the last 72 hours.  Urinalysis: No results for input(s): COLORURINE, LABSPEC, PHURINE, GLUCOSEU, HGBUR, BILIRUBINUR, KETONESUR, PROTEINUR, UROBILINOGEN, NITRITE, LEUKOCYTESUR in the last 72 hours.  Invalid input(s): APPERANCEUR    Imaging: No results found.   Medications:     iohexol, lidocaine  Assessment/ Plan:  54 y.o. 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   1.  ESRD on HD.  Patient seen during dialysis treatment.  Tolerating well.  Ultrafiltration target 1.5 kg.  2.  Hypotension.  Patient did receive albumin this AM.  Helps to maintain blood pressure during dialysis treatment.  3.  Anemia of chronic kidney disease.   Lab Results  Component Value Date    HGB 7.2 (L) 10/16/2020  Hemoglobin down to 7.2.  Continue to monitor closely.  Consider blood transfusion for hemoglobin of 7 or less.  Otherwise maintain the patient on Retacrit.  4.  Secondary hyperparathyroidism.  Phosphorus improved down to 4.8.  Continue to monitor.  5.  Acute respiratory failure.  Currently off of the ventilator.  Breathing comfortably.  Continue to monitor respiratory status.    LOS: 0 Tricia [place] 10/25/20219:30 AM

## 2020-10-17 DIAGNOSIS — I469 Cardiac arrest, cause unspecified: Secondary | ICD-10-CM | POA: Diagnosis not present

## 2020-10-17 DIAGNOSIS — N186 End stage renal disease: Secondary | ICD-10-CM | POA: Diagnosis not present

## 2020-10-17 DIAGNOSIS — G9341 Metabolic encephalopathy: Secondary | ICD-10-CM | POA: Diagnosis not present

## 2020-10-17 DIAGNOSIS — J9621 Acute and chronic respiratory failure with hypoxia: Secondary | ICD-10-CM | POA: Diagnosis not present

## 2020-10-17 LAB — CULTURE, RESPIRATORY W GRAM STAIN: Culture: NORMAL

## 2020-10-17 LAB — CBC
HCT: 24.6 % — ABNORMAL LOW (ref 39.0–52.0)
Hemoglobin: 7.2 g/dL — ABNORMAL LOW (ref 13.0–17.0)
MCH: 28.9 pg (ref 26.0–34.0)
MCHC: 29.3 g/dL — ABNORMAL LOW (ref 30.0–36.0)
MCV: 98.8 fL (ref 80.0–100.0)
Platelets: 678 10*3/uL — ABNORMAL HIGH (ref 150–400)
RBC: 2.49 MIL/uL — ABNORMAL LOW (ref 4.22–5.81)
RDW: 21.6 % — ABNORMAL HIGH (ref 11.5–15.5)
WBC: 19.1 10*3/uL — ABNORMAL HIGH (ref 4.0–10.5)
nRBC: 0.3 % — ABNORMAL HIGH (ref 0.0–0.2)

## 2020-10-17 NOTE — Progress Notes (Signed)
Pulmonary Critical Care Medicine [place]   PULMONARY CRITICAL CARE SERVICE  PROGRESS NOTE  Date of Service: 10/17/2020  Jeremy Yu  GXQ:[id number]  DOB: 01/01/1966   DOA: 09/22/2020  Referring Physician: Merton Border, MD  HPI: Jeremy Yu is a 54 y.o. male seen for follow up of Acute on Chronic Respiratory Failure.  Patient is capping right now requiring 1 L of oxygen good saturations are noted  Medications: Reviewed on Rounds  Physical Exam:  Vitals: Temperature is 99.1 pulse 95 respiratory rate 22 blood pressure is 124/68 saturations 100%  Ventilator Settings patient is capping currently off the ventilator  . General: Comfortable at this time . Eyes: Grossly normal lids, irises & conjunctiva . ENT: grossly tongue is normal . Neck: no obvious mass . Cardiovascular: S1 S2 normal no gallop . Respiratory: No rhonchi no rales are noted at this time . Abdomen: soft . Skin: no rash seen on limited exam . Musculoskeletal: not rigid . Psychiatric:unable to assess . Neurologic: no seizure no involuntary movements         Lab Data:   Basic Metabolic Panel: Recent Labs  Lab 10/11/20 0521 10/13/20 0609 10/16/20 0500  NA 130* 128* 130*  K 3.5 4.2 4.4  CL 97* 96* 99  CO2 $Re'23 23 22  'MbW$ GLUCOSE 219* 218* 86  BUN 52* 43* 70*  CREATININE 2.99* 2.88* 3.75*  CALCIUM 9.1 9.2 9.5  MG 1.7 2.1 2.4  PHOS 4.4 5.8* 4.8*    ABG: Recent Labs  Lab 10/12/20 1656  PHART 7.372  PCO2ART 44.5  PO2ART 80.9*  HCO3 25.3  O2SAT 96.1    Liver Function Tests: Recent Labs  Lab 10/11/20 0521 10/13/20 0609 10/16/20 0500  ALBUMIN 1.6* 1.8* 1.5*   No results for input(s): LIPASE, AMYLASE in the last 168 hours. No results for input(s): AMMONIA in the last 168 hours.  CBC: Recent Labs  Lab 10/11/20 0521 10/13/20 0609 10/16/20 0500 10/17/20 0501  WBC 16.7* 19.2* 20.8* 19.1*  HGB 8.0* 8.3* 7.2* 7.2*  HCT 25.4* 27.3* 24.0* 24.6*  MCV 91.7 92.5 96.8  98.8  PLT 500* 660* 739* 678*    Cardiac Enzymes: No results for input(s): CKTOTAL, CKMB, CKMBINDEX, TROPONINI in the last 168 hours.  BNP (last 3 results) No results for input(s): BNP in the last 8760 hours.  ProBNP (last 3 results) No results for input(s): PROBNP in the last 8760 hours.  Radiological Exams: No results found.  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 Acute on chronic respiratory failure hypoxia plan is to continue with capping working towards decannulation. 2. End-stage renal failure on hemodialysis continue to follow 3. Severe sepsis with shock resolved 4. Cardiac arrest rhythm stable 5. Metabolic encephalopathy no change we will continue to monitor   I have personally seen and evaluated the patient, evaluated laboratory and imaging results, formulated the assessment and plan and placed orders. The Patient requires high complexity decision making with multiple systems involvement.  Rounds were done with the Respiratory Therapy Director and Staff therapists and discussed with nursing staff also.  Allyne Gee, MD [facility] Pulmonary Critical Care Medicine Sleep Medicine

## 2020-10-18 DIAGNOSIS — J9621 Acute and chronic respiratory failure with hypoxia: Secondary | ICD-10-CM | POA: Diagnosis not present

## 2020-10-18 DIAGNOSIS — I469 Cardiac arrest, cause unspecified: Secondary | ICD-10-CM | POA: Diagnosis not present

## 2020-10-18 DIAGNOSIS — G9341 Metabolic encephalopathy: Secondary | ICD-10-CM | POA: Diagnosis not present

## 2020-10-18 DIAGNOSIS — N186 End stage renal disease: Secondary | ICD-10-CM | POA: Diagnosis not present

## 2020-10-18 LAB — RENAL FUNCTION PANEL
Albumin: 1.7 g/dL — ABNORMAL LOW (ref 3.5–5.0)
Anion gap: 12 (ref 5–15)
BUN: 56 mg/dL — ABNORMAL HIGH (ref 6–20)
CO2: 22 mmol/L (ref 22–32)
Calcium: 9.7 mg/dL (ref 8.9–10.3)
Chloride: 97 mmol/L — ABNORMAL LOW (ref 98–111)
Creatinine, Ser: 3.3 mg/dL — ABNORMAL HIGH (ref 0.61–1.24)
GFR, Estimated: 21 mL/min — ABNORMAL LOW (ref >60)
Glucose, Bld: 77 mg/dL (ref 70–99)
Phosphorus: 3.9 mg/dL (ref 2.5–4.6)
Potassium: 4.1 mmol/L (ref 3.5–5.1)
Sodium: 131 mmol/L — ABNORMAL LOW (ref 135–145)

## 2020-10-18 LAB — VANCOMYCIN, TROUGH: Vancomycin Tr: 22 ug/mL (ref 15–20)

## 2020-10-18 LAB — CBC
HCT: 25.2 % — ABNORMAL LOW (ref 39.0–52.0)
Hemoglobin: 7.4 g/dL — ABNORMAL LOW (ref 13.0–17.0)
MCH: 28.8 pg (ref 26.0–34.0)
MCHC: 29.4 g/dL — ABNORMAL LOW (ref 30.0–36.0)
MCV: 98.1 fL (ref 80.0–100.0)
Platelets: 737 10*3/uL — ABNORMAL HIGH (ref 150–400)
RBC: 2.57 MIL/uL — ABNORMAL LOW (ref 4.22–5.81)
RDW: 20.7 % — ABNORMAL HIGH (ref 11.5–15.5)
WBC: 20.5 10*3/uL — ABNORMAL HIGH (ref 4.0–10.5)
nRBC: 0.2 % (ref 0.0–0.2)

## 2020-10-18 LAB — MAGNESIUM: Magnesium: 2.2 mg/dL (ref 1.7–2.4)

## 2020-10-18 NOTE — Progress Notes (Signed)
Pulmonary Critical Care Medicine [place]   PULMONARY CRITICAL CARE SERVICE  PROGRESS NOTE  Date of Service: 10/18/2020  Jeremy Yu  IHK:[id number]  DOB: 01/08/1966   DOA: 09/22/2020  Referring Physician: Merton Border, MD  HPI: Jeremy Yu is a 54 y.o. male seen for follow up of Acute on Chronic Respiratory Failure.  Patient is capping right now on room air today will be 48 hours  Medications: Reviewed on Rounds  Physical Exam:  Vitals: Temperature is 97.6 pulse 97 respiratory rate 20 blood pressure is 134/78 saturations 100%  Ventilator Settings capping on room air  . General: Comfortable at this time . Eyes: Grossly normal lids, irises & conjunctiva . ENT: grossly tongue is normal . Neck: no obvious mass . Cardiovascular: S1 S2 normal no gallop . Respiratory: No rhonchi very coarse breath sounds . Abdomen: soft . Skin: no rash seen on limited exam . Musculoskeletal: not rigid . Psychiatric:unable to assess . Neurologic: no seizure no involuntary movements         Lab Data:   Basic Metabolic Panel: Recent Labs  Lab 10/13/20 0609 10/16/20 0500 10/18/20 0529  NA 128* 130* 131*  K 4.2 4.4 4.1  CL 96* 99 97*  CO2 $Re'23 22 22  'gCt$ GLUCOSE 218* 86 77  BUN 43* 70* 56*  CREATININE 2.88* 3.75* 3.30*  CALCIUM 9.2 9.5 9.7  MG 2.1 2.4 2.2  PHOS 5.8* 4.8* 3.9    ABG: Recent Labs  Lab 10/12/20 1656  PHART 7.372  PCO2ART 44.5  PO2ART 80.9*  HCO3 25.3  O2SAT 96.1    Liver Function Tests: Recent Labs  Lab 10/13/20 0609 10/16/20 0500 10/18/20 0529  ALBUMIN 1.8* 1.5* 1.7*   No results for input(s): LIPASE, AMYLASE in the last 168 hours. No results for input(s): AMMONIA in the last 168 hours.  CBC: Recent Labs  Lab 10/13/20 0609 10/16/20 0500 10/17/20 0501 10/18/20 0529  WBC 19.2* 20.8* 19.1* 20.5*  HGB 8.3* 7.2* 7.2* 7.4*  HCT 27.3* 24.0* 24.6* 25.2*  MCV 92.5 96.8 98.8 98.1  PLT 660* 739* 678* 737*    Cardiac  Enzymes: No results for input(s): CKTOTAL, CKMB, CKMBINDEX, TROPONINI in the last 168 hours.  BNP (last 3 results) No results for input(s): BNP in the last 8760 hours.  ProBNP (last 3 results) No results for input(s): PROBNP in the last 8760 hours.  Radiological Exams: No results found.  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 Acute on chronic respiratory failure hypoxia we will continue with the capping trials as ordered.  Patient will be completing 48 hours. 2. End-stage renal disease on hemodialysis being followed by nephrology 3. Severe sepsis with shock resolved hemodynamics are stable 4. Cardiac arrest rhythm is stable 5. Metabolic encephalopathy no change   I have personally seen and evaluated the patient, evaluated laboratory and imaging results, formulated the assessment and plan and placed orders. The Patient requires high complexity decision making with multiple systems involvement.  Rounds were done with the Respiratory Therapy Director and Staff therapists and discussed with nursing staff also.  Allyne Gee, MD [facility] Pulmonary Critical Care Medicine Sleep Medicine

## 2020-10-18 NOTE — Progress Notes (Signed)
[facility]  ROUNDING NOTE   Subjective:  Overall patient doing much better.  His tracheostomy has been capped. He is able to verbalize. Due for dialysis treatment today.  Objective:  Vital signs in last 24 hours:  Temperature 97.6 pulse 97 respirations 20 blood pressure 134/78   Physical Exam: General:  Chronically ill-appearing  Head:  Normocephalic, atraumatic. Moist oral mucosal membranes  Eyes:  Anicteric  Neck:  Tracheostomy in place, capped  Lungs:   Coarse breath sounds bilateral, normal effort  Heart:  S1S2 no rubs  Abdomen:   Soft, nontender, bowel sounds present, PEG in place, colostomy  Extremities:  Right AKA  Neurologic:  Awake, alert, follows commands  Skin:  No lesions  Access:  Left upper extremity AV fistula    Basic Metabolic Panel: Recent Labs  Lab 10/13/20 0609 10/16/20 0500 10/18/20 0529  NA 128* 130* 131*  K 4.2 4.4 4.1  CL 96* 99 97*  CO2 $Re'23 22 22  'SZZ$ GLUCOSE 218* 86 77  BUN 43* 70* 56*  CREATININE 2.88* 3.75* 3.30*  CALCIUM 9.2 9.5 9.7  MG 2.1 2.4 2.2  PHOS 5.8* 4.8* 3.9    Liver Function Tests: Recent Labs  Lab 10/13/20 0609 10/16/20 0500 10/18/20 0529  ALBUMIN 1.8* 1.5* 1.7*   No results for input(s): LIPASE, AMYLASE in the last 168 hours. No results for input(s): AMMONIA in the last 168 hours.  CBC: Recent Labs  Lab 10/13/20 0609 10/16/20 0500 10/17/20 0501 10/18/20 0529  WBC 19.2* 20.8* 19.1* 20.5*  HGB 8.3* 7.2* 7.2* 7.4*  HCT 27.3* 24.0* 24.6* 25.2*  MCV 92.5 96.8 98.8 98.1  PLT 660* 739* 678* 737*    Cardiac Enzymes: No results for input(s): CKTOTAL, CKMB, CKMBINDEX, TROPONINI in the last 168 hours.  BNP: Invalid input(s): POCBNP  CBG: No results for input(s): GLUCAP in the last 168 hours.  Microbiology: Results for orders placed or performed during the hospital encounter of 09/22/20  Culture, respiratory (non-expectorated)     Status: None   Collection Time: 09/23/20 11:25 AM   Specimen:  Tracheal Aspirate; Respiratory  Result Value Ref Range Status   Specimen Description TRACHEAL ASPIRATE  Final   Special Requests NONE  Final   Gram Stain   Final    RARE WBC PRESENT,BOTH PMN AND MONONUCLEAR NO ORGANISMS SEEN Performed at [facility], [address], [place], [place] [address]    Culture FEW KLEBSIELLA PNEUMONIAE  Final   Report Status 09/25/2020 FINAL  Final   Organism ID, Bacteria KLEBSIELLA PNEUMONIAE  Final      Susceptibility   Klebsiella pneumoniae - MIC*    AMPICILLIN >=32 RESISTANT Resistant     CEFAZOLIN <=4 SENSITIVE Sensitive     CEFEPIME 0.25 SENSITIVE Sensitive     CEFTAZIDIME <=1 SENSITIVE Sensitive     CEFTRIAXONE 1 SENSITIVE Sensitive     CIPROFLOXACIN <=0.25 SENSITIVE Sensitive     GENTAMICIN <=1 SENSITIVE Sensitive     IMIPENEM <=0.25 SENSITIVE Sensitive     TRIMETH/SULFA <=20 SENSITIVE Sensitive     AMPICILLIN/SULBACTAM 16 INTERMEDIATE Intermediate     PIP/TAZO 64 INTERMEDIATE Intermediate     * FEW KLEBSIELLA PNEUMONIAE  Culture, blood (routine x 2)     Status: None   Collection Time: 09/23/20  4:26 PM   Specimen: BLOOD  Result Value Ref Range Status   Specimen Description BLOOD BLOOD RIGHT HAND  Final   Special Requests   Final    AEROBIC BOTTLE ONLY Blood Culture  results may not be optimal due to an inadequate volume of blood received in culture bottles   Culture   Final    NO GROWTH 5 DAYS Performed at [facility], [place] [address], [place], [place] [address]    Report Status 09/28/2020 FINAL  Final  Culture, blood (single)     Status: None   Collection Time: 09/24/20  6:00 AM   Specimen: BLOOD RIGHT HAND  Result Value Ref Range Status   Specimen Description BLOOD RIGHT HAND  Final   Special Requests   Final    BOTTLES DRAWN AEROBIC ONLY Blood Culture results may not be optimal due to an inadequate volume of blood received in culture bottles   Culture   Final    NO GROWTH 5 DAYS Performed at [facility],  [place] [address] [place], [place] [address]    Report Status 09/29/2020 FINAL  Final  Body fluid culture     Status: None   Collection Time: 09/29/20 12:27 PM   Specimen: PATH Cytology Peritoneal fluid  Result Value Ref Range Status   Specimen Description PERITONEAL FLUID  Final   Special Requests NONE  Final   Gram Stain   Final    WBC PRESENT,BOTH PMN AND MONONUCLEAR NO ORGANISMS SEEN CYTOSPIN SMEAR    Culture   Final    NO GROWTH 3 DAYS Performed at [facility], [address], [place], [place] [address]    Report Status 10/03/2020 FINAL  Final  Culture, blood (Routine X 2) w Reflex to ID Panel     Status: Abnormal   Collection Time: 10/04/20 12:50 PM   Specimen: BLOOD  Result Value Ref Range Status   Specimen Description BLOOD RIGHT ANTECUBITAL  Final   Special Requests   Final    BOTTLES DRAWN AEROBIC AND ANAEROBIC Blood Culture adequate volume   Culture  Setup Time   Final    GRAM POSITIVE COCCI IN CLUSTERS AEROBIC BOTTLE ONLY CRITICAL RESULT CALLED TO, READ BACK BY AND VERIFIED WITH: A. [place] Silos RN 16:20 10/05/20 ([email or web address]) Performed at [facility], [place] [address], [place], [place] [address]    Culture STAPHYLOCOCCUS EPIDERMIDIS (A)  Final   Report Status 10/07/2020 FINAL  Final   Organism ID, Bacteria STAPHYLOCOCCUS EPIDERMIDIS  Final      Susceptibility   Staphylococcus epidermidis - MIC*    CIPROFLOXACIN >=8 RESISTANT Resistant     ERYTHROMYCIN >=8 RESISTANT Resistant     GENTAMICIN >=16 RESISTANT Resistant     OXACILLIN >=4 RESISTANT Resistant     TETRACYCLINE 2 SENSITIVE Sensitive     VANCOMYCIN 2 SENSITIVE Sensitive     TRIMETH/SULFA 160 RESISTANT Resistant     CLINDAMYCIN >=8 RESISTANT Resistant     RIFAMPIN <=0.5 SENSITIVE Sensitive     Inducible Clindamycin NEGATIVE Sensitive     * STAPHYLOCOCCUS EPIDERMIDIS  Blood Culture ID Panel (Reflexed)     Status: Abnormal   Collection Time: 10/04/20 12:50 PM  Result Value Ref Range Status    Enterococcus faecalis NOT DETECTED NOT DETECTED Final   Enterococcus Faecium NOT DETECTED NOT DETECTED Final   Listeria monocytogenes NOT DETECTED NOT DETECTED Final   Staphylococcus species DETECTED (A) NOT DETECTED Final    Comment: CRITICAL RESULT CALLED TO, READ BACK BY AND VERIFIED WITH: A. [place] Silos RN 16:20 10/05/20 ([email or web address])    Staphylococcus aureus (BCID) NOT DETECTED NOT DETECTED Final   Staphylococcus epidermidis DETECTED (A) NOT DETECTED Final    Comment:  Methicillin (oxacillin) resistant coagulase negative staphylococcus. Possible blood culture contaminant (unless isolated from more than one blood culture draw or clinical case suggests pathogenicity). No antibiotic treatment is indicated for blood  culture contaminants. CRITICAL RESULT CALLED TO, READ BACK BY AND VERIFIED WITH: A. [place] Silos RN 16:20 10/05/20 ([email or web address])    Staphylococcus lugdunensis NOT DETECTED NOT DETECTED Final   Streptococcus species NOT DETECTED NOT DETECTED Final   Streptococcus agalactiae NOT DETECTED NOT DETECTED Final   Streptococcus pneumoniae NOT DETECTED NOT DETECTED Final   Streptococcus pyogenes NOT DETECTED NOT DETECTED Final   A.calcoaceticus-baumannii NOT DETECTED NOT DETECTED Final   Bacteroides fragilis NOT DETECTED NOT DETECTED Final   Enterobacterales NOT DETECTED NOT DETECTED Final   Enterobacter cloacae complex NOT DETECTED NOT DETECTED Final   Escherichia coli NOT DETECTED NOT DETECTED Final   Klebsiella aerogenes NOT DETECTED NOT DETECTED Final   Klebsiella oxytoca NOT DETECTED NOT DETECTED Final   Klebsiella pneumoniae NOT DETECTED NOT DETECTED Final   Proteus species NOT DETECTED NOT DETECTED Final   Salmonella species NOT DETECTED NOT DETECTED Final   Serratia marcescens NOT DETECTED NOT DETECTED Final   Haemophilus influenzae NOT DETECTED NOT DETECTED Final   Neisseria meningitidis NOT DETECTED NOT DETECTED Final   Pseudomonas aeruginosa NOT DETECTED NOT DETECTED Final    Stenotrophomonas maltophilia NOT DETECTED NOT DETECTED Final   Candida albicans NOT DETECTED NOT DETECTED Final   Candida auris NOT DETECTED NOT DETECTED Final   Candida glabrata NOT DETECTED NOT DETECTED Final   Candida krusei NOT DETECTED NOT DETECTED Final   Candida parapsilosis NOT DETECTED NOT DETECTED Final   Candida tropicalis NOT DETECTED NOT DETECTED Final   Cryptococcus neoformans/gattii NOT DETECTED NOT DETECTED Final   Methicillin resistance mecA/C DETECTED (A) NOT DETECTED Final    Comment: CRITICAL RESULT CALLED TO, READ BACK BY AND VERIFIED WITH: A. [place] Silos RN 16:20 10/05/20 ([email or web address]) Performed at [facility], [place] [address], [place], [place] [address]   Culture, blood (Routine X 2) w Reflex to ID Panel     Status: Abnormal   Collection Time: 10/04/20 12:51 PM   Specimen: BLOOD RIGHT HAND  Result Value Ref Range Status   Specimen Description BLOOD RIGHT HAND  Final   Special Requests   Final    BOTTLES DRAWN AEROBIC AND ANAEROBIC Blood Culture adequate volume   Culture  Setup Time   Final    GRAM POSITIVE COCCI AEROBIC BOTTLE ONLY CRITICAL VALUE NOTED.  VALUE IS CONSISTENT WITH PREVIOUSLY REPORTED AND CALLED VALUE.    Culture (A)  Final    STAPHYLOCOCCUS EPIDERMIDIS SUSCEPTIBILITIES PERFORMED ON PREVIOUS CULTURE WITHIN THE LAST 5 DAYS. Performed at [facility], [place] [address] [place], [place] [address]    Report Status 10/07/2020 FINAL  Final  C Difficile Quick Screen (NO PCR Reflex)     Status: None   Collection Time: 10/04/20  5:19 PM   Specimen: STOOL  Result Value Ref Range Status   C Diff antigen NEGATIVE NEGATIVE Final   C Diff toxin NEGATIVE NEGATIVE Final   C Diff interpretation No C. difficile detected.  Final    Comment: Performed at [facility], [place] [address], [place], [place] [address]  Culture, respiratory (non-expectorated)     Status: None   Collection Time: 10/05/20 10:14 PM   Specimen: Tracheal Aspirate; Respiratory   Result Value Ref Range Status   Specimen Description TRACHEAL ASPIRATE  Final   Special Requests NONE  Final  Gram Stain   Final    MODERATE WBC PRESENT, PREDOMINANTLY PMN MODERATE GRAM NEGATIVE RODS FEW YEAST RARE GRAM POSITIVE COCCI IN CHAINS Performed at [facility], [place] [address], [place], [place] [address]    Culture MODERATE KLEBSIELLA PNEUMONIAE  Final   Report Status 10/08/2020 FINAL  Final   Organism ID, Bacteria KLEBSIELLA PNEUMONIAE  Final      Susceptibility   Klebsiella pneumoniae - MIC*    AMPICILLIN RESISTANT Resistant     CEFAZOLIN <=4 SENSITIVE Sensitive     CEFEPIME <=0.12 SENSITIVE Sensitive     CEFTAZIDIME <=1 SENSITIVE Sensitive     CEFTRIAXONE <=0.25 SENSITIVE Sensitive     CIPROFLOXACIN >=4 RESISTANT Resistant     GENTAMICIN <=1 SENSITIVE Sensitive     IMIPENEM <=0.25 SENSITIVE Sensitive     TRIMETH/SULFA <=20 SENSITIVE Sensitive     AMPICILLIN/SULBACTAM 8 SENSITIVE Sensitive     PIP/TAZO 16 SENSITIVE Sensitive     * MODERATE KLEBSIELLA PNEUMONIAE  Culture, blood (routine x 2)     Status: None   Collection Time: 10/08/20  4:17 PM   Specimen: BLOOD RIGHT WRIST  Result Value Ref Range Status   Specimen Description BLOOD RIGHT WRIST  Final   Special Requests   Final    BOTTLES DRAWN AEROBIC ONLY Blood Culture results may not be optimal due to an excessive volume of blood received in culture bottles   Culture   Final    NO GROWTH 5 DAYS Performed at [facility], [place] [address], [place] [address]    Report Status 10/13/2020 FINAL  Final  Culture, blood (routine x 2)     Status: None   Collection Time: 10/08/20  4:17 PM   Specimen: BLOOD RIGHT HAND  Result Value Ref Range Status   Specimen Description BLOOD RIGHT HAND  Final   Special Requests   Final    BOTTLES DRAWN AEROBIC ONLY Blood Culture results may not be optimal due to an excessive volume of blood received in culture bottles   Culture   Final    NO GROWTH 5  DAYS Performed at [facility], [place] [address] [place], [place] [address]    Report Status 10/13/2020 FINAL  Final  Culture, respiratory (non-expectorated)     Status: None   Collection Time: 10/14/20  1:41 PM   Specimen: Tracheal Aspirate; Respiratory  Result Value Ref Range Status   Specimen Description TRACHEAL ASPIRATE  Final   Special Requests NONE  Final   Gram Stain   Final    RARE WBC PRESENT, PREDOMINANTLY PMN RARE GRAM NEGATIVE RODS    Culture   Final    RARE Normal respiratory flora-no Staph aureus or Pseudomonas seen Performed at [facility], [address], [place],  [address]    Report Status 10/17/2020 FINAL  Final    Coagulation Studies: No results for input(s): LABPROT, INR in the last 72 hours.  Urinalysis: No results for input(s): COLORURINE, LABSPEC, PHURINE, GLUCOSEU, HGBUR, BILIRUBINUR, KETONESUR, PROTEINUR, UROBILINOGEN, NITRITE, LEUKOCYTESUR in the last 72 hours.  Invalid input(s): APPERANCEUR    Imaging: No results found.   Medications:     iohexol, lidocaine  Assessment/ Plan:  54 y.o. male  with a PMHx of ESRD on HD, C. difficile colitis with subsequent colonic resection and colostomy placement, severe protein calorie malnutrition, diabetes mellitus type 2, anemia of chronic kidney disease, combined systolic and diastolic heart failure, hyperlipidemia, fatty liver disease, sacral decubitus  ulcer, anemia of chronic kidney disease, secondary hyperparathyroidism, right above-the-knee amputation, who was admitted to [facility] on 09/22/2020 for ongoing care.   1.  ESRD on HD.  Overall patient doing much better.  He is due for dialysis treatment today.  Orders have been prepared.  2.  Hypotension.  Continue midodrine and albumin for blood pressure support with dialysis treatments.  3.  Anemia of chronic kidney disease.   Lab Results  Component Value Date   HGB 7.4 (L) 10/18/2020  Hemoglobin up slightly to 7.4.  We  will continue to check CBC.  4.  Secondary hyperparathyroidism.  Phosphorus at target at 3.9.  5.  Acute respiratory failure.  Patient liberated from the ventilator.  Breathing comfortably.  Tracheostomy capped.    LOS: 0 Gee Habig 10/27/20211:17 PM

## 2020-10-19 DIAGNOSIS — G9341 Metabolic encephalopathy: Secondary | ICD-10-CM | POA: Diagnosis not present

## 2020-10-19 DIAGNOSIS — J9621 Acute and chronic respiratory failure with hypoxia: Secondary | ICD-10-CM | POA: Diagnosis not present

## 2020-10-19 DIAGNOSIS — N186 End stage renal disease: Secondary | ICD-10-CM | POA: Diagnosis not present

## 2020-10-19 DIAGNOSIS — I469 Cardiac arrest, cause unspecified: Secondary | ICD-10-CM | POA: Diagnosis not present

## 2020-10-19 NOTE — Progress Notes (Signed)
Pulmonary Critical Care Medicine [place]   PULMONARY CRITICAL CARE SERVICE  PROGRESS NOTE  Date of Service: 10/19/2020  Jeremy Yu  SFK:[id number]  DOB: [birth year]   DOA: 09/22/2020  Referring Physician: Merton Border, MD  HPI: Jeremy Yu is a 54 y.o. male seen for follow up of Acute on Chronic Respiratory Failure.  Patient remains capping has been on room air some issues with vomiting still  Medications: Reviewed on Rounds  Physical Exam:  Vitals: Temperature is 97.1 pulse 110 respiratory rate 21 blood pressure is 118/65 saturations 94%  Ventilator Settings capping on room air  . General: Comfortable at this time . Eyes: Grossly normal lids, irises & conjunctiva . ENT: grossly tongue is normal . Neck: no obvious mass . Cardiovascular: S1 S2 normal no gallop . Respiratory: No rhonchi or rales are noted at this time . Abdomen: soft . Skin: no rash seen on limited exam . Musculoskeletal: not rigid . Psychiatric:unable to assess . Neurologic: no seizure no involuntary movements         Lab Data:   Basic Metabolic Panel: Recent Labs  Lab 10/13/20 0609 10/16/20 0500 10/18/20 0529  NA 128* 130* 131*  K 4.2 4.4 4.1  CL 96* 99 97*  CO2 $Re'23 22 22  'Bxr$ GLUCOSE 218* 86 77  BUN 43* 70* 56*  CREATININE 2.88* 3.75* 3.30*  CALCIUM 9.2 9.5 9.7  MG 2.1 2.4 2.2  PHOS 5.8* 4.8* 3.9    ABG: Recent Labs  Lab 10/12/20 1656  PHART 7.372  PCO2ART 44.5  PO2ART 80.9*  HCO3 25.3  O2SAT 96.1    Liver Function Tests: Recent Labs  Lab 10/13/20 0609 10/16/20 0500 10/18/20 0529  ALBUMIN 1.8* 1.5* 1.7*   No results for input(s): LIPASE, AMYLASE in the last 168 hours. No results for input(s): AMMONIA in the last 168 hours.  CBC: Recent Labs  Lab 10/13/20 0609 10/16/20 0500 10/17/20 0501 10/18/20 0529  WBC 19.2* 20.8* 19.1* 20.5*  HGB 8.3* 7.2* 7.2* 7.4*  HCT 27.3* 24.0* 24.6* 25.2*  MCV 92.5 96.8 98.8 98.1  PLT 660* 739* 678* 737*     Cardiac Enzymes: No results for input(s): CKTOTAL, CKMB, CKMBINDEX, TROPONINI in the last 168 hours.  BNP (last 3 results) No results for input(s): BNP in the last 8760 hours.  ProBNP (last 3 results) No results for input(s): PROBNP in the last 8760 hours.  Radiological Exams: No results found.  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 Acute on chronic respiratory failure with hypoxia we will continue with capping trials hold off on decannulation because of the vomiting issues. 2. End-stage renal failure on hemodialysis being followed by nephrology 3. Severe sepsis with shock resolved 4. Cardiac arrest rhythm has been stable 5. Metabolic encephalopathy no change supportive care   I have personally seen and evaluated the patient, evaluated laboratory and imaging results, formulated the assessment and plan and placed orders. The Patient requires high complexity decision making with multiple systems involvement.  Rounds were done with the Respiratory Therapy Director and Staff therapists and discussed with nursing staff also.  Allyne Gee, MD [facility] Pulmonary Critical Care Medicine Sleep Medicine

## 2020-10-20 DIAGNOSIS — N186 End stage renal disease: Secondary | ICD-10-CM | POA: Diagnosis not present

## 2020-10-20 DIAGNOSIS — G9341 Metabolic encephalopathy: Secondary | ICD-10-CM | POA: Diagnosis not present

## 2020-10-20 DIAGNOSIS — I469 Cardiac arrest, cause unspecified: Secondary | ICD-10-CM | POA: Diagnosis not present

## 2020-10-20 DIAGNOSIS — J9621 Acute and chronic respiratory failure with hypoxia: Secondary | ICD-10-CM | POA: Diagnosis not present

## 2020-10-20 LAB — CBC
HCT: 25 % — ABNORMAL LOW (ref 39.0–52.0)
Hemoglobin: 7.1 g/dL — ABNORMAL LOW (ref 13.0–17.0)
MCH: 28.3 pg (ref 26.0–34.0)
MCHC: 28.4 g/dL — ABNORMAL LOW (ref 30.0–36.0)
MCV: 99.6 fL (ref 80.0–100.0)
Platelets: 806 10*3/uL — ABNORMAL HIGH (ref 150–400)
RBC: 2.51 MIL/uL — ABNORMAL LOW (ref 4.22–5.81)
RDW: 20.7 % — ABNORMAL HIGH (ref 11.5–15.5)
WBC: 27.4 10*3/uL — ABNORMAL HIGH (ref 4.0–10.5)
nRBC: 0.3 % — ABNORMAL HIGH (ref 0.0–0.2)

## 2020-10-20 LAB — RENAL FUNCTION PANEL
Albumin: 2 g/dL — ABNORMAL LOW (ref 3.5–5.0)
Anion gap: 12 (ref 5–15)
BUN: 54 mg/dL — ABNORMAL HIGH (ref 6–20)
CO2: 25 mmol/L (ref 22–32)
Calcium: 9.9 mg/dL (ref 8.9–10.3)
Chloride: 95 mmol/L — ABNORMAL LOW (ref 98–111)
Creatinine, Ser: 3.43 mg/dL — ABNORMAL HIGH (ref 0.61–1.24)
GFR, Estimated: 20 mL/min — ABNORMAL LOW (ref >60)
Glucose, Bld: 102 mg/dL — ABNORMAL HIGH (ref 70–99)
Phosphorus: 3.6 mg/dL (ref 2.5–4.6)
Potassium: 3.8 mmol/L (ref 3.5–5.1)
Sodium: 132 mmol/L — ABNORMAL LOW (ref 135–145)

## 2020-10-20 LAB — TRIGLYCERIDES: Triglycerides: 131 mg/dL (ref <150)

## 2020-10-20 LAB — VANCOMYCIN, TROUGH: Vancomycin Tr: 16 ug/mL (ref 15–20)

## 2020-10-20 LAB — MAGNESIUM: Magnesium: 2.1 mg/dL (ref 1.7–2.4)

## 2020-10-20 NOTE — Progress Notes (Signed)
PROGRESS NOTE    Jeremy Yu  XJD:[id number] DOB: 1966/07/02 DOA: 09/22/2020  Brief Narrative:  Jeremy Yu is an 54 y.o. male with medical history significant of end-stage renal disease on hemodialysis, diabetic nephropathy, chronic systolic and diastolic congestive heart failure, peripheral arterial disease, complete heart block, seizures, hyperlipidemia, hypertension, prior MI, GERD who had recent hospitalization from 07/17/2020 until 07/25/2020 for necrotizing soft tissue infection, sepsis.  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  He was discharged on 07/25/2020.  Patient apparently was at [facility] and rehab facility.  While undergoing dialysis on 08/20/2019 when he developed chills, rigors.  Blood cultures were taken and he was admitted to [facility] on 08/19/2020.  He was given IV vancomycin, ceftriaxone for probable sepsis.  He was found to have diarrhea and stool for C. difficile was positive.  Patient was placed on oral vancomycin.  CT on 08/20/2020 confirmed pancolitis with large left inguinal hernia, airspace disease with concern for pneumonia.  On 08/22/2019 when he became hypotensive, both IV and p.o. Flagyl was added to the p.o. vancomycin.  Infectious disease was consulted with addition of Dificid.  However, patient continued to worsen.  On 08/24/2019 when he was transferred urgently to the ICU and intubated for airway protection because he was becoming encephalopathic.  He was also given vasopressors for shock.  Fecal transplant was attempted by GI at the bedside however, inability to pass the scope due to large nonreducible inguinal hernia.  Surgery was consulted and patient was taken emergently for subtotal colectomy and ileostomy.  Eravacycline was added for C. difficile/toxic megacolon.   He was seen by surgery with clearance for tube feeds on 08/25/2020.  Patient postoperatively remained encephalopathic.  Gradually mental status improved.  However, he had significant drop in platelet count on 08/27/2020.  Hematology was consulted and they thought the thrombocytopenia was likely secondary to sepsis, recent surgery, critical illness, DIC.  He was given platelet transfusion with additional PRBC due to oozing from the surgical site, drop in hemoglobin levels.  He was also given vitamin K for ongoing liver dysfunction.  He apparently received total of 8 units platelets.  Repeat CT was done which showed left lingular consolidation, bilateral effusions.  Abdomen showed postop free fluid, gallstones.  He had progressive thrombocytopenia, abnormal LFTs therefore Flagyl, Eravacycline was stopped.  He had blood cultures on 08/28/2020 that showed staph.  He was treated with empiric antibiotics.  He was also treated with micafungin. On 09/04/2020 patient had PEA arrest during which she was intubated, subsequently underwent EGD with evidence of bleeding ulcer, gastritis.  He continued to have oozing from his ileostomy and was receiving blood products.  He was placed on PPI drip.  Right upper quadrant ultrasound showed possible cholecystitis.  He was to undergo drain placement but underwent HIDA on 09/09/2020, gallbladder was not visualized.  On 09/11/2019 when he had large amount of blood from the stoma and received PRBC.  He had EGD with bleeding ulcer that was clipped.  On 09/12/2019 when he had paracentesis with 5.2 L removed.  Patient had fluid cultures grew VRE for which he was treated with daptomycin for 14 days.  He failed ventilator weaning efforts therefore tracheostomy was done on 09/14/2020.  He has unstageable sacral pressure ulcer for which he received wound care. He was transferred and admitted to [facility] on 09/22/2020. -  He has trach, on vent, on 35% FiO2.  After admission here he started  having increasing secretions.  Respiratory cultures showed Klebsiella.  He received treatment with ciprofloxacin, Flagyl. 10/05/2020: Patient had worsening leukocytosis.  He was also hypotensive per the primary team and started on Levophed drip.  He underwent paracentesis last week with removal of 800 cc.  KUB per report progressive dilatation of the stomach and small bowel. Possible small bowel obstruction. He had blood cultures from 10/04/2020 that showed staph epidermidis. 10/12/2020: He received treatment with IV vancomycin, meropenem, Flagyl for C. Difficile. 10/20/2020: Patient having worsening WBC count.  His respiratory cultures showing normal respiratory flora. -He has been started on IV vancomycin.   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  He had severe sepsis with septic shock at the acute facility.  He also had PEA arrest at the outside facility and had to be intubated.  He had pneumonia with Klebsiella.  He received treatment with ciprofloxacin, metronidazole.  However, after that had worsening leukocytosis with chest x-ray findings consistent for persistent right lower lobe pneumonia.  Therefore switched to IV meropenem, IV vancomycin empirically, Flagyl added for C. difficile prophylaxis.  He completed antibiotics.  However, after completion of antibiotics now having worsening WBC count again. -Respiratory cultures from 10/14/2020  showing normal respiratory flora. -Due to his worsening WBC count he has been started on empiric IV vancomycin.  His blood cultures previously showed staph epidermidis but repeat cultures did not show growth.  -At this time would recommend to continue monitoring the WBC count.  If the WBC count is continuing to be elevated then consider starting him on IV meropenem since his records indicate severe allergy to penicillin.  Would also suggest to add IV Flagyl along with the p.o. vancomycin given his severe C. difficile.  Sepsis with shock: Patient was on pressors at the outside facility. Shock is resolved.  He already completed antibiotics.  However, after the antibiotics completed now having worsening leukocytosis again. He is high risk for recurrent sepsis.  Empiric antibiotics as mentioned above.  Continue to monitor closely.  Leukocytosis: Patient previously had worsening leukocytosis.  Treated with antibiotics as mentioned above.  However, soon as his antibiotics completed his WBC count now worsening again. As mentioned above, he is high risk for recurrent sepsis.  Restarted empiric antibiotics with plan as mentioned above.  If his counts are worsening despite being on antibiotics then we may need to consider CT imaging again to evaluate further foci of infection.  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 Continue local wound care.  Antibiotics as mentioned above. On p.o. vancomycin for C. difficile prophylaxis.  Peritonitis: Patient had peritonitis at the acute facility with peritoneal fluid cultures that showed VRE.  He status post treatment with 14 days of daptomycin.  Previous CT of the abdomen and pelvis from here showed large volume ascites. He is status post paracentesis.  Gram stain showed WBC with PMN.  However, was unable to find the cell count/differential on the  fluid.  Cultures remain negative.  Regardless he is being started on empiric  antibiotics due to worsening leukocytosis. He is at high risk for recurrent peritonitis. Continue to monitor closely.  Cholecystitis: He also had cholecystitis and is status post drain placement.  Antibiotics as mentioned above.  End-stage renal disease on dialysis: Dialysis per nephrology.  Antibiotics renally dosed.  Necrotizing fasciitis status post right AKA: Continue local wound care.  Diabetes mellitus: Continue to monitor Accu-Cheks, medications and management of diabetes per the primary team.  He will need proper glycemic control in order to enable healing.  Bleeding ulcer/gastritis: Status post EGD and clipping at the acute facility.  On PPI.  Continue to monitor.  Further management per primary team.  Sacrococcygeal pressure ulcer unstageable: Continue local wound care.  If worsening consider consulting surgery for possible debridement.  Thrombocytosis: Patient having elevated platelet count.  Continue to monitor.  Unfortunately unable to give aspirin because he also had GI bleed.  Anemia: Continue to monitor hemoglobin.  Further management per the primary team.  Unfortunately due to his complex medical problems he is high risk for worsening and decompensation.  Plan of care discussed with the patient, primary team and pharmacy.  Subjective: He is receiving dialysis.  His WBC count is worsening.  Objective: Vitals: Temperature 100.5, pulse 101, respiratory rate 20, blood pressure 129/65, pulse oximetry 95%  Examination: Constitutional: Chronically ill-appearing male, awake Head: Atraumatic, normocephalic Eyes: pupils equal and reactive, legally blind  ENMT: external ears and nose appear normal, normal hearing, Lips appear normal, moist oral mucosa Neck: Has trach in place CVS: S1-S2  Respiratory: Rhonchi, no wheezing Abdomen: Distended, hypoactive bowel sounds, right-sided drain, ostomy, PEG tube Musculoskeletal: Right lower extremity AKA Neuro: Legally blind,  has debility with generalized weakness Psych: stable mood Skin: no rashes     Data Reviewed: I have personally reviewed following labs and imaging studies  CBC: Recent Labs  Lab 10/16/20 0500 10/17/20 0501 10/18/20 0529 10/20/20 0557  WBC 20.8* 19.1* 20.5* 27.4*  HGB 7.2* 7.2* 7.4* 7.1*  HCT 24.0* 24.6* 25.2* 25.0*  MCV 96.8 98.8 98.1 99.6  PLT 739* 678* 737* 806*    Basic Metabolic Panel: Recent Labs  Lab 10/16/20 0500 10/18/20 0529 10/20/20 0557 10/20/20 0737  NA 130* 131*  --  132*  K 4.4 4.1  --  3.8  CL 99 97*  --  95*  CO2 22 22  --  25  GLUCOSE 86 77  --  102*  BUN 70* 56*  --  54*  CREATININE 3.75* 3.30*  --  3.43*  CALCIUM 9.5 9.7  --  9.9  MG 2.4 2.2 2.1  --   PHOS 4.8* 3.9  --  3.6    GFR: CrCl cannot be calculated (Unknown ideal weight.).  Liver Function Tests: Recent Labs  Lab 10/16/20 0500 10/18/20 0529 10/20/20 0737  ALBUMIN 1.5* 1.7* 2.0*    CBG: No results for input(s): GLUCAP in the last 168 hours.   Recent Results (from the past 240 hour(s))  Culture, respiratory (non-expectorated)     Status: None   Collection Time: 10/14/20  1:41 PM   Specimen: Tracheal Aspirate; Respiratory  Result Value Ref Range Status   Specimen Description TRACHEAL ASPIRATE  Final   Special Requests NONE  Final   Gram Stain   Final    RARE WBC PRESENT, PREDOMINANTLY PMN RARE GRAM NEGATIVE RODS    Culture   Final    RARE Normal respiratory flora-no Staph aureus  or Pseudomonas seen Performed at [facility], [place] [address] [place], [place] [address]    Report Status 10/17/2020 FINAL  Final      Radiology Studies: No results found.  Scheduled Meds: Please see MAR   Yaakov Guthrie, MD  10/20/2020, 3:57 PM

## 2020-10-20 NOTE — Progress Notes (Signed)
[facility]  ROUNDING NOTE   Subjective:  Patient seen and evaluated during dialysis treatment. Tolerating fairly well. Did have some vomiting earlier during the treatment.  Objective:  Vital signs in last 24 hours:  Temperature 97.9 pulse 94 respirations 21 blood pressure 108/63   Physical Exam: General:  Chronically ill-appearing  Head:  Normocephalic, atraumatic. Moist oral mucosal membranes  Eyes:  Anicteric  Neck:  Now decannulated  Lungs:   Coarse breath sounds bilateral, normal effort  Heart:  S1S2 no rubs  Abdomen:   Soft, nontender, bowel sounds present, PEG in place, colostomy  Extremities:  Right AKA  Neurologic:  Awake, alert, follows commands  Skin:  No lesions  Access:  Left upper extremity AV fistula    Basic Metabolic Panel: Recent Labs  Lab 10/16/20 0500 10/18/20 0529 10/20/20 0557 10/20/20 0737  NA 130* 131*  --  132*  K 4.4 4.1  --  3.8  CL 99 97*  --  95*  CO2 22 22  --  25  GLUCOSE 86 77  --  102*  BUN 70* 56*  --  54*  CREATININE 3.75* 3.30*  --  3.43*  CALCIUM 9.5 9.7  --  9.9  MG 2.4 2.2 2.1  --   PHOS 4.8* 3.9  --  3.6    Liver Function Tests: Recent Labs  Lab 10/16/20 0500 10/18/20 0529 10/20/20 0737  ALBUMIN 1.5* 1.7* 2.0*   No results for input(s): LIPASE, AMYLASE in the last 168 hours. No results for input(s): AMMONIA in the last 168 hours.  CBC: Recent Labs  Lab 10/16/20 0500 10/17/20 0501 10/18/20 0529 10/20/20 0557  WBC 20.8* 19.1* 20.5* 27.4*  HGB 7.2* 7.2* 7.4* 7.1*  HCT 24.0* 24.6* 25.2* 25.0*  MCV 96.8 98.8 98.1 99.6  PLT 739* 678* 737* 806*    Cardiac Enzymes: No results for input(s): CKTOTAL, CKMB, CKMBINDEX, TROPONINI in the last 168 hours.  BNP: Invalid input(s): POCBNP  CBG: No results for input(s): GLUCAP in the last 168 hours.  Microbiology: Results for orders placed or performed during the hospital encounter of 09/22/20  Culture, respiratory (non-expectorated)     Status: None    Collection Time: 09/23/20 11:25 AM   Specimen: Tracheal Aspirate; Respiratory  Result Value Ref Range Status   Specimen Description TRACHEAL ASPIRATE  Final   Special Requests NONE  Final   Gram Stain   Final    RARE WBC PRESENT,BOTH PMN AND MONONUCLEAR NO ORGANISMS SEEN Performed at [facility], [address] [place], [place] [address]    Culture FEW KLEBSIELLA PNEUMONIAE  Final   Report Status 09/25/2020 FINAL  Final   Organism ID, Bacteria KLEBSIELLA PNEUMONIAE  Final      Susceptibility   Klebsiella pneumoniae - MIC*    AMPICILLIN >=32 RESISTANT Resistant     CEFAZOLIN <=4 SENSITIVE Sensitive     CEFEPIME 0.25 SENSITIVE Sensitive     CEFTAZIDIME <=1 SENSITIVE Sensitive     CEFTRIAXONE 1 SENSITIVE Sensitive     CIPROFLOXACIN <=0.25 SENSITIVE Sensitive     GENTAMICIN <=1 SENSITIVE Sensitive     IMIPENEM <=0.25 SENSITIVE Sensitive     TRIMETH/SULFA <=20 SENSITIVE Sensitive     AMPICILLIN/SULBACTAM 16 INTERMEDIATE Intermediate     PIP/TAZO 64 INTERMEDIATE Intermediate     * FEW KLEBSIELLA PNEUMONIAE  Culture, blood (routine x 2)     Status: None   Collection Time: 09/23/20  4:26 PM   Specimen: BLOOD  Result Value Ref  Range Status   Specimen Description BLOOD BLOOD RIGHT HAND  Final   Special Requests   Final    AEROBIC BOTTLE ONLY Blood Culture results may not be optimal due to an inadequate volume of blood received in culture bottles   Culture   Final    NO GROWTH 5 DAYS Performed at [facility], [place] [address] [place], [place] [address]    Report Status 09/28/2020 FINAL  Final  Culture, blood (single)     Status: None   Collection Time: 09/24/20  6:00 AM   Specimen: BLOOD RIGHT HAND  Result Value Ref Range Status   Specimen Description BLOOD RIGHT HAND  Final   Special Requests   Final    BOTTLES DRAWN AEROBIC ONLY Blood Culture results may not be optimal due to an inadequate volume of blood received in culture bottles   Culture   Final    NO  GROWTH 5 DAYS Performed at [facility], [place] [address], [place], [place] [address]    Report Status 09/29/2020 FINAL  Final  Body fluid culture     Status: None   Collection Time: 09/29/20 12:27 PM   Specimen: PATH Cytology Peritoneal fluid  Result Value Ref Range Status   Specimen Description PERITONEAL FLUID  Final   Special Requests NONE  Final   Gram Stain   Final    WBC PRESENT,BOTH PMN AND MONONUCLEAR NO ORGANISMS SEEN CYTOSPIN SMEAR    Culture   Final    NO GROWTH 3 DAYS Performed at [facility], [address], [place], [place] [address]    Report Status 10/03/2020 FINAL  Final  Culture, blood (Routine X 2) w Reflex to ID Panel     Status: Abnormal   Collection Time: 10/04/20 12:50 PM   Specimen: BLOOD  Result Value Ref Range Status   Specimen Description BLOOD RIGHT ANTECUBITAL  Final   Special Requests   Final    BOTTLES DRAWN AEROBIC AND ANAEROBIC Blood Culture adequate volume   Culture  Setup Time   Final    GRAM POSITIVE COCCI IN CLUSTERS AEROBIC BOTTLE ONLY CRITICAL RESULT CALLED TO, READ BACK BY AND VERIFIED WITH: A. Grandville Silos RN 16:20 10/05/20 ([email or web address]) Performed at [facility], [place] [address], [place], [place] [address]    Culture STAPHYLOCOCCUS EPIDERMIDIS (A)  Final   Report Status 10/07/2020 FINAL  Final   Organism ID, Bacteria STAPHYLOCOCCUS EPIDERMIDIS  Final      Susceptibility   Staphylococcus epidermidis - MIC*    CIPROFLOXACIN >=8 RESISTANT Resistant     ERYTHROMYCIN >=8 RESISTANT Resistant     GENTAMICIN >=16 RESISTANT Resistant     OXACILLIN >=4 RESISTANT Resistant     TETRACYCLINE 2 SENSITIVE Sensitive     VANCOMYCIN 2 SENSITIVE Sensitive     TRIMETH/SULFA 160 RESISTANT Resistant     CLINDAMYCIN >=8 RESISTANT Resistant     RIFAMPIN <=0.5 SENSITIVE Sensitive     Inducible Clindamycin NEGATIVE Sensitive     * STAPHYLOCOCCUS EPIDERMIDIS  Blood Culture ID Panel (Reflexed)     Status: Abnormal   Collection Time: 10/04/20  12:50 PM  Result Value Ref Range Status   Enterococcus faecalis NOT DETECTED NOT DETECTED Final   Enterococcus Faecium NOT DETECTED NOT DETECTED Final   Listeria monocytogenes NOT DETECTED NOT DETECTED Final   Staphylococcus species DETECTED (A) NOT DETECTED Final    Comment: CRITICAL RESULT CALLED TO, READ BACK BY AND VERIFIED WITH: A. Grandville Silos RN  16:20 10/05/20 ([email or web address])    Staphylococcus aureus (BCID) NOT DETECTED NOT DETECTED Final   Staphylococcus epidermidis DETECTED (A) NOT DETECTED Final    Comment: Methicillin (oxacillin) resistant coagulase negative staphylococcus. Possible blood culture contaminant (unless isolated from more than one blood culture draw or clinical case suggests pathogenicity). No antibiotic treatment is indicated for blood  culture contaminants. CRITICAL RESULT CALLED TO, READ BACK BY AND VERIFIED WITH: A. Grandville Silos RN 16:20 10/05/20 ([email or web address])    Staphylococcus lugdunensis NOT DETECTED NOT DETECTED Final   Streptococcus species NOT DETECTED NOT DETECTED Final   Streptococcus agalactiae NOT DETECTED NOT DETECTED Final   Streptococcus pneumoniae NOT DETECTED NOT DETECTED Final   Streptococcus pyogenes NOT DETECTED NOT DETECTED Final   A.calcoaceticus-baumannii NOT DETECTED NOT DETECTED Final   Bacteroides fragilis NOT DETECTED NOT DETECTED Final   Enterobacterales NOT DETECTED NOT DETECTED Final   Enterobacter cloacae complex NOT DETECTED NOT DETECTED Final   Escherichia coli NOT DETECTED NOT DETECTED Final   Klebsiella aerogenes NOT DETECTED NOT DETECTED Final   Klebsiella oxytoca NOT DETECTED NOT DETECTED Final   Klebsiella pneumoniae NOT DETECTED NOT DETECTED Final   Proteus species NOT DETECTED NOT DETECTED Final   Salmonella species NOT DETECTED NOT DETECTED Final   Serratia marcescens NOT DETECTED NOT DETECTED Final   Haemophilus influenzae NOT DETECTED NOT DETECTED Final   Neisseria meningitidis NOT DETECTED NOT DETECTED Final   Pseudomonas aeruginosa  NOT DETECTED NOT DETECTED Final   Stenotrophomonas maltophilia NOT DETECTED NOT DETECTED Final   Candida albicans NOT DETECTED NOT DETECTED Final   Candida auris NOT DETECTED NOT DETECTED Final   Candida glabrata NOT DETECTED NOT DETECTED Final   Candida krusei NOT DETECTED NOT DETECTED Final   Candida parapsilosis NOT DETECTED NOT DETECTED Final   Candida tropicalis NOT DETECTED NOT DETECTED Final   Cryptococcus neoformans/gattii NOT DETECTED NOT DETECTED Final   Methicillin resistance mecA/C DETECTED (A) NOT DETECTED Final    Comment: CRITICAL RESULT CALLED TO, READ BACK BY AND VERIFIED WITH: A. Grandville Silos RN 16:20 10/05/20 ([email or web address]) Performed at [facility], [place] [address], [place], [place] [address]   Culture, blood (Routine X 2) w Reflex to ID Panel     Status: Abnormal   Collection Time: 10/04/20 12:51 PM   Specimen: BLOOD RIGHT HAND  Result Value Ref Range Status   Specimen Description BLOOD RIGHT HAND  Final   Special Requests   Final    BOTTLES DRAWN AEROBIC AND ANAEROBIC Blood Culture adequate volume   Culture  Setup Time   Final    GRAM POSITIVE COCCI AEROBIC BOTTLE ONLY CRITICAL VALUE NOTED.  VALUE IS CONSISTENT WITH PREVIOUSLY REPORTED AND CALLED VALUE.    Culture (A)  Final    STAPHYLOCOCCUS EPIDERMIDIS SUSCEPTIBILITIES PERFORMED ON PREVIOUS CULTURE WITHIN THE LAST 5 DAYS. Performed at [facility], [place] [address], [place],  [address]    Report Status 10/07/2020 FINAL  Final  C Difficile Quick Screen (NO PCR Reflex)     Status: None   Collection Time: 10/04/20  5:19 PM   Specimen: STOOL  Result Value Ref Range Status   C Diff antigen NEGATIVE NEGATIVE Final   C Diff toxin NEGATIVE NEGATIVE Final   C Diff interpretation No C. difficile detected.  Final    Comment: Performed at [facility], [place] [address], [place],  [address]  Culture, respiratory (non-expectorated)     Status: None   Collection Time: 10/05/20 10:14 PM   Specimen:  Tracheal Aspirate; Respiratory  Result Value Ref Range Status   Specimen Description TRACHEAL ASPIRATE  Final   Special Requests NONE  Final   Gram Stain   Final    MODERATE WBC PRESENT, PREDOMINANTLY PMN MODERATE GRAM NEGATIVE RODS FEW YEAST RARE GRAM POSITIVE COCCI IN CHAINS Performed at [facility], [address] [place], [place] [address]    Culture MODERATE KLEBSIELLA PNEUMONIAE  Final   Report Status 10/08/2020 FINAL  Final   Organism ID, Bacteria KLEBSIELLA PNEUMONIAE  Final      Susceptibility   Klebsiella pneumoniae - MIC*    AMPICILLIN RESISTANT Resistant     CEFAZOLIN <=4 SENSITIVE Sensitive     CEFEPIME <=0.12 SENSITIVE Sensitive     CEFTAZIDIME <=1 SENSITIVE Sensitive     CEFTRIAXONE <=0.25 SENSITIVE Sensitive     CIPROFLOXACIN >=4 RESISTANT Resistant     GENTAMICIN <=1 SENSITIVE Sensitive     IMIPENEM <=0.25 SENSITIVE Sensitive     TRIMETH/SULFA <=20 SENSITIVE Sensitive     AMPICILLIN/SULBACTAM 8 SENSITIVE Sensitive     PIP/TAZO 16 SENSITIVE Sensitive     * MODERATE KLEBSIELLA PNEUMONIAE  Culture, blood (routine x 2)     Status: None   Collection Time: 10/08/20  4:17 PM   Specimen: BLOOD RIGHT WRIST  Result Value Ref Range Status   Specimen Description BLOOD RIGHT WRIST  Final   Special Requests   Final    BOTTLES DRAWN AEROBIC ONLY Blood Culture results may not be optimal due to an excessive volume of blood received in culture bottles   Culture   Final    NO GROWTH 5 DAYS Performed at [facility], [place] [address], [place], [place] [address]    Report Status 10/13/2020 FINAL  Final  Culture, blood (routine x 2)     Status: None   Collection Time: 10/08/20  4:17 PM   Specimen: BLOOD RIGHT HAND  Result Value Ref Range Status   Specimen Description BLOOD RIGHT HAND  Final   Special Requests   Final    BOTTLES DRAWN AEROBIC ONLY Blood Culture results may not be optimal due to an excessive volume of blood received in culture bottles   Culture    Final    NO GROWTH 5 DAYS Performed at [facility], [place] [address] [place], [place] [address]    Report Status 10/13/2020 FINAL  Final  Culture, respiratory (non-expectorated)     Status: None   Collection Time: 10/14/20  1:41 PM   Specimen: Tracheal Aspirate; Respiratory  Result Value Ref Range Status   Specimen Description TRACHEAL ASPIRATE  Final   Special Requests NONE  Final   Gram Stain   Final    RARE WBC PRESENT, PREDOMINANTLY PMN RARE GRAM NEGATIVE RODS    Culture   Final    RARE Normal respiratory flora-no Staph aureus or Pseudomonas seen Performed at [facility], [address], [place], [place] [address]    Report Status 10/17/2020 FINAL  Final    Coagulation Studies: No results for input(s): LABPROT, INR in the last 72 hours.  Urinalysis: No results for input(s): COLORURINE, LABSPEC, PHURINE, GLUCOSEU, HGBUR, BILIRUBINUR, KETONESUR, PROTEINUR, UROBILINOGEN, NITRITE, LEUKOCYTESUR in the last 72 hours.  Invalid input(s): APPERANCEUR    Imaging: No results found.   Medications:     iohexol, lidocaine  Assessment/ Plan:  54 y.o. 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   1.  ESRD on HD.  Patient seen and evaluated during dialysis treatment.  Ultrafiltration target reduced to 1.5 kg.  We plan to complete dialysis treatment today and next dialysis treatment will be on Monday.  2.  Hypotension.  Maintain the patient on albumin and midodrine support.  3.  Anemia of chronic kidney disease.   Lab Results   Component Value Date   HGB 7.1 (L) 10/20/2020  Hemoglobin down to 7.1.  Consider blood transfusion but defer to primary team.  4.  Secondary hyperparathyroidism.  Phosphorus currently 3.6 and acceptable.  5.  Acute respiratory failure.  Patient now decannulated.  Has made excellent progress.    LOS: 0 Santana Gosdin 10/29/20212:39 PM

## 2020-10-20 NOTE — Progress Notes (Signed)
Pulmonary Critical Care Medicine [place]   PULMONARY CRITICAL CARE SERVICE  PROGRESS NOTE  Date of Service: 10/20/2020  Jeremy Yu  LKT:[id number]  DOB: 1966/03/08   DOA: 09/22/2020  Referring Physician: Merton Border, MD  HPI: Jeremy Yu is a 54 y.o. male seen for follow up of Acute on Chronic Respiratory Failure.  Patient is capping has been capping for 4 days ready for decannulation  Medications: Reviewed on Rounds  Physical Exam:  Vitals: Temperature is 97.9 pulse 94 respiratory rate is 21 blood pressure is 108/63 saturations 97%  Ventilator Settings capping off the ventilator  . General: Comfortable at this time . Eyes: Grossly normal lids, irises & conjunctiva . ENT: grossly tongue is normal . Neck: no obvious mass . Cardiovascular: S1 S2 normal no gallop . Respiratory: No rhonchi very coarse breath sounds . Abdomen: soft . Skin: no rash seen on limited exam . Musculoskeletal: not rigid . Psychiatric:unable to assess . Neurologic: no seizure no involuntary movements         Lab Data:   Basic Metabolic Panel: Recent Labs  Lab 10/16/20 0500 10/18/20 0529 10/20/20 0557 10/20/20 0737  NA 130* 131*  --  132*  K 4.4 4.1  --  3.8  CL 99 97*  --  95*  CO2 22 22  --  25  GLUCOSE 86 77  --  102*  BUN 70* 56*  --  54*  CREATININE 3.75* 3.30*  --  3.43*  CALCIUM 9.5 9.7  --  9.9  MG 2.4 2.2 2.1  --   PHOS 4.8* 3.9  --  3.6    ABG: No results for input(s): PHART, PCO2ART, PO2ART, HCO3, O2SAT in the last 168 hours.  Liver Function Tests: Recent Labs  Lab 10/16/20 0500 10/18/20 0529 10/20/20 0737  ALBUMIN 1.5* 1.7* 2.0*   No results for input(s): LIPASE, AMYLASE in the last 168 hours. No results for input(s): AMMONIA in the last 168 hours.  CBC: Recent Labs  Lab 10/16/20 0500 10/17/20 0501 10/18/20 0529 10/20/20 0557  WBC 20.8* 19.1* 20.5* 27.4*  HGB 7.2* 7.2* 7.4* 7.1*  HCT 24.0* 24.6* 25.2* 25.0*  MCV 96.8 98.8  98.1 99.6  PLT 739* 678* 737* 806*    Cardiac Enzymes: No results for input(s): CKTOTAL, CKMB, CKMBINDEX, TROPONINI in the last 168 hours.  BNP (last 3 results) No results for input(s): BNP in the last 8760 hours.  ProBNP (last 3 results) No results for input(s): PROBNP in the last 8760 hours.  Radiological Exams: No results found.  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 Acute on chronic respiratory failure with hypoxia we will proceed to decannulate 2. End-stage renal failure on hemodialysis continue with supportive care 3. Severe sepsis with shock resolved 4. Cardiac arrest rhythm stable 5. Metabolic encephalopathy improved   I have personally seen and evaluated the patient, evaluated laboratory and imaging results, formulated the assessment and plan and placed orders. The Patient requires high complexity decision making with multiple systems involvement.  Rounds were done with the Respiratory Therapy Director and Staff therapists and discussed with nursing staff also.  Allyne Gee, MD [facility] Pulmonary Critical Care Medicine Sleep Medicine

## 2020-10-21 LAB — CBC
HCT: 22.5 % — ABNORMAL LOW (ref 39.0–52.0)
Hemoglobin: 6.4 g/dL — CL (ref 13.0–17.0)
MCH: 28.4 pg (ref 26.0–34.0)
MCHC: 28.4 g/dL — ABNORMAL LOW (ref 30.0–36.0)
MCV: 100 fL (ref 80.0–100.0)
Platelets: 725 10*3/uL — ABNORMAL HIGH (ref 150–400)
RBC: 2.25 MIL/uL — ABNORMAL LOW (ref 4.22–5.81)
RDW: 20.9 % — ABNORMAL HIGH (ref 11.5–15.5)
WBC: 24 10*3/uL — ABNORMAL HIGH (ref 4.0–10.5)
nRBC: 0.5 % — ABNORMAL HIGH (ref 0.0–0.2)

## 2020-10-21 LAB — PREPARE RBC (CROSSMATCH)

## 2020-10-22 LAB — BPAM RBC
Blood Product Expiration Date: 202111062359
ISSUE DATE / TIME: 202110301425
Unit Type and Rh: 5100

## 2020-10-22 LAB — CBC
HCT: 25.8 % — ABNORMAL LOW (ref 39.0–52.0)
Hemoglobin: 7.5 g/dL — ABNORMAL LOW (ref 13.0–17.0)
MCH: 28.4 pg (ref 26.0–34.0)
MCHC: 29.1 g/dL — ABNORMAL LOW (ref 30.0–36.0)
MCV: 97.7 fL (ref 80.0–100.0)
Platelets: 704 10*3/uL — ABNORMAL HIGH (ref 150–400)
RBC: 2.64 MIL/uL — ABNORMAL LOW (ref 4.22–5.81)
RDW: 19.9 % — ABNORMAL HIGH (ref 11.5–15.5)
WBC: 20.9 10*3/uL — ABNORMAL HIGH (ref 4.0–10.5)
nRBC: 0.8 % — ABNORMAL HIGH (ref 0.0–0.2)

## 2020-10-22 LAB — TYPE AND SCREEN
ABO/RH(D): O POS
Antibody Screen: NEGATIVE
Unit division: 0

## 2020-10-23 LAB — CBC
HCT: 28.7 % — ABNORMAL LOW (ref 39.0–52.0)
Hemoglobin: 8.2 g/dL — ABNORMAL LOW (ref 13.0–17.0)
MCH: 27.9 pg (ref 26.0–34.0)
MCHC: 28.6 g/dL — ABNORMAL LOW (ref 30.0–36.0)
MCV: 97.6 fL (ref 80.0–100.0)
Platelets: 776 10*3/uL — ABNORMAL HIGH (ref 150–400)
RBC: 2.94 MIL/uL — ABNORMAL LOW (ref 4.22–5.81)
RDW: 20 % — ABNORMAL HIGH (ref 11.5–15.5)
WBC: 20.4 10*3/uL — ABNORMAL HIGH (ref 4.0–10.5)
nRBC: 0.8 % — ABNORMAL HIGH (ref 0.0–0.2)

## 2020-10-23 LAB — RENAL FUNCTION PANEL
Albumin: 1.9 g/dL — ABNORMAL LOW (ref 3.5–5.0)
Anion gap: 11 (ref 5–15)
BUN: 69 mg/dL — ABNORMAL HIGH (ref 6–20)
CO2: 23 mmol/L (ref 22–32)
Calcium: 10.3 mg/dL (ref 8.9–10.3)
Chloride: 102 mmol/L (ref 98–111)
Creatinine, Ser: 4.14 mg/dL — ABNORMAL HIGH (ref 0.61–1.24)
GFR, Estimated: 16 mL/min — ABNORMAL LOW (ref >60)
Glucose, Bld: 105 mg/dL — ABNORMAL HIGH (ref 70–99)
Phosphorus: 3.7 mg/dL (ref 2.5–4.6)
Potassium: 3.1 mmol/L — ABNORMAL LOW (ref 3.5–5.1)
Sodium: 136 mmol/L (ref 135–145)

## 2020-10-23 LAB — MAGNESIUM: Magnesium: 2.2 mg/dL (ref 1.7–2.4)

## 2020-10-23 LAB — POTASSIUM: Potassium: 3.4 mmol/L — ABNORMAL LOW (ref 3.5–5.1)

## 2020-10-23 NOTE — Progress Notes (Signed)
[facility]  ROUNDING NOTE   Subjective:  Patient seen during dialysis treatment this AM. Tolerating the treatment well at the moment. Ultrafiltration target 2 kg.  Objective:  Vital signs in last 24 hours:  Temperature 96.3 pulse 90 respirations 20 blood pressure 141/80   Physical Exam: General:  Chronically ill-appearing  Head:  Normocephalic, atraumatic. Moist oral mucosal membranes  Eyes:  Anicteric  Neck:  Now decannulated  Lungs:   Coarse breath sounds bilateral, normal effort  Heart:  S1S2 no rubs  Abdomen:   Soft, nontender, bowel sounds present, PEG in place, colostomy  Extremities:  Right AKA  Neurologic:  Awake, alert, follows commands  Skin:  No lesions  Access:  Left upper extremity AV fistula    Basic Metabolic Panel: Recent Labs  Lab 10/18/20 0529 10/20/20 0557 10/20/20 0737 10/23/20 0605  NA 131*  --  132* 136  K 4.1  --  3.8 3.1*  CL 97*  --  95* 102  CO2 22  --  25 23  GLUCOSE 77  --  102* 105*  BUN 56*  --  54* 69*  CREATININE 3.30*  --  3.43* 4.14*  CALCIUM 9.7  --  9.9 10.3  MG 2.2 2.1  --  2.2  PHOS 3.9  --  3.6 3.7    Liver Function Tests: Recent Labs  Lab 10/18/20 0529 10/20/20 0737 10/23/20 0605  ALBUMIN 1.7* 2.0* 1.9*   No results for input(s): LIPASE, AMYLASE in the last 168 hours. No results for input(s): AMMONIA in the last 168 hours.  CBC: Recent Labs  Lab 10/18/20 0529 10/20/20 0557 10/21/20 0700 10/22/20 0457 10/23/20 0605  WBC 20.5* 27.4* 24.0* 20.9* 20.4*  HGB 7.4* 7.1* 6.4* 7.5* 8.2*  HCT 25.2* 25.0* 22.5* 25.8* 28.7*  MCV 98.1 99.6 100.0 97.7 97.6  PLT 737* 806* 725* 704* 776*    Cardiac Enzymes: No results for input(s): CKTOTAL, CKMB, CKMBINDEX, TROPONINI in the last 168 hours.  BNP: Invalid input(s): POCBNP  CBG: No results for input(s): GLUCAP in the last 168 hours.  Microbiology: Results for orders placed or performed during the hospital encounter of 09/22/20  Culture, respiratory  (non-expectorated)     Status: None   Collection Time: 09/23/20 11:25 AM   Specimen: Tracheal Aspirate; Respiratory  Result Value Ref Range Status   Specimen Description TRACHEAL ASPIRATE  Final   Special Requests NONE  Final   Gram Stain   Final    RARE WBC PRESENT,BOTH PMN AND MONONUCLEAR NO ORGANISMS SEEN Performed at [facility], [address] [place],  [address]    Culture FEW KLEBSIELLA PNEUMONIAE  Final   Report Status 09/25/2020 FINAL  Final   Organism ID, Bacteria KLEBSIELLA PNEUMONIAE  Final      Susceptibility   Klebsiella pneumoniae - MIC*    AMPICILLIN >=32 RESISTANT Resistant     CEFAZOLIN <=4 SENSITIVE Sensitive     CEFEPIME 0.25 SENSITIVE Sensitive     CEFTAZIDIME <=1 SENSITIVE Sensitive     CEFTRIAXONE 1 SENSITIVE Sensitive     CIPROFLOXACIN <=0.25 SENSITIVE Sensitive     GENTAMICIN <=1 SENSITIVE Sensitive     IMIPENEM <=0.25 SENSITIVE Sensitive     TRIMETH/SULFA <=20 SENSITIVE Sensitive     AMPICILLIN/SULBACTAM 16 INTERMEDIATE Intermediate     PIP/TAZO 64 INTERMEDIATE Intermediate     * FEW KLEBSIELLA PNEUMONIAE  Culture, blood (routine x 2)     Status: None   Collection Time: 09/23/20  4:26 PM  Specimen: BLOOD  Result Value Ref Range Status   Specimen Description BLOOD BLOOD RIGHT HAND  Final   Special Requests   Final    AEROBIC BOTTLE ONLY Blood Culture results may not be optimal due to an inadequate volume of blood received in culture bottles   Culture   Final    NO GROWTH 5 DAYS Performed at [facility], [place] [address], [place], [place] [address]    Report Status 09/28/2020 FINAL  Final  Culture, blood (single)     Status: None   Collection Time: 09/24/20  6:00 AM   Specimen: BLOOD RIGHT HAND  Result Value Ref Range Status   Specimen Description BLOOD RIGHT HAND  Final   Special Requests   Final    BOTTLES DRAWN AEROBIC ONLY Blood Culture results may not be optimal due to an inadequate volume of blood received in culture  bottles   Culture   Final    NO GROWTH 5 DAYS Performed at [facility], [place] [address], [place], [place] [address]    Report Status 09/29/2020 FINAL  Final  Body fluid culture     Status: None   Collection Time: 09/29/20 12:27 PM   Specimen: PATH Cytology Peritoneal fluid  Result Value Ref Range Status   Specimen Description PERITONEAL FLUID  Final   Special Requests NONE  Final   Gram Stain   Final    WBC PRESENT,BOTH PMN AND MONONUCLEAR NO ORGANISMS SEEN CYTOSPIN SMEAR    Culture   Final    NO GROWTH 3 DAYS Performed at [facility], [address] [place], [place] [address]    Report Status 10/03/2020 FINAL  Final  Culture, blood (Routine X 2) w Reflex to ID Panel     Status: Abnormal   Collection Time: 10/04/20 12:50 PM   Specimen: BLOOD  Result Value Ref Range Status   Specimen Description BLOOD RIGHT ANTECUBITAL  Final   Special Requests   Final    BOTTLES DRAWN AEROBIC AND ANAEROBIC Blood Culture adequate volume   Culture  Setup Time   Final    GRAM POSITIVE COCCI IN CLUSTERS AEROBIC BOTTLE ONLY CRITICAL RESULT CALLED TO, READ BACK BY AND VERIFIED WITH: A. [place] Silos RN 16:20 10/05/20 ([email or web address]) Performed at [facility], [place] [address], [place], [place] [address]    Culture STAPHYLOCOCCUS EPIDERMIDIS (A)  Final   Report Status 10/07/2020 FINAL  Final   Organism ID, Bacteria STAPHYLOCOCCUS EPIDERMIDIS  Final      Susceptibility   Staphylococcus epidermidis - MIC*    CIPROFLOXACIN >=8 RESISTANT Resistant     ERYTHROMYCIN >=8 RESISTANT Resistant     GENTAMICIN >=16 RESISTANT Resistant     OXACILLIN >=4 RESISTANT Resistant     TETRACYCLINE 2 SENSITIVE Sensitive     VANCOMYCIN 2 SENSITIVE Sensitive     TRIMETH/SULFA 160 RESISTANT Resistant     CLINDAMYCIN >=8 RESISTANT Resistant     RIFAMPIN <=0.5 SENSITIVE Sensitive     Inducible Clindamycin NEGATIVE Sensitive     * STAPHYLOCOCCUS EPIDERMIDIS  Blood Culture ID Panel (Reflexed)     Status:  Abnormal   Collection Time: 10/04/20 12:50 PM  Result Value Ref Range Status   Enterococcus faecalis NOT DETECTED NOT DETECTED Final   Enterococcus Faecium NOT DETECTED NOT DETECTED Final   Listeria monocytogenes NOT DETECTED NOT DETECTED Final   Staphylococcus species DETECTED (A) NOT DETECTED Final    Comment: CRITICAL RESULT CALLED TO, READ BACK BY  AND VERIFIED WITH: A. [place] Silos RN 16:20 10/05/20 ([email or web address])    Staphylococcus aureus (BCID) NOT DETECTED NOT DETECTED Final   Staphylococcus epidermidis DETECTED (A) NOT DETECTED Final    Comment: Methicillin (oxacillin) resistant coagulase negative staphylococcus. Possible blood culture contaminant (unless isolated from more than one blood culture draw or clinical case suggests pathogenicity). No antibiotic treatment is indicated for blood  culture contaminants. CRITICAL RESULT CALLED TO, READ BACK BY AND VERIFIED WITH: A. [place] Silos RN 16:20 10/05/20 ([email or web address])    Staphylococcus lugdunensis NOT DETECTED NOT DETECTED Final   Streptococcus species NOT DETECTED NOT DETECTED Final   Streptococcus agalactiae NOT DETECTED NOT DETECTED Final   Streptococcus pneumoniae NOT DETECTED NOT DETECTED Final   Streptococcus pyogenes NOT DETECTED NOT DETECTED Final   A.calcoaceticus-baumannii NOT DETECTED NOT DETECTED Final   Bacteroides fragilis NOT DETECTED NOT DETECTED Final   Enterobacterales NOT DETECTED NOT DETECTED Final   Enterobacter cloacae complex NOT DETECTED NOT DETECTED Final   Escherichia coli NOT DETECTED NOT DETECTED Final   Klebsiella aerogenes NOT DETECTED NOT DETECTED Final   Klebsiella oxytoca NOT DETECTED NOT DETECTED Final   Klebsiella pneumoniae NOT DETECTED NOT DETECTED Final   Proteus species NOT DETECTED NOT DETECTED Final   Salmonella species NOT DETECTED NOT DETECTED Final   Serratia marcescens NOT DETECTED NOT DETECTED Final   Haemophilus influenzae NOT DETECTED NOT DETECTED Final   Neisseria meningitidis NOT DETECTED NOT  DETECTED Final   Pseudomonas aeruginosa NOT DETECTED NOT DETECTED Final   Stenotrophomonas maltophilia NOT DETECTED NOT DETECTED Final   Candida albicans NOT DETECTED NOT DETECTED Final   Candida auris NOT DETECTED NOT DETECTED Final   Candida glabrata NOT DETECTED NOT DETECTED Final   Candida krusei NOT DETECTED NOT DETECTED Final   Candida parapsilosis NOT DETECTED NOT DETECTED Final   Candida tropicalis NOT DETECTED NOT DETECTED Final   Cryptococcus neoformans/gattii NOT DETECTED NOT DETECTED Final   Methicillin resistance mecA/C DETECTED (A) NOT DETECTED Final    Comment: CRITICAL RESULT CALLED TO, READ BACK BY AND VERIFIED WITH: A. [place] Silos RN 16:20 10/05/20 ([email or web address]) Performed at [facility], [place] [address] [place], [place] [address]   Culture, blood (Routine X 2) w Reflex to ID Panel     Status: Abnormal   Collection Time: 10/04/20 12:51 PM   Specimen: BLOOD RIGHT HAND  Result Value Ref Range Status   Specimen Description BLOOD RIGHT HAND  Final   Special Requests   Final    BOTTLES DRAWN AEROBIC AND ANAEROBIC Blood Culture adequate volume   Culture  Setup Time   Final    GRAM POSITIVE COCCI AEROBIC BOTTLE ONLY CRITICAL VALUE NOTED.  VALUE IS CONSISTENT WITH PREVIOUSLY REPORTED AND CALLED VALUE.    Culture (A)  Final    STAPHYLOCOCCUS EPIDERMIDIS SUSCEPTIBILITIES PERFORMED ON PREVIOUS CULTURE WITHIN THE LAST 5 DAYS. Performed at [facility], [place] [address], [place], [place] [address]    Report Status 10/07/2020 FINAL  Final  C Difficile Quick Screen (NO PCR Reflex)     Status: None   Collection Time: 10/04/20  5:19 PM   Specimen: STOOL  Result Value Ref Range Status   C Diff antigen NEGATIVE NEGATIVE Final   C Diff toxin NEGATIVE NEGATIVE Final   C Diff interpretation No C. difficile detected.  Final    Comment: Performed at [facility], [place] [address], [place],  [address]  Culture, respiratory (non-expectorated)     Status: None    Collection  Time: 10/05/20 10:14 PM   Specimen: Tracheal Aspirate; Respiratory  Result Value Ref Range Status   Specimen Description TRACHEAL ASPIRATE  Final   Special Requests NONE  Final   Gram Stain   Final    MODERATE WBC PRESENT, PREDOMINANTLY PMN MODERATE GRAM NEGATIVE RODS FEW YEAST RARE GRAM POSITIVE COCCI IN CHAINS Performed at [facility], [address], [place], [place] [address]    Culture MODERATE KLEBSIELLA PNEUMONIAE  Final   Report Status 10/08/2020 FINAL  Final   Organism ID, Bacteria KLEBSIELLA PNEUMONIAE  Final      Susceptibility   Klebsiella pneumoniae - MIC*    AMPICILLIN RESISTANT Resistant     CEFAZOLIN <=4 SENSITIVE Sensitive     CEFEPIME <=0.12 SENSITIVE Sensitive     CEFTAZIDIME <=1 SENSITIVE Sensitive     CEFTRIAXONE <=0.25 SENSITIVE Sensitive     CIPROFLOXACIN >=4 RESISTANT Resistant     GENTAMICIN <=1 SENSITIVE Sensitive     IMIPENEM <=0.25 SENSITIVE Sensitive     TRIMETH/SULFA <=20 SENSITIVE Sensitive     AMPICILLIN/SULBACTAM 8 SENSITIVE Sensitive     PIP/TAZO 16 SENSITIVE Sensitive     * MODERATE KLEBSIELLA PNEUMONIAE  Culture, blood (routine x 2)     Status: None   Collection Time: 10/08/20  4:17 PM   Specimen: BLOOD RIGHT WRIST  Result Value Ref Range Status   Specimen Description BLOOD RIGHT WRIST  Final   Special Requests   Final    BOTTLES DRAWN AEROBIC ONLY Blood Culture results may not be optimal due to an excessive volume of blood received in culture bottles   Culture   Final    NO GROWTH 5 DAYS Performed at [facility], [place] [address], [place], [place] [address]    Report Status 10/13/2020 FINAL  Final  Culture, blood (routine x 2)     Status: None   Collection Time: 10/08/20  4:17 PM   Specimen: BLOOD RIGHT HAND  Result Value Ref Range Status   Specimen Description BLOOD RIGHT HAND  Final   Special Requests   Final    BOTTLES DRAWN AEROBIC ONLY Blood Culture results may not be optimal due to an excessive volume of  blood received in culture bottles   Culture   Final    NO GROWTH 5 DAYS Performed at [facility], [place] [address] [place], [place] [address]    Report Status 10/13/2020 FINAL  Final  Culture, respiratory (non-expectorated)     Status: None   Collection Time: 10/14/20  1:41 PM   Specimen: Tracheal Aspirate; Respiratory  Result Value Ref Range Status   Specimen Description TRACHEAL ASPIRATE  Final   Special Requests NONE  Final   Gram Stain   Final    RARE WBC PRESENT, PREDOMINANTLY PMN RARE GRAM NEGATIVE RODS    Culture   Final    RARE Normal respiratory flora-no Staph aureus or Pseudomonas seen Performed at [facility], [address], [place], [place] [address]    Report Status 10/17/2020 FINAL  Final    Coagulation Studies: No results for input(s): LABPROT, INR in the last 72 hours.  Urinalysis: No results for input(s): COLORURINE, LABSPEC, PHURINE, GLUCOSEU, HGBUR, BILIRUBINUR, KETONESUR, PROTEINUR, UROBILINOGEN, NITRITE, LEUKOCYTESUR in the last 72 hours.  Invalid input(s): APPERANCEUR    Imaging: No results found.   Medications:     iohexol, lidocaine  Assessment/ Plan:  54 y.o. male  with a PMHx of ESRD on HD, C. difficile  colitis with subsequent colonic resection and colostomy placement, severe protein calorie malnutrition, diabetes mellitus type 2, anemia of chronic kidney disease, combined systolic and diastolic heart failure, hyperlipidemia, fatty liver disease, sacral decubitus ulcer, anemia of chronic kidney disease, secondary hyperparathyroidism, right above-the-knee amputation, who was admitted to [facility] on 09/22/2020 for ongoing care.   1.  ESRD on HD.  Patient currently undergoing dialysis treatment this AM.  Tolerating well.  Ultrafiltration target 2 kg.  2.  Hypotension.  Patient has albumin and midodrine available for blood pressure support.  3.  Anemia of chronic kidney disease.   Lab Results  Component Value Date   HGB  8.2 (L) 10/23/2020  Hemoglobin up to 8.2 posttransfusion.  Continue to monitor.  4.  Secondary hyperparathyroidism.  Phosphorus at target at 3.7.  Continue to periodically monitor.  5.  Acute respiratory failure.  Continues to do well from a respiratory perspective as he remains decannulated at this time.    LOS: 0 Yessica Putnam 11/1/20217:48 AM

## 2020-10-24 LAB — POTASSIUM: Potassium: 3.4 mmol/L — ABNORMAL LOW (ref 3.5–5.1)

## 2020-10-25 LAB — RENAL FUNCTION PANEL
Albumin: 2 g/dL — ABNORMAL LOW (ref 3.5–5.0)
Anion gap: 10 (ref 5–15)
BUN: 55 mg/dL — ABNORMAL HIGH (ref 6–20)
CO2: 26 mmol/L (ref 22–32)
Calcium: 10.2 mg/dL (ref 8.9–10.3)
Chloride: 99 mmol/L (ref 98–111)
Creatinine, Ser: 3.36 mg/dL — ABNORMAL HIGH (ref 0.61–1.24)
GFR, Estimated: 21 mL/min — ABNORMAL LOW (ref >60)
Glucose, Bld: 135 mg/dL — ABNORMAL HIGH (ref 70–99)
Phosphorus: 2.5 mg/dL (ref 2.5–4.6)
Potassium: 4 mmol/L (ref 3.5–5.1)
Sodium: 135 mmol/L (ref 135–145)

## 2020-10-25 LAB — CBC
HCT: 26.4 % — ABNORMAL LOW (ref 39.0–52.0)
Hemoglobin: 7.7 g/dL — ABNORMAL LOW (ref 13.0–17.0)
MCH: 28.4 pg (ref 26.0–34.0)
MCHC: 29.2 g/dL — ABNORMAL LOW (ref 30.0–36.0)
MCV: 97.4 fL (ref 80.0–100.0)
Platelets: 783 10*3/uL — ABNORMAL HIGH (ref 150–400)
RBC: 2.71 MIL/uL — ABNORMAL LOW (ref 4.22–5.81)
RDW: 20 % — ABNORMAL HIGH (ref 11.5–15.5)
WBC: 19.2 10*3/uL — ABNORMAL HIGH (ref 4.0–10.5)
nRBC: 0.4 % — ABNORMAL HIGH (ref 0.0–0.2)

## 2020-10-25 LAB — MAGNESIUM: Magnesium: 2.2 mg/dL (ref 1.7–2.4)

## 2020-10-25 NOTE — Progress Notes (Signed)
[facility]  ROUNDING NOTE   Subjective:  Patient seen during dialysis treatment this AM. Sitting up in a chair at the moment.  Objective:  Vital signs in last 24 hours:  Temperature 98.2 pulse 101 respirations 30 blood pressure 122/72   Physical Exam: General:  Chronically ill-appearing  Head:  Normocephalic, atraumatic. Moist oral mucosal membranes  Eyes:  Anicteric  Neck:  Supple  Lungs:   Scattered rhonchi, normal effort  Heart:  S1S2 no rubs  Abdomen:   Soft, nontender, bowel sounds present, PEG in place, colostomy  Extremities:  Right AKA  Neurologic:  Awake, alert, follows commands  Skin:  No lesions  Access:  Left upper extremity AV fistula    Basic Metabolic Panel: Recent Labs  Lab 10/20/20 0557 10/20/20 0737 10/23/20 0605 10/23/20 2205 10/24/20 0409 10/25/20 0438  NA  --  132* 136  --   --  135  K  --  3.8 3.1* 3.4* 3.4* 4.0  CL  --  95* 102  --   --  99  CO2  --  25 23  --   --  26  GLUCOSE  --  102* 105*  --   --  135*  BUN  --  54* 69*  --   --  55*  CREATININE  --  3.43* 4.14*  --   --  3.36*  CALCIUM  --  9.9 10.3  --   --  10.2  MG 2.1  --  2.2  --   --  2.2  PHOS  --  3.6 3.7  --   --  2.5    Liver Function Tests: Recent Labs  Lab 10/20/20 0737 10/23/20 0605 10/25/20 0438  ALBUMIN 2.0* 1.9* 2.0*   No results for input(s): LIPASE, AMYLASE in the last 168 hours. No results for input(s): AMMONIA in the last 168 hours.  CBC: Recent Labs  Lab 10/20/20 0557 10/21/20 0700 10/22/20 0457 10/23/20 0605 10/25/20 0438  WBC 27.4* 24.0* 20.9* 20.4* 19.2*  HGB 7.1* 6.4* 7.5* 8.2* 7.7*  HCT 25.0* 22.5* 25.8* 28.7* 26.4*  MCV 99.6 100.0 97.7 97.6 97.4  PLT 806* 725* 704* 776* 783*    Cardiac Enzymes: No results for input(s): CKTOTAL, CKMB, CKMBINDEX, TROPONINI in the last 168 hours.  BNP: Invalid input(s): POCBNP  CBG: No results for input(s): GLUCAP in the last 168 hours.  Microbiology: Results for orders placed or  performed during the hospital encounter of 09/22/20  Culture, respiratory (non-expectorated)     Status: None   Collection Time: 09/23/20 11:25 AM   Specimen: Tracheal Aspirate; Respiratory  Result Value Ref Range Status   Specimen Description TRACHEAL ASPIRATE  Final   Special Requests NONE  Final   Gram Stain   Final    RARE WBC PRESENT,BOTH PMN AND MONONUCLEAR NO ORGANISMS SEEN Performed at [facility], [address] [place], [place] [address]    Culture FEW KLEBSIELLA PNEUMONIAE  Final   Report Status 09/25/2020 FINAL  Final   Organism ID, Bacteria KLEBSIELLA PNEUMONIAE  Final      Susceptibility   Klebsiella pneumoniae - MIC*    AMPICILLIN >=32 RESISTANT Resistant     CEFAZOLIN <=4 SENSITIVE Sensitive     CEFEPIME 0.25 SENSITIVE Sensitive     CEFTAZIDIME <=1 SENSITIVE Sensitive     CEFTRIAXONE 1 SENSITIVE Sensitive     CIPROFLOXACIN <=0.25 SENSITIVE Sensitive     GENTAMICIN <=1 SENSITIVE Sensitive     IMIPENEM <=0.25 SENSITIVE  Sensitive     TRIMETH/SULFA <=20 SENSITIVE Sensitive     AMPICILLIN/SULBACTAM 16 INTERMEDIATE Intermediate     PIP/TAZO 64 INTERMEDIATE Intermediate     * FEW KLEBSIELLA PNEUMONIAE  Culture, blood (routine x 2)     Status: None   Collection Time: 09/23/20  4:26 PM   Specimen: BLOOD  Result Value Ref Range Status   Specimen Description BLOOD BLOOD RIGHT HAND  Final   Special Requests   Final    AEROBIC BOTTLE ONLY Blood Culture results may not be optimal due to an inadequate volume of blood received in culture bottles   Culture   Final    NO GROWTH 5 DAYS Performed at [facility], [place] [address] [place], [place] [address]    Report Status 09/28/2020 FINAL  Final  Culture, blood (single)     Status: None   Collection Time: 09/24/20  6:00 AM   Specimen: BLOOD RIGHT HAND  Result Value Ref Range Status   Specimen Description BLOOD RIGHT HAND  Final   Special Requests   Final    BOTTLES DRAWN AEROBIC ONLY Blood Culture results may  not be optimal due to an inadequate volume of blood received in culture bottles   Culture   Final    NO GROWTH 5 DAYS Performed at [facility], [place] [address], [place], [place] [address]    Report Status 09/29/2020 FINAL  Final  Body fluid culture     Status: None   Collection Time: 09/29/20 12:27 PM   Specimen: PATH Cytology Peritoneal fluid  Result Value Ref Range Status   Specimen Description PERITONEAL FLUID  Final   Special Requests NONE  Final   Gram Stain   Final    WBC PRESENT,BOTH PMN AND MONONUCLEAR NO ORGANISMS SEEN CYTOSPIN SMEAR    Culture   Final    NO GROWTH 3 DAYS Performed at [facility], [address], [place],  [address]    Report Status 10/03/2020 FINAL  Final  Culture, blood (Routine X 2) w Reflex to ID Panel     Status: Abnormal   Collection Time: 10/04/20 12:50 PM   Specimen: BLOOD  Result Value Ref Range Status   Specimen Description BLOOD RIGHT ANTECUBITAL  Final   Special Requests   Final    BOTTLES DRAWN AEROBIC AND ANAEROBIC Blood Culture adequate volume   Culture  Setup Time   Final    GRAM POSITIVE COCCI IN CLUSTERS AEROBIC BOTTLE ONLY CRITICAL RESULT CALLED TO, READ BACK BY AND VERIFIED WITH: A. Grandville Silos RN 16:20 10/05/20 ([email or web address]) Performed at [facility], [place] [address], [place], [place] [address]    Culture STAPHYLOCOCCUS EPIDERMIDIS (A)  Final   Report Status 10/07/2020 FINAL  Final   Organism ID, Bacteria STAPHYLOCOCCUS EPIDERMIDIS  Final      Susceptibility   Staphylococcus epidermidis - MIC*    CIPROFLOXACIN >=8 RESISTANT Resistant     ERYTHROMYCIN >=8 RESISTANT Resistant     GENTAMICIN >=16 RESISTANT Resistant     OXACILLIN >=4 RESISTANT Resistant     TETRACYCLINE 2 SENSITIVE Sensitive     VANCOMYCIN 2 SENSITIVE Sensitive     TRIMETH/SULFA 160 RESISTANT Resistant     CLINDAMYCIN >=8 RESISTANT Resistant     RIFAMPIN <=0.5 SENSITIVE Sensitive     Inducible Clindamycin NEGATIVE Sensitive     *  STAPHYLOCOCCUS EPIDERMIDIS  Blood Culture ID Panel (Reflexed)     Status: Abnormal   Collection Time: 10/04/20  12:50 PM  Result Value Ref Range Status   Enterococcus faecalis NOT DETECTED NOT DETECTED Final   Enterococcus Faecium NOT DETECTED NOT DETECTED Final   Listeria monocytogenes NOT DETECTED NOT DETECTED Final   Staphylococcus species DETECTED (A) NOT DETECTED Final    Comment: CRITICAL RESULT CALLED TO, READ BACK BY AND VERIFIED WITH: A. Grandville Silos RN 16:20 10/05/20 ([email or web address])    Staphylococcus aureus (BCID) NOT DETECTED NOT DETECTED Final   Staphylococcus epidermidis DETECTED (A) NOT DETECTED Final    Comment: Methicillin (oxacillin) resistant coagulase negative staphylococcus. Possible blood culture contaminant (unless isolated from more than one blood culture draw or clinical case suggests pathogenicity). No antibiotic treatment is indicated for blood  culture contaminants. CRITICAL RESULT CALLED TO, READ BACK BY AND VERIFIED WITH: A. Grandville Silos RN 16:20 10/05/20 ([email or web address])    Staphylococcus lugdunensis NOT DETECTED NOT DETECTED Final   Streptococcus species NOT DETECTED NOT DETECTED Final   Streptococcus agalactiae NOT DETECTED NOT DETECTED Final   Streptococcus pneumoniae NOT DETECTED NOT DETECTED Final   Streptococcus pyogenes NOT DETECTED NOT DETECTED Final   A.calcoaceticus-baumannii NOT DETECTED NOT DETECTED Final   Bacteroides fragilis NOT DETECTED NOT DETECTED Final   Enterobacterales NOT DETECTED NOT DETECTED Final   Enterobacter cloacae complex NOT DETECTED NOT DETECTED Final   Escherichia coli NOT DETECTED NOT DETECTED Final   Klebsiella aerogenes NOT DETECTED NOT DETECTED Final   Klebsiella oxytoca NOT DETECTED NOT DETECTED Final   Klebsiella pneumoniae NOT DETECTED NOT DETECTED Final   Proteus species NOT DETECTED NOT DETECTED Final   Salmonella species NOT DETECTED NOT DETECTED Final   Serratia marcescens NOT DETECTED NOT DETECTED Final   Haemophilus influenzae  NOT DETECTED NOT DETECTED Final   Neisseria meningitidis NOT DETECTED NOT DETECTED Final   Pseudomonas aeruginosa NOT DETECTED NOT DETECTED Final   Stenotrophomonas maltophilia NOT DETECTED NOT DETECTED Final   Candida albicans NOT DETECTED NOT DETECTED Final   Candida auris NOT DETECTED NOT DETECTED Final   Candida glabrata NOT DETECTED NOT DETECTED Final   Candida krusei NOT DETECTED NOT DETECTED Final   Candida parapsilosis NOT DETECTED NOT DETECTED Final   Candida tropicalis NOT DETECTED NOT DETECTED Final   Cryptococcus neoformans/gattii NOT DETECTED NOT DETECTED Final   Methicillin resistance mecA/C DETECTED (A) NOT DETECTED Final    Comment: CRITICAL RESULT CALLED TO, READ BACK BY AND VERIFIED WITH: A. Grandville Silos RN 16:20 10/05/20 ([email or web address]) Performed at [facility], [place] [address] [place], [place] [address]   Culture, blood (Routine X 2) w Reflex to ID Panel     Status: Abnormal   Collection Time: 10/04/20 12:51 PM   Specimen: BLOOD RIGHT HAND  Result Value Ref Range Status   Specimen Description BLOOD RIGHT HAND  Final   Special Requests   Final    BOTTLES DRAWN AEROBIC AND ANAEROBIC Blood Culture adequate volume   Culture  Setup Time   Final    GRAM POSITIVE COCCI AEROBIC BOTTLE ONLY CRITICAL VALUE NOTED.  VALUE IS CONSISTENT WITH PREVIOUSLY REPORTED AND CALLED VALUE.    Culture (A)  Final    STAPHYLOCOCCUS EPIDERMIDIS SUSCEPTIBILITIES PERFORMED ON PREVIOUS CULTURE WITHIN THE LAST 5 DAYS. Performed at [facility], [place] [address] [place],  [address]    Report Status 10/07/2020 FINAL  Final  C Difficile Quick Screen (NO PCR Reflex)     Status: None   Collection Time: 10/04/20  5:19 PM   Specimen: STOOL  Result Value Ref Range Status   C  Diff antigen NEGATIVE NEGATIVE Final   C Diff toxin NEGATIVE NEGATIVE Final   C Diff interpretation No C. difficile detected.  Final    Comment: Performed at [facility], [place] [address], [place], [place]  [address]  Culture, respiratory (non-expectorated)     Status: None   Collection Time: 10/05/20 10:14 PM   Specimen: Tracheal Aspirate; Respiratory  Result Value Ref Range Status   Specimen Description TRACHEAL ASPIRATE  Final   Special Requests NONE  Final   Gram Stain   Final    MODERATE WBC PRESENT, PREDOMINANTLY PMN MODERATE GRAM NEGATIVE RODS FEW YEAST RARE GRAM POSITIVE COCCI IN CHAINS Performed at [facility], [address] [place], [place] [address]    Culture MODERATE KLEBSIELLA PNEUMONIAE  Final   Report Status 10/08/2020 FINAL  Final   Organism ID, Bacteria KLEBSIELLA PNEUMONIAE  Final      Susceptibility   Klebsiella pneumoniae - MIC*    AMPICILLIN RESISTANT Resistant     CEFAZOLIN <=4 SENSITIVE Sensitive     CEFEPIME <=0.12 SENSITIVE Sensitive     CEFTAZIDIME <=1 SENSITIVE Sensitive     CEFTRIAXONE <=0.25 SENSITIVE Sensitive     CIPROFLOXACIN >=4 RESISTANT Resistant     GENTAMICIN <=1 SENSITIVE Sensitive     IMIPENEM <=0.25 SENSITIVE Sensitive     TRIMETH/SULFA <=20 SENSITIVE Sensitive     AMPICILLIN/SULBACTAM 8 SENSITIVE Sensitive     PIP/TAZO 16 SENSITIVE Sensitive     * MODERATE KLEBSIELLA PNEUMONIAE  Culture, blood (routine x 2)     Status: None   Collection Time: 10/08/20  4:17 PM   Specimen: BLOOD RIGHT WRIST  Result Value Ref Range Status   Specimen Description BLOOD RIGHT WRIST  Final   Special Requests   Final    BOTTLES DRAWN AEROBIC ONLY Blood Culture results may not be optimal due to an excessive volume of blood received in culture bottles   Culture   Final    NO GROWTH 5 DAYS Performed at [facility], [place] [address], [place], [place] [address]    Report Status 10/13/2020 FINAL  Final  Culture, blood (routine x 2)     Status: None   Collection Time: 10/08/20  4:17 PM   Specimen: BLOOD RIGHT HAND  Result Value Ref Range Status   Specimen Description BLOOD RIGHT HAND  Final   Special Requests   Final    BOTTLES DRAWN AEROBIC ONLY  Blood Culture results may not be optimal due to an excessive volume of blood received in culture bottles   Culture   Final    NO GROWTH 5 DAYS Performed at [facility], [place] [address] [place], [place] [address]    Report Status 10/13/2020 FINAL  Final  Culture, respiratory (non-expectorated)     Status: None   Collection Time: 10/14/20  1:41 PM   Specimen: Tracheal Aspirate; Respiratory  Result Value Ref Range Status   Specimen Description TRACHEAL ASPIRATE  Final   Special Requests NONE  Final   Gram Stain   Final    RARE WBC PRESENT, PREDOMINANTLY PMN RARE GRAM NEGATIVE RODS    Culture   Final    RARE Normal respiratory flora-no Staph aureus or Pseudomonas seen Performed at [facility], [address], [place],  [address]    Report Status 10/17/2020 FINAL  Final    Coagulation Studies: No results for input(s): LABPROT, INR in the last 72 hours.  Urinalysis: No results  for input(s): COLORURINE, LABSPEC, [place], GLUCOSEU, HGBUR, BILIRUBINUR, KETONESUR, PROTEINUR, UROBILINOGEN, NITRITE, LEUKOCYTESUR in the last 72 hours.  Invalid input(s): APPERANCEUR    Imaging: No results found.   Medications:     iohexol, lidocaine  Assessment/ Plan:  54 y.o. 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   1.  ESRD on HD.  Patient seen during dialysis treatment this AM.  Sitting up in a chair.  Tolerating the treatment quite well.  2.  Hypotension.  Patient has albumin available for blood pressure support during dialysis  treatments.  Also on midodrine.  3.  Anemia of chronic kidney disease.   Lab Results  Component Value Date   HGB 7.7 (L) 10/25/2020  Hemoglobin drifting down and currently 7.7.  Maintain the patient on Retacrit.  Received blood transfusion last week.  4.  Secondary hyperparathyroidism.  Phosphorus acceptable at 2.5.  Continue to monitor.  5.  Acute respiratory failure.  Patient doing well status post decannulation.    LOS: 0 Jeremy Yu 11/3/20217:51 AM

## 2020-10-27 ENCOUNTER — Other Ambulatory Visit (HOSPITAL_COMMUNITY): Payer: Medicare Other

## 2020-10-27 LAB — RENAL FUNCTION PANEL
Albumin: 2.1 g/dL — ABNORMAL LOW (ref 3.5–5.0)
Anion gap: 11 (ref 5–15)
BUN: 52 mg/dL — ABNORMAL HIGH (ref 6–20)
CO2: 26 mmol/L (ref 22–32)
Calcium: 10.4 mg/dL — ABNORMAL HIGH (ref 8.9–10.3)
Chloride: 96 mmol/L — ABNORMAL LOW (ref 98–111)
Creatinine, Ser: 3.63 mg/dL — ABNORMAL HIGH (ref 0.61–1.24)
GFR, Estimated: 19 mL/min — ABNORMAL LOW (ref >60)
Glucose, Bld: 93 mg/dL (ref 70–99)
Phosphorus: 2.6 mg/dL (ref 2.5–4.6)
Potassium: 3.5 mmol/L (ref 3.5–5.1)
Sodium: 133 mmol/L — ABNORMAL LOW (ref 135–145)

## 2020-10-27 LAB — CBC
HCT: 26.3 % — ABNORMAL LOW (ref 39.0–52.0)
Hemoglobin: 7.6 g/dL — ABNORMAL LOW (ref 13.0–17.0)
MCH: 28 pg (ref 26.0–34.0)
MCHC: 28.9 g/dL — ABNORMAL LOW (ref 30.0–36.0)
MCV: 97 fL (ref 80.0–100.0)
Platelets: 762 10*3/uL — ABNORMAL HIGH (ref 150–400)
RBC: 2.71 MIL/uL — ABNORMAL LOW (ref 4.22–5.81)
RDW: 20.2 % — ABNORMAL HIGH (ref 11.5–15.5)
WBC: 19.8 10*3/uL — ABNORMAL HIGH (ref 4.0–10.5)
nRBC: 0.1 % (ref 0.0–0.2)

## 2020-10-27 LAB — HEPATITIS B CORE ANTIBODY, TOTAL: Hep B Core Total Ab: REACTIVE — AB

## 2020-10-27 LAB — TRIGLYCERIDES: Triglycerides: 209 mg/dL — ABNORMAL HIGH (ref <150)

## 2020-10-27 LAB — HEPATITIS B SURFACE ANTIGEN: Hepatitis B Surface Ag: NONREACTIVE

## 2020-10-27 LAB — MAGNESIUM: Magnesium: 2.2 mg/dL (ref 1.7–2.4)

## 2020-10-27 NOTE — Progress Notes (Signed)
[facility]  ROUNDING NOTE   Subjective:  Patient states that he does not want to dialyze in a chair today secondary to pain. Therefore he will be put back in his bed and undergo dialysis treatment.  Objective:  Vital signs in last 24 hours:  Temperature 98.4 pulse ninety-four respirations twenty-four blood pressure 133/71   Physical Exam: General:  Chronically ill-appearing  Head:  Normocephalic, atraumatic. Moist oral mucosal membranes  Eyes:  Anicteric  Neck:  Supple  Lungs:   Scattered rhonchi, normal effort  Heart:  S1S2 no rubs  Abdomen:   Soft, nontender, bowel sounds present, PEG in place, colostomy  Extremities:  Right AKA  Neurologic:  Awake, alert, follows commands  Skin:  No lesions  Access:  Left upper extremity AV fistula    Basic Metabolic Panel: Recent Labs  Lab 10/23/20 0605 10/23/20 2205 10/24/20 0409 10/25/20 0438 10/27/20 0458  NA 136  --   --  135 133*  K 3.1* 3.4* 3.4* 4.0 3.5  CL 102  --   --  99 96*  CO2 23  --   --  26 26  GLUCOSE 105*  --   --  135* 93  BUN 69*  --   --  55* 52*  CREATININE 4.14*  --   --  3.36* 3.63*  CALCIUM 10.3  --   --  10.2 10.4*  MG 2.2  --   --  2.2 2.2  PHOS 3.7  --   --  2.5 2.6    Liver Function Tests: Recent Labs  Lab 10/23/20 0605 10/25/20 0438 10/27/20 0458  ALBUMIN 1.9* 2.0* 2.1*   No results for input(s): LIPASE, AMYLASE in the last 168 hours. No results for input(s): AMMONIA in the last 168 hours.  CBC: Recent Labs  Lab 10/21/20 0700 10/22/20 0457 10/23/20 0605 10/25/20 0438 10/27/20 0458  WBC 24.0* 20.9* 20.4* 19.2* 19.8*  HGB 6.4* 7.5* 8.2* 7.7* 7.6*  HCT 22.5* 25.8* 28.7* 26.4* 26.3*  MCV 100.0 97.7 97.6 97.4 97.0  PLT 725* 704* 776* 783* 762*    Cardiac Enzymes: No results for input(s): CKTOTAL, CKMB, CKMBINDEX, TROPONINI in the last 168 hours.  BNP: Invalid input(s): POCBNP  CBG: No results for input(s): GLUCAP in the last 168 hours.  Microbiology: Results  for orders placed or performed during the hospital encounter of 09/22/20  Culture, respiratory (non-expectorated)     Status: None   Collection Time: 09/23/20 11:25 AM   Specimen: Tracheal Aspirate; Respiratory  Result Value Ref Range Status   Specimen Description TRACHEAL ASPIRATE  Final   Special Requests NONE  Final   Gram Stain   Final    RARE WBC PRESENT,BOTH PMN AND MONONUCLEAR NO ORGANISMS SEEN Performed at [facility], [address] [place], [place] [address]    Culture FEW KLEBSIELLA PNEUMONIAE  Final   Report Status 09/25/2020 FINAL  Final   Organism ID, Bacteria KLEBSIELLA PNEUMONIAE  Final      Susceptibility   Klebsiella pneumoniae - MIC*    AMPICILLIN >=32 RESISTANT Resistant     CEFAZOLIN <=4 SENSITIVE Sensitive     CEFEPIME 0.25 SENSITIVE Sensitive     CEFTAZIDIME <=1 SENSITIVE Sensitive     CEFTRIAXONE 1 SENSITIVE Sensitive     CIPROFLOXACIN <=0.25 SENSITIVE Sensitive     GENTAMICIN <=1 SENSITIVE Sensitive     IMIPENEM <=0.25 SENSITIVE Sensitive     TRIMETH/SULFA <=20 SENSITIVE Sensitive     AMPICILLIN/SULBACTAM 16 INTERMEDIATE Intermediate  PIP/TAZO 64 INTERMEDIATE Intermediate     * FEW KLEBSIELLA PNEUMONIAE  Culture, blood (routine x 2)     Status: None   Collection Time: 09/23/20  4:26 PM   Specimen: BLOOD  Result Value Ref Range Status   Specimen Description BLOOD BLOOD RIGHT HAND  Final   Special Requests   Final    AEROBIC BOTTLE ONLY Blood Culture results may not be optimal due to an inadequate volume of blood received in culture bottles   Culture   Final    NO GROWTH 5 DAYS Performed at [facility], [place] [address], [place], [place] [address]    Report Status 09/28/2020 FINAL  Final  Culture, blood (single)     Status: None   Collection Time: 09/24/20  6:00 AM   Specimen: BLOOD RIGHT HAND  Result Value Ref Range Status   Specimen Description BLOOD RIGHT HAND  Final   Special Requests   Final    BOTTLES DRAWN AEROBIC ONLY Blood  Culture results may not be optimal due to an inadequate volume of blood received in culture bottles   Culture   Final    NO GROWTH 5 DAYS Performed at [facility], [place] [address] [place], [place] [address]    Report Status 09/29/2020 FINAL  Final  Body fluid culture     Status: None   Collection Time: 09/29/20 12:27 PM   Specimen: PATH Cytology Peritoneal fluid  Result Value Ref Range Status   Specimen Description PERITONEAL FLUID  Final   Special Requests NONE  Final   Gram Stain   Final    WBC PRESENT,BOTH PMN AND MONONUCLEAR NO ORGANISMS SEEN CYTOSPIN SMEAR    Culture   Final    NO GROWTH 3 DAYS Performed at [facility], [address], [place], [place] [address]    Report Status 10/03/2020 FINAL  Final  Culture, blood (Routine X 2) w Reflex to ID Panel     Status: Abnormal   Collection Time: 10/04/20 12:50 PM   Specimen: BLOOD  Result Value Ref Range Status   Specimen Description BLOOD RIGHT ANTECUBITAL  Final   Special Requests   Final    BOTTLES DRAWN AEROBIC AND ANAEROBIC Blood Culture adequate volume   Culture  Setup Time   Final    GRAM POSITIVE COCCI IN CLUSTERS AEROBIC BOTTLE ONLY CRITICAL RESULT CALLED TO, READ BACK BY AND VERIFIED WITH: A. Grandville Silos RN 16:20 10/05/20 ([email or web address]) Performed at [facility], [place] [address], [place], [place] [address]    Culture STAPHYLOCOCCUS EPIDERMIDIS (A)  Final   Report Status 10/07/2020 FINAL  Final   Organism ID, Bacteria STAPHYLOCOCCUS EPIDERMIDIS  Final      Susceptibility   Staphylococcus epidermidis - MIC*    CIPROFLOXACIN >=8 RESISTANT Resistant     ERYTHROMYCIN >=8 RESISTANT Resistant     GENTAMICIN >=16 RESISTANT Resistant     OXACILLIN >=4 RESISTANT Resistant     TETRACYCLINE 2 SENSITIVE Sensitive     VANCOMYCIN 2 SENSITIVE Sensitive     TRIMETH/SULFA 160 RESISTANT Resistant     CLINDAMYCIN >=8 RESISTANT Resistant     RIFAMPIN <=0.5 SENSITIVE Sensitive     Inducible Clindamycin NEGATIVE  Sensitive     * STAPHYLOCOCCUS EPIDERMIDIS  Blood Culture ID Panel (Reflexed)     Status: Abnormal   Collection Time: 10/04/20 12:50 PM  Result Value Ref Range Status   Enterococcus faecalis NOT DETECTED NOT DETECTED Final   Enterococcus Faecium  NOT DETECTED NOT DETECTED Final   Listeria monocytogenes NOT DETECTED NOT DETECTED Final   Staphylococcus species DETECTED (A) NOT DETECTED Final    Comment: CRITICAL RESULT CALLED TO, READ BACK BY AND VERIFIED WITH: A. Grandville Silos RN 16:20 10/05/20 ([email or web address])    Staphylococcus aureus (BCID) NOT DETECTED NOT DETECTED Final   Staphylococcus epidermidis DETECTED (A) NOT DETECTED Final    Comment: Methicillin (oxacillin) resistant coagulase negative staphylococcus. Possible blood culture contaminant (unless isolated from more than one blood culture draw or clinical case suggests pathogenicity). No antibiotic treatment is indicated for blood  culture contaminants. CRITICAL RESULT CALLED TO, READ BACK BY AND VERIFIED WITH: A. Grandville Silos RN 16:20 10/05/20 ([email or web address])    Staphylococcus lugdunensis NOT DETECTED NOT DETECTED Final   Streptococcus species NOT DETECTED NOT DETECTED Final   Streptococcus agalactiae NOT DETECTED NOT DETECTED Final   Streptococcus pneumoniae NOT DETECTED NOT DETECTED Final   Streptococcus pyogenes NOT DETECTED NOT DETECTED Final   A.calcoaceticus-baumannii NOT DETECTED NOT DETECTED Final   Bacteroides fragilis NOT DETECTED NOT DETECTED Final   Enterobacterales NOT DETECTED NOT DETECTED Final   Enterobacter cloacae complex NOT DETECTED NOT DETECTED Final   Escherichia coli NOT DETECTED NOT DETECTED Final   Klebsiella aerogenes NOT DETECTED NOT DETECTED Final   Klebsiella oxytoca NOT DETECTED NOT DETECTED Final   Klebsiella pneumoniae NOT DETECTED NOT DETECTED Final   Proteus species NOT DETECTED NOT DETECTED Final   Salmonella species NOT DETECTED NOT DETECTED Final   Serratia marcescens NOT DETECTED NOT DETECTED Final    Haemophilus influenzae NOT DETECTED NOT DETECTED Final   Neisseria meningitidis NOT DETECTED NOT DETECTED Final   Pseudomonas aeruginosa NOT DETECTED NOT DETECTED Final   Stenotrophomonas maltophilia NOT DETECTED NOT DETECTED Final   Candida albicans NOT DETECTED NOT DETECTED Final   Candida auris NOT DETECTED NOT DETECTED Final   Candida glabrata NOT DETECTED NOT DETECTED Final   Candida krusei NOT DETECTED NOT DETECTED Final   Candida parapsilosis NOT DETECTED NOT DETECTED Final   Candida tropicalis NOT DETECTED NOT DETECTED Final   Cryptococcus neoformans/gattii NOT DETECTED NOT DETECTED Final   Methicillin resistance mecA/C DETECTED (A) NOT DETECTED Final    Comment: CRITICAL RESULT CALLED TO, READ BACK BY AND VERIFIED WITH: A. Grandville Silos RN 16:20 10/05/20 ([email or web address]) Performed at [facility], [place] [address] [place], [place] [address]   Culture, blood (Routine X 2) w Reflex to ID Panel     Status: Abnormal   Collection Time: 10/04/20 12:51 PM   Specimen: BLOOD RIGHT HAND  Result Value Ref Range Status   Specimen Description BLOOD RIGHT HAND  Final   Special Requests   Final    BOTTLES DRAWN AEROBIC AND ANAEROBIC Blood Culture adequate volume   Culture  Setup Time   Final    GRAM POSITIVE COCCI AEROBIC BOTTLE ONLY CRITICAL VALUE NOTED.  VALUE IS CONSISTENT WITH PREVIOUSLY REPORTED AND CALLED VALUE.    Culture (A)  Final    STAPHYLOCOCCUS EPIDERMIDIS SUSCEPTIBILITIES PERFORMED ON PREVIOUS CULTURE WITHIN THE LAST 5 DAYS. Performed at [facility], [place] [address], [place],  [address]    Report Status 10/07/2020 FINAL  Final  C Difficile Quick Screen (NO PCR Reflex)     Status: None   Collection Time: 10/04/20  5:19 PM   Specimen: STOOL  Result Value Ref Range Status   C Diff antigen NEGATIVE NEGATIVE Final   C Diff toxin NEGATIVE NEGATIVE Final   C Diff interpretation No C. difficile  detected.  Final    Comment: Performed at [facility], [place]  [address], [place], [place] [address]  Culture, respiratory (non-expectorated)     Status: None   Collection Time: 10/05/20 10:14 PM   Specimen: Tracheal Aspirate; Respiratory  Result Value Ref Range Status   Specimen Description TRACHEAL ASPIRATE  Final   Special Requests NONE  Final   Gram Stain   Final    MODERATE WBC PRESENT, PREDOMINANTLY PMN MODERATE GRAM NEGATIVE RODS FEW YEAST RARE GRAM POSITIVE COCCI IN CHAINS Performed at [facility], [address] [place], [place] [address]    Culture MODERATE KLEBSIELLA PNEUMONIAE  Final   Report Status 10/08/2020 FINAL  Final   Organism ID, Bacteria KLEBSIELLA PNEUMONIAE  Final      Susceptibility   Klebsiella pneumoniae - MIC*    AMPICILLIN RESISTANT Resistant     CEFAZOLIN <=4 SENSITIVE Sensitive     CEFEPIME <=0.12 SENSITIVE Sensitive     CEFTAZIDIME <=1 SENSITIVE Sensitive     CEFTRIAXONE <=0.25 SENSITIVE Sensitive     CIPROFLOXACIN >=4 RESISTANT Resistant     GENTAMICIN <=1 SENSITIVE Sensitive     IMIPENEM <=0.25 SENSITIVE Sensitive     TRIMETH/SULFA <=20 SENSITIVE Sensitive     AMPICILLIN/SULBACTAM 8 SENSITIVE Sensitive     PIP/TAZO 16 SENSITIVE Sensitive     * MODERATE KLEBSIELLA PNEUMONIAE  Culture, blood (routine x 2)     Status: None   Collection Time: 10/08/20  4:17 PM   Specimen: BLOOD RIGHT WRIST  Result Value Ref Range Status   Specimen Description BLOOD RIGHT WRIST  Final   Special Requests   Final    BOTTLES DRAWN AEROBIC ONLY Blood Culture results may not be optimal due to an excessive volume of blood received in culture bottles   Culture   Final    NO GROWTH 5 DAYS Performed at [facility], [place] [address] [place], [place] [address]    Report Status 10/13/2020 FINAL  Final  Culture, blood (routine x 2)     Status: None   Collection Time: 10/08/20  4:17 PM   Specimen: BLOOD RIGHT HAND  Result Value Ref Range Status   Specimen Description BLOOD RIGHT HAND  Final   Special Requests   Final     BOTTLES DRAWN AEROBIC ONLY Blood Culture results may not be optimal due to an excessive volume of blood received in culture bottles   Culture   Final    NO GROWTH 5 DAYS Performed at [facility], [place] [address] [place], [place] [address]    Report Status 10/13/2020 FINAL  Final  Culture, respiratory (non-expectorated)     Status: None   Collection Time: 10/14/20  1:41 PM   Specimen: Tracheal Aspirate; Respiratory  Result Value Ref Range Status   Specimen Description TRACHEAL ASPIRATE  Final   Special Requests NONE  Final   Gram Stain   Final    RARE WBC PRESENT, PREDOMINANTLY PMN RARE GRAM NEGATIVE RODS    Culture   Final    RARE Normal respiratory flora-no Staph aureus or Pseudomonas seen Performed at [facility], [address], [place], [place] [address]    Report Status 10/17/2020 FINAL  Final    Coagulation Studies: No results for input(s): LABPROT, INR in the last 72 hours.  Urinalysis: No results for input(s): COLORURINE, LABSPEC, PHURINE, GLUCOSEU, HGBUR, BILIRUBINUR, KETONESUR, PROTEINUR, UROBILINOGEN, NITRITE, LEUKOCYTESUR in the last 72 hours.  Invalid input(s):  APPERANCEUR    Imaging: No results found.   Medications:     iohexol, lidocaine  Assessment/ Plan:  54 y.o. 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   1.  ESRD on HD.  Patient due for dialysis treatment this AM.  States that he does not want to dialyze in a chair today.  Therefore we will dialyze him in his bed today.  2.  Hypotension.  Continue  midodrine as well as albumin for blood pressure support.  3.  Anemia of chronic kidney disease.   Lab Results  Component Value Date   HGB 7.6 (L) 10/27/2020  Hemoglobin currently 7.6.  Maintain the patient on Retacrit.  4.  Secondary hyperparathyroidism.  Phosphorus 2.6 and acceptable.  5.  Acute respiratory failure.  Patient continues to do well off of the ventilator and status post decannulation.    LOS: 0 Alvita Fana 11/5/20218:05 AM

## 2020-10-27 NOTE — Progress Notes (Signed)
PROGRESS NOTE    Jeremy Yu  XFG:[id number] DOB: 1966/07/20 DOA: 09/22/2020  Brief Narrative:  Jeremy Yu is an 54 y.o. male with medical history significant of end-stage renal disease on hemodialysis, diabetic nephropathy, chronic systolic and diastolic congestive heart failure, peripheral arterial disease, complete heart block, seizures, hyperlipidemia, hypertension, prior MI, GERD who had recent hospitalization from 07/17/2020 until 07/25/2020 for necrotizing soft tissue infection, sepsis.  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  He was discharged on 07/25/2020.  Patient apparently was at [facility] and rehab facility.  While undergoing dialysis on 08/20/2019 when he developed chills, rigors.  Blood cultures were taken and he was admitted to [facility] on 08/19/2020.  He was given IV vancomycin, ceftriaxone for probable sepsis.  He was found to have diarrhea and stool for C. difficile was positive.  Patient was placed on oral vancomycin.  CT on 08/20/2020 confirmed pancolitis with large left inguinal hernia, airspace disease with concern for pneumonia.  On 08/22/2019 when he became hypotensive, both IV and p.o. Flagyl was added to the p.o. vancomycin.  Infectious disease was consulted with addition of Dificid.  However, patient continued to worsen.  On 08/24/2019 when he was transferred urgently to the ICU and intubated for airway protection because he was becoming encephalopathic.  He was also given vasopressors for shock.  Fecal transplant was attempted by GI at the bedside however, inability to pass the scope due to large nonreducible inguinal hernia.  Surgery was consulted and patient was taken emergently for subtotal colectomy and ileostomy.  Eravacycline was added for C. difficile/toxic megacolon.   He was seen by surgery with clearance for tube feeds on 08/25/2020.  Patient postoperatively remained encephalopathic.  Gradually mental status improved.  However, he had significant drop in platelet count on 08/27/2020.  Hematology was consulted and they thought the thrombocytopenia was likely secondary to sepsis, recent surgery, critical illness, DIC.  He was given platelet transfusion with additional PRBC due to oozing from the surgical site, drop in hemoglobin levels.  He was also given vitamin K for ongoing liver dysfunction.  He apparently received total of 8 units platelets.  Repeat CT was done which showed left lingular consolidation, bilateral effusions.  Abdomen showed postop free fluid, gallstones.  He had progressive thrombocytopenia, abnormal LFTs therefore Flagyl, Eravacycline was stopped.  He had blood cultures on 08/28/2020 that showed staph.  He was treated with empiric antibiotics.  He was also treated with micafungin. On 09/04/2020 patient had PEA arrest during which she was intubated, subsequently underwent EGD with evidence of bleeding ulcer, gastritis.  He continued to have oozing from his ileostomy and was receiving blood products.  He was placed on PPI drip.  Right upper quadrant ultrasound showed possible cholecystitis.  He was to undergo drain placement but underwent HIDA on 09/09/2020, gallbladder was not visualized.  On 09/11/2019 when he had large amount of blood from the stoma and received PRBC.  He had EGD with bleeding ulcer that was clipped.  On 09/12/2019 when he had paracentesis with 5.2 L removed.  Patient had fluid cultures grew VRE for which he was treated with daptomycin for 14 days.  He failed ventilator weaning efforts therefore tracheostomy was done on 09/14/2020.  He has unstageable sacral pressure ulcer for which he received wound care. He was transferred and admitted to [facility] on 09/22/2020. -  He previously had a trach but now decannulated.  He is complaining of  some mild cough with occasional sputum but overall respiratory status appears to be stable.  He has received multiple antibiotics for Klebsiella pneumonia and also for worsening leukocytosis with fevers.   -As soon as antibiotics are discontinued he starts having fevers along with worsening leukocytosis.  He is currently on empiric meropenem, Flagyl. -WBC count improved but now worsening again with low-grade fevers.  He has a central line in place.  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  He had severe sepsis with septic shock at the acute facility.  He also had PEA arrest at the outside facility and had to be intubated.  He had pneumonia with Klebsiella.  He received treatment with multiple rounds of antibiotics including IV vancomycin, meropenem, Flagyl.  He is currently decannulated.  Although he is complaining of some mild cough his respiratory status appears to be stable at this time.   Sepsis with shock: Patient was on pressors at the outside facility. Shock is resolved.  He already completed antibiotics.  However, as soon as antibiotics are stopped he starts having fever with worsening leukocytosis. He is high risk for recurrent sepsis.  He is currently on IV meropenem, Flagyl.  He is now having leukocytosis with low-grade fevers again.  He has a central line in place.  I really recommend to remove the central line once we are able to  get a peripheral IV. I am told that the patient is a very hard stick.  Suggest to check blood cultures.  Leukocytosis: Treated with multiple rounds of antibiotics as mentioned above.  He is on IV meropenem, p.o. Flagyl.  WBC count improved initially but now worsening again with low-grade fevers.  As mentioned above, he is high risk for recurrent sepsis.  He has a central line in place.  Ideally recommend to obtain a peripheral IV and remove the central line.  However, patient apparently is a very hard stick.  Suggest to send for blood cultures. If his counts are worsening despite being on antibiotics then we may also need to consider CT imaging of the abdomen and pelvis again to evaluate further foci of infection. -Tentative end date for the meropenem is 10/28/2020.  As mentioned above suggest to send for blood cultures.  If blood cultures showing gram-positive organisms then suggest to start him on IV daptomycin.  Unfortunately we are unable to use IV vancomycin due to lack of resources and inability to check Vanco trough.  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 Continue local wound care.  Antibiotics as mentioned above.  He was supposed to be on IV Flagyl and p.o. vancomycin.  However, IV Flagyl on backorder therefore on p.o. Flagyl.  Once the p.o. Flagyl and then start him on p.o. vancomycin for a week.  Peritonitis: Patient had peritonitis at the acute facility with peritoneal fluid cultures that showed VRE.  He status post treatment with 14 days of daptomycin.  Previous CT of the  abdomen and pelvis from here showed large volume ascites. He is status post paracentesis.  Gram stain showed WBC with PMN.  However, was unable to find the cell count/differential on the fluid.  Cultures remain negative.  Regardless he is on empiric antibiotics due to worsening leukocytosis. He is at high risk for recurrent peritonitis. Continue to monitor closely.  If he  starts having high fevers or worsening leukocytosis then as mentioned above we may need to consider repeating the abdominal imaging.  Cholecystitis: He also had cholecystitis and is status post drain placement.  Antibiotics as mentioned above.  End-stage renal disease on dialysis: Dialysis per nephrology.  Antibiotics renally dosed.  Necrotizing fasciitis status post right AKA: Continue local wound care.  Diabetes mellitus: Continue to monitor Accu-Cheks, medications and management of diabetes per the primary team.  He will need proper glycemic control in order to enable healing.  Bleeding ulcer/gastritis: Status post EGD and clipping at the acute facility.  On PPI.  Continue to monitor.  Further management per primary team.  Sacrococcygeal pressure ulcer unstageable: Continue local wound care.  If worsening consider consulting surgery for possible debridement.  Thrombocytosis: Patient having elevated platelet count.  Continue to monitor.  Unfortunately unable to give aspirin because he also had GI bleed.  Anemia: Continue to monitor hemoglobin.  Further management per the primary team.  Unfortunately due to his complex medical problems he is high risk for worsening and decompensation.  Plan of care discussed with the patient, primary team and pharmacy.  Subjective: WBC count worsening again.  He is having low-grade fevers again.  T-max 100.1.  Objective: Vitals: Temperature 100.1, pulse 100, respiratory rate 32, blood pressure 130/68, pulse oximetry 100%.  Examination: Constitutional:  Awake, not in any acute distress Head: Atraumatic, normocephalic Eyes: pupils equal and reactive, legally blind  ENMT: external ears and nose appear normal, normal hearing, Lips appear normal, moist oral mucosa Neck:  Decannulated, supple CVS: S1-S2  Respiratory:  Decreased breath sound lower lobes, otherwise clear to auscultation Abdomen:  Soft, nontender, wound VAC in place, right-sided  drain, ostomy, PEG tube Musculoskeletal: Right lower extremity AKA Neuro: Legally blind, has debility with generalized weakness Psych: stable mood Skin: no rashes   Data Reviewed: I have personally reviewed following labs and imaging studies  CBC: Recent Labs  Lab 10/21/20 0700 10/22/20 0457 10/23/20 0605 10/25/20 0438 10/27/20 0458  WBC 24.0* 20.9* 20.4* 19.2* 19.8*  HGB 6.4* 7.5* 8.2* 7.7* 7.6*  HCT 22.5* 25.8* 28.7* 26.4* 26.3*  MCV 100.0 97.7 97.6 97.4 97.0  PLT 725* 704* 776* 783* 762*    Basic Metabolic Panel: Recent Labs  Lab 10/23/20 0605 10/23/20 2205 10/24/20 0409 10/25/20 0438 10/27/20 0458  NA 136  --   --  135 133*  K 3.1* 3.4* 3.4* 4.0 3.5  CL 102  --   --  99 96*  CO2 23  --   --  26 26  GLUCOSE 105*  --   --  135* 93  BUN 69*  --   --  55* 52*  CREATININE 4.14*  --   --  3.36* 3.63*  CALCIUM 10.3  --   --  10.2 10.4*  MG 2.2  --   --  2.2 2.2  PHOS 3.7  --   --  2.5 2.6    GFR: CrCl cannot be calculated (Unknown ideal weight.).  Liver Function Tests: Recent Labs  Lab 10/23/20 0605 10/25/20 0438 10/27/20 0458  ALBUMIN 1.9* 2.0* 2.1*  CBG: No results for input(s): GLUCAP in the last 168 hours.   No results found for this or any previous visit (from the past 240 hour(s)).    Radiology Studies: DG CHEST PORT 1 VIEW  Result Date: 10/27/2020 CLINICAL DATA:  Pneumonia and fevers EXAM: PORTABLE CHEST 1 VIEW COMPARISON:  10/09/2020 FINDINGS: Tracheostomy tube and nasogastric catheter have been removed in the interval. Tunneled PICC line is noted on the left stable in appearance. Cardiac shadow is enlarged but stable. Right basilar atelectasis is again noted and stable. No acute abnormality seen. IMPRESSION: No significant change from the prior exam with persistent right basilar atelectasis. Electronically Signed   By: Inez Catalina M.D.   On: 10/27/2020 15:49    Scheduled Meds: Please see MAR   Yaakov Guthrie, MD  10/27/2020, 5:09 PM

## 2020-10-28 LAB — HEPATITIS B SURFACE ANTIBODY, QUANTITATIVE: Hep B S AB Quant (Post): 222.4 m[IU]/mL (ref >9.9)

## 2020-10-30 ENCOUNTER — Other Ambulatory Visit (HOSPITAL_COMMUNITY): Payer: Medicare Other

## 2020-10-30 ENCOUNTER — Ambulatory Visit (HOSPITAL_COMMUNITY): Payer: Medicare Other | Attending: Nurse Practitioner

## 2020-10-30 DIAGNOSIS — M79661 Pain in right lower leg: Secondary | ICD-10-CM | POA: Diagnosis not present

## 2020-10-30 DIAGNOSIS — M79662 Pain in left lower leg: Secondary | ICD-10-CM | POA: Insufficient documentation

## 2020-10-30 DIAGNOSIS — M7989 Other specified soft tissue disorders: Secondary | ICD-10-CM | POA: Diagnosis not present

## 2020-10-30 LAB — CBC
HCT: 24.7 % — ABNORMAL LOW (ref 39.0–52.0)
Hemoglobin: 7.4 g/dL — ABNORMAL LOW (ref 13.0–17.0)
MCH: 29 pg (ref 26.0–34.0)
MCHC: 30 g/dL (ref 30.0–36.0)
MCV: 96.9 fL (ref 80.0–100.0)
Platelets: 983 10*3/uL (ref 150–400)
RBC: 2.55 MIL/uL — ABNORMAL LOW (ref 4.22–5.81)
RDW: 20.2 % — ABNORMAL HIGH (ref 11.5–15.5)
WBC: 18 10*3/uL — ABNORMAL HIGH (ref 4.0–10.5)
nRBC: 0.8 % — ABNORMAL HIGH (ref 0.0–0.2)

## 2020-10-30 LAB — RENAL FUNCTION PANEL
Albumin: 2.1 g/dL — ABNORMAL LOW (ref 3.5–5.0)
Anion gap: 11 (ref 5–15)
BUN: 75 mg/dL — ABNORMAL HIGH (ref 6–20)
CO2: 27 mmol/L (ref 22–32)
Calcium: 10.8 mg/dL — ABNORMAL HIGH (ref 8.9–10.3)
Chloride: 97 mmol/L — ABNORMAL LOW (ref 98–111)
Creatinine, Ser: 4.49 mg/dL — ABNORMAL HIGH (ref 0.61–1.24)
GFR, Estimated: 15 mL/min — ABNORMAL LOW (ref >60)
Glucose, Bld: 146 mg/dL — ABNORMAL HIGH (ref 70–99)
Phosphorus: 3 mg/dL (ref 2.5–4.6)
Potassium: 3.9 mmol/L (ref 3.5–5.1)
Sodium: 135 mmol/L (ref 135–145)

## 2020-10-30 LAB — MAGNESIUM: Magnesium: 2.3 mg/dL (ref 1.7–2.4)

## 2020-10-30 NOTE — Progress Notes (Signed)
Bilateral Lower Ext. study completed.   See CVProc for preliminary results.   Dru Primeau, RDMS, RVT 

## 2020-10-30 NOTE — Progress Notes (Signed)
[facility]  ROUNDING NOTE   Subjective:  Patient undergo hemodialysis later this morning. Resting comfortably in bed at the moment.   Objective:  Vital signs in last 24 hours:  Temperature 96.4 pulse 94 respirations 43 blood pressure 129/72   Physical Exam: General:  Chronically ill-appearing  Head:  Normocephalic, atraumatic. Moist oral mucosal membranes  Eyes:  Anicteric  Neck:  Supple  Lungs:   Scattered rhonchi, normal effort  Heart:  S1S2 no rubs  Abdomen:   Soft, nontender, bowel sounds present, PEG in place, colostomy  Extremities:  Right AKA  Neurologic:  Awake, alert, follows commands  Skin:  No lesions  Access:  Left upper extremity AV fistula    Basic Metabolic Panel: Recent Labs  Lab 10/23/20 2205 10/24/20 0409 10/25/20 0438 10/27/20 0458  NA  --   --  135 133*  K 3.4* 3.4* 4.0 3.5  CL  --   --  99 96*  CO2  --   --  26 26  GLUCOSE  --   --  135* 93  BUN  --   --  55* 52*  CREATININE  --   --  3.36* 3.63*  CALCIUM  --   --  10.2 10.4*  MG  --   --  2.2 2.2  PHOS  --   --  2.5 2.6    Liver Function Tests: Recent Labs  Lab 10/25/20 0438 10/27/20 0458  ALBUMIN 2.0* 2.1*   No results for input(s): LIPASE, AMYLASE in the last 168 hours. No results for input(s): AMMONIA in the last 168 hours.  CBC: Recent Labs  Lab 10/25/20 0438 10/27/20 0458  WBC 19.2* 19.8*  HGB 7.7* 7.6*  HCT 26.4* 26.3*  MCV 97.4 97.0  PLT 783* 762*    Cardiac Enzymes: No results for input(s): CKTOTAL, CKMB, CKMBINDEX, TROPONINI in the last 168 hours.  BNP: Invalid input(s): POCBNP  CBG: No results for input(s): GLUCAP in the last 168 hours.  Microbiology: Results for orders placed or performed during the hospital encounter of 09/22/20  Culture, respiratory (non-expectorated)     Status: None   Collection Time: 09/23/20 11:25 AM   Specimen: Tracheal Aspirate; Respiratory  Result Value Ref Range Status   Specimen Description TRACHEAL ASPIRATE   Final   Special Requests NONE  Final   Gram Stain   Final    RARE WBC PRESENT,BOTH PMN AND MONONUCLEAR NO ORGANISMS SEEN Performed at [facility], [address] [place], [place] [address]    Culture FEW KLEBSIELLA PNEUMONIAE  Final   Report Status 09/25/2020 FINAL  Final   Organism ID, Bacteria KLEBSIELLA PNEUMONIAE  Final      Susceptibility   Klebsiella pneumoniae - MIC*    AMPICILLIN >=32 RESISTANT Resistant     CEFAZOLIN <=4 SENSITIVE Sensitive     CEFEPIME 0.25 SENSITIVE Sensitive     CEFTAZIDIME <=1 SENSITIVE Sensitive     CEFTRIAXONE 1 SENSITIVE Sensitive     CIPROFLOXACIN <=0.25 SENSITIVE Sensitive     GENTAMICIN <=1 SENSITIVE Sensitive     IMIPENEM <=0.25 SENSITIVE Sensitive     TRIMETH/SULFA <=20 SENSITIVE Sensitive     AMPICILLIN/SULBACTAM 16 INTERMEDIATE Intermediate     PIP/TAZO 64 INTERMEDIATE Intermediate     * FEW KLEBSIELLA PNEUMONIAE  Culture, blood (routine x 2)     Status: None   Collection Time: 09/23/20  4:26 PM   Specimen: BLOOD  Result Value Ref Range Status   Specimen Description BLOOD BLOOD  RIGHT HAND  Final   Special Requests   Final    AEROBIC BOTTLE ONLY Blood Culture results may not be optimal due to an inadequate volume of blood received in culture bottles   Culture   Final    NO GROWTH 5 DAYS Performed at [facility], [place] [address], [place], [place] [address]    Report Status 09/28/2020 FINAL  Final  Culture, blood (single)     Status: None   Collection Time: 09/24/20  6:00 AM   Specimen: BLOOD RIGHT HAND  Result Value Ref Range Status   Specimen Description BLOOD RIGHT HAND  Final   Special Requests   Final    BOTTLES DRAWN AEROBIC ONLY Blood Culture results may not be optimal due to an inadequate volume of blood received in culture bottles   Culture   Final    NO GROWTH 5 DAYS Performed at [facility], [place] [address] [place], [place] [address]    Report Status 09/29/2020 FINAL  Final  Body fluid culture      Status: None   Collection Time: 09/29/20 12:27 PM   Specimen: PATH Cytology Peritoneal fluid  Result Value Ref Range Status   Specimen Description PERITONEAL FLUID  Final   Special Requests NONE  Final   Gram Stain   Final    WBC PRESENT,BOTH PMN AND MONONUCLEAR NO ORGANISMS SEEN CYTOSPIN SMEAR    Culture   Final    NO GROWTH 3 DAYS Performed at [facility], [address] [place], [place] [address]    Report Status 10/03/2020 FINAL  Final  Culture, blood (Routine X 2) w Reflex to ID Panel     Status: Abnormal   Collection Time: 10/04/20 12:50 PM   Specimen: BLOOD  Result Value Ref Range Status   Specimen Description BLOOD RIGHT ANTECUBITAL  Final   Special Requests   Final    BOTTLES DRAWN AEROBIC AND ANAEROBIC Blood Culture adequate volume   Culture  Setup Time   Final    GRAM POSITIVE COCCI IN CLUSTERS AEROBIC BOTTLE ONLY CRITICAL RESULT CALLED TO, READ BACK BY AND VERIFIED WITH: A. Grandville Silos RN 16:20 10/05/20 ([email or web address]) Performed at [facility], [place] [address], [place], [place] [address]    Culture STAPHYLOCOCCUS EPIDERMIDIS (A)  Final   Report Status 10/07/2020 FINAL  Final   Organism ID, Bacteria STAPHYLOCOCCUS EPIDERMIDIS  Final      Susceptibility   Staphylococcus epidermidis - MIC*    CIPROFLOXACIN >=8 RESISTANT Resistant     ERYTHROMYCIN >=8 RESISTANT Resistant     GENTAMICIN >=16 RESISTANT Resistant     OXACILLIN >=4 RESISTANT Resistant     TETRACYCLINE 2 SENSITIVE Sensitive     VANCOMYCIN 2 SENSITIVE Sensitive     TRIMETH/SULFA 160 RESISTANT Resistant     CLINDAMYCIN >=8 RESISTANT Resistant     RIFAMPIN <=0.5 SENSITIVE Sensitive     Inducible Clindamycin NEGATIVE Sensitive     * STAPHYLOCOCCUS EPIDERMIDIS  Blood Culture ID Panel (Reflexed)     Status: Abnormal   Collection Time: 10/04/20 12:50 PM  Result Value Ref Range Status   Enterococcus faecalis NOT DETECTED NOT DETECTED Final   Enterococcus Faecium NOT DETECTED NOT DETECTED Final    Listeria monocytogenes NOT DETECTED NOT DETECTED Final   Staphylococcus species DETECTED (A) NOT DETECTED Final    Comment: CRITICAL RESULT CALLED TO, READ BACK BY AND VERIFIED WITH: A. Grandville Silos RN 16:20 10/05/20 ([email or web address])    Staphylococcus aureus (  BCID) NOT DETECTED NOT DETECTED Final   Staphylococcus epidermidis DETECTED (A) NOT DETECTED Final    Comment: Methicillin (oxacillin) resistant coagulase negative staphylococcus. Possible blood culture contaminant (unless isolated from more than one blood culture draw or clinical case suggests pathogenicity). No antibiotic treatment is indicated for blood  culture contaminants. CRITICAL RESULT CALLED TO, READ BACK BY AND VERIFIED WITH: A. Grandville Silos RN 16:20 10/05/20 ([email or web address])    Staphylococcus lugdunensis NOT DETECTED NOT DETECTED Final   Streptococcus species NOT DETECTED NOT DETECTED Final   Streptococcus agalactiae NOT DETECTED NOT DETECTED Final   Streptococcus pneumoniae NOT DETECTED NOT DETECTED Final   Streptococcus pyogenes NOT DETECTED NOT DETECTED Final   A.calcoaceticus-baumannii NOT DETECTED NOT DETECTED Final   Bacteroides fragilis NOT DETECTED NOT DETECTED Final   Enterobacterales NOT DETECTED NOT DETECTED Final   Enterobacter cloacae complex NOT DETECTED NOT DETECTED Final   Escherichia coli NOT DETECTED NOT DETECTED Final   Klebsiella aerogenes NOT DETECTED NOT DETECTED Final   Klebsiella oxytoca NOT DETECTED NOT DETECTED Final   Klebsiella pneumoniae NOT DETECTED NOT DETECTED Final   Proteus species NOT DETECTED NOT DETECTED Final   Salmonella species NOT DETECTED NOT DETECTED Final   Serratia marcescens NOT DETECTED NOT DETECTED Final   Haemophilus influenzae NOT DETECTED NOT DETECTED Final   Neisseria meningitidis NOT DETECTED NOT DETECTED Final   Pseudomonas aeruginosa NOT DETECTED NOT DETECTED Final   Stenotrophomonas maltophilia NOT DETECTED NOT DETECTED Final   Candida albicans NOT DETECTED NOT DETECTED Final    Candida auris NOT DETECTED NOT DETECTED Final   Candida glabrata NOT DETECTED NOT DETECTED Final   Candida krusei NOT DETECTED NOT DETECTED Final   Candida parapsilosis NOT DETECTED NOT DETECTED Final   Candida tropicalis NOT DETECTED NOT DETECTED Final   Cryptococcus neoformans/gattii NOT DETECTED NOT DETECTED Final   Methicillin resistance mecA/C DETECTED (A) NOT DETECTED Final    Comment: CRITICAL RESULT CALLED TO, READ BACK BY AND VERIFIED WITH: A. Grandville Silos RN 16:20 10/05/20 ([email or web address]) Performed at [facility], [place] [address], [place], [place] [address]   Culture, blood (Routine X 2) w Reflex to ID Panel     Status: Abnormal   Collection Time: 10/04/20 12:51 PM   Specimen: BLOOD RIGHT HAND  Result Value Ref Range Status   Specimen Description BLOOD RIGHT HAND  Final   Special Requests   Final    BOTTLES DRAWN AEROBIC AND ANAEROBIC Blood Culture adequate volume   Culture  Setup Time   Final    GRAM POSITIVE COCCI AEROBIC BOTTLE ONLY CRITICAL VALUE NOTED.  VALUE IS CONSISTENT WITH PREVIOUSLY REPORTED AND CALLED VALUE.    Culture (A)  Final    STAPHYLOCOCCUS EPIDERMIDIS SUSCEPTIBILITIES PERFORMED ON PREVIOUS CULTURE WITHIN THE LAST 5 DAYS. Performed at [facility], [place] [address] [place], [place] [address]    Report Status 10/07/2020 FINAL  Final  C Difficile Quick Screen (NO PCR Reflex)     Status: None   Collection Time: 10/04/20  5:19 PM   Specimen: STOOL  Result Value Ref Range Status   C Diff antigen NEGATIVE NEGATIVE Final   C Diff toxin NEGATIVE NEGATIVE Final   C Diff interpretation No C. difficile detected.  Final    Comment: Performed at [facility], [place] [address] [place], [place] [address]  Culture, respiratory (non-expectorated)     Status: None   Collection Time: 10/05/20 10:14 PM   Specimen: Tracheal Aspirate; Respiratory  Result Value Ref Range  Status   Specimen Description TRACHEAL ASPIRATE  Final   Special Requests NONE  Final   Gram  Stain   Final    MODERATE WBC PRESENT, PREDOMINANTLY PMN MODERATE GRAM NEGATIVE RODS FEW YEAST RARE GRAM POSITIVE COCCI IN CHAINS Performed at [facility], [place] [address], [place], [place] [address]    Culture MODERATE KLEBSIELLA PNEUMONIAE  Final   Report Status 10/08/2020 FINAL  Final   Organism ID, Bacteria KLEBSIELLA PNEUMONIAE  Final      Susceptibility   Klebsiella pneumoniae - MIC*    AMPICILLIN RESISTANT Resistant     CEFAZOLIN <=4 SENSITIVE Sensitive     CEFEPIME <=0.12 SENSITIVE Sensitive     CEFTAZIDIME <=1 SENSITIVE Sensitive     CEFTRIAXONE <=0.25 SENSITIVE Sensitive     CIPROFLOXACIN >=4 RESISTANT Resistant     GENTAMICIN <=1 SENSITIVE Sensitive     IMIPENEM <=0.25 SENSITIVE Sensitive     TRIMETH/SULFA <=20 SENSITIVE Sensitive     AMPICILLIN/SULBACTAM 8 SENSITIVE Sensitive     PIP/TAZO 16 SENSITIVE Sensitive     * MODERATE KLEBSIELLA PNEUMONIAE  Culture, blood (routine x 2)     Status: None   Collection Time: 10/08/20  4:17 PM   Specimen: BLOOD RIGHT WRIST  Result Value Ref Range Status   Specimen Description BLOOD RIGHT WRIST  Final   Special Requests   Final    BOTTLES DRAWN AEROBIC ONLY Blood Culture results may not be optimal due to an excessive volume of blood received in culture bottles   Culture   Final    NO GROWTH 5 DAYS Performed at [facility], [place] [address], [place], [place] [address]    Report Status 10/13/2020 FINAL  Final  Culture, blood (routine x 2)     Status: None   Collection Time: 10/08/20  4:17 PM   Specimen: BLOOD RIGHT HAND  Result Value Ref Range Status   Specimen Description BLOOD RIGHT HAND  Final   Special Requests   Final    BOTTLES DRAWN AEROBIC ONLY Blood Culture results may not be optimal due to an excessive volume of blood received in culture bottles   Culture   Final    NO GROWTH 5 DAYS Performed at [facility], [place] [address], [place], [place] [address]    Report Status 10/13/2020 FINAL  Final   Culture, respiratory (non-expectorated)     Status: None   Collection Time: 10/14/20  1:41 PM   Specimen: Tracheal Aspirate; Respiratory  Result Value Ref Range Status   Specimen Description TRACHEAL ASPIRATE  Final   Special Requests NONE  Final   Gram Stain   Final    RARE WBC PRESENT, PREDOMINANTLY PMN RARE GRAM NEGATIVE RODS    Culture   Final    RARE Normal respiratory flora-no Staph aureus or Pseudomonas seen Performed at [facility], [address], [place], [place] [address]    Report Status 10/17/2020 FINAL  Final  Culture, blood (routine x 2)     Status: None (Preliminary result)   Collection Time: 10/27/20  9:24 PM   Specimen: BLOOD  Result Value Ref Range Status   Specimen Description BLOOD RIGHT HAND  Final   Special Requests   Final    BOTTLES DRAWN AEROBIC ONLY Blood Culture adequate volume   Culture   Final    NO GROWTH 2 DAYS Performed at [facility], [place] [address], [place], [place] [address]    Report  Status PENDING  Incomplete  Culture, blood (routine x 2)     Status: None (Preliminary result)   Collection Time: 10/27/20  9:24 PM   Specimen: BLOOD  Result Value Ref Range Status   Specimen Description BLOOD RIGHT ARM  Final   Special Requests   Final    BOTTLES DRAWN AEROBIC ONLY Blood Culture adequate volume   Culture   Final    NO GROWTH 2 DAYS Performed at [facility], [address], [place], [place] [address]    Report Status PENDING  Incomplete    Coagulation Studies: No results for input(s): LABPROT, INR in the last 72 hours.  Urinalysis: No results for input(s): COLORURINE, LABSPEC, PHURINE, GLUCOSEU, HGBUR, BILIRUBINUR, KETONESUR, PROTEINUR, UROBILINOGEN, NITRITE, LEUKOCYTESUR in the last 72 hours.  Invalid input(s): APPERANCEUR    Imaging: No results found.   Medications:     iohexol, lidocaine  Assessment/ Plan:  54 y.o. 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   1.  ESRD on HD.  Patient due for hemodialysis treatment today.  He states that he is willing to undergo dialysis in a chair.  2.  Hypotension.  We will maintain the patient on albumin and midodrine for blood pressure support during dialysis treatments.  3.  Anemia of chronic kidney disease.   Lab Results  Component Value Date   HGB 7.6 (L) 10/27/2020  Continue Retacrit and monitoring of CBC.  4.  Secondary hyperparathyroidism.  Phosphorus 3.0 and at target.  5.  Acute respiratory failure.  Has done well status post decannulation.    LOS: 0 Jeremy Yu 11/8/20217:52 AM

## 2020-10-31 ENCOUNTER — Other Ambulatory Visit (HOSPITAL_COMMUNITY): Payer: Medicare Other

## 2020-10-31 HISTORY — PX: IR PARACENTESIS: IMG2679

## 2020-10-31 LAB — BODY FLUID CELL COUNT WITH DIFFERENTIAL: Total Nucleated Cell Count, Fluid: 8 cu mm (ref 0–1000)

## 2020-10-31 LAB — GRAM STAIN

## 2020-10-31 LAB — GLUCOSE, PLEURAL OR PERITONEAL FLUID: Glucose, Fluid: 51 mg/dL

## 2020-10-31 MED ORDER — LIDOCAINE HCL 1 % IJ SOLN
INTRAMUSCULAR | Status: AC
  Filled 2020-10-31: qty 20

## 2020-10-31 NOTE — Procedures (Signed)
PROCEDURE SUMMARY:  Successful image-guided paracentesis from the left lateral abdomen.  Yielded 100 milliliters of dark brown fluid.  No immediate complications.  EBL = 0 mL. Patient tolerated well.   Specimen was sent for labs.  Please see imaging section of Epic for full dictation.   Claris Pong Joslyne Marshburn PA-C 10/31/2020 3:17 PM

## 2020-11-01 LAB — BLOOD CULTURE ID PANEL (REFLEXED) - BCID2

## 2020-11-01 LAB — MAGNESIUM: Magnesium: 2.1 mg/dL (ref 1.7–2.4)

## 2020-11-01 LAB — CBC
HCT: 24.9 % — ABNORMAL LOW (ref 39.0–52.0)
Hemoglobin: 7.5 g/dL — ABNORMAL LOW (ref 13.0–17.0)
MCH: 29 pg (ref 26.0–34.0)
MCHC: 30.1 g/dL (ref 30.0–36.0)
MCV: 96.1 fL (ref 80.0–100.0)
Platelets: 867 10*3/uL — ABNORMAL HIGH (ref 150–400)
RBC: 2.59 MIL/uL — ABNORMAL LOW (ref 4.22–5.81)
RDW: 20.7 % — ABNORMAL HIGH (ref 11.5–15.5)
WBC: 19.2 10*3/uL — ABNORMAL HIGH (ref 4.0–10.5)
nRBC: 0.5 % — ABNORMAL HIGH (ref 0.0–0.2)

## 2020-11-01 LAB — CULTURE, BLOOD (ROUTINE X 2)
Culture: NO GROWTH
Special Requests: ADEQUATE

## 2020-11-01 LAB — RENAL FUNCTION PANEL
Albumin: 2.1 g/dL — ABNORMAL LOW (ref 3.5–5.0)
Anion gap: 10 (ref 5–15)
BUN: 62 mg/dL — ABNORMAL HIGH (ref 6–20)
CO2: 27 mmol/L (ref 22–32)
Calcium: 10.6 mg/dL — ABNORMAL HIGH (ref 8.9–10.3)
Chloride: 95 mmol/L — ABNORMAL LOW (ref 98–111)
Creatinine, Ser: 3.88 mg/dL — ABNORMAL HIGH (ref 0.61–1.24)
GFR, Estimated: 18 mL/min — ABNORMAL LOW (ref >60)
Glucose, Bld: 162 mg/dL — ABNORMAL HIGH (ref 70–99)
Phosphorus: 2.9 mg/dL (ref 2.5–4.6)
Potassium: 3.7 mmol/L (ref 3.5–5.1)
Sodium: 132 mmol/L — ABNORMAL LOW (ref 135–145)

## 2020-11-01 NOTE — Progress Notes (Signed)
[facility]  ROUNDING NOTE   Subjective:  Patient seen and evaluated at bedside. He will be undergoing dialysis later today. Has been tolerating dialysis well.   Objective:  Vital signs in last 24 hours:  Temperature 97 pulse 96 respirations 18 blood pressure 122/80   Physical Exam: General:  Chronically ill-appearing  Head:  Normocephalic, atraumatic. Moist oral mucosal membranes  Eyes:  Anicteric  Neck:  Supple  Lungs:   Scattered rhonchi, normal effort  Heart:  S1S2 no rubs  Abdomen:   Soft, nontender, bowel sounds present, PEG in place, colostomy  Extremities:  Right AKA  Neurologic:  Awake, alert, follows commands  Skin:  No lesions  Access:  Left upper extremity AV fistula    Basic Metabolic Panel: Recent Labs  Lab 10/27/20 0458 10/30/20 0619 11/01/20 0444  NA 133* 135 132*  K 3.5 3.9 3.7  CL 96* 97* 95*  CO2 $Re'26 27 27  'yRn$ GLUCOSE 93 146* 162*  BUN 52* 75* 62*  CREATININE 3.63* 4.49* 3.88*  CALCIUM 10.4* 10.8* 10.6*  MG 2.2 2.3 2.1  PHOS 2.6 3.0 2.9    Liver Function Tests: Recent Labs  Lab 10/27/20 0458 10/30/20 0619 11/01/20 0444  ALBUMIN 2.1* 2.1* 2.1*   No results for input(s): LIPASE, AMYLASE in the last 168 hours. No results for input(s): AMMONIA in the last 168 hours.  CBC: Recent Labs  Lab 10/27/20 0458 10/30/20 1022 11/01/20 0444  WBC 19.8* 18.0* 19.2*  HGB 7.6* 7.4* 7.5*  HCT 26.3* 24.7* 24.9*  MCV 97.0 96.9 96.1  PLT 762* 983* 867*    Cardiac Enzymes: No results for input(s): CKTOTAL, CKMB, CKMBINDEX, TROPONINI in the last 168 hours.  BNP: Invalid input(s): POCBNP  CBG: No results for input(s): GLUCAP in the last 168 hours.  Microbiology: Results for orders placed or performed during the hospital encounter of 09/22/20  Culture, respiratory (non-expectorated)     Status: None   Collection Time: 09/23/20 11:25 AM   Specimen: Tracheal Aspirate; Respiratory  Result Value Ref Range Status   Specimen Description  TRACHEAL ASPIRATE  Final   Special Requests NONE  Final   Gram Stain   Final    RARE WBC PRESENT,BOTH PMN AND MONONUCLEAR NO ORGANISMS SEEN Performed at [facility], [address], [place], [place] [address]    Culture FEW KLEBSIELLA PNEUMONIAE  Final   Report Status 09/25/2020 FINAL  Final   Organism ID, Bacteria KLEBSIELLA PNEUMONIAE  Final      Susceptibility   Klebsiella pneumoniae - MIC*    AMPICILLIN >=32 RESISTANT Resistant     CEFAZOLIN <=4 SENSITIVE Sensitive     CEFEPIME 0.25 SENSITIVE Sensitive     CEFTAZIDIME <=1 SENSITIVE Sensitive     CEFTRIAXONE 1 SENSITIVE Sensitive     CIPROFLOXACIN <=0.25 SENSITIVE Sensitive     GENTAMICIN <=1 SENSITIVE Sensitive     IMIPENEM <=0.25 SENSITIVE Sensitive     TRIMETH/SULFA <=20 SENSITIVE Sensitive     AMPICILLIN/SULBACTAM 16 INTERMEDIATE Intermediate     PIP/TAZO 64 INTERMEDIATE Intermediate     * FEW KLEBSIELLA PNEUMONIAE  Culture, blood (routine x 2)     Status: None   Collection Time: 09/23/20  4:26 PM   Specimen: BLOOD  Result Value Ref Range Status   Specimen Description BLOOD BLOOD RIGHT HAND  Final   Special Requests   Final    AEROBIC BOTTLE ONLY Blood Culture results may not be optimal due to an inadequate volume of blood received in  culture bottles   Culture   Final    NO GROWTH 5 DAYS Performed at [facility], [place] [address], [place], [place] [address]    Report Status 09/28/2020 FINAL  Final  Culture, blood (single)     Status: None   Collection Time: 09/24/20  6:00 AM   Specimen: BLOOD RIGHT HAND  Result Value Ref Range Status   Specimen Description BLOOD RIGHT HAND  Final   Special Requests   Final    BOTTLES DRAWN AEROBIC ONLY Blood Culture results may not be optimal due to an inadequate volume of blood received in culture bottles   Culture   Final    NO GROWTH 5 DAYS Performed at [facility], [place] [address], [place], [place] [address]    Report Status 09/29/2020 FINAL  Final  Body  fluid culture     Status: None   Collection Time: 09/29/20 12:27 PM   Specimen: PATH Cytology Peritoneal fluid  Result Value Ref Range Status   Specimen Description PERITONEAL FLUID  Final   Special Requests NONE  Final   Gram Stain   Final    WBC PRESENT,BOTH PMN AND MONONUCLEAR NO ORGANISMS SEEN CYTOSPIN SMEAR    Culture   Final    NO GROWTH 3 DAYS Performed at [facility], [address], [place], [place] [address]    Report Status 10/03/2020 FINAL  Final  Culture, blood (Routine X 2) w Reflex to ID Panel     Status: Abnormal   Collection Time: 10/04/20 12:50 PM   Specimen: BLOOD  Result Value Ref Range Status   Specimen Description BLOOD RIGHT ANTECUBITAL  Final   Special Requests   Final    BOTTLES DRAWN AEROBIC AND ANAEROBIC Blood Culture adequate volume   Culture  Setup Time   Final    GRAM POSITIVE COCCI IN CLUSTERS AEROBIC BOTTLE ONLY CRITICAL RESULT CALLED TO, READ BACK BY AND VERIFIED WITH: A. Grandville Silos RN 16:20 10/05/20 ([email or web address]) Performed at [facility], [place] [address] [place], [place] [address]    Culture STAPHYLOCOCCUS EPIDERMIDIS (A)  Final   Report Status 10/07/2020 FINAL  Final   Organism ID, Bacteria STAPHYLOCOCCUS EPIDERMIDIS  Final      Susceptibility   Staphylococcus epidermidis - MIC*    CIPROFLOXACIN >=8 RESISTANT Resistant     ERYTHROMYCIN >=8 RESISTANT Resistant     GENTAMICIN >=16 RESISTANT Resistant     OXACILLIN >=4 RESISTANT Resistant     TETRACYCLINE 2 SENSITIVE Sensitive     VANCOMYCIN 2 SENSITIVE Sensitive     TRIMETH/SULFA 160 RESISTANT Resistant     CLINDAMYCIN >=8 RESISTANT Resistant     RIFAMPIN <=0.5 SENSITIVE Sensitive     Inducible Clindamycin NEGATIVE Sensitive     * STAPHYLOCOCCUS EPIDERMIDIS  Blood Culture ID Panel (Reflexed)     Status: Abnormal   Collection Time: 10/04/20 12:50 PM  Result Value Ref Range Status   Enterococcus faecalis NOT DETECTED NOT DETECTED Final   Enterococcus Faecium NOT DETECTED NOT  DETECTED Final   Listeria monocytogenes NOT DETECTED NOT DETECTED Final   Staphylococcus species DETECTED (A) NOT DETECTED Final    Comment: CRITICAL RESULT CALLED TO, READ BACK BY AND VERIFIED WITH: A. Grandville Silos RN 16:20 10/05/20 ([email or web address])    Staphylococcus aureus (BCID) NOT DETECTED NOT DETECTED Final   Staphylococcus epidermidis DETECTED (A) NOT DETECTED Final    Comment: Methicillin (oxacillin) resistant coagulase negative staphylococcus. Possible blood culture contaminant (unless isolated from more  than one blood culture draw or clinical case suggests pathogenicity). No antibiotic treatment is indicated for blood  culture contaminants. CRITICAL RESULT CALLED TO, READ BACK BY AND VERIFIED WITH: A. Grandville Silos RN 16:20 10/05/20 ([email or web address])    Staphylococcus lugdunensis NOT DETECTED NOT DETECTED Final   Streptococcus species NOT DETECTED NOT DETECTED Final   Streptococcus agalactiae NOT DETECTED NOT DETECTED Final   Streptococcus pneumoniae NOT DETECTED NOT DETECTED Final   Streptococcus pyogenes NOT DETECTED NOT DETECTED Final   A.calcoaceticus-baumannii NOT DETECTED NOT DETECTED Final   Bacteroides fragilis NOT DETECTED NOT DETECTED Final   Enterobacterales NOT DETECTED NOT DETECTED Final   Enterobacter cloacae complex NOT DETECTED NOT DETECTED Final   Escherichia coli NOT DETECTED NOT DETECTED Final   Klebsiella aerogenes NOT DETECTED NOT DETECTED Final   Klebsiella oxytoca NOT DETECTED NOT DETECTED Final   Klebsiella pneumoniae NOT DETECTED NOT DETECTED Final   Proteus species NOT DETECTED NOT DETECTED Final   Salmonella species NOT DETECTED NOT DETECTED Final   Serratia marcescens NOT DETECTED NOT DETECTED Final   Haemophilus influenzae NOT DETECTED NOT DETECTED Final   Neisseria meningitidis NOT DETECTED NOT DETECTED Final   Pseudomonas aeruginosa NOT DETECTED NOT DETECTED Final   Stenotrophomonas maltophilia NOT DETECTED NOT DETECTED Final   Candida albicans NOT DETECTED NOT  DETECTED Final   Candida auris NOT DETECTED NOT DETECTED Final   Candida glabrata NOT DETECTED NOT DETECTED Final   Candida krusei NOT DETECTED NOT DETECTED Final   Candida parapsilosis NOT DETECTED NOT DETECTED Final   Candida tropicalis NOT DETECTED NOT DETECTED Final   Cryptococcus neoformans/gattii NOT DETECTED NOT DETECTED Final   Methicillin resistance mecA/C DETECTED (A) NOT DETECTED Final    Comment: CRITICAL RESULT CALLED TO, READ BACK BY AND VERIFIED WITH: A. Grandville Silos RN 16:20 10/05/20 ([email or web address]) Performed at [facility], [place] [address], [place], [place] [address]   Culture, blood (Routine X 2) w Reflex to ID Panel     Status: Abnormal   Collection Time: 10/04/20 12:51 PM   Specimen: BLOOD RIGHT HAND  Result Value Ref Range Status   Specimen Description BLOOD RIGHT HAND  Final   Special Requests   Final    BOTTLES DRAWN AEROBIC AND ANAEROBIC Blood Culture adequate volume   Culture  Setup Time   Final    GRAM POSITIVE COCCI AEROBIC BOTTLE ONLY CRITICAL VALUE NOTED.  VALUE IS CONSISTENT WITH PREVIOUSLY REPORTED AND CALLED VALUE.    Culture (A)  Final    STAPHYLOCOCCUS EPIDERMIDIS SUSCEPTIBILITIES PERFORMED ON PREVIOUS CULTURE WITHIN THE LAST 5 DAYS. Performed at [facility], [place] [address], [place],  [address]    Report Status 10/07/2020 FINAL  Final  C Difficile Quick Screen (NO PCR Reflex)     Status: None   Collection Time: 10/04/20  5:19 PM   Specimen: STOOL  Result Value Ref Range Status   C Diff antigen NEGATIVE NEGATIVE Final   C Diff toxin NEGATIVE NEGATIVE Final   C Diff interpretation No C. difficile detected.  Final    Comment: Performed at [facility], [place] [address] [place],  [address]  Culture, respiratory (non-expectorated)     Status: None   Collection Time: 10/05/20 10:14 PM   Specimen: Tracheal Aspirate; Respiratory  Result Value Ref Range Status   Specimen Description TRACHEAL ASPIRATE  Final   Special Requests  NONE  Final   Gram Stain   Final    MODERATE WBC PRESENT, PREDOMINANTLY PMN MODERATE  GRAM NEGATIVE RODS FEW YEAST RARE GRAM POSITIVE COCCI IN CHAINS Performed at [facility], [place] [address] [place], [place] [address]    Culture MODERATE KLEBSIELLA PNEUMONIAE  Final   Report Status 10/08/2020 FINAL  Final   Organism ID, Bacteria KLEBSIELLA PNEUMONIAE  Final      Susceptibility   Klebsiella pneumoniae - MIC*    AMPICILLIN RESISTANT Resistant     CEFAZOLIN <=4 SENSITIVE Sensitive     CEFEPIME <=0.12 SENSITIVE Sensitive     CEFTAZIDIME <=1 SENSITIVE Sensitive     CEFTRIAXONE <=0.25 SENSITIVE Sensitive     CIPROFLOXACIN >=4 RESISTANT Resistant     GENTAMICIN <=1 SENSITIVE Sensitive     IMIPENEM <=0.25 SENSITIVE Sensitive     TRIMETH/SULFA <=20 SENSITIVE Sensitive     AMPICILLIN/SULBACTAM 8 SENSITIVE Sensitive     PIP/TAZO 16 SENSITIVE Sensitive     * MODERATE KLEBSIELLA PNEUMONIAE  Culture, blood (routine x 2)     Status: None   Collection Time: 10/08/20  4:17 PM   Specimen: BLOOD RIGHT WRIST  Result Value Ref Range Status   Specimen Description BLOOD RIGHT WRIST  Final   Special Requests   Final    BOTTLES DRAWN AEROBIC ONLY Blood Culture results may not be optimal due to an excessive volume of blood received in culture bottles   Culture   Final    NO GROWTH 5 DAYS Performed at [facility], [place] [address] [place], [place] [address]    Report Status 10/13/2020 FINAL  Final  Culture, blood (routine x 2)     Status: None   Collection Time: 10/08/20  4:17 PM   Specimen: BLOOD RIGHT HAND  Result Value Ref Range Status   Specimen Description BLOOD RIGHT HAND  Final   Special Requests   Final    BOTTLES DRAWN AEROBIC ONLY Blood Culture results may not be optimal due to an excessive volume of blood received in culture bottles   Culture   Final    NO GROWTH 5 DAYS Performed at [facility], [place] [address] [place], [place] [address]    Report Status 10/13/2020  FINAL  Final  Culture, respiratory (non-expectorated)     Status: None   Collection Time: 10/14/20  1:41 PM   Specimen: Tracheal Aspirate; Respiratory  Result Value Ref Range Status   Specimen Description TRACHEAL ASPIRATE  Final   Special Requests NONE  Final   Gram Stain   Final    RARE WBC PRESENT, PREDOMINANTLY PMN RARE GRAM NEGATIVE RODS    Culture   Final    RARE Normal respiratory flora-no Staph aureus or Pseudomonas seen Performed at [facility], [address] [place], [place] [address]    Report Status 10/17/2020 FINAL  Final  Culture, blood (routine x 2)     Status: None   Collection Time: 10/27/20  9:24 PM   Specimen: BLOOD  Result Value Ref Range Status   Specimen Description BLOOD RIGHT HAND  Final   Special Requests   Final    BOTTLES DRAWN AEROBIC ONLY Blood Culture adequate volume   Culture   Final    NO GROWTH 5 DAYS Performed at [facility], [place] [address] [place], [place] [address]    Report Status 11/01/2020 FINAL  Final  Culture, blood (routine x 2)     Status: None   Collection Time: 10/27/20  9:24 PM   Specimen: BLOOD  Result Value Ref Range Status  Specimen Description BLOOD RIGHT ARM  Final   Special Requests   Final    BOTTLES DRAWN AEROBIC ONLY Blood Culture adequate volume   Culture   Final    NO GROWTH 5 DAYS Performed at [facility], [address], [place], [place] [address]    Report Status 11/01/2020 FINAL  Final  Gram stain     Status: None   Collection Time: 10/31/20  3:16 PM   Specimen: Abdomen; Peritoneal Fluid  Result Value Ref Range Status   Specimen Description FLUID PERITONEAL ABDOMEN  Final   Special Requests NONE  Final   Gram Stain   Final    RARE WBC PRESENT,BOTH PMN AND MONONUCLEAR NO ORGANISMS SEEN Performed at [facility], [place] [address], [place], [place] [address]    Report Status 10/31/2020 FINAL  Final  Culture, body fluid-bottle     Status: None (Preliminary result)   Collection  Time: 10/31/20  3:16 PM   Specimen: Fluid  Result Value Ref Range Status   Specimen Description FLUID PERITONEAL ABDOMEN  Final   Special Requests BOTTLES DRAWN AEROBIC AND ANAEROBIC  Final   Culture   Final    NO GROWTH < 12 HOURS Performed at [facility], [place] [address] [place], [place] [address]    Report Status PENDING  Incomplete    Coagulation Studies: No results for input(s): LABPROT, INR in the last 72 hours.  Urinalysis: No results for input(s): COLORURINE, LABSPEC, PHURINE, GLUCOSEU, HGBUR, BILIRUBINUR, KETONESUR, PROTEINUR, UROBILINOGEN, NITRITE, LEUKOCYTESUR in the last 72 hours.  Invalid input(s): APPERANCEUR    Imaging: CT ABDOMEN PELVIS WO CONTRAST  Result Date: 10/30/2020 CLINICAL DATA:  54 year old male with abdominal pain. Intra-abdominal abscess. EXAM: CT ABDOMEN AND PELVIS WITHOUT CONTRAST TECHNIQUE: Multidetector CT imaging of the abdomen and pelvis was performed following the standard protocol without IV contrast. COMPARISON:  CT abdomen pelvis dated 10/05/2020. FINDINGS: Evaluation of this exam is limited in the absence of intravenous contrast. Lower chest: There is a patchy area of consolidation at the right lung base which may represent atelectasis or infiltrate. There is a 2.9 x 2.2 cm rounded density in the lingula as seen on the prior CT. Partially visualized central venous line with tip in the region of the right atrium. No intra-abdominal free air. Hepatobiliary: The liver is unremarkable. No intrahepatic biliary ductal dilatation. There is a percutaneous cholecystostomy with pigtail tip in the gallbladder. Several small stones noted in the gallbladder. There is a small subhepatic fluid, possibly subcapsular. Pancreas: No acute findings. No inflammatory changes. No dilatation of the main pancreatic duct. Spleen: Normal in size without focal abnormality. Adrenals/Urinary Tract: The adrenal glands unremarkable. Moderate bilateral renal parenchyma atrophy.  There is no hydronephrosis or nephrolithiasis on either side. Renal vascular calcifications noted. The visualized ureters and urinary bladder appear unremarkable. Stomach/Bowel: There is a percutaneous gastrostomy with balloon in the body of the stomach. Metallic clips again noted in the body of the stomach. The stomach is distended. Postsurgical changes of colectomy with a right lower quadrant ostomy. There is no bowel obstruction. Vascular/Lymphatic: The abdominal aorta and IVC are unremarkable. There is atherosclerotic calcification of the mesenteric vessels. No portal venous gas. There is no adenopathy. Reproductive: The prostate gland is not well visualized. Other: There is a large loculated fluid collection in the lower abdomen with several small pockets of air. This collection measures approximately 28 x 12 cm in greatest axial dimensions and 14 cm in craniocaudal length. This  collection is relatively similar in size but appears more loculated compared to the prior CT and concerning for developing infection. Clinical correlation is recommended. Musculoskeletal: Midline vertical anterior pelvic wall surgical incision. There is fatty atrophy of the musculature of the pelvic girdle. There is diffuse increased osseous density consistent with osseous sclerosis. No acute osseous pathology. IMPRESSION: 1. Large loculated fluid collection in the lower abdomen concerning for developing infection. Clinical correlation is recommended. 2. Percutaneous cholecystostomy with pigtail tip in the gallbladder. Several small gallstones. 3. Postsurgical changes of colectomy with a right lower quadrant ostomy. No bowel obstruction. 4. Osseous sclerosis likely related to chronic kidney disease. 5. Aortic Atherosclerosis (ICD10-I70.0). Electronically Signed   By: Anner Crete M.D.   On: 10/30/2020 20:54   VAS [place] LOWER EXTREMITY VENOUS (DVT)  Result Date: 10/31/2020  Lower Venous DVT Study Indications: Swelling.  Risk  Factors: Immobility. Performing Technologist: Griffin Basil RCT RDMS  Examination Guidelines: A complete evaluation includes B-mode imaging, spectral Doppler, color Doppler, and power Doppler as needed of all accessible portions of each vessel. Bilateral testing is considered an integral part of a complete examination. Limited examinations for reoccurring indications may be performed as noted. The reflux portion of the exam is performed with the patient in reverse Trendelenburg.  +-------+---------------+---------+-----------+----------+--------------+ RIGHT  CompressibilityPhasicitySpontaneityPropertiesThrombus Aging +-------+---------------+---------+-----------+----------+--------------+ CFV    Full           Yes      Yes                                 +-------+---------------+---------+-----------+----------+--------------+ SFJ    Full                                                        +-------+---------------+---------+-----------+----------+--------------+ FV ProxFull                                                        +-------+---------------+---------+-----------+----------+--------------+ FV Mid Full                                                        +-------+---------------+---------+-----------+----------+--------------+ AKA 07/13/2020  +---------+---------------+---------+-----------+----------+--------------+ LEFT     CompressibilityPhasicitySpontaneityPropertiesThrombus Aging +---------+---------------+---------+-----------+----------+--------------+ CFV      Full           Yes      Yes                                 +---------+---------------+---------+-----------+----------+--------------+ SFJ      Full                                                        +---------+---------------+---------+-----------+----------+--------------+ FV Prox  Full                                                         +---------+---------------+---------+-----------+----------+--------------+  FV Mid   Full                                                        +---------+---------------+---------+-----------+----------+--------------+ FV DistalFull                                                        +---------+---------------+---------+-----------+----------+--------------+ PFV      Full                                                        +---------+---------------+---------+-----------+----------+--------------+ POP      Full           Yes      Yes                                 +---------+---------------+---------+-----------+----------+--------------+ PTV      Full                                                        +---------+---------------+---------+-----------+----------+--------------+ PERO     Full                                                        +---------+---------------+---------+-----------+----------+--------------+     Summary: RIGHT: - There is no evidence of deep vein thrombosis in the lower extremity.  LEFT: - There is no evidence of deep vein thrombosis in the lower extremity.  - No cystic structure found in the popliteal fossa.  *See table(s) above for measurements and observations. Electronically signed by Curt Jews MD on 10/31/2020 at 2:52:24 PM.    Final    IR Paracentesis  Result Date: 10/31/2020 INDICATION: Patient with history of abdominal distension and recurrent loculated ascites. Request is made for diagnostic and therapeutic paracentesis. EXAM: ULTRASOUND GUIDED DIAGNOSTIC AND THERAPEUTIC PARACENTESIS MEDICATIONS: 10 mL 1% lidocaine COMPLICATIONS: None immediate. PROCEDURE: Informed written consent was obtained from the patient after a discussion of the risks, benefits and alternatives to treatment. A timeout was performed prior to the initiation of the procedure. Initial ultrasound scanning demonstrates a moderate amount of complex,  loculated ascites within the left lower abdominal quadrant. The left lower abdomen was prepped and draped in the usual sterile fashion. 1% lidocaine was used for local anesthesia. Following this, a 19 gauge, 7-cm, Yueh catheter was introduced. An ultrasound image was saved for documentation purposes. The paracentesis was performed. The catheter was removed and a dressing was applied. The patient tolerated the procedure well without immediate post procedural complication. FINDINGS: A total of approximately 100 mL of dark brown fluid was removed. Samples were  sent to the laboratory as requested by the clinical team. IMPRESSION: Successful ultrasound-guided paracentesis yielding 100 mL of peritoneal fluid. Read by: Earley Abide, PA-C Electronically Signed   By: Jacqulynn Cadet M.D.   On: 10/31/2020 15:20     Medications:     iohexol, lidocaine  Assessment/ Plan:  54 y.o. 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   1.  ESRD on HD.  Maintain the patient on MWF dialysis schedule.  2.  Hypotension.  Continue albumin and midodrine support..  3.  Anemia of chronic kidney disease.   Lab Results  Component Value Date   HGB 7.5 (L) 11/01/2020  Hemoglobin currently 7.5.  No immediate need for transfusion.  Maintain the patient on Retacrit.  4.  Secondary hyperparathyroidism.  Phosphorus remains under target of 2.9.  Continue to periodically monitor bone mineral metabolism parameters.  5.  Acute respiratory failure.  Stable respiratory status status post  decannulation.    LOS: 0 Jeremy Yu 11/10/20219:46 AM

## 2020-11-02 ENCOUNTER — Other Ambulatory Visit (HOSPITAL_COMMUNITY): Payer: Medicare Other

## 2020-11-02 NOTE — Progress Notes (Signed)
PROGRESS NOTE    Jeremy Yu  HCW:[id number] DOB: 1966-04-15 DOA: 09/22/2020  Brief Narrative:  Jeremy Yu is an 54 y.o. male with medical history significant of end-stage renal disease on hemodialysis, diabetic nephropathy, chronic systolic and diastolic congestive heart failure, peripheral arterial disease, complete heart block, seizures, hyperlipidemia, hypertension, prior MI, GERD who had recent hospitalization from 07/17/2020 until 07/25/2020 for necrotizing soft tissue infection, sepsis.  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  He was discharged on 07/25/2020.  Patient apparently was at [facility] and rehab facility.  While undergoing dialysis on 08/20/2019 when he developed chills, rigors.  Blood cultures were taken and he was admitted to [facility] on 08/19/2020.  He was given IV vancomycin, ceftriaxone for probable sepsis.  He was found to have diarrhea and stool for C. difficile was positive.  Patient was placed on oral vancomycin.  CT on 08/20/2020 confirmed pancolitis with large left inguinal hernia, airspace disease with concern for pneumonia.  On 08/22/2019 when he became hypotensive, both IV and p.o. Flagyl was added to the p.o. vancomycin.  Infectious disease was consulted with addition of Dificid.  However, patient continued to worsen.  On 08/24/2019 when he was transferred urgently to the ICU and intubated for airway protection because he was becoming encephalopathic.  He was also given vasopressors for shock.  Fecal transplant was attempted by GI at the bedside however, inability to pass the scope due to large nonreducible inguinal hernia.  Surgery was consulted and patient was taken emergently for subtotal colectomy and ileostomy.  Eravacycline was added for C. difficile/toxic megacolon.   He was seen by surgery with clearance for tube feeds on 08/25/2020.  Patient postoperatively remained encephalopathic.  Gradually mental status improved.  However, he had significant drop in platelet count on 08/27/2020.  Hematology was consulted and they thought the thrombocytopenia was likely secondary to sepsis, recent surgery, critical illness, DIC.  He was given platelet transfusion with additional PRBC due to oozing from the surgical site, drop in hemoglobin levels.  He was also given vitamin K for ongoing liver dysfunction.  He apparently received total of 8 units platelets.  Repeat CT was done which showed left lingular consolidation, bilateral effusions.  Abdomen showed postop free fluid, gallstones.  He had progressive thrombocytopenia, abnormal LFTs therefore Flagyl, Eravacycline was stopped.  He had blood cultures on 08/28/2020 that showed staph.  He was treated with empiric antibiotics.  He was also treated with micafungin. On 09/04/2020 patient had PEA arrest during which she was intubated, subsequently underwent EGD with evidence of bleeding ulcer, gastritis.  He continued to have oozing from his ileostomy and was receiving blood products.  He was placed on PPI drip.  Right upper quadrant ultrasound showed possible cholecystitis.  He was to undergo drain placement but underwent HIDA on 09/09/2020, gallbladder was not visualized.  On 09/11/2019 when he had large amount of blood from the stoma and received PRBC.  He had EGD with bleeding ulcer that was clipped.  On 09/12/2019 when he had paracentesis with 5.2 L removed.  Patient had fluid cultures grew VRE for which he was treated with daptomycin for 14 days.  He failed ventilator weaning efforts therefore tracheostomy was done on 09/14/2020.  He has unstageable sacral pressure ulcer for which he received wound care. He was transferred and admitted to [facility] on 09/22/2020. -  He previously had a trach but now decannulated. He has received  multiple antibiotics for Klebsiella pneumonia and also for worsening leukocytosis with fevers.   -As soon as antibiotics are discontinued he starts having fevers along with worsening leukocytosis.  He recently completed empiric treatment with meropenem. -WBC count worsening again with low-grade fevers.  His blood cultures from 10/27/2020 showing Candida glabrata. He has a central line in place.  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  He had severe sepsis with septic shock at the acute facility.  He also had PEA arrest at the outside facility and had to be intubated.  He had pneumonia with Klebsiella.  He received treatment with multiple rounds of antibiotics including IV vancomycin, meropenem, Flagyl.  He is currently decannulated. Respiratory status appears to be stable at this time.   Sepsis with shock: Patient was on pressors at the outside facility. Shock is resolved.  He already completed multiple rounds of antibiotics.  However, as soon as antibiotics are stopped he starts having fever with worsening leukocytosis. He is high risk for recurrent sepsis.  He recently received treatment with IV meropenem, Flagyl.  He is now having leukocytosis with low-grade fevers again.  He has a central line in place.  His blood cultures from 10/27/2020 showing Candida glabrata.  Requested the lab to set up susceptibilities.  He  started on fluconazole.  Would recommend to switch to Eraxis until we have the susceptibility results. Recommend to remove the central line once we are able to get a peripheral IV. I am told that the patient is a very hard stick.    Candidemia: As mentioned above blood cultures from 10/27/2020 showing Candida glabrata.  He is currently on fluconazole.  Would recommend to switch to Eraxis.  Recommend to remove the central line.  Requested lab to set up susceptibilities for the Candida.  Repeat blood cultures to document clearance.  Leukocytosis: Treated with multiple rounds of antibiotics as mentioned above.  He completed treatment with IV meropenem, p.o. Flagyl.  WBC count improved initially but now worsening again with low-grade fevers. His blood cultures from 10/27/2020 showing Candida glabrata.  Switching to Eraxis.  Continue to monitor WBC count. He is high risk for recurrent sepsis.  He has a central line in place.  Ideally recommend to obtain a peripheral IV and remove the central line.  However, patient apparently is a very hard stick.    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 Continue local wound care.  Antibiotics as mentioned above.  He is on on p.o. vancomycin to prevent recurrent C. difficile infection.  Peritonitis: Patient had peritonitis at the acute facility with peritoneal fluid cultures that showed VRE.  He status post treatment with 14 days of daptomycin.  CT of the abdomen and pelvis from 10/30/2020 showed fluid collection suspected to be ascites. He is status post paracentesis with removal of 100 mL of dark brown fluid.  Fluid results still pending at this time. He received treatment with empiric antibiotics  due to worsening leukocytosis. He is at high risk for recurrent peritonitis. Continue to monitor closely.  Repeat abdominal imaging ordered.  Cholecystitis: He also had cholecystitis and is status post drain placement.   Antibiotics as mentioned above.  End-stage renal disease on dialysis: Dialysis per nephrology.  Antibiotics renally dosed.  Necrotizing fasciitis status post right AKA: Continue local wound care.  Diabetes mellitus: Continue to monitor Accu-Cheks, medications and management of diabetes per the primary team.  He will need proper glycemic control in order to enable healing.  Bleeding ulcer/gastritis: Status post EGD and clipping at the acute facility.  On PPI.  Continue to monitor.  Further management per primary team.  Sacrococcygeal pressure ulcer unstageable: Continue local wound care.  If worsening consider consulting surgery for possible debridement.  Thrombocytosis: Patient having elevated platelet count.  Continue to monitor.  Unfortunately unable to give aspirin because he also had GI bleed.  Anemia: Continue to monitor hemoglobin.  Further management per the primary team.  Unfortunately due to his complex medical problems he is high risk for worsening and decompensation.  Plan of care discussed with the patient, primary team and pharmacy.  Subjective: WBC count worsening again.  He is having low-grade fevers again.  Blood cultures showing Candida glabrata.  Objective: Vitals: Temperature 98.1, pulse 93, respiratory rate 30, blood pressure 127/74, pulse oximetry 100%  Examination: Constitutional:  Awake, not in any acute distress Head: Atraumatic, normocephalic Eyes: pupils equal and reactive, legally blind  ENMT: external ears and nose appear normal, normal hearing, Lips appear normal, moist oral mucosa Neck:  Decannulated, supple CVS: S1-S2  Respiratory:  Decreased breath sound lower lobes, otherwise clear to auscultation Abdomen:  Soft, nontender, wound VAC in place, right upper quadrant drain, ostomy, PEG tube Musculoskeletal: Right lower extremity AKA, left upper extremity AV fistula Neuro: Legally blind, grossly nonfocal Psych: stable mood Skin: no rashes    Data Reviewed: I have personally reviewed following labs and imaging studies  CBC: Recent Labs  Lab 10/27/20 0458 10/30/20 1022 11/01/20 0444  WBC 19.8* 18.0* 19.2*  HGB 7.6* 7.4* 7.5*  HCT 26.3* 24.7* 24.9*  MCV 97.0 96.9 96.1  PLT 762* 983* 867*    Basic Metabolic Panel: Recent Labs  Lab 10/27/20 0458 10/30/20 0619 11/01/20 0444  NA 133* 135 132*  K 3.5 3.9 3.7  CL 96* 97* 95*  CO2 $Re'26 27 27  'AzX$ GLUCOSE 93 146* 162*  BUN 52* 75* 62*  CREATININE 3.63* 4.49* 3.88*  CALCIUM 10.4* 10.8* 10.6*  MG 2.2 2.3 2.1  PHOS 2.6 3.0 2.9    GFR: CrCl cannot be calculated (Unknown ideal weight.).  Liver Function Tests: Recent Labs  Lab 10/27/20 0458 10/30/20 0619 11/01/20 0444  ALBUMIN 2.1* 2.1* 2.1*    CBG: No results for input(s): GLUCAP in the last 168 hours.   Recent Results (from the past 240 hour(s))  Culture, blood (routine x 2)     Status: Abnormal (Preliminary result)   Collection Time: 10/27/20  9:24 PM   Specimen: BLOOD  Result Value Ref Range Status   Specimen Description BLOOD RIGHT HAND  Final   Special Requests   Final    BOTTLES DRAWN AEROBIC ONLY Blood Culture adequate volume   Culture  Setup Time   Final    Organism ID to follow YEAST AEROBIC BOTTLE ONLY CRITICAL RESULT CALLED TO, READ BACK BY AND VERIFIED WITH: RN D [place] [id number] AT 1400 BY CM Performed at [facility], [place] [address],  [place], [place] [address]    Culture CANDIDA GLABRATA (A)  Final   Report Status PENDING  Incomplete  Culture, blood (routine x 2)     Status: None   Collection Time: 10/27/20  9:24 PM   Specimen: BLOOD  Result Value Ref Range Status   Specimen Description BLOOD RIGHT ARM  Final   Special Requests   Final    BOTTLES DRAWN AEROBIC ONLY Blood Culture adequate volume   Culture   Final    NO GROWTH 5 DAYS Performed at [facility], [address] [place], [place] [address]    Report Status 11/01/2020 FINAL  Final  Blood Culture ID Panel  (Reflexed)     Status: Abnormal   Collection Time: 10/27/20  9:24 PM  Result Value Ref Range Status   Enterococcus faecalis NOT DETECTED NOT DETECTED Final   Enterococcus Faecium NOT DETECTED NOT DETECTED Final   Listeria monocytogenes NOT DETECTED NOT DETECTED Final   Staphylococcus species NOT DETECTED NOT DETECTED Final   Staphylococcus aureus (BCID) NOT DETECTED NOT DETECTED Final   Staphylococcus epidermidis NOT DETECTED NOT DETECTED Final   Staphylococcus lugdunensis NOT DETECTED NOT DETECTED Final   Streptococcus species NOT DETECTED NOT DETECTED Final   Streptococcus agalactiae NOT DETECTED NOT DETECTED Final   Streptococcus pneumoniae NOT DETECTED NOT DETECTED Final   Streptococcus pyogenes NOT DETECTED NOT DETECTED Final   A.calcoaceticus-baumannii NOT DETECTED NOT DETECTED Final   Bacteroides fragilis NOT DETECTED NOT DETECTED Final   Enterobacterales NOT DETECTED NOT DETECTED Final   Enterobacter cloacae complex NOT DETECTED NOT DETECTED Final   Escherichia coli NOT DETECTED NOT DETECTED Final   Klebsiella aerogenes NOT DETECTED NOT DETECTED Final   Klebsiella oxytoca NOT DETECTED NOT DETECTED Final   Klebsiella pneumoniae NOT DETECTED NOT DETECTED Final   Proteus species NOT DETECTED NOT DETECTED Final   Salmonella species NOT DETECTED NOT DETECTED Final   Serratia marcescens NOT DETECTED NOT DETECTED Final   Haemophilus influenzae NOT DETECTED NOT DETECTED Final   Neisseria meningitidis NOT DETECTED NOT DETECTED Final   Pseudomonas aeruginosa NOT DETECTED NOT DETECTED Final   Stenotrophomonas maltophilia NOT DETECTED NOT DETECTED Final   Candida albicans NOT DETECTED NOT DETECTED Final   Candida auris NOT DETECTED NOT DETECTED Final   Candida glabrata DETECTED (A) NOT DETECTED Final    Comment: CRITICAL RESULT CALLED TO, READ BACK BY AND VERIFIED WITH: PHARMD M MITCHELL [id number] AT 1350 BY CM    Candida krusei NOT DETECTED NOT DETECTED Final   Candida parapsilosis NOT  DETECTED NOT DETECTED Final   Candida tropicalis NOT DETECTED NOT DETECTED Final   Cryptococcus neoformans/gattii NOT DETECTED NOT DETECTED Final    Comment: Performed at [facility], [address], [place], [place] [address]  Gram stain     Status: None   Collection Time: 10/31/20  3:16 PM   Specimen: Abdomen; Peritoneal Fluid  Result Value Ref Range Status   Specimen Description FLUID PERITONEAL ABDOMEN  Final   Special Requests NONE  Final   Gram Stain   Final    RARE WBC PRESENT,BOTH PMN AND MONONUCLEAR NO ORGANISMS SEEN Performed at [facility], [address], [place], [place] [address]    Report Status 10/31/2020 FINAL  Final  Culture, body fluid-bottle     Status: None (Preliminary result)   Collection Time: 10/31/20  3:16 PM   Specimen: Fluid  Result Value Ref Range Status   Specimen Description FLUID PERITONEAL ABDOMEN  Final   Special Requests BOTTLES DRAWN AEROBIC AND ANAEROBIC  Final   Culture   Final    NO GROWTH 2 DAYS Performed at [facility], [place] [address] [place], [place] [address]    Report Status PENDING  Incomplete      Radiology Studies: No results found.  Scheduled Meds: Please see MAR   Yaakov Guthrie, MD  11/02/2020, 3:31 PM

## 2020-11-03 ENCOUNTER — Other Ambulatory Visit (HOSPITAL_COMMUNITY): Payer: Medicare Other

## 2020-11-03 LAB — RENAL FUNCTION PANEL
Albumin: 2.3 g/dL — ABNORMAL LOW (ref 3.5–5.0)
Anion gap: 13 (ref 5–15)
BUN: 45 mg/dL — ABNORMAL HIGH (ref 6–20)
CO2: 27 mmol/L (ref 22–32)
Calcium: 10.9 mg/dL — ABNORMAL HIGH (ref 8.9–10.3)
Chloride: 91 mmol/L — ABNORMAL LOW (ref 98–111)
Creatinine, Ser: 3.35 mg/dL — ABNORMAL HIGH (ref 0.61–1.24)
GFR, Estimated: 21 mL/min — ABNORMAL LOW (ref >60)
Glucose, Bld: 136 mg/dL — ABNORMAL HIGH (ref 70–99)
Phosphorus: 2.7 mg/dL (ref 2.5–4.6)
Potassium: 3.4 mmol/L — ABNORMAL LOW (ref 3.5–5.1)
Sodium: 131 mmol/L — ABNORMAL LOW (ref 135–145)

## 2020-11-03 LAB — MAGNESIUM: Magnesium: 2.1 mg/dL (ref 1.7–2.4)

## 2020-11-03 LAB — CBC
HCT: 25.9 % — ABNORMAL LOW (ref 39.0–52.0)
Hemoglobin: 7.5 g/dL — ABNORMAL LOW (ref 13.0–17.0)
MCH: 28.1 pg (ref 26.0–34.0)
MCHC: 29 g/dL — ABNORMAL LOW (ref 30.0–36.0)
MCV: 97 fL (ref 80.0–100.0)
Platelets: 891 10*3/uL — ABNORMAL HIGH (ref 150–400)
RBC: 2.67 MIL/uL — ABNORMAL LOW (ref 4.22–5.81)
RDW: 21 % — ABNORMAL HIGH (ref 11.5–15.5)
WBC: 16.9 10*3/uL — ABNORMAL HIGH (ref 4.0–10.5)
nRBC: 0.5 % — ABNORMAL HIGH (ref 0.0–0.2)

## 2020-11-03 LAB — TRIGLYCERIDES: Triglycerides: 181 mg/dL — ABNORMAL HIGH (ref <150)

## 2020-11-03 NOTE — Progress Notes (Signed)
[facility]  ROUNDING NOTE   Subjective:  Patient seen with increasing respiratory distress this AM. May have possibly aspirated. Also has large abdominal fluid collection.   Objective:  Vital signs in last 24 hours:  Temperature 97.9 pulse 94 respiration 18 blood pressure 140/74   Physical Exam: General:  Chronically ill-appearing  Head:  Normocephalic, atraumatic. Moist oral mucosal membranes  Eyes:  Anicteric  Neck:  Supple  Lungs:   Coarse breath sounds bilateral, slightly increased work of breathing  Heart:  S1S2 no rubs  Abdomen:   Soft, nontender, bowel sounds present, PEG in place, colostomy  Extremities:  Right AKA  Neurologic:  Awake, alert, follows commands  Skin:  No lesions  Access:  Left upper extremity AV fistula    Basic Metabolic Panel: Recent Labs  Lab 10/30/20 0619 11/01/20 0444 11/03/20 0542  NA 135 132* 131*  K 3.9 3.7 3.4*  CL 97* 95* 91*  CO2 $Re'27 27 27  'sNt$ GLUCOSE 146* 162* 136*  BUN 75* 62* 45*  CREATININE 4.49* 3.88* 3.35*  CALCIUM 10.8* 10.6* 10.9*  MG 2.3 2.1 2.1  PHOS 3.0 2.9 2.7    Liver Function Tests: Recent Labs  Lab 10/30/20 0619 11/01/20 0444 11/03/20 0542  ALBUMIN 2.1* 2.1* 2.3*   No results for input(s): LIPASE, AMYLASE in the last 168 hours. No results for input(s): AMMONIA in the last 168 hours.  CBC: Recent Labs  Lab 10/30/20 1022 11/01/20 0444 11/03/20 0542  WBC 18.0* 19.2* 16.9*  HGB 7.4* 7.5* 7.5*  HCT 24.7* 24.9* 25.9*  MCV 96.9 96.1 97.0  PLT 983* 867* 891*    Cardiac Enzymes: No results for input(s): CKTOTAL, CKMB, CKMBINDEX, TROPONINI in the last 168 hours.  BNP: Invalid input(s): POCBNP  CBG: No results for input(s): GLUCAP in the last 168 hours.  Microbiology: Results for orders placed or performed during the hospital encounter of 09/22/20  Culture, respiratory (non-expectorated)     Status: None   Collection Time: 09/23/20 11:25 AM   Specimen: Tracheal Aspirate; Respiratory   Result Value Ref Range Status   Specimen Description TRACHEAL ASPIRATE  Final   Special Requests NONE  Final   Gram Stain   Final    RARE WBC PRESENT,BOTH PMN AND MONONUCLEAR NO ORGANISMS SEEN Performed at [facility], [address], [place], [place] [address]    Culture FEW KLEBSIELLA PNEUMONIAE  Final   Report Status 09/25/2020 FINAL  Final   Organism ID, Bacteria KLEBSIELLA PNEUMONIAE  Final      Susceptibility   Klebsiella pneumoniae - MIC*    AMPICILLIN >=32 RESISTANT Resistant     CEFAZOLIN <=4 SENSITIVE Sensitive     CEFEPIME 0.25 SENSITIVE Sensitive     CEFTAZIDIME <=1 SENSITIVE Sensitive     CEFTRIAXONE 1 SENSITIVE Sensitive     CIPROFLOXACIN <=0.25 SENSITIVE Sensitive     GENTAMICIN <=1 SENSITIVE Sensitive     IMIPENEM <=0.25 SENSITIVE Sensitive     TRIMETH/SULFA <=20 SENSITIVE Sensitive     AMPICILLIN/SULBACTAM 16 INTERMEDIATE Intermediate     PIP/TAZO 64 INTERMEDIATE Intermediate     * FEW KLEBSIELLA PNEUMONIAE  Culture, blood (routine x 2)     Status: None   Collection Time: 09/23/20  4:26 PM   Specimen: BLOOD  Result Value Ref Range Status   Specimen Description BLOOD BLOOD RIGHT HAND  Final   Special Requests   Final    AEROBIC BOTTLE ONLY Blood Culture results may not be optimal due to an inadequate  volume of blood received in culture bottles   Culture   Final    NO GROWTH 5 DAYS Performed at [facility], [place] [address] [place], [place] [address]    Report Status 09/28/2020 FINAL  Final  Culture, blood (single)     Status: None   Collection Time: 09/24/20  6:00 AM   Specimen: BLOOD RIGHT HAND  Result Value Ref Range Status   Specimen Description BLOOD RIGHT HAND  Final   Special Requests   Final    BOTTLES DRAWN AEROBIC ONLY Blood Culture results may not be optimal due to an inadequate volume of blood received in culture bottles   Culture   Final    NO GROWTH 5 DAYS Performed at [facility], [place] [address], [place], [place]  [address]    Report Status 09/29/2020 FINAL  Final  Body fluid culture     Status: None   Collection Time: 09/29/20 12:27 PM   Specimen: PATH Cytology Peritoneal fluid  Result Value Ref Range Status   Specimen Description PERITONEAL FLUID  Final   Special Requests NONE  Final   Gram Stain   Final    WBC PRESENT,BOTH PMN AND MONONUCLEAR NO ORGANISMS SEEN CYTOSPIN SMEAR    Culture   Final    NO GROWTH 3 DAYS Performed at [facility], [address], [place], [place] [address]    Report Status 10/03/2020 FINAL  Final  Culture, blood (Routine X 2) w Reflex to ID Panel     Status: Abnormal   Collection Time: 10/04/20 12:50 PM   Specimen: BLOOD  Result Value Ref Range Status   Specimen Description BLOOD RIGHT ANTECUBITAL  Final   Special Requests   Final    BOTTLES DRAWN AEROBIC AND ANAEROBIC Blood Culture adequate volume   Culture  Setup Time   Final    GRAM POSITIVE COCCI IN CLUSTERS AEROBIC BOTTLE ONLY CRITICAL RESULT CALLED TO, READ BACK BY AND VERIFIED WITH: A. [place] Silos RN 16:20 10/05/20 (wilsonm) Performed at [facility], [place] [address] [place], [place] [address]    Culture STAPHYLOCOCCUS EPIDERMIDIS (A)  Final   Report Status 10/07/2020 FINAL  Final   Organism ID, Bacteria STAPHYLOCOCCUS EPIDERMIDIS  Final      Susceptibility   Staphylococcus epidermidis - MIC*    CIPROFLOXACIN >=8 RESISTANT Resistant     ERYTHROMYCIN >=8 RESISTANT Resistant     GENTAMICIN >=16 RESISTANT Resistant     OXACILLIN >=4 RESISTANT Resistant     TETRACYCLINE 2 SENSITIVE Sensitive     VANCOMYCIN 2 SENSITIVE Sensitive     TRIMETH/SULFA 160 RESISTANT Resistant     CLINDAMYCIN >=8 RESISTANT Resistant     RIFAMPIN <=0.5 SENSITIVE Sensitive     Inducible Clindamycin NEGATIVE Sensitive     * STAPHYLOCOCCUS EPIDERMIDIS  Blood Culture ID Panel (Reflexed)     Status: Abnormal   Collection Time: 10/04/20 12:50 PM  Result Value Ref Range Status   Enterococcus faecalis NOT DETECTED NOT  DETECTED Final   Enterococcus Faecium NOT DETECTED NOT DETECTED Final   Listeria monocytogenes NOT DETECTED NOT DETECTED Final   Staphylococcus species DETECTED (A) NOT DETECTED Final    Comment: CRITICAL RESULT CALLED TO, READ BACK BY AND VERIFIED WITH: A. [place] Silos RN 16:20 10/05/20 (wilsonm)    Staphylococcus aureus (BCID) NOT DETECTED NOT DETECTED Final   Staphylococcus epidermidis DETECTED (A) NOT DETECTED Final    Comment: Methicillin (oxacillin) resistant coagulase negative staphylococcus. Possible blood culture  contaminant (unless isolated from more than one blood culture draw or clinical case suggests pathogenicity). No antibiotic treatment is indicated for blood  culture contaminants. CRITICAL RESULT CALLED TO, READ BACK BY AND VERIFIED WITH: A. [place] Silos RN 16:20 10/05/20 (wilsonm)    Staphylococcus lugdunensis NOT DETECTED NOT DETECTED Final   Streptococcus species NOT DETECTED NOT DETECTED Final   Streptococcus agalactiae NOT DETECTED NOT DETECTED Final   Streptococcus pneumoniae NOT DETECTED NOT DETECTED Final   Streptococcus pyogenes NOT DETECTED NOT DETECTED Final   A.calcoaceticus-baumannii NOT DETECTED NOT DETECTED Final   Bacteroides fragilis NOT DETECTED NOT DETECTED Final   Enterobacterales NOT DETECTED NOT DETECTED Final   Enterobacter cloacae complex NOT DETECTED NOT DETECTED Final   Escherichia coli NOT DETECTED NOT DETECTED Final   Klebsiella aerogenes NOT DETECTED NOT DETECTED Final   Klebsiella oxytoca NOT DETECTED NOT DETECTED Final   Klebsiella pneumoniae NOT DETECTED NOT DETECTED Final   Proteus species NOT DETECTED NOT DETECTED Final   Salmonella species NOT DETECTED NOT DETECTED Final   Serratia marcescens NOT DETECTED NOT DETECTED Final   Haemophilus influenzae NOT DETECTED NOT DETECTED Final   Neisseria meningitidis NOT DETECTED NOT DETECTED Final   Pseudomonas aeruginosa NOT DETECTED NOT DETECTED Final   Stenotrophomonas maltophilia NOT DETECTED NOT  DETECTED Final   Candida albicans NOT DETECTED NOT DETECTED Final   Candida auris NOT DETECTED NOT DETECTED Final   Candida glabrata NOT DETECTED NOT DETECTED Final   Candida krusei NOT DETECTED NOT DETECTED Final   Candida parapsilosis NOT DETECTED NOT DETECTED Final   Candida tropicalis NOT DETECTED NOT DETECTED Final   Cryptococcus neoformans/gattii NOT DETECTED NOT DETECTED Final   Methicillin resistance mecA/C DETECTED (A) NOT DETECTED Final    Comment: CRITICAL RESULT CALLED TO, READ BACK BY AND VERIFIED WITH: A. Grandville Silos RN 16:20 10/05/20 (wilsonm) Performed at [facility], [place] [address], [place], [place] [address]   Culture, blood (Routine X 2) w Reflex to ID Panel     Status: Abnormal   Collection Time: 10/04/20 12:51 PM   Specimen: BLOOD RIGHT HAND  Result Value Ref Range Status   Specimen Description BLOOD RIGHT HAND  Final   Special Requests   Final    BOTTLES DRAWN AEROBIC AND ANAEROBIC Blood Culture adequate volume   Culture  Setup Time   Final    GRAM POSITIVE COCCI AEROBIC BOTTLE ONLY CRITICAL VALUE NOTED.  VALUE IS CONSISTENT WITH PREVIOUSLY REPORTED AND CALLED VALUE.    Culture (A)  Final    STAPHYLOCOCCUS EPIDERMIDIS SUSCEPTIBILITIES PERFORMED ON PREVIOUS CULTURE WITHIN THE LAST 5 DAYS. Performed at [facility], [place] [address], [place], [place] [address]    Report Status 10/07/2020 FINAL  Final  C Difficile Quick Screen (NO PCR Reflex)     Status: None   Collection Time: 10/04/20  5:19 PM   Specimen: STOOL  Result Value Ref Range Status   C Diff antigen NEGATIVE NEGATIVE Final   C Diff toxin NEGATIVE NEGATIVE Final   C Diff interpretation No C. difficile detected.  Final    Comment: Performed at [facility], [place] [address] [place], [place] [address]  Culture, respiratory (non-expectorated)     Status: None   Collection Time: 10/05/20 10:14 PM   Specimen: Tracheal Aspirate; Respiratory  Result Value Ref Range Status   Specimen  Description TRACHEAL ASPIRATE  Final   Special Requests NONE  Final   Gram Stain   Final    MODERATE  WBC PRESENT, PREDOMINANTLY PMN MODERATE GRAM NEGATIVE RODS FEW YEAST RARE GRAM POSITIVE COCCI IN CHAINS Performed at [facility], [place] [address] [place], [place] [address]    Culture MODERATE KLEBSIELLA PNEUMONIAE  Final   Report Status 10/08/2020 FINAL  Final   Organism ID, Bacteria KLEBSIELLA PNEUMONIAE  Final      Susceptibility   Klebsiella pneumoniae - MIC*    AMPICILLIN RESISTANT Resistant     CEFAZOLIN <=4 SENSITIVE Sensitive     CEFEPIME <=0.12 SENSITIVE Sensitive     CEFTAZIDIME <=1 SENSITIVE Sensitive     CEFTRIAXONE <=0.25 SENSITIVE Sensitive     CIPROFLOXACIN >=4 RESISTANT Resistant     GENTAMICIN <=1 SENSITIVE Sensitive     IMIPENEM <=0.25 SENSITIVE Sensitive     TRIMETH/SULFA <=20 SENSITIVE Sensitive     AMPICILLIN/SULBACTAM 8 SENSITIVE Sensitive     PIP/TAZO 16 SENSITIVE Sensitive     * MODERATE KLEBSIELLA PNEUMONIAE  Culture, blood (routine x 2)     Status: None   Collection Time: 10/08/20  4:17 PM   Specimen: BLOOD RIGHT WRIST  Result Value Ref Range Status   Specimen Description BLOOD RIGHT WRIST  Final   Special Requests   Final    BOTTLES DRAWN AEROBIC ONLY Blood Culture results may not be optimal due to an excessive volume of blood received in culture bottles   Culture   Final    NO GROWTH 5 DAYS Performed at [facility], [place] [address], [place], [place] [address]    Report Status 10/13/2020 FINAL  Final  Culture, blood (routine x 2)     Status: None   Collection Time: 10/08/20  4:17 PM   Specimen: BLOOD RIGHT HAND  Result Value Ref Range Status   Specimen Description BLOOD RIGHT HAND  Final   Special Requests   Final    BOTTLES DRAWN AEROBIC ONLY Blood Culture results may not be optimal due to an excessive volume of blood received in culture bottles   Culture   Final    NO GROWTH 5 DAYS Performed at [facility], [place]  [address] [place], [place] [address]    Report Status 10/13/2020 FINAL  Final  Culture, respiratory (non-expectorated)     Status: None   Collection Time: 10/14/20  1:41 PM   Specimen: Tracheal Aspirate; Respiratory  Result Value Ref Range Status   Specimen Description TRACHEAL ASPIRATE  Final   Special Requests NONE  Final   Gram Stain   Final    RARE WBC PRESENT, PREDOMINANTLY PMN RARE GRAM NEGATIVE RODS    Culture   Final    RARE Normal respiratory flora-no Staph aureus or Pseudomonas seen Performed at [facility], [address] [place], [place] [address]    Report Status 10/17/2020 FINAL  Final  Culture, blood (routine x 2)     Status: Abnormal (Preliminary result)   Collection Time: 10/27/20  9:24 PM   Specimen: BLOOD  Result Value Ref Range Status   Specimen Description BLOOD RIGHT HAND  Final   Special Requests   Final    BOTTLES DRAWN AEROBIC ONLY Blood Culture adequate volume   Culture  Setup Time   Final    Organism ID to follow YEAST AEROBIC BOTTLE ONLY CRITICAL RESULT CALLED TO, READ BACK BY AND VERIFIED WITH: RN D [place] [id number] AT 1400 BY CM Performed at [facility], [place] [address], [place], [place] [address]    Culture CANDIDA GLABRATA (A)  Final   Report Status PENDING  Incomplete  Culture, blood (routine x 2)     Status: None   Collection Time: 10/27/20  9:24 PM   Specimen: BLOOD  Result Value Ref Range Status   Specimen Description BLOOD RIGHT ARM  Final   Special Requests   Final    BOTTLES DRAWN AEROBIC ONLY Blood Culture adequate volume   Culture   Final    NO GROWTH 5 DAYS Performed at [facility], [address] [place], [place] [address]    Report Status 11/01/2020 FINAL  Final  Blood Culture ID Panel (Reflexed)     Status: Abnormal   Collection Time: 10/27/20  9:24 PM  Result Value Ref Range Status   Enterococcus faecalis NOT DETECTED NOT DETECTED Final   Enterococcus Faecium NOT DETECTED NOT DETECTED Final   Listeria  monocytogenes NOT DETECTED NOT DETECTED Final   Staphylococcus species NOT DETECTED NOT DETECTED Final   Staphylococcus aureus (BCID) NOT DETECTED NOT DETECTED Final   Staphylococcus epidermidis NOT DETECTED NOT DETECTED Final   Staphylococcus lugdunensis NOT DETECTED NOT DETECTED Final   Streptococcus species NOT DETECTED NOT DETECTED Final   Streptococcus agalactiae NOT DETECTED NOT DETECTED Final   Streptococcus pneumoniae NOT DETECTED NOT DETECTED Final   Streptococcus pyogenes NOT DETECTED NOT DETECTED Final   A.calcoaceticus-baumannii NOT DETECTED NOT DETECTED Final   Bacteroides fragilis NOT DETECTED NOT DETECTED Final   Enterobacterales NOT DETECTED NOT DETECTED Final   Enterobacter cloacae complex NOT DETECTED NOT DETECTED Final   Escherichia coli NOT DETECTED NOT DETECTED Final   Klebsiella aerogenes NOT DETECTED NOT DETECTED Final   Klebsiella oxytoca NOT DETECTED NOT DETECTED Final   Klebsiella pneumoniae NOT DETECTED NOT DETECTED Final   Proteus species NOT DETECTED NOT DETECTED Final   Salmonella species NOT DETECTED NOT DETECTED Final   Serratia marcescens NOT DETECTED NOT DETECTED Final   Haemophilus influenzae NOT DETECTED NOT DETECTED Final   Neisseria meningitidis NOT DETECTED NOT DETECTED Final   Pseudomonas aeruginosa NOT DETECTED NOT DETECTED Final   Stenotrophomonas maltophilia NOT DETECTED NOT DETECTED Final   Candida albicans NOT DETECTED NOT DETECTED Final   Candida auris NOT DETECTED NOT DETECTED Final   Candida glabrata DETECTED (A) NOT DETECTED Final    Comment: CRITICAL RESULT CALLED TO, READ BACK BY AND VERIFIED WITH: PHARMD M MITCHELL [id number] AT 1350 BY CM    Candida krusei NOT DETECTED NOT DETECTED Final   Candida parapsilosis NOT DETECTED NOT DETECTED Final   Candida tropicalis NOT DETECTED NOT DETECTED Final   Cryptococcus neoformans/gattii NOT DETECTED NOT DETECTED Final    Comment: Performed at [facility], [address], [place],  [place] [address]  Gram stain     Status: None   Collection Time: 10/31/20  3:16 PM   Specimen: Abdomen; Peritoneal Fluid  Result Value Ref Range Status   Specimen Description FLUID PERITONEAL ABDOMEN  Final   Special Requests NONE  Final   Gram Stain   Final    RARE WBC PRESENT,BOTH PMN AND MONONUCLEAR NO ORGANISMS SEEN Performed at [facility], [address], [place], [place] [address]    Report Status 10/31/2020 FINAL  Final  Culture, body fluid-bottle     Status: None (Preliminary result)   Collection Time: 10/31/20  3:16 PM   Specimen: Fluid  Result Value Ref Range Status   Specimen Description FLUID PERITONEAL ABDOMEN  Final   Special Requests BOTTLES DRAWN AEROBIC AND ANAEROBIC  Final   Culture   Final    NO GROWTH 2 DAYS Performed at [facility], [place] [address], [place], [place] [address]    Report Status PENDING  Incomplete    Coagulation Studies: No results for input(s): LABPROT, INR in the last 72 hours.  Urinalysis: No results for input(s): COLORURINE, LABSPEC, PHURINE, GLUCOSEU, HGBUR, BILIRUBINUR, KETONESUR, PROTEINUR, UROBILINOGEN, NITRITE, LEUKOCYTESUR in the last 72 hours.  Invalid input(s): APPERANCEUR    Imaging: CT ABDOMEN PELVIS WO CONTRAST  Result Date: 11/02/2020 CLINICAL DATA:  Infectious gastroenteritis or colitis, lower abdominal fluid collection EXAM: CT ABDOMEN AND PELVIS WITHOUT CONTRAST TECHNIQUE: Multidetector CT imaging of the abdomen and pelvis was performed following the standard protocol without IV contrast. COMPARISON:  10/30/2020 FINDINGS: Lower chest: Multiple masslike opacities in the included lower lungs, not significantly changed compared to prior examination. Small right pleural effusion, likewise unchanged. Hepatobiliary: No solid liver abnormality is seen. Hepatomegaly. Status post percutaneous cholecystostomy with small gallstones in the dependent gallbladder. No biliary ductal dilatation. No significant gallbladder wall  thickening. Pancreas: Unremarkable. No pancreatic ductal dilatation or surrounding inflammatory changes. Spleen: Normal in size without significant abnormality. Adrenals/Urinary Tract: Adrenal glands are unremarkable. Multiple tiny bilateral nonobstructive renal calculi and/or vascular calcifications. No hydronephrosis. Bladder is unremarkable. Stomach/Bowel: Status post percutaneous gastrostomy. Status post subtotal colectomy with right lower quadrant end ostomy. Vascular/Lymphatic: Aortic atherosclerosis. No enlarged abdominal or pelvic lymph nodes. Reproductive: No mass or other significant abnormality. Other: Incompletely imaged large left inguinal hernia. No significant interval change in a large loculated air and fluid collection centered in the low abdomen and extending laterally into the upper abdomen, measuring approximately 24.8 x 11.1 cm (series 3, image 76). Anasarca. Musculoskeletal: No acute or significant osseous findings. Renal osteodystrophy. IMPRESSION: 1. No significant interval change in a large loculated air and fluid collection centered in the low abdomen and extending laterally into the upper abdomen, measuring approximately 24.8 x 11.1 cm. 2. Status post percutaneous cholecystostomy with small gallstones in the dependent gallbladder. No significant gallbladder wall thickening. No biliary ductal dilatation. 3. Status post subtotal colectomy with right lower quadrant end ostomy. 4. Multiple masslike opacities in the included lower lungs, not significantly changed compared to prior examination. Small right pleural effusion, likewise unchanged. 5. Incompletely imaged large left inguinal hernia. 6. Anasarca. 7. Aortic Atherosclerosis (ICD10-I70.0). Electronically Signed   By: Eddie Candle M.D.   On: 11/02/2020 15:43     Medications:     iohexol, lidocaine  Assessment/ Plan:  54 y.o. 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   1.  ESRD on HD.  Patient due for dialysis treatment today.  Orders have been prepared.  2.  Hypotension.  Patient has albumin and midodrine available for blood pressure support.  3.  Anemia of chronic kidney disease.   Lab Results  Component Value Date   HGB 7.5 (L) 11/03/2020  Continue Retacrit and monitoring of hemoglobin.  4.  Secondary hyperparathyroidism.  Phosphorus currently 2.7 and acceptable.  5.  Acute respiratory failure.  Worsening respiratory status today.  May have potentially aspirated.  NGT suctioning performed.    LOS: 0 Jeremy Yu  11/12/20217:42 AM

## 2020-11-03 NOTE — Progress Notes (Signed)
IR consulted by Robb Matar, NP for possible image-guided aspiration/drainage of intra-abdominal fluid collection.  Case/images have been reviewed by Dr. Kathlene Cote who does not recommend aspiration/drainage of fluid collection- intra-abdominal fluid collection is very loculated and drain placement carries an increased risk of peritonitis. No plans for IR intervention at this time- will delete order. Robb Matar, NP made aware.  IR available in future if needed.   Bea Graff Chey Cho, PA-C 11/03/2020, 11:45 AM

## 2020-11-04 ENCOUNTER — Other Ambulatory Visit (HOSPITAL_COMMUNITY): Payer: Medicare Other

## 2020-11-04 LAB — CBC
HCT: 26 % — ABNORMAL LOW (ref 39.0–52.0)
Hemoglobin: 7.6 g/dL — ABNORMAL LOW (ref 13.0–17.0)
MCH: 28.5 pg (ref 26.0–34.0)
MCHC: 29.2 g/dL — ABNORMAL LOW (ref 30.0–36.0)
MCV: 97.4 fL (ref 80.0–100.0)
Platelets: 763 10*3/uL — ABNORMAL HIGH (ref 150–400)
RBC: 2.67 MIL/uL — ABNORMAL LOW (ref 4.22–5.81)
RDW: 20.8 % — ABNORMAL HIGH (ref 11.5–15.5)
WBC: 19.1 10*3/uL — ABNORMAL HIGH (ref 4.0–10.5)
nRBC: 0.6 % — ABNORMAL HIGH (ref 0.0–0.2)

## 2020-11-05 ENCOUNTER — Other Ambulatory Visit (HOSPITAL_COMMUNITY): Payer: Medicare Other

## 2020-11-05 LAB — CULTURE, RESPIRATORY W GRAM STAIN

## 2020-11-05 LAB — CULTURE, BODY FLUID W GRAM STAIN -BOTTLE: Culture: NO GROWTH

## 2020-11-06 LAB — RENAL FUNCTION PANEL
Albumin: 1.9 g/dL — ABNORMAL LOW (ref 3.5–5.0)
Anion gap: 12 (ref 5–15)
BUN: 91 mg/dL — ABNORMAL HIGH (ref 6–20)
CO2: 25 mmol/L (ref 22–32)
Calcium: 11.8 mg/dL — ABNORMAL HIGH (ref 8.9–10.3)
Chloride: 94 mmol/L — ABNORMAL LOW (ref 98–111)
Creatinine, Ser: 3.59 mg/dL — ABNORMAL HIGH (ref 0.61–1.24)
GFR, Estimated: 19 mL/min — ABNORMAL LOW (ref >60)
Glucose, Bld: 151 mg/dL — ABNORMAL HIGH (ref 70–99)
Phosphorus: 1.2 mg/dL — ABNORMAL LOW (ref 2.5–4.6)
Potassium: 3.7 mmol/L (ref 3.5–5.1)
Sodium: 131 mmol/L — ABNORMAL LOW (ref 135–145)

## 2020-11-06 LAB — MISC LABCORP TEST (SEND OUT)
LabCorp test name: 9
Labcorp test code: 183119

## 2020-11-06 LAB — CBC
HCT: 24.3 % — ABNORMAL LOW (ref 39.0–52.0)
Hemoglobin: 7.2 g/dL — ABNORMAL LOW (ref 13.0–17.0)
MCH: 28.5 pg (ref 26.0–34.0)
MCHC: 29.6 g/dL — ABNORMAL LOW (ref 30.0–36.0)
MCV: 96 fL (ref 80.0–100.0)
Platelets: 875 10*3/uL — ABNORMAL HIGH (ref 150–400)
RBC: 2.53 MIL/uL — ABNORMAL LOW (ref 4.22–5.81)
RDW: 20.8 % — ABNORMAL HIGH (ref 11.5–15.5)
WBC: 22.1 10*3/uL — ABNORMAL HIGH (ref 4.0–10.5)
nRBC: 0.9 % — ABNORMAL HIGH (ref 0.0–0.2)

## 2020-11-06 LAB — MAGNESIUM: Magnesium: 2.4 mg/dL (ref 1.7–2.4)

## 2020-11-06 NOTE — Progress Notes (Signed)
[facility]  ROUNDING NOTE   Subjective:  Pt due for HD today. Lethargic but arousable.  Resting comfortably otherwise.  Objective:  Vital signs in last 24 hours:  Temperature 96.1 pulse 91 respirations 20 blood pressure 131/73   Physical Exam: General:  Chronically ill-appearing  Head:  Normocephalic, atraumatic. Moist oral mucosal membranes  Eyes:  Anicteric  Neck:  Supple  Lungs:   Coarse breath sounds bilateral, slightly increased work of breathing  Heart:  S1S2 no rubs  Abdomen:   Soft, nontender, bowel sounds present, PEG in place, colostomy  Extremities:  Right AKA  Neurologic:  Awake, alert, follows commands  Skin:  No lesions  Access:  Left upper extremity AV fistula    Basic Metabolic Panel: Recent Labs  Lab 11/01/20 0444 11/03/20 0542  NA 132* 131*  K 3.7 3.4*  CL 95* 91*  CO2 27 27  GLUCOSE 162* 136*  BUN 62* 45*  CREATININE 3.88* 3.35*  CALCIUM 10.6* 10.9*  MG 2.1 2.1  PHOS 2.9 2.7    Liver Function Tests: Recent Labs  Lab 11/01/20 0444 11/03/20 0542  ALBUMIN 2.1* 2.3*   No results for input(s): LIPASE, AMYLASE in the last 168 hours. No results for input(s): AMMONIA in the last 168 hours.  CBC: Recent Labs  Lab 10/30/20 1022 11/01/20 0444 11/03/20 0542 11/04/20 0642  WBC 18.0* 19.2* 16.9* 19.1*  HGB 7.4* 7.5* 7.5* 7.6*  HCT 24.7* 24.9* 25.9* 26.0*  MCV 96.9 96.1 97.0 97.4  PLT 983* 867* 891* 763*    Cardiac Enzymes: No results for input(s): CKTOTAL, CKMB, CKMBINDEX, TROPONINI in the last 168 hours.  BNP: Invalid input(s): POCBNP  CBG: No results for input(s): GLUCAP in the last 168 hours.  Microbiology: Results for orders placed or performed during the hospital encounter of 09/22/20  Culture, respiratory (non-expectorated)     Status: None   Collection Time: 09/23/20 11:25 AM   Specimen: Tracheal Aspirate; Respiratory  Result Value Ref Range Status   Specimen Description TRACHEAL ASPIRATE  Final   Special  Requests NONE  Final   Gram Stain   Final    RARE WBC PRESENT,BOTH PMN AND MONONUCLEAR NO ORGANISMS SEEN Performed at [facility], [address], [place], [place] [address]    Culture FEW KLEBSIELLA PNEUMONIAE  Final   Report Status 09/25/2020 FINAL  Final   Organism ID, Bacteria KLEBSIELLA PNEUMONIAE  Final      Susceptibility   Klebsiella pneumoniae - MIC*    AMPICILLIN >=32 RESISTANT Resistant     CEFAZOLIN <=4 SENSITIVE Sensitive     CEFEPIME 0.25 SENSITIVE Sensitive     CEFTAZIDIME <=1 SENSITIVE Sensitive     CEFTRIAXONE 1 SENSITIVE Sensitive     CIPROFLOXACIN <=0.25 SENSITIVE Sensitive     GENTAMICIN <=1 SENSITIVE Sensitive     IMIPENEM <=0.25 SENSITIVE Sensitive     TRIMETH/SULFA <=20 SENSITIVE Sensitive     AMPICILLIN/SULBACTAM 16 INTERMEDIATE Intermediate     PIP/TAZO 64 INTERMEDIATE Intermediate     * FEW KLEBSIELLA PNEUMONIAE  Culture, blood (routine x 2)     Status: None   Collection Time: 09/23/20  4:26 PM   Specimen: BLOOD  Result Value Ref Range Status   Specimen Description BLOOD BLOOD RIGHT HAND  Final   Special Requests   Final    AEROBIC BOTTLE ONLY Blood Culture results may not be optimal due to an inadequate volume of blood received in culture bottles   Culture   Final  NO GROWTH 5 DAYS Performed at [facility], [place] [address] [place], [place] [address]    Report Status 09/28/2020 FINAL  Final  Culture, blood (single)     Status: None   Collection Time: 09/24/20  6:00 AM   Specimen: BLOOD RIGHT HAND  Result Value Ref Range Status   Specimen Description BLOOD RIGHT HAND  Final   Special Requests   Final    BOTTLES DRAWN AEROBIC ONLY Blood Culture results may not be optimal due to an inadequate volume of blood received in culture bottles   Culture   Final    NO GROWTH 5 DAYS Performed at [facility], [place] [address], [place], [place] [address]    Report Status 09/29/2020 FINAL  Final  Body fluid culture     Status: None    Collection Time: 09/29/20 12:27 PM   Specimen: PATH Cytology Peritoneal fluid  Result Value Ref Range Status   Specimen Description PERITONEAL FLUID  Final   Special Requests NONE  Final   Gram Stain   Final    WBC PRESENT,BOTH PMN AND MONONUCLEAR NO ORGANISMS SEEN CYTOSPIN SMEAR    Culture   Final    NO GROWTH 3 DAYS Performed at [facility], [address] [place], [place] [address]    Report Status 10/03/2020 FINAL  Final  Culture, blood (Routine X 2) w Reflex to ID Panel     Status: Abnormal   Collection Time: 10/04/20 12:50 PM   Specimen: BLOOD  Result Value Ref Range Status   Specimen Description BLOOD RIGHT ANTECUBITAL  Final   Special Requests   Final    BOTTLES DRAWN AEROBIC AND ANAEROBIC Blood Culture adequate volume   Culture  Setup Time   Final    GRAM POSITIVE COCCI IN CLUSTERS AEROBIC BOTTLE ONLY CRITICAL RESULT CALLED TO, READ BACK BY AND VERIFIED WITH: A. [place] Silos RN 16:20 10/05/20 ([email or web address]) Performed at [facility], [place] [address], [place], [place] [address]    Culture STAPHYLOCOCCUS EPIDERMIDIS (A)  Final   Report Status 10/07/2020 FINAL  Final   Organism ID, Bacteria STAPHYLOCOCCUS EPIDERMIDIS  Final      Susceptibility   Staphylococcus epidermidis - MIC*    CIPROFLOXACIN >=8 RESISTANT Resistant     ERYTHROMYCIN >=8 RESISTANT Resistant     GENTAMICIN >=16 RESISTANT Resistant     OXACILLIN >=4 RESISTANT Resistant     TETRACYCLINE 2 SENSITIVE Sensitive     VANCOMYCIN 2 SENSITIVE Sensitive     TRIMETH/SULFA 160 RESISTANT Resistant     CLINDAMYCIN >=8 RESISTANT Resistant     RIFAMPIN <=0.5 SENSITIVE Sensitive     Inducible Clindamycin NEGATIVE Sensitive     * STAPHYLOCOCCUS EPIDERMIDIS  Blood Culture ID Panel (Reflexed)     Status: Abnormal   Collection Time: 10/04/20 12:50 PM  Result Value Ref Range Status   Enterococcus faecalis NOT DETECTED NOT DETECTED Final   Enterococcus Faecium NOT DETECTED NOT DETECTED Final   Listeria  monocytogenes NOT DETECTED NOT DETECTED Final   Staphylococcus species DETECTED (A) NOT DETECTED Final    Comment: CRITICAL RESULT CALLED TO, READ BACK BY AND VERIFIED WITH: A. [place] Silos RN 16:20 10/05/20 (wilsonm)    Staphylococcus aureus (BCID) NOT DETECTED NOT DETECTED Final   Staphylococcus epidermidis DETECTED (A) NOT DETECTED Final    Comment: Methicillin (oxacillin) resistant coagulase negative staphylococcus. Possible blood culture contaminant (unless isolated from more than one blood culture draw or clinical case suggests pathogenicity). No  antibiotic treatment is indicated for blood  culture contaminants. CRITICAL RESULT CALLED TO, READ BACK BY AND VERIFIED WITH: A. [place] Silos RN 16:20 10/05/20 (wilsonm)    Staphylococcus lugdunensis NOT DETECTED NOT DETECTED Final   Streptococcus species NOT DETECTED NOT DETECTED Final   Streptococcus agalactiae NOT DETECTED NOT DETECTED Final   Streptococcus pneumoniae NOT DETECTED NOT DETECTED Final   Streptococcus pyogenes NOT DETECTED NOT DETECTED Final   A.calcoaceticus-baumannii NOT DETECTED NOT DETECTED Final   Bacteroides fragilis NOT DETECTED NOT DETECTED Final   Enterobacterales NOT DETECTED NOT DETECTED Final   Enterobacter cloacae complex NOT DETECTED NOT DETECTED Final   Escherichia coli NOT DETECTED NOT DETECTED Final   Klebsiella aerogenes NOT DETECTED NOT DETECTED Final   Klebsiella oxytoca NOT DETECTED NOT DETECTED Final   Klebsiella pneumoniae NOT DETECTED NOT DETECTED Final   Proteus species NOT DETECTED NOT DETECTED Final   Salmonella species NOT DETECTED NOT DETECTED Final   Serratia marcescens NOT DETECTED NOT DETECTED Final   Haemophilus influenzae NOT DETECTED NOT DETECTED Final   Neisseria meningitidis NOT DETECTED NOT DETECTED Final   Pseudomonas aeruginosa NOT DETECTED NOT DETECTED Final   Stenotrophomonas maltophilia NOT DETECTED NOT DETECTED Final   Candida albicans NOT DETECTED NOT DETECTED Final   Candida auris  NOT DETECTED NOT DETECTED Final   Candida glabrata NOT DETECTED NOT DETECTED Final   Candida krusei NOT DETECTED NOT DETECTED Final   Candida parapsilosis NOT DETECTED NOT DETECTED Final   Candida tropicalis NOT DETECTED NOT DETECTED Final   Cryptococcus neoformans/gattii NOT DETECTED NOT DETECTED Final   Methicillin resistance mecA/C DETECTED (A) NOT DETECTED Final    Comment: CRITICAL RESULT CALLED TO, READ BACK BY AND VERIFIED WITH: A. [place] Silos RN 16:20 10/05/20 (wilsonm) Performed at [facility], [place] [address], [place], [place] [address]   Culture, blood (Routine X 2) w Reflex to ID Panel     Status: Abnormal   Collection Time: 10/04/20 12:51 PM   Specimen: BLOOD RIGHT HAND  Result Value Ref Range Status   Specimen Description BLOOD RIGHT HAND  Final   Special Requests   Final    BOTTLES DRAWN AEROBIC AND ANAEROBIC Blood Culture adequate volume   Culture  Setup Time   Final    GRAM POSITIVE COCCI AEROBIC BOTTLE ONLY CRITICAL VALUE NOTED.  VALUE IS CONSISTENT WITH PREVIOUSLY REPORTED AND CALLED VALUE.    Culture (A)  Final    STAPHYLOCOCCUS EPIDERMIDIS SUSCEPTIBILITIES PERFORMED ON PREVIOUS CULTURE WITHIN THE LAST 5 DAYS. Performed at [facility], [place] [address] [place], [place] [address]    Report Status 10/07/2020 FINAL  Final  C Difficile Quick Screen (NO PCR Reflex)     Status: None   Collection Time: 10/04/20  5:19 PM   Specimen: STOOL  Result Value Ref Range Status   C Diff antigen NEGATIVE NEGATIVE Final   C Diff toxin NEGATIVE NEGATIVE Final   C Diff interpretation No C. difficile detected.  Final    Comment: Performed at [facility], [place] [address], [place], [place] [address]  Culture, respiratory (non-expectorated)     Status: None   Collection Time: 10/05/20 10:14 PM   Specimen: Tracheal Aspirate; Respiratory  Result Value Ref Range Status   Specimen Description TRACHEAL ASPIRATE  Final   Special Requests NONE  Final   Gram Stain   Final     MODERATE WBC PRESENT, PREDOMINANTLY PMN MODERATE GRAM NEGATIVE RODS FEW YEAST RARE GRAM POSITIVE COCCI IN CHAINS  Performed at [facility], [place] [address] [place], [place] [address]    Culture MODERATE KLEBSIELLA PNEUMONIAE  Final   Report Status 10/08/2020 FINAL  Final   Organism ID, Bacteria KLEBSIELLA PNEUMONIAE  Final      Susceptibility   Klebsiella pneumoniae - MIC*    AMPICILLIN RESISTANT Resistant     CEFAZOLIN <=4 SENSITIVE Sensitive     CEFEPIME <=0.12 SENSITIVE Sensitive     CEFTAZIDIME <=1 SENSITIVE Sensitive     CEFTRIAXONE <=0.25 SENSITIVE Sensitive     CIPROFLOXACIN >=4 RESISTANT Resistant     GENTAMICIN <=1 SENSITIVE Sensitive     IMIPENEM <=0.25 SENSITIVE Sensitive     TRIMETH/SULFA <=20 SENSITIVE Sensitive     AMPICILLIN/SULBACTAM 8 SENSITIVE Sensitive     PIP/TAZO 16 SENSITIVE Sensitive     * MODERATE KLEBSIELLA PNEUMONIAE  Culture, blood (routine x 2)     Status: None   Collection Time: 10/08/20  4:17 PM   Specimen: BLOOD RIGHT WRIST  Result Value Ref Range Status   Specimen Description BLOOD RIGHT WRIST  Final   Special Requests   Final    BOTTLES DRAWN AEROBIC ONLY Blood Culture results may not be optimal due to an excessive volume of blood received in culture bottles   Culture   Final    NO GROWTH 5 DAYS Performed at [facility], [place] [address], [place], [place] [address]    Report Status 10/13/2020 FINAL  Final  Culture, blood (routine x 2)     Status: None   Collection Time: 10/08/20  4:17 PM   Specimen: BLOOD RIGHT HAND  Result Value Ref Range Status   Specimen Description BLOOD RIGHT HAND  Final   Special Requests   Final    BOTTLES DRAWN AEROBIC ONLY Blood Culture results may not be optimal due to an excessive volume of blood received in culture bottles   Culture   Final    NO GROWTH 5 DAYS Performed at [facility], [place] [address], [place], [place] [address]    Report Status 10/13/2020 FINAL  Final  Culture,  respiratory (non-expectorated)     Status: None   Collection Time: 10/14/20  1:41 PM   Specimen: Tracheal Aspirate; Respiratory  Result Value Ref Range Status   Specimen Description TRACHEAL ASPIRATE  Final   Special Requests NONE  Final   Gram Stain   Final    RARE WBC PRESENT, PREDOMINANTLY PMN RARE GRAM NEGATIVE RODS    Culture   Final    RARE Normal respiratory flora-no Staph aureus or Pseudomonas seen Performed at [facility], [address], [place], [place] [address]    Report Status 10/17/2020 FINAL  Final  Culture, blood (routine x 2)     Status: Abnormal (Preliminary result)   Collection Time: 10/27/20  9:24 PM   Specimen: BLOOD  Result Value Ref Range Status   Specimen Description BLOOD RIGHT HAND  Final   Special Requests   Final    BOTTLES DRAWN AEROBIC ONLY Blood Culture adequate volume   Culture  Setup Time   Final    Organism ID to follow YEAST AEROBIC BOTTLE ONLY CRITICAL RESULT CALLED TO, READ BACK BY AND VERIFIED WITH: RN D [place] [id number] AT 1400 BY CM    Culture (A)  Final    CANDIDA GLABRATA Sent to [facility] for further susceptibility testing. Performed at [facility], [place] [address], [place], [place] [address]    Report  Status PENDING  Incomplete  Culture, blood (routine x 2)     Status: None   Collection Time: 10/27/20  9:24 PM   Specimen: BLOOD  Result Value Ref Range Status   Specimen Description BLOOD RIGHT ARM  Final   Special Requests   Final    BOTTLES DRAWN AEROBIC ONLY Blood Culture adequate volume   Culture   Final    NO GROWTH 5 DAYS Performed at [facility], [address] [place], [place] [address]    Report Status 11/01/2020 FINAL  Final  Blood Culture ID Panel (Reflexed)     Status: Abnormal   Collection Time: 10/27/20  9:24 PM  Result Value Ref Range Status   Enterococcus faecalis NOT DETECTED NOT DETECTED Final   Enterococcus Faecium NOT DETECTED NOT DETECTED Final   Listeria monocytogenes NOT DETECTED  NOT DETECTED Final   Staphylococcus species NOT DETECTED NOT DETECTED Final   Staphylococcus aureus (BCID) NOT DETECTED NOT DETECTED Final   Staphylococcus epidermidis NOT DETECTED NOT DETECTED Final   Staphylococcus lugdunensis NOT DETECTED NOT DETECTED Final   Streptococcus species NOT DETECTED NOT DETECTED Final   Streptococcus agalactiae NOT DETECTED NOT DETECTED Final   Streptococcus pneumoniae NOT DETECTED NOT DETECTED Final   Streptococcus pyogenes NOT DETECTED NOT DETECTED Final   A.calcoaceticus-baumannii NOT DETECTED NOT DETECTED Final   Bacteroides fragilis NOT DETECTED NOT DETECTED Final   Enterobacterales NOT DETECTED NOT DETECTED Final   Enterobacter cloacae complex NOT DETECTED NOT DETECTED Final   Escherichia coli NOT DETECTED NOT DETECTED Final   Klebsiella aerogenes NOT DETECTED NOT DETECTED Final   Klebsiella oxytoca NOT DETECTED NOT DETECTED Final   Klebsiella pneumoniae NOT DETECTED NOT DETECTED Final   Proteus species NOT DETECTED NOT DETECTED Final   Salmonella species NOT DETECTED NOT DETECTED Final   Serratia marcescens NOT DETECTED NOT DETECTED Final   Haemophilus influenzae NOT DETECTED NOT DETECTED Final   Neisseria meningitidis NOT DETECTED NOT DETECTED Final   Pseudomonas aeruginosa NOT DETECTED NOT DETECTED Final   Stenotrophomonas maltophilia NOT DETECTED NOT DETECTED Final   Candida albicans NOT DETECTED NOT DETECTED Final   Candida auris NOT DETECTED NOT DETECTED Final   Candida glabrata DETECTED (A) NOT DETECTED Final    Comment: CRITICAL RESULT CALLED TO, READ BACK BY AND VERIFIED WITH: PHARMD M MITCHELL [id number] AT 1350 BY CM    Candida krusei NOT DETECTED NOT DETECTED Final   Candida parapsilosis NOT DETECTED NOT DETECTED Final   Candida tropicalis NOT DETECTED NOT DETECTED Final   Cryptococcus neoformans/gattii NOT DETECTED NOT DETECTED Final    Comment: Performed at [facility], [address], [place], [place] [address]  Gram stain      Status: None   Collection Time: 10/31/20  3:16 PM   Specimen: Abdomen; Peritoneal Fluid  Result Value Ref Range Status   Specimen Description FLUID PERITONEAL ABDOMEN  Final   Special Requests NONE  Final   Gram Stain   Final    RARE WBC PRESENT,BOTH PMN AND MONONUCLEAR NO ORGANISMS SEEN Performed at [facility], [address] [place], [place] [address]    Report Status 10/31/2020 FINAL  Final  Culture, body fluid-bottle     Status: None   Collection Time: 10/31/20  3:16 PM   Specimen: Fluid  Result Value Ref Range Status   Specimen Description FLUID PERITONEAL ABDOMEN  Final   Special Requests BOTTLES DRAWN AEROBIC AND ANAEROBIC  Final   Culture  Final    NO GROWTH 5 DAYS Performed at [facility], [place] [address], [place], [place] [address]    Report Status 11/05/2020 FINAL  Final  Culture, blood (routine x 2)     Status: None (Preliminary result)   Collection Time: 11/03/20  3:32 PM   Specimen: BLOOD  Result Value Ref Range Status   Specimen Description BLOOD LEFT ANTECUBITAL  Final   Special Requests   Final    BOTTLES DRAWN AEROBIC AND ANAEROBIC Blood Culture results may not be optimal due to an inadequate volume of blood received in culture bottles   Culture   Final    NO GROWTH 2 DAYS Performed at [facility], [place] [address], [place], [place] [address]    Report Status PENDING  Incomplete  Culture, blood (routine x 2)     Status: None (Preliminary result)   Collection Time: 11/03/20  3:45 PM   Specimen: BLOOD  Result Value Ref Range Status   Specimen Description BLOOD LEFT ANTECUBITAL  Final   Special Requests   Final    BOTTLES DRAWN AEROBIC ONLY Blood Culture adequate volume   Culture   Final    NO GROWTH 2 DAYS Performed at [facility], [place] [address] [place], [place] [address]    Report Status PENDING  Incomplete  Culture, respiratory (non-expectorated)     Status: None   Collection Time: 11/03/20  4:44 PM   Specimen: Tracheal  Aspirate; Respiratory  Result Value Ref Range Status   Specimen Description TRACHEAL ASPIRATE  Final   Special Requests NONE  Final   Gram Stain   Final    FEW WBC PRESENT, PREDOMINANTLY PMN ABUNDANT GRAM NEGATIVE RODS MODERATE GRAM POSITIVE RODS FEW GRAM POSITIVE COCCI FEW YEAST Performed at [facility], [address], [place], [place] [address]    Culture ABUNDANT KLEBSIELLA PNEUMONIAE  Final   Report Status 11/05/2020 FINAL  Final   Organism ID, Bacteria KLEBSIELLA PNEUMONIAE  Final      Susceptibility   Klebsiella pneumoniae - MIC*    AMPICILLIN RESISTANT Resistant     CEFAZOLIN <=4 SENSITIVE Sensitive     CEFEPIME <=0.12 SENSITIVE Sensitive     CEFTAZIDIME <=1 SENSITIVE Sensitive     CEFTRIAXONE <=0.25 SENSITIVE Sensitive     CIPROFLOXACIN 1 SENSITIVE Sensitive     GENTAMICIN <=1 SENSITIVE Sensitive     IMIPENEM <=0.25 SENSITIVE Sensitive     TRIMETH/SULFA <=20 SENSITIVE Sensitive     AMPICILLIN/SULBACTAM 4 SENSITIVE Sensitive     PIP/TAZO <=4 SENSITIVE Sensitive     * ABUNDANT KLEBSIELLA PNEUMONIAE    Coagulation Studies: No results for input(s): LABPROT, INR in the last 72 hours.  Urinalysis: No results for input(s): COLORURINE, LABSPEC, PHURINE, GLUCOSEU, HGBUR, BILIRUBINUR, KETONESUR, PROTEINUR, UROBILINOGEN, NITRITE, LEUKOCYTESUR in the last 72 hours.  Invalid input(s): APPERANCEUR    Imaging: DG Chest Port 1 View  Result Date: 11/05/2020 CLINICAL DATA:  PICC line placement EXAM: PORTABLE CHEST 1 VIEW COMPARISON:  11/04/2020 FINDINGS: Low lung volumes are present, causing crowding of the pulmonary vasculature. Indistinct airspace opacities persist at both lung bases. There is potentially hazy nodularity in the lungs. A left central line projects over the right atrium. A 7.6 cm rounded lucency with dense margins projects over the central mediastinum, and may be outside of the patient but forms an unusual shadow. IMPRESSION: 1. Left central line tip: Right  atrium. No pneumothorax. 2. Indistinct airspace opacities at both lung  bases. 3. 7.6 cm rounded lucency with dense margins projecting over the central mediastinum, probably outside of the patient but forms an unusual shadow. 4. Continued airspace opacities especially at the lung bases, with vague nodularity and low lung volumes. 5. A 7.6 cm in diameter rounded lucency projects over the mediastinum. This is of uncertain etiology, and seems to have some sort of rounded tubular density along its margin and perhaps centrally. This is not an obvious swallowed denture, but if there is not an obvious corresponding object on the outside of the patient over the central chest, then consider chest CT for definitive characterization. Electronically Signed   By: Van Clines M.D.   On: 11/05/2020 17:00     Medications:     iohexol, lidocaine  Assessment/ Plan:  54 y.o. 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   1.  ESRD on HD.  Maintain the patient on MWF schedule.  Due for dialysis treatment today.  2.  Hypotension.  Maintain midodrine and albumin.  3.  Anemia of chronic kidney disease.   Lab Results  Component Value Date   HGB 7.6 (L) 11/04/2020  Hemoglobin currently 7.6.  Maintain the patient on Retacrit.  4.  Secondary hyperparathyroidism.  Repeat serum phosphorus today.  5.  Acute respiratory failure.  Slightly improved as compared to Friday.  Continue to monitor.    LOS: 0 Jeremy Yu 11/15/20217:58 AM

## 2020-11-08 LAB — CBC
HCT: 26.1 % — ABNORMAL LOW (ref 39.0–52.0)
Hemoglobin: 7.7 g/dL — ABNORMAL LOW (ref 13.0–17.0)
MCH: 28.8 pg (ref 26.0–34.0)
MCHC: 29.5 g/dL — ABNORMAL LOW (ref 30.0–36.0)
MCV: 97.8 fL (ref 80.0–100.0)
Platelets: 897 10*3/uL — ABNORMAL HIGH (ref 150–400)
RBC: 2.67 MIL/uL — ABNORMAL LOW (ref 4.22–5.81)
RDW: 21.2 % — ABNORMAL HIGH (ref 11.5–15.5)
WBC: 24.3 10*3/uL — ABNORMAL HIGH (ref 4.0–10.5)
nRBC: 3.3 % — ABNORMAL HIGH (ref 0.0–0.2)

## 2020-11-08 LAB — CULTURE, BLOOD (ROUTINE X 2)
Culture: NO GROWTH
Culture: NO GROWTH
Special Requests: ADEQUATE

## 2020-11-08 LAB — RENAL FUNCTION PANEL
Albumin: 2.2 g/dL — ABNORMAL LOW (ref 3.5–5.0)
Anion gap: 10 (ref 5–15)
BUN: 71 mg/dL — ABNORMAL HIGH (ref 6–20)
CO2: 26 mmol/L (ref 22–32)
Calcium: 11.7 mg/dL — ABNORMAL HIGH (ref 8.9–10.3)
Chloride: 97 mmol/L — ABNORMAL LOW (ref 98–111)
Creatinine, Ser: 3.1 mg/dL — ABNORMAL HIGH (ref 0.61–1.24)
GFR, Estimated: 23 mL/min — ABNORMAL LOW (ref >60)
Glucose, Bld: 141 mg/dL — ABNORMAL HIGH (ref 70–99)
Phosphorus: 1.9 mg/dL — ABNORMAL LOW (ref 2.5–4.6)
Potassium: 4 mmol/L (ref 3.5–5.1)
Sodium: 133 mmol/L — ABNORMAL LOW (ref 135–145)

## 2020-11-08 LAB — MAGNESIUM: Magnesium: 2.1 mg/dL (ref 1.7–2.4)

## 2020-11-08 NOTE — Progress Notes (Signed)
[facility]  ROUNDING NOTE   Subjective:  Patient seen and evaluated at bedside. Lethargic but arousable this AM. Still with coarse breath sounds bilateral.  Objective:  Vital signs in last 24 hours:  Temperature 95.6 pulse 84 respirations 30 blood pressure 142/60   Physical Exam: General:  Chronically ill-appearing  Head:  Normocephalic, atraumatic. Moist oral mucosal membranes  Eyes:  Anicteric  Neck:  Supple  Lungs:   Coarse breath sounds bilateral  Heart:  S1S2 no rubs  Abdomen:   Soft, nontender, bowel sounds present, PEG in place, colostomy  Extremities:  Right AKA  Neurologic:  Awake, alert, follows commands  Skin:  No lesions  Access:  Left upper extremity AV fistula    Basic Metabolic Panel: Recent Labs  Lab 11/03/20 0542 11/06/20 0630 11/08/20 0517  NA 131* 131* 133*  K 3.4* 3.7 4.0  CL 91* 94* 97*  CO2 $Re'27 25 26  'rmi$ GLUCOSE 136* 151* 141*  BUN 45* 91* 71*  CREATININE 3.35* 3.59* 3.10*  CALCIUM 10.9* 11.8* 11.7*  MG 2.1 2.4 2.1  PHOS 2.7 1.2* 1.9*    Liver Function Tests: Recent Labs  Lab 11/03/20 0542 11/06/20 0630 11/08/20 0517  ALBUMIN 2.3* 1.9* 2.2*   No results for input(s): LIPASE, AMYLASE in the last 168 hours. No results for input(s): AMMONIA in the last 168 hours.  CBC: Recent Labs  Lab 11/03/20 0542 11/04/20 0642 11/06/20 0630 11/08/20 0517  WBC 16.9* 19.1* 22.1* 24.3*  HGB 7.5* 7.6* 7.2* 7.7*  HCT 25.9* 26.0* 24.3* 26.1*  MCV 97.0 97.4 96.0 97.8  PLT 891* 763* 875* 897*    Cardiac Enzymes: No results for input(s): CKTOTAL, CKMB, CKMBINDEX, TROPONINI in the last 168 hours.  BNP: Invalid input(s): POCBNP  CBG: No results for input(s): GLUCAP in the last 168 hours.  Microbiology: Results for orders placed or performed during the hospital encounter of 09/22/20  Culture, respiratory (non-expectorated)     Status: None   Collection Time: 09/23/20 11:25 AM   Specimen: Tracheal Aspirate; Respiratory  Result  Value Ref Range Status   Specimen Description TRACHEAL ASPIRATE  Final   Special Requests NONE  Final   Gram Stain   Final    RARE WBC PRESENT,BOTH PMN AND MONONUCLEAR NO ORGANISMS SEEN Performed at [facility], [address], [place], [place] [address]    Culture FEW KLEBSIELLA PNEUMONIAE  Final   Report Status 09/25/2020 FINAL  Final   Organism ID, Bacteria KLEBSIELLA PNEUMONIAE  Final      Susceptibility   Klebsiella pneumoniae - MIC*    AMPICILLIN >=32 RESISTANT Resistant     CEFAZOLIN <=4 SENSITIVE Sensitive     CEFEPIME 0.25 SENSITIVE Sensitive     CEFTAZIDIME <=1 SENSITIVE Sensitive     CEFTRIAXONE 1 SENSITIVE Sensitive     CIPROFLOXACIN <=0.25 SENSITIVE Sensitive     GENTAMICIN <=1 SENSITIVE Sensitive     IMIPENEM <=0.25 SENSITIVE Sensitive     TRIMETH/SULFA <=20 SENSITIVE Sensitive     AMPICILLIN/SULBACTAM 16 INTERMEDIATE Intermediate     PIP/TAZO 64 INTERMEDIATE Intermediate     * FEW KLEBSIELLA PNEUMONIAE  Culture, blood (routine x 2)     Status: None   Collection Time: 09/23/20  4:26 PM   Specimen: BLOOD  Result Value Ref Range Status   Specimen Description BLOOD BLOOD RIGHT HAND  Final   Special Requests   Final    AEROBIC BOTTLE ONLY Blood Culture results may not be optimal due to an inadequate  volume of blood received in culture bottles   Culture   Final    NO GROWTH 5 DAYS Performed at [facility], [place] [address], [place], [place] [address]    Report Status 09/28/2020 FINAL  Final  Culture, blood (single)     Status: None   Collection Time: 09/24/20  6:00 AM   Specimen: BLOOD RIGHT HAND  Result Value Ref Range Status   Specimen Description BLOOD RIGHT HAND  Final   Special Requests   Final    BOTTLES DRAWN AEROBIC ONLY Blood Culture results may not be optimal due to an inadequate volume of blood received in culture bottles   Culture   Final    NO GROWTH 5 DAYS Performed at [facility], [place] [address], [place], [place] [address]     Report Status 09/29/2020 FINAL  Final  Body fluid culture     Status: None   Collection Time: 09/29/20 12:27 PM   Specimen: PATH Cytology Peritoneal fluid  Result Value Ref Range Status   Specimen Description PERITONEAL FLUID  Final   Special Requests NONE  Final   Gram Stain   Final    WBC PRESENT,BOTH PMN AND MONONUCLEAR NO ORGANISMS SEEN CYTOSPIN SMEAR    Culture   Final    NO GROWTH 3 DAYS Performed at [facility], [address] [place], [place] [address]    Report Status 10/03/2020 FINAL  Final  Culture, blood (Routine X 2) w Reflex to ID Panel     Status: Abnormal   Collection Time: 10/04/20 12:50 PM   Specimen: BLOOD  Result Value Ref Range Status   Specimen Description BLOOD RIGHT ANTECUBITAL  Final   Special Requests   Final    BOTTLES DRAWN AEROBIC AND ANAEROBIC Blood Culture adequate volume   Culture  Setup Time   Final    GRAM POSITIVE COCCI IN CLUSTERS AEROBIC BOTTLE ONLY CRITICAL RESULT CALLED TO, READ BACK BY AND VERIFIED WITH: A. Grandville Silos RN 16:20 10/05/20 (wilsonm) Performed at [facility], [place] [address], [place], [place] [address]    Culture STAPHYLOCOCCUS EPIDERMIDIS (A)  Final   Report Status 10/07/2020 FINAL  Final   Organism ID, Bacteria STAPHYLOCOCCUS EPIDERMIDIS  Final      Susceptibility   Staphylococcus epidermidis - MIC*    CIPROFLOXACIN >=8 RESISTANT Resistant     ERYTHROMYCIN >=8 RESISTANT Resistant     GENTAMICIN >=16 RESISTANT Resistant     OXACILLIN >=4 RESISTANT Resistant     TETRACYCLINE 2 SENSITIVE Sensitive     VANCOMYCIN 2 SENSITIVE Sensitive     TRIMETH/SULFA 160 RESISTANT Resistant     CLINDAMYCIN >=8 RESISTANT Resistant     RIFAMPIN <=0.5 SENSITIVE Sensitive     Inducible Clindamycin NEGATIVE Sensitive     * STAPHYLOCOCCUS EPIDERMIDIS  Blood Culture ID Panel (Reflexed)     Status: Abnormal   Collection Time: 10/04/20 12:50 PM  Result Value Ref Range Status   Enterococcus faecalis NOT DETECTED NOT DETECTED Final    Enterococcus Faecium NOT DETECTED NOT DETECTED Final   Listeria monocytogenes NOT DETECTED NOT DETECTED Final   Staphylococcus species DETECTED (A) NOT DETECTED Final    Comment: CRITICAL RESULT CALLED TO, READ BACK BY AND VERIFIED WITH: A. Grandville Silos RN 16:20 10/05/20 (wilsonm)    Staphylococcus aureus (BCID) NOT DETECTED NOT DETECTED Final   Staphylococcus epidermidis DETECTED (A) NOT DETECTED Final    Comment: Methicillin (oxacillin) resistant coagulase negative staphylococcus. Possible blood culture  contaminant (unless isolated from more than one blood culture draw or clinical case suggests pathogenicity). No antibiotic treatment is indicated for blood  culture contaminants. CRITICAL RESULT CALLED TO, READ BACK BY AND VERIFIED WITH: A. Grandville Silos RN 16:20 10/05/20 (wilsonm)    Staphylococcus lugdunensis NOT DETECTED NOT DETECTED Final   Streptococcus species NOT DETECTED NOT DETECTED Final   Streptococcus agalactiae NOT DETECTED NOT DETECTED Final   Streptococcus pneumoniae NOT DETECTED NOT DETECTED Final   Streptococcus pyogenes NOT DETECTED NOT DETECTED Final   A.calcoaceticus-baumannii NOT DETECTED NOT DETECTED Final   Bacteroides fragilis NOT DETECTED NOT DETECTED Final   Enterobacterales NOT DETECTED NOT DETECTED Final   Enterobacter cloacae complex NOT DETECTED NOT DETECTED Final   Escherichia coli NOT DETECTED NOT DETECTED Final   Klebsiella aerogenes NOT DETECTED NOT DETECTED Final   Klebsiella oxytoca NOT DETECTED NOT DETECTED Final   Klebsiella pneumoniae NOT DETECTED NOT DETECTED Final   Proteus species NOT DETECTED NOT DETECTED Final   Salmonella species NOT DETECTED NOT DETECTED Final   Serratia marcescens NOT DETECTED NOT DETECTED Final   Haemophilus influenzae NOT DETECTED NOT DETECTED Final   Neisseria meningitidis NOT DETECTED NOT DETECTED Final   Pseudomonas aeruginosa NOT DETECTED NOT DETECTED Final   Stenotrophomonas maltophilia NOT DETECTED NOT DETECTED Final    Candida albicans NOT DETECTED NOT DETECTED Final   Candida auris NOT DETECTED NOT DETECTED Final   Candida glabrata NOT DETECTED NOT DETECTED Final   Candida krusei NOT DETECTED NOT DETECTED Final   Candida parapsilosis NOT DETECTED NOT DETECTED Final   Candida tropicalis NOT DETECTED NOT DETECTED Final   Cryptococcus neoformans/gattii NOT DETECTED NOT DETECTED Final   Methicillin resistance mecA/C DETECTED (A) NOT DETECTED Final    Comment: CRITICAL RESULT CALLED TO, READ BACK BY AND VERIFIED WITH: A. Grandville Silos RN 16:20 10/05/20 (wilsonm) Performed at [facility], [place] [address] [place], [place] [address]   Culture, blood (Routine X 2) w Reflex to ID Panel     Status: Abnormal   Collection Time: 10/04/20 12:51 PM   Specimen: BLOOD RIGHT HAND  Result Value Ref Range Status   Specimen Description BLOOD RIGHT HAND  Final   Special Requests   Final    BOTTLES DRAWN AEROBIC AND ANAEROBIC Blood Culture adequate volume   Culture  Setup Time   Final    GRAM POSITIVE COCCI AEROBIC BOTTLE ONLY CRITICAL VALUE NOTED.  VALUE IS CONSISTENT WITH PREVIOUSLY REPORTED AND CALLED VALUE.    Culture (A)  Final    STAPHYLOCOCCUS EPIDERMIDIS SUSCEPTIBILITIES PERFORMED ON PREVIOUS CULTURE WITHIN THE LAST 5 DAYS. Performed at [facility], [place] [address] [place], [place] [address]    Report Status 10/07/2020 FINAL  Final  C Difficile Quick Screen (NO PCR Reflex)     Status: None   Collection Time: 10/04/20  5:19 PM   Specimen: STOOL  Result Value Ref Range Status   C Diff antigen NEGATIVE NEGATIVE Final   C Diff toxin NEGATIVE NEGATIVE Final   C Diff interpretation No C. difficile detected.  Final    Comment: Performed at [facility], [place] [address], [place], [place] [address]  Culture, respiratory (non-expectorated)     Status: None   Collection Time: 10/05/20 10:14 PM   Specimen: Tracheal Aspirate; Respiratory  Result Value Ref Range Status   Specimen Description TRACHEAL  ASPIRATE  Final   Special Requests NONE  Final   Gram Stain   Final    MODERATE  WBC PRESENT, PREDOMINANTLY PMN MODERATE GRAM NEGATIVE RODS FEW YEAST RARE GRAM POSITIVE COCCI IN CHAINS Performed at [facility], [place] [address] [place], [place] [address]    Culture MODERATE KLEBSIELLA PNEUMONIAE  Final   Report Status 10/08/2020 FINAL  Final   Organism ID, Bacteria KLEBSIELLA PNEUMONIAE  Final      Susceptibility   Klebsiella pneumoniae - MIC*    AMPICILLIN RESISTANT Resistant     CEFAZOLIN <=4 SENSITIVE Sensitive     CEFEPIME <=0.12 SENSITIVE Sensitive     CEFTAZIDIME <=1 SENSITIVE Sensitive     CEFTRIAXONE <=0.25 SENSITIVE Sensitive     CIPROFLOXACIN >=4 RESISTANT Resistant     GENTAMICIN <=1 SENSITIVE Sensitive     IMIPENEM <=0.25 SENSITIVE Sensitive     TRIMETH/SULFA <=20 SENSITIVE Sensitive     AMPICILLIN/SULBACTAM 8 SENSITIVE Sensitive     PIP/TAZO 16 SENSITIVE Sensitive     * MODERATE KLEBSIELLA PNEUMONIAE  Culture, blood (routine x 2)     Status: None   Collection Time: 10/08/20  4:17 PM   Specimen: BLOOD RIGHT WRIST  Result Value Ref Range Status   Specimen Description BLOOD RIGHT WRIST  Final   Special Requests   Final    BOTTLES DRAWN AEROBIC ONLY Blood Culture results may not be optimal due to an excessive volume of blood received in culture bottles   Culture   Final    NO GROWTH 5 DAYS Performed at [facility], [place] [address], [place], [place] [address]    Report Status 10/13/2020 FINAL  Final  Culture, blood (routine x 2)     Status: None   Collection Time: 10/08/20  4:17 PM   Specimen: BLOOD RIGHT HAND  Result Value Ref Range Status   Specimen Description BLOOD RIGHT HAND  Final   Special Requests   Final    BOTTLES DRAWN AEROBIC ONLY Blood Culture results may not be optimal due to an excessive volume of blood received in culture bottles   Culture   Final    NO GROWTH 5 DAYS Performed at [facility], [place] [address] [place], [place]  [address]    Report Status 10/13/2020 FINAL  Final  Culture, respiratory (non-expectorated)     Status: None   Collection Time: 10/14/20  1:41 PM   Specimen: Tracheal Aspirate; Respiratory  Result Value Ref Range Status   Specimen Description TRACHEAL ASPIRATE  Final   Special Requests NONE  Final   Gram Stain   Final    RARE WBC PRESENT, PREDOMINANTLY PMN RARE GRAM NEGATIVE RODS    Culture   Final    RARE Normal respiratory flora-no Staph aureus or Pseudomonas seen Performed at [facility], [address] [place], [place] [address]    Report Status 10/17/2020 FINAL  Final  Culture, blood (routine x 2)     Status: Abnormal (Preliminary result)   Collection Time: 10/27/20  9:24 PM   Specimen: BLOOD  Result Value Ref Range Status   Specimen Description BLOOD RIGHT HAND  Final   Special Requests   Final    BOTTLES DRAWN AEROBIC ONLY Blood Culture adequate volume   Culture  Setup Time   Final    Organism ID to follow YEAST AEROBIC BOTTLE ONLY CRITICAL RESULT CALLED TO, READ BACK BY AND VERIFIED WITH: RN D [place] [id number] AT 1400 BY CM    Culture (A)  Final    CANDIDA GLABRATA Sent to [place] for further susceptibility testing. Performed  at [facility], [place] [address], [place], [place] [address]    Report Status PENDING  Incomplete  Culture, blood (routine x 2)     Status: None   Collection Time: 10/27/20  9:24 PM   Specimen: BLOOD  Result Value Ref Range Status   Specimen Description BLOOD RIGHT ARM  Final   Special Requests   Final    BOTTLES DRAWN AEROBIC ONLY Blood Culture adequate volume   Culture   Final    NO GROWTH 5 DAYS Performed at [facility], [address], [place], [place] [address]    Report Status 11/01/2020 FINAL  Final  Blood Culture ID Panel (Reflexed)     Status: Abnormal   Collection Time: 10/27/20  9:24 PM  Result Value Ref Range Status   Enterococcus faecalis NOT DETECTED NOT DETECTED Final   Enterococcus Faecium NOT  DETECTED NOT DETECTED Final   Listeria monocytogenes NOT DETECTED NOT DETECTED Final   Staphylococcus species NOT DETECTED NOT DETECTED Final   Staphylococcus aureus (BCID) NOT DETECTED NOT DETECTED Final   Staphylococcus epidermidis NOT DETECTED NOT DETECTED Final   Staphylococcus lugdunensis NOT DETECTED NOT DETECTED Final   Streptococcus species NOT DETECTED NOT DETECTED Final   Streptococcus agalactiae NOT DETECTED NOT DETECTED Final   Streptococcus pneumoniae NOT DETECTED NOT DETECTED Final   Streptococcus pyogenes NOT DETECTED NOT DETECTED Final   A.calcoaceticus-baumannii NOT DETECTED NOT DETECTED Final   Bacteroides fragilis NOT DETECTED NOT DETECTED Final   Enterobacterales NOT DETECTED NOT DETECTED Final   Enterobacter cloacae complex NOT DETECTED NOT DETECTED Final   Escherichia coli NOT DETECTED NOT DETECTED Final   Klebsiella aerogenes NOT DETECTED NOT DETECTED Final   Klebsiella oxytoca NOT DETECTED NOT DETECTED Final   Klebsiella pneumoniae NOT DETECTED NOT DETECTED Final   Proteus species NOT DETECTED NOT DETECTED Final   Salmonella species NOT DETECTED NOT DETECTED Final   Serratia marcescens NOT DETECTED NOT DETECTED Final   Haemophilus influenzae NOT DETECTED NOT DETECTED Final   Neisseria meningitidis NOT DETECTED NOT DETECTED Final   Pseudomonas aeruginosa NOT DETECTED NOT DETECTED Final   Stenotrophomonas maltophilia NOT DETECTED NOT DETECTED Final   Candida albicans NOT DETECTED NOT DETECTED Final   Candida auris NOT DETECTED NOT DETECTED Final   Candida glabrata DETECTED (A) NOT DETECTED Final    Comment: CRITICAL RESULT CALLED TO, READ BACK BY AND VERIFIED WITH: PHARMD M MITCHELL [id number] AT 1350 BY CM    Candida krusei NOT DETECTED NOT DETECTED Final   Candida parapsilosis NOT DETECTED NOT DETECTED Final   Candida tropicalis NOT DETECTED NOT DETECTED Final   Cryptococcus neoformans/gattii NOT DETECTED NOT DETECTED Final    Comment: Performed at [facility], [address], [place], [place] [address]  Gram stain     Status: None   Collection Time: 10/31/20  3:16 PM   Specimen: Abdomen; Peritoneal Fluid  Result Value Ref Range Status   Specimen Description FLUID PERITONEAL ABDOMEN  Final   Special Requests NONE  Final   Gram Stain   Final    RARE WBC PRESENT,BOTH PMN AND MONONUCLEAR NO ORGANISMS SEEN Performed at [facility], [place] [address] [place], [place] [address]    Report Status 10/31/2020 FINAL  Final  Culture, body fluid-bottle     Status: None   Collection Time: 10/31/20  3:16 PM   Specimen: Fluid  Result Value Ref Range Status   Specimen Description FLUID PERITONEAL ABDOMEN  Final  Special Requests BOTTLES DRAWN AEROBIC AND ANAEROBIC  Final   Culture   Final    NO GROWTH 5 DAYS Performed at [facility], [address] [place], [place] [address]    Report Status 11/05/2020 FINAL  Final  Culture, blood (routine x 2)     Status: None (Preliminary result)   Collection Time: 11/03/20  3:32 PM   Specimen: BLOOD  Result Value Ref Range Status   Specimen Description BLOOD LEFT ANTECUBITAL  Final   Special Requests   Final    BOTTLES DRAWN AEROBIC AND ANAEROBIC Blood Culture results may not be optimal due to an inadequate volume of blood received in culture bottles   Culture   Final    NO GROWTH 4 DAYS Performed at [facility], [address], [place], [place] [address]    Report Status PENDING  Incomplete  Culture, blood (routine x 2)     Status: None (Preliminary result)   Collection Time: 11/03/20  3:45 PM   Specimen: BLOOD  Result Value Ref Range Status   Specimen Description BLOOD LEFT ANTECUBITAL  Final   Special Requests   Final    BOTTLES DRAWN AEROBIC ONLY Blood Culture adequate volume   Culture   Final    NO GROWTH 4 DAYS Performed at [facility], [address], [place], [place] [address]    Report Status PENDING  Incomplete  Culture, respiratory (non-expectorated)      Status: None   Collection Time: 11/03/20  4:44 PM   Specimen: Tracheal Aspirate; Respiratory  Result Value Ref Range Status   Specimen Description TRACHEAL ASPIRATE  Final   Special Requests NONE  Final   Gram Stain   Final    FEW WBC PRESENT, PREDOMINANTLY PMN ABUNDANT GRAM NEGATIVE RODS MODERATE GRAM POSITIVE RODS FEW GRAM POSITIVE COCCI FEW YEAST Performed at [facility], [address] [place], [place] [address]    Culture ABUNDANT KLEBSIELLA PNEUMONIAE  Final   Report Status 11/05/2020 FINAL  Final   Organism ID, Bacteria KLEBSIELLA PNEUMONIAE  Final      Susceptibility   Klebsiella pneumoniae - MIC*    AMPICILLIN RESISTANT Resistant     CEFAZOLIN <=4 SENSITIVE Sensitive     CEFEPIME <=0.12 SENSITIVE Sensitive     CEFTAZIDIME <=1 SENSITIVE Sensitive     CEFTRIAXONE <=0.25 SENSITIVE Sensitive     CIPROFLOXACIN 1 SENSITIVE Sensitive     GENTAMICIN <=1 SENSITIVE Sensitive     IMIPENEM <=0.25 SENSITIVE Sensitive     TRIMETH/SULFA <=20 SENSITIVE Sensitive     AMPICILLIN/SULBACTAM 4 SENSITIVE Sensitive     PIP/TAZO <=4 SENSITIVE Sensitive     * ABUNDANT KLEBSIELLA PNEUMONIAE    Coagulation Studies: No results for input(s): LABPROT, INR in the last 72 hours.  Urinalysis: No results for input(s): COLORURINE, LABSPEC, PHURINE, GLUCOSEU, HGBUR, BILIRUBINUR, KETONESUR, PROTEINUR, UROBILINOGEN, NITRITE, LEUKOCYTESUR in the last 72 hours.  Invalid input(s): APPERANCEUR    Imaging: No results found.   Medications:     iohexol, lidocaine  Assessment/ Plan:  54 y.o. male  with a PMHx of ESRD on HD, C. 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   1.  ESRD on HD.  Patient due for hemodialysis treatment today.  Orders have been prepared.  2.  Hypotension.  Patient has midodrine and albumin available for blood pressure support.  3.  Anemia of chronic kidney disease.   Lab Results  Component Value Date   HGB 7.7 (L) 11/08/2020  Hemoglobin currently 7.7.  No immediate need for transfusion but recommend continued monitoring of CBC closely.  4.  Secondary hyperparathyroidism.  Phosphorus has been trending down recently.  Continue to monitor closely.  5.  Acute respiratory failure.  Continues to have coarse breath sounds bilateral.  Most recent chest x-ray showed bilateral basilar opacities.    LOS: 0 Jeremy Yu 11/17/20217:49 AM

## 2020-11-10 LAB — CBC
HCT: 25.3 % — ABNORMAL LOW (ref 39.0–52.0)
Hemoglobin: 7.5 g/dL — ABNORMAL LOW (ref 13.0–17.0)
MCH: 28.3 pg (ref 26.0–34.0)
MCHC: 29.6 g/dL — ABNORMAL LOW (ref 30.0–36.0)
MCV: 95.5 fL (ref 80.0–100.0)
Platelets: 825 10*3/uL — ABNORMAL HIGH (ref 150–400)
RBC: 2.65 MIL/uL — ABNORMAL LOW (ref 4.22–5.81)
RDW: 21.6 % — ABNORMAL HIGH (ref 11.5–15.5)
WBC: 19.1 10*3/uL — ABNORMAL HIGH (ref 4.0–10.5)
nRBC: 2 % — ABNORMAL HIGH (ref 0.0–0.2)

## 2020-11-10 LAB — RENAL FUNCTION PANEL
Albumin: 2.1 g/dL — ABNORMAL LOW (ref 3.5–5.0)
Anion gap: 13 (ref 5–15)
BUN: 59 mg/dL — ABNORMAL HIGH (ref 6–20)
CO2: 24 mmol/L (ref 22–32)
Calcium: 11.7 mg/dL — ABNORMAL HIGH (ref 8.9–10.3)
Chloride: 95 mmol/L — ABNORMAL LOW (ref 98–111)
Creatinine, Ser: 3.04 mg/dL — ABNORMAL HIGH (ref 0.61–1.24)
GFR, Estimated: 24 mL/min — ABNORMAL LOW (ref >60)
Glucose, Bld: 145 mg/dL — ABNORMAL HIGH (ref 70–99)
Phosphorus: 3.4 mg/dL (ref 2.5–4.6)
Potassium: 3.8 mmol/L (ref 3.5–5.1)
Sodium: 132 mmol/L — ABNORMAL LOW (ref 135–145)

## 2020-11-10 LAB — MAGNESIUM: Magnesium: 2 mg/dL (ref 1.7–2.4)

## 2020-11-10 LAB — TRIGLYCERIDES: Triglycerides: 242 mg/dL — ABNORMAL HIGH (ref <150)

## 2020-11-10 NOTE — Progress Notes (Signed)
[facility]  ROUNDING NOTE   Subjective:  Patient evaluated at bedside. Due for hemodialysis today. Lethargic but arousable.  Objective:  Vital signs in last 24 hours:  Temperature 96.9 pulse 91 respirations 33 blood pressure 144/82   Physical Exam: General:  Chronically ill-appearing  Head:  Normocephalic, atraumatic. Moist oral mucosal membranes  Eyes:  Anicteric  Neck:  Supple  Lungs:   Coarse breath sounds bilateral  Heart:  S1S2 no rubs  Abdomen:   Soft, nontender, bowel sounds present, PEG in place, colostomy  Extremities:  Right AKA  Neurologic:  Awake, alert, follows commands  Skin:  No lesions  Access:  Left upper extremity AV fistula    Basic Metabolic Panel: Recent Labs  Lab 11/06/20 0630 11/08/20 0517 11/10/20 0358  NA 131* 133* 132*  K 3.7 4.0 3.8  CL 94* 97* 95*  CO2 $Re'25 26 24  'GTY$ GLUCOSE 151* 141* 145*  BUN 91* 71* 59*  CREATININE 3.59* 3.10* 3.04*  CALCIUM 11.8* 11.7* 11.7*  MG 2.4 2.1 2.0  PHOS 1.2* 1.9* 3.4    Liver Function Tests: Recent Labs  Lab 11/06/20 0630 11/08/20 0517 11/10/20 0358  ALBUMIN 1.9* 2.2* 2.1*   No results for input(s): LIPASE, AMYLASE in the last 168 hours. No results for input(s): AMMONIA in the last 168 hours.  CBC: Recent Labs  Lab 11/04/20 0642 11/06/20 0630 11/08/20 0517 11/10/20 0358  WBC 19.1* 22.1* 24.3* 19.1*  HGB 7.6* 7.2* 7.7* 7.5*  HCT 26.0* 24.3* 26.1* 25.3*  MCV 97.4 96.0 97.8 95.5  PLT 763* 875* 897* 825*    Cardiac Enzymes: No results for input(s): CKTOTAL, CKMB, CKMBINDEX, TROPONINI in the last 168 hours.  BNP: Invalid input(s): POCBNP  CBG: No results for input(s): GLUCAP in the last 168 hours.  Microbiology: Results for orders placed or performed during the hospital encounter of 09/22/20  Culture, respiratory (non-expectorated)     Status: None   Collection Time: 09/23/20 11:25 AM   Specimen: Tracheal Aspirate; Respiratory  Result Value Ref Range Status   Specimen  Description TRACHEAL ASPIRATE  Final   Special Requests NONE  Final   Gram Stain   Final    RARE WBC PRESENT,BOTH PMN AND MONONUCLEAR NO ORGANISMS SEEN Performed at [facility], [address], [place], [place] [address]    Culture FEW KLEBSIELLA PNEUMONIAE  Final   Report Status 09/25/2020 FINAL  Final   Organism ID, Bacteria KLEBSIELLA PNEUMONIAE  Final      Susceptibility   Klebsiella pneumoniae - MIC*    AMPICILLIN >=32 RESISTANT Resistant     CEFAZOLIN <=4 SENSITIVE Sensitive     CEFEPIME 0.25 SENSITIVE Sensitive     CEFTAZIDIME <=1 SENSITIVE Sensitive     CEFTRIAXONE 1 SENSITIVE Sensitive     CIPROFLOXACIN <=0.25 SENSITIVE Sensitive     GENTAMICIN <=1 SENSITIVE Sensitive     IMIPENEM <=0.25 SENSITIVE Sensitive     TRIMETH/SULFA <=20 SENSITIVE Sensitive     AMPICILLIN/SULBACTAM 16 INTERMEDIATE Intermediate     PIP/TAZO 64 INTERMEDIATE Intermediate     * FEW KLEBSIELLA PNEUMONIAE  Culture, blood (routine x 2)     Status: None   Collection Time: 09/23/20  4:26 PM   Specimen: BLOOD  Result Value Ref Range Status   Specimen Description BLOOD BLOOD RIGHT HAND  Final   Special Requests   Final    AEROBIC BOTTLE ONLY Blood Culture results may not be optimal due to an inadequate volume of blood received in culture  bottles   Culture   Final    NO GROWTH 5 DAYS Performed at [facility], [place] [address] [place], [place] [address]    Report Status 09/28/2020 FINAL  Final  Culture, blood (single)     Status: None   Collection Time: 09/24/20  6:00 AM   Specimen: BLOOD RIGHT HAND  Result Value Ref Range Status   Specimen Description BLOOD RIGHT HAND  Final   Special Requests   Final    BOTTLES DRAWN AEROBIC ONLY Blood Culture results may not be optimal due to an inadequate volume of blood received in culture bottles   Culture   Final    NO GROWTH 5 DAYS Performed at [facility], [place] [address] [place], [place] [address]    Report Status 09/29/2020 FINAL   Final  Body fluid culture     Status: None   Collection Time: 09/29/20 12:27 PM   Specimen: PATH Cytology Peritoneal fluid  Result Value Ref Range Status   Specimen Description PERITONEAL FLUID  Final   Special Requests NONE  Final   Gram Stain   Final    WBC PRESENT,BOTH PMN AND MONONUCLEAR NO ORGANISMS SEEN CYTOSPIN SMEAR    Culture   Final    NO GROWTH 3 DAYS Performed at [facility], [address], [place], [place] [address]    Report Status 10/03/2020 FINAL  Final  Culture, blood (Routine X 2) w Reflex to ID Panel     Status: Abnormal   Collection Time: 10/04/20 12:50 PM   Specimen: BLOOD  Result Value Ref Range Status   Specimen Description BLOOD RIGHT ANTECUBITAL  Final   Special Requests   Final    BOTTLES DRAWN AEROBIC AND ANAEROBIC Blood Culture adequate volume   Culture  Setup Time   Final    GRAM POSITIVE COCCI IN CLUSTERS AEROBIC BOTTLE ONLY CRITICAL RESULT CALLED TO, READ BACK BY AND VERIFIED WITH: A. Grandville Silos RN 16:20 10/05/20 (wilsonm) Performed at [facility], [place] [address] [place], [place] [address]    Culture STAPHYLOCOCCUS EPIDERMIDIS (A)  Final   Report Status 10/07/2020 FINAL  Final   Organism ID, Bacteria STAPHYLOCOCCUS EPIDERMIDIS  Final      Susceptibility   Staphylococcus epidermidis - MIC*    CIPROFLOXACIN >=8 RESISTANT Resistant     ERYTHROMYCIN >=8 RESISTANT Resistant     GENTAMICIN >=16 RESISTANT Resistant     OXACILLIN >=4 RESISTANT Resistant     TETRACYCLINE 2 SENSITIVE Sensitive     VANCOMYCIN 2 SENSITIVE Sensitive     TRIMETH/SULFA 160 RESISTANT Resistant     CLINDAMYCIN >=8 RESISTANT Resistant     RIFAMPIN <=0.5 SENSITIVE Sensitive     Inducible Clindamycin NEGATIVE Sensitive     * STAPHYLOCOCCUS EPIDERMIDIS  Blood Culture ID Panel (Reflexed)     Status: Abnormal   Collection Time: 10/04/20 12:50 PM  Result Value Ref Range Status   Enterococcus faecalis NOT DETECTED NOT DETECTED Final   Enterococcus Faecium NOT  DETECTED NOT DETECTED Final   Listeria monocytogenes NOT DETECTED NOT DETECTED Final   Staphylococcus species DETECTED (A) NOT DETECTED Final    Comment: CRITICAL RESULT CALLED TO, READ BACK BY AND VERIFIED WITH: A. Grandville Silos RN 16:20 10/05/20 ([email or web address])    Staphylococcus aureus (BCID) NOT DETECTED NOT DETECTED Final   Staphylococcus epidermidis DETECTED (A) NOT DETECTED Final    Comment: Methicillin (oxacillin) resistant coagulase negative staphylococcus. Possible blood culture contaminant (unless isolated from more than  one blood culture draw or clinical case suggests pathogenicity). No antibiotic treatment is indicated for blood  culture contaminants. CRITICAL RESULT CALLED TO, READ BACK BY AND VERIFIED WITH: A. Grandville Silos RN 16:20 10/05/20 (wilsonm)    Staphylococcus lugdunensis NOT DETECTED NOT DETECTED Final   Streptococcus species NOT DETECTED NOT DETECTED Final   Streptococcus agalactiae NOT DETECTED NOT DETECTED Final   Streptococcus pneumoniae NOT DETECTED NOT DETECTED Final   Streptococcus pyogenes NOT DETECTED NOT DETECTED Final   A.calcoaceticus-baumannii NOT DETECTED NOT DETECTED Final   Bacteroides fragilis NOT DETECTED NOT DETECTED Final   Enterobacterales NOT DETECTED NOT DETECTED Final   Enterobacter cloacae complex NOT DETECTED NOT DETECTED Final   Escherichia coli NOT DETECTED NOT DETECTED Final   Klebsiella aerogenes NOT DETECTED NOT DETECTED Final   Klebsiella oxytoca NOT DETECTED NOT DETECTED Final   Klebsiella pneumoniae NOT DETECTED NOT DETECTED Final   Proteus species NOT DETECTED NOT DETECTED Final   Salmonella species NOT DETECTED NOT DETECTED Final   Serratia marcescens NOT DETECTED NOT DETECTED Final   Haemophilus influenzae NOT DETECTED NOT DETECTED Final   Neisseria meningitidis NOT DETECTED NOT DETECTED Final   Pseudomonas aeruginosa NOT DETECTED NOT DETECTED Final   Stenotrophomonas maltophilia NOT DETECTED NOT DETECTED Final   Candida albicans NOT  DETECTED NOT DETECTED Final   Candida auris NOT DETECTED NOT DETECTED Final   Candida glabrata NOT DETECTED NOT DETECTED Final   Candida krusei NOT DETECTED NOT DETECTED Final   Candida parapsilosis NOT DETECTED NOT DETECTED Final   Candida tropicalis NOT DETECTED NOT DETECTED Final   Cryptococcus neoformans/gattii NOT DETECTED NOT DETECTED Final   Methicillin resistance mecA/C DETECTED (A) NOT DETECTED Final    Comment: CRITICAL RESULT CALLED TO, READ BACK BY AND VERIFIED WITH: A. Grandville Silos RN 16:20 10/05/20 (wilsonm) Performed at [facility], [place] [address] [place], [place] [address]   Culture, blood (Routine X 2) w Reflex to ID Panel     Status: Abnormal   Collection Time: 10/04/20 12:51 PM   Specimen: BLOOD RIGHT HAND  Result Value Ref Range Status   Specimen Description BLOOD RIGHT HAND  Final   Special Requests   Final    BOTTLES DRAWN AEROBIC AND ANAEROBIC Blood Culture adequate volume   Culture  Setup Time   Final    GRAM POSITIVE COCCI AEROBIC BOTTLE ONLY CRITICAL VALUE NOTED.  VALUE IS CONSISTENT WITH PREVIOUSLY REPORTED AND CALLED VALUE.    Culture (A)  Final    STAPHYLOCOCCUS EPIDERMIDIS SUSCEPTIBILITIES PERFORMED ON PREVIOUS CULTURE WITHIN THE LAST 5 DAYS. Performed at [facility], [place] [address], [place], [place] [address]    Report Status 10/07/2020 FINAL  Final  C Difficile Quick Screen (NO PCR Reflex)     Status: None   Collection Time: 10/04/20  5:19 PM   Specimen: STOOL  Result Value Ref Range Status   C Diff antigen NEGATIVE NEGATIVE Final   C Diff toxin NEGATIVE NEGATIVE Final   C Diff interpretation No C. difficile detected.  Final    Comment: Performed at [facility], [place] [address], [place], [place] [address]  Culture, respiratory (non-expectorated)     Status: None   Collection Time: 10/05/20 10:14 PM   Specimen: Tracheal Aspirate; Respiratory  Result Value Ref Range Status   Specimen Description TRACHEAL ASPIRATE  Final    Special Requests NONE  Final   Gram Stain   Final    MODERATE WBC PRESENT, PREDOMINANTLY PMN MODERATE GRAM  NEGATIVE RODS FEW YEAST RARE GRAM POSITIVE COCCI IN CHAINS Performed at [facility], [place] [address], [place], [place] [address]    Culture MODERATE KLEBSIELLA PNEUMONIAE  Final   Report Status 10/08/2020 FINAL  Final   Organism ID, Bacteria KLEBSIELLA PNEUMONIAE  Final      Susceptibility   Klebsiella pneumoniae - MIC*    AMPICILLIN RESISTANT Resistant     CEFAZOLIN <=4 SENSITIVE Sensitive     CEFEPIME <=0.12 SENSITIVE Sensitive     CEFTAZIDIME <=1 SENSITIVE Sensitive     CEFTRIAXONE <=0.25 SENSITIVE Sensitive     CIPROFLOXACIN >=4 RESISTANT Resistant     GENTAMICIN <=1 SENSITIVE Sensitive     IMIPENEM <=0.25 SENSITIVE Sensitive     TRIMETH/SULFA <=20 SENSITIVE Sensitive     AMPICILLIN/SULBACTAM 8 SENSITIVE Sensitive     PIP/TAZO 16 SENSITIVE Sensitive     * MODERATE KLEBSIELLA PNEUMONIAE  Culture, blood (routine x 2)     Status: None   Collection Time: 10/08/20  4:17 PM   Specimen: BLOOD RIGHT WRIST  Result Value Ref Range Status   Specimen Description BLOOD RIGHT WRIST  Final   Special Requests   Final    BOTTLES DRAWN AEROBIC ONLY Blood Culture results may not be optimal due to an excessive volume of blood received in culture bottles   Culture   Final    NO GROWTH 5 DAYS Performed at [facility], [place] [address] [place], [place] [address]    Report Status 10/13/2020 FINAL  Final  Culture, blood (routine x 2)     Status: None   Collection Time: 10/08/20  4:17 PM   Specimen: BLOOD RIGHT HAND  Result Value Ref Range Status   Specimen Description BLOOD RIGHT HAND  Final   Special Requests   Final    BOTTLES DRAWN AEROBIC ONLY Blood Culture results may not be optimal due to an excessive volume of blood received in culture bottles   Culture   Final    NO GROWTH 5 DAYS Performed at [facility], [place] [address], [place], [place] [address]    Report  Status 10/13/2020 FINAL  Final  Culture, respiratory (non-expectorated)     Status: None   Collection Time: 10/14/20  1:41 PM   Specimen: Tracheal Aspirate; Respiratory  Result Value Ref Range Status   Specimen Description TRACHEAL ASPIRATE  Final   Special Requests NONE  Final   Gram Stain   Final    RARE WBC PRESENT, PREDOMINANTLY PMN RARE GRAM NEGATIVE RODS    Culture   Final    RARE Normal respiratory flora-no Staph aureus or Pseudomonas seen Performed at [facility], [address], [place],  [address]    Report Status 10/17/2020 FINAL  Final  Culture, blood (routine x 2)     Status: Abnormal (Preliminary result)   Collection Time: 10/27/20  9:24 PM   Specimen: BLOOD  Result Value Ref Range Status   Specimen Description BLOOD RIGHT HAND  Final   Special Requests   Final    BOTTLES DRAWN AEROBIC ONLY Blood Culture adequate volume   Culture  Setup Time   Final    Organism ID to follow YEAST AEROBIC BOTTLE ONLY CRITICAL RESULT CALLED TO, READ BACK BY AND VERIFIED WITH: RN D [place] [id number] AT 1400 BY CM    Culture (A)  Final    CANDIDA GLABRATA Sent to [facility] for further susceptibility testing. Performed at [facility], [address] [place], [place] [address]    Report Status PENDING  Incomplete  Culture, blood (routine x 2)     Status: None   Collection Time: 10/27/20  9:24 PM   Specimen: BLOOD  Result Value Ref Range Status   Specimen Description BLOOD RIGHT ARM  Final   Special Requests   Final    BOTTLES DRAWN AEROBIC ONLY Blood Culture adequate volume   Culture   Final    NO GROWTH 5 DAYS Performed at [facility], [address] [place], [place] [address]    Report Status 11/01/2020 FINAL  Final  Blood Culture ID Panel (Reflexed)     Status: Abnormal   Collection Time: 10/27/20  9:24 PM  Result Value Ref Range Status   Enterococcus faecalis NOT DETECTED NOT DETECTED Final   Enterococcus Faecium NOT DETECTED NOT DETECTED  Final   Listeria monocytogenes NOT DETECTED NOT DETECTED Final   Staphylococcus species NOT DETECTED NOT DETECTED Final   Staphylococcus aureus (BCID) NOT DETECTED NOT DETECTED Final   Staphylococcus epidermidis NOT DETECTED NOT DETECTED Final   Staphylococcus lugdunensis NOT DETECTED NOT DETECTED Final   Streptococcus species NOT DETECTED NOT DETECTED Final   Streptococcus agalactiae NOT DETECTED NOT DETECTED Final   Streptococcus pneumoniae NOT DETECTED NOT DETECTED Final   Streptococcus pyogenes NOT DETECTED NOT DETECTED Final   A.calcoaceticus-baumannii NOT DETECTED NOT DETECTED Final   Bacteroides fragilis NOT DETECTED NOT DETECTED Final   Enterobacterales NOT DETECTED NOT DETECTED Final   Enterobacter cloacae complex NOT DETECTED NOT DETECTED Final   Escherichia coli NOT DETECTED NOT DETECTED Final   Klebsiella aerogenes NOT DETECTED NOT DETECTED Final   Klebsiella oxytoca NOT DETECTED NOT DETECTED Final   Klebsiella pneumoniae NOT DETECTED NOT DETECTED Final   Proteus species NOT DETECTED NOT DETECTED Final   Salmonella species NOT DETECTED NOT DETECTED Final   Serratia marcescens NOT DETECTED NOT DETECTED Final   Haemophilus influenzae NOT DETECTED NOT DETECTED Final   Neisseria meningitidis NOT DETECTED NOT DETECTED Final   Pseudomonas aeruginosa NOT DETECTED NOT DETECTED Final   Stenotrophomonas maltophilia NOT DETECTED NOT DETECTED Final   Candida albicans NOT DETECTED NOT DETECTED Final   Candida auris NOT DETECTED NOT DETECTED Final   Candida glabrata DETECTED (A) NOT DETECTED Final    Comment: CRITICAL RESULT CALLED TO, READ BACK BY AND VERIFIED WITH: PHARMD M MITCHELL [id number] AT 1350 BY CM    Candida krusei NOT DETECTED NOT DETECTED Final   Candida parapsilosis NOT DETECTED NOT DETECTED Final   Candida tropicalis NOT DETECTED NOT DETECTED Final   Cryptococcus neoformans/gattii NOT DETECTED NOT DETECTED Final    Comment: Performed at [facility], [address] [place], [place] [address]  Gram stain     Status: None   Collection Time: 10/31/20  3:16 PM   Specimen: Abdomen; Peritoneal Fluid  Result Value Ref Range Status   Specimen Description FLUID PERITONEAL ABDOMEN  Final   Special Requests NONE  Final   Gram Stain   Final    RARE WBC PRESENT,BOTH PMN AND MONONUCLEAR NO ORGANISMS SEEN Performed at [facility], [place] [address], [place], [place] [address]    Report Status 10/31/2020 FINAL  Final  Culture, body fluid-bottle     Status: None   Collection Time: 10/31/20  3:16 PM   Specimen: Fluid  Result Value Ref Range Status   Specimen Description FLUID PERITONEAL ABDOMEN  Final   Special Requests BOTTLES DRAWN  AEROBIC AND ANAEROBIC  Final   Culture   Final    NO GROWTH 5 DAYS Performed at [facility], [place] [address] [place], [place] [address]    Report Status 11/05/2020 FINAL  Final  Culture, blood (routine x 2)     Status: None   Collection Time: 11/03/20  3:32 PM   Specimen: BLOOD  Result Value Ref Range Status   Specimen Description BLOOD LEFT ANTECUBITAL  Final   Special Requests   Final    BOTTLES DRAWN AEROBIC AND ANAEROBIC Blood Culture results may not be optimal due to an inadequate volume of blood received in culture bottles   Culture   Final    NO GROWTH 5 DAYS Performed at [facility], [place] [address] [place], [place] [address]    Report Status 11/08/2020 FINAL  Final  Culture, blood (routine x 2)     Status: None   Collection Time: 11/03/20  3:45 PM   Specimen: BLOOD  Result Value Ref Range Status   Specimen Description BLOOD LEFT ANTECUBITAL  Final   Special Requests   Final    BOTTLES DRAWN AEROBIC ONLY Blood Culture adequate volume   Culture   Final    NO GROWTH 5 DAYS Performed at [facility], [place] [address] [place], [place] [address]    Report Status 11/08/2020 FINAL  Final  Culture, respiratory (non-expectorated)     Status: None   Collection Time: 11/03/20  4:44 PM    Specimen: Tracheal Aspirate; Respiratory  Result Value Ref Range Status   Specimen Description TRACHEAL ASPIRATE  Final   Special Requests NONE  Final   Gram Stain   Final    FEW WBC PRESENT, PREDOMINANTLY PMN ABUNDANT GRAM NEGATIVE RODS MODERATE GRAM POSITIVE RODS FEW GRAM POSITIVE COCCI FEW YEAST Performed at [facility], [address], [place], [place] [address]    Culture ABUNDANT KLEBSIELLA PNEUMONIAE  Final   Report Status 11/05/2020 FINAL  Final   Organism ID, Bacteria KLEBSIELLA PNEUMONIAE  Final      Susceptibility   Klebsiella pneumoniae - MIC*    AMPICILLIN RESISTANT Resistant     CEFAZOLIN <=4 SENSITIVE Sensitive     CEFEPIME <=0.12 SENSITIVE Sensitive     CEFTAZIDIME <=1 SENSITIVE Sensitive     CEFTRIAXONE <=0.25 SENSITIVE Sensitive     CIPROFLOXACIN 1 SENSITIVE Sensitive     GENTAMICIN <=1 SENSITIVE Sensitive     IMIPENEM <=0.25 SENSITIVE Sensitive     TRIMETH/SULFA <=20 SENSITIVE Sensitive     AMPICILLIN/SULBACTAM 4 SENSITIVE Sensitive     PIP/TAZO <=4 SENSITIVE Sensitive     * ABUNDANT KLEBSIELLA PNEUMONIAE    Coagulation Studies: No results for input(s): LABPROT, INR in the last 72 hours.  Urinalysis: No results for input(s): COLORURINE, LABSPEC, PHURINE, GLUCOSEU, HGBUR, BILIRUBINUR, KETONESUR, PROTEINUR, UROBILINOGEN, NITRITE, LEUKOCYTESUR in the last 72 hours.  Invalid input(s): APPERANCEUR    Imaging: No results found.   Medications:     iohexol, lidocaine  Assessment/ Plan:  54 y.o. male  with a PMHx of ESRD on HD, C. difficile colitis with subsequent colonic resection and colostomy placement, severe protein calorie malnutrition, diabetes mellitus type 2, anemia of chronic kidney disease, combined systolic and diastolic heart failure, hyperlipidemia, fatty liver disease, sacral decubitus ulcer, anemia of chronic kidney disease, secondary hyperparathyroidism, right above-the-knee amputation, who was admitted to [facility] on  09/22/2020 for ongoing care.   1.  ESRD on HD.  Patient due  for hemodialysis treatment per usual schedule on MWF.  2.  Hypotension.  Continue midodrine and albumin for blood pressure support during dialysis treatments.  3.  Anemia of chronic kidney disease.   Lab Results  Component Value Date   HGB 7.5 (L) 11/10/2020  Hemoglobin down further to 7.5.  Continue to monitor closely.  No immediate need for transfusion but consider for hemoglobin of 7 or less.  4.  Secondary hyperparathyroidism.  Phosphorus now up to 3.4.  5.  Acute respiratory failure.  Off the ventilator.  Breathing comfortably at the moment.    LOS: 0 Stacey Sago 11/19/20218:21 AM

## 2020-11-11 LAB — PTH, INTACT AND CALCIUM
Calcium, Total (PTH): 11.5 mg/dL — ABNORMAL HIGH (ref 8.7–10.2)
PTH: 29 pg/mL (ref 15–65)

## 2020-11-11 NOTE — Progress Notes (Signed)
PROGRESS NOTE    Jeremy Yu  AYO:[id number] DOB: [birth year] DOA: 09/22/2020  Brief Narrative:  [name] an 54 y.o.malewith medical history significant of end-stage renal disease on hemodialysis, diabetic nephropathy, chronic systolic and diastolic congestive heart failure, peripheral arterial disease, complete heart block, seizures, hyperlipidemia, hypertension, prior MI, GERD who was hospitalized from 07/17/2020 until 07/25/2020 for necrotizing soft tissue infection, sepsis. 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 He was discharged on 07/25/2020. Patient apparently was at [facility] and rehab facility. While undergoing dialysis on 08/19/2020 when he developed chills, rigors. Blood cultures were taken and he was admitted to [facility] on 08/19/2020. He was given IV vancomycin, ceftriaxone for probable sepsis. He was found to have diarrhea and stool for C. difficile was positive. Patient was placed on oral vancomycin. CT on 08/20/2020 confirmed pancolitis with large left inguinal hernia, airspace disease with concern for pneumonia. On 08/21/2020 when he became hypotensive, both IV and p.o. Flagyl was added to the p.o. vancomycin. Infectious disease was consulted with addition of Dificid. However, patient continued to worsen. On 08/23/2020 when he was transferred urgently to the ICU and intubated for airway protection because he was becoming encephalopathic. He was also given vasopressors for shock. Fecal transplant was attempted by GI at the bedside however, inability to pass the scope due to large nonreducible inguinal hernia. Surgery was consulted and patient was taken emergently for subtotal colectomy and ileostomy. Eravacyclinewas added for C. difficile/toxic megacolon. He was  seen by surgery with clearance for tube feeds on 08/25/2020. Postoperative course complicated by encephalopathy with gradual improvement. However, he had significant drop in platelet count on 08/27/2020. Hematology was consulted and they thought the thrombocytopenia was likely secondary to sepsis, recent surgery, critical illness, DIC. He was given platelet transfusion with additional PRBC due to oozing from the surgical site, drop in hemoglobin levels. He was also given vitamin K for ongoing liver dysfunction. He apparently received total of 8 units platelets. Repeat CT showed left lingular consolidation, bilateral effusions. Abdomen showed postop free fluid, gallstones. He had progressive thrombocytopenia, abnormal LFTs therefore Flagyl, Eravacyclinewas stopped. He had blood cultures on 08/28/2020 that showed staph. He was treated with empiric antibiotics. He was also treated with micafungin. On 09/04/2020 patient had PEA arrest during which he was intubated, subsequently underwent EGD with evidence of bleeding ulcer, gastritis. He continued to have oozing from his ileostomy and was receiving blood products. He was placed on PPI drip. Right upper quadrant ultrasound showed possible cholecystitis. He was to undergo drain placement but underwent HIDA on 09/09/2020, gallbladder was not visualized. On 09/10/2020 when he had large amount of blood from the stoma and received PRBC. He had EGD with bleeding ulcer that was clipped. On 09/11/2020 when he had paracentesis with 5.2 L removed. Patient had fluid cultures grew VRE for which he was treated with daptomycin for 14 days. He failed ventilator weaning efforts therefore tracheostomy was done on 09/14/2020. He has unstageable sacral pressure ulcer for which he received wound care. He was transferred and admitted to [facility] on 09/22/2020. -He previously had a trach but now decannulated. He has received multiple antibiotics for Klebsiella  pneumonia and also for worsening leukocytosis with fevers.   -As soon as antibiotics are discontinued he starts having fevers along with worsening leukocytosis.   WBC count worsened again with low-grade fevers.  His blood cultures from 10/27/2020 showed Candida glabrata.  He is started on treatment with micafungin.   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 He had severe sepsis with septic shock at the acute facility. He also had PEA arrest at the outside facility and had to be intubated.He had pneumonia with Klebsiella.  He received treatment with multiple rounds of antibiotics including IV vancomycin, meropenem, Flagyl.  He is currently decannulated. Respiratory status appears to be stable at this time.  He is however, very high risk for aspiration and recurrent aspiration pneumonia.  Sepsis with shock: Patient was on pressors at the outside facility. Shock resolved.  He already completed multiple rounds of antibiotics.  However, as soon as antibiotics are stopped he starts having fever with worsening leukocytosis. He is high risk for recurrent sepsis.  He recently received treatment with IV meropenem, Flagyl.    After stopping the meropenem he started having leukocytosis with low-grade fevers again.  His blood cultures from 10/27/2020 showed Candida glabrata.  Requested the lab to set up susceptibilities.  Repeat  blood cultures ordered. He is on treatment with micafungin.  Tentative end date for the micafungin would be 11/17/2020.  Candidemia: As mentioned above blood cultures from 10/27/2020 showed Candida glabrata.  On treatment with micafungin. Requested lab to set up susceptibilities for the Candida.  Repeat blood cultures to document clearance.  Tentative end date for the micafungin will be 11/17/2020.  Leukocytosis: Treated with multiple rounds of antibiotics as mentioned above.  He completed treatment with IV meropenem, p.o. Flagyl.  WBC count improved initially but soon as antibiotics are discontinued he starts having worsening leukocytosis with low-grade fevers.  As mentioned above, his blood cultures from 10/27/2020 showed Candida glabrata.  On treatment with micafungin.  Due to worsening leukocytosis empiric meropenem added again for probable aspiration pneumonia, tentative end date 11/11/2020. Continue to monitor WBC count. He is high risk for recurrent sepsis.     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 Continue local wound care. Antibiotics as mentioned above.  He received p.o. vancomycin.  Monitor closely.  If he starts having worsening diarrhea recommend to send stool for C. Difficile.  Peritonitis: Patient had peritonitis at the acute facility with peritoneal fluid cultures that showed VRE. He status post treatment with 14 days of daptomycin.  CT of the abdomen and pelvis from 10/30/2020 showed fluid collection suspected to be ascites. He is status post paracentesis with removal of 100 mL of dark brown fluid.   Fluid analysis on Gram stain showing rare WBC both PMN and mononuclear, no organisms.  He received treatment with empiric antibiotics due to worsening leukocytosis. He is at high risk for recurrent peritonitis. Continue to monitor closely.  Repeat abdominal imaging  if needed.  Cholecystitis: He also had cholecystitis and is status post  drain placement. Antibiotics as mentioned above.  End-stage renal disease on dialysis: Dialysis per nephrology. Antibiotics renally dosed.  Necrotizing fasciitis status post right AKA: Continue local wound care.  Diabetes mellitus: Continue to monitor Accu-Cheks,  medications and management of diabetes per the primary team. He will need proper glycemic control in order to enable healing.  Bleeding ulcer/gastritis: Status post EGD and clipping at the acute facility. On PPI. Continue to monitor. Further management per primary team.  Sacrococcygeal pressure ulcer unstageable: Continue local wound care.  If worsening consider consulting surgery for possible debridement.  Thrombocytosis: Patient having elevated platelet count.  Continue to monitor.  Unfortunately unable to give aspirin because he also had GI bleed.  Anemia: Continue to monitor hemoglobin. Further management per the primary team.  Unfortunately due to his complex medical problems he is high risk for worsening and decompensation. Plan of care discussed with the patient, primary team and pharmacy.   Subjective: He is having worsening leukocytosis again.   Objective: Vitals: Temperature 96.9, pulse 92, respiratory rate 30, blood pressure 140/82  Examination: Constitutional:Ill-appearing male, Awake Head:Atraumatic, normocephalic Eyes:pupils equal and reactive, legally blind ENMT: external ears and nose appear normal, normal hearing, Lips appear normal,moist oral mucosa Neck: supple CVS: S1-S2  Respiratory:Rhonchi, no wheezing Abdomen: Soft, nontender, wound VAC in place, right upper quadrant drain, ostomy, PEG tube Musculoskeletal:Right lower extremity AKA, left upper extremity AV fistula Neuro:Legally blind, grossly nonfocal Psych:stable mood Skin: no rashes   Data Reviewed: I have personally reviewed following labs and imaging studies  CBC: Recent Labs  Lab 11/06/20 0630 11/08/20 0517  11/10/20 0358  WBC 22.1* 24.3* 19.1*  HGB 7.2* 7.7* 7.5*  HCT 24.3* 26.1* 25.3*  MCV 96.0 97.8 95.5  PLT 875* 897* 825*    Basic Metabolic Panel: Recent Labs  Lab 11/06/20 0630 11/08/20 0517 11/10/20 0358  NA 131* 133* 132*  K 3.7 4.0 3.8  CL 94* 97* 95*  CO2 $Re'25 26 24  'ZWI$ GLUCOSE 151* 141* 145*  BUN 91* 71* 59*  CREATININE 3.59* 3.10* 3.04*  CALCIUM 11.8* 11.7* 11.7*  MG 2.4 2.1 2.0  PHOS 1.2* 1.9* 3.4    GFR: CrCl cannot be calculated (Unknown ideal weight.).  Liver Function Tests: Recent Labs  Lab 11/06/20 0630 11/08/20 0517 11/10/20 0358  ALBUMIN 1.9* 2.2* 2.1*    CBG: No results for input(s): GLUCAP in the last 168 hours.   Recent Results (from the past 240 hour(s))  Culture, blood (routine x 2)     Status: None   Collection Time: 11/03/20  3:32 PM   Specimen: BLOOD  Result Value Ref Range Status   Specimen Description BLOOD LEFT ANTECUBITAL  Final   Special Requests   Final    BOTTLES DRAWN AEROBIC AND ANAEROBIC Blood Culture results may not be optimal due to an inadequate volume of blood received in culture bottles   Culture   Final    NO GROWTH 5 DAYS Performed at [facility], [place] [address], [place], [place] [address]    Report Status 11/08/2020 FINAL  Final  Culture, blood (routine x 2)     Status: None   Collection Time: 11/03/20  3:45 PM   Specimen: BLOOD  Result Value Ref Range Status   Specimen Description BLOOD LEFT ANTECUBITAL  Final   Special Requests   Final    BOTTLES DRAWN AEROBIC ONLY Blood Culture adequate volume   Culture   Final    NO GROWTH 5 DAYS Performed at [facility], [place] [address], [place], [place] [address]    Report Status 11/08/2020 FINAL  Final  Culture, respiratory (non-expectorated)     Status: None   Collection Time: 11/03/20  4:44 PM  Specimen: Tracheal Aspirate; Respiratory  Result Value Ref Range Status   Specimen Description TRACHEAL ASPIRATE  Final   Special Requests NONE  Final   Gram  Stain   Final    FEW WBC PRESENT, PREDOMINANTLY PMN ABUNDANT GRAM NEGATIVE RODS MODERATE GRAM POSITIVE RODS FEW GRAM POSITIVE COCCI FEW YEAST Performed at [facility], [address] [place], [place] [address]    Culture ABUNDANT KLEBSIELLA PNEUMONIAE  Final   Report Status 11/05/2020 FINAL  Final   Organism ID, Bacteria KLEBSIELLA PNEUMONIAE  Final      Susceptibility   Klebsiella pneumoniae - MIC*    AMPICILLIN RESISTANT Resistant     CEFAZOLIN <=4 SENSITIVE Sensitive     CEFEPIME <=0.12 SENSITIVE Sensitive     CEFTAZIDIME <=1 SENSITIVE Sensitive     CEFTRIAXONE <=0.25 SENSITIVE Sensitive     CIPROFLOXACIN 1 SENSITIVE Sensitive     GENTAMICIN <=1 SENSITIVE Sensitive     IMIPENEM <=0.25 SENSITIVE Sensitive     TRIMETH/SULFA <=20 SENSITIVE Sensitive     AMPICILLIN/SULBACTAM 4 SENSITIVE Sensitive     PIP/TAZO <=4 SENSITIVE Sensitive     * ABUNDANT KLEBSIELLA PNEUMONIAE       Radiology Studies: No results found.   Scheduled Meds: Please see MAR   Yaakov Guthrie, MD  11/11/2020, 11:38 AM

## 2020-11-13 LAB — RENAL FUNCTION PANEL
Albumin: 2 g/dL — ABNORMAL LOW (ref 3.5–5.0)
Anion gap: 15 (ref 5–15)
BUN: 81 mg/dL — ABNORMAL HIGH (ref 6–20)
CO2: 25 mmol/L (ref 22–32)
Calcium: 11.6 mg/dL — ABNORMAL HIGH (ref 8.9–10.3)
Chloride: 95 mmol/L — ABNORMAL LOW (ref 98–111)
Creatinine, Ser: 3.95 mg/dL — ABNORMAL HIGH (ref 0.61–1.24)
GFR, Estimated: 17 mL/min — ABNORMAL LOW (ref >60)
Glucose, Bld: 109 mg/dL — ABNORMAL HIGH (ref 70–99)
Phosphorus: 5.1 mg/dL — ABNORMAL HIGH (ref 2.5–4.6)
Potassium: 4 mmol/L (ref 3.5–5.1)
Sodium: 135 mmol/L (ref 135–145)

## 2020-11-13 LAB — CBC
HCT: 25.2 % — ABNORMAL LOW (ref 39.0–52.0)
Hemoglobin: 7.5 g/dL — ABNORMAL LOW (ref 13.0–17.0)
MCH: 27.8 pg (ref 26.0–34.0)
MCHC: 29.8 g/dL — ABNORMAL LOW (ref 30.0–36.0)
MCV: 93.3 fL (ref 80.0–100.0)
Platelets: 785 10*3/uL — ABNORMAL HIGH (ref 150–400)
RBC: 2.7 MIL/uL — ABNORMAL LOW (ref 4.22–5.81)
RDW: 21.2 % — ABNORMAL HIGH (ref 11.5–15.5)
WBC: 22.7 10*3/uL — ABNORMAL HIGH (ref 4.0–10.5)
nRBC: 0.4 % — ABNORMAL HIGH (ref 0.0–0.2)

## 2020-11-13 LAB — CULTURE, BLOOD (ROUTINE X 2): Special Requests: ADEQUATE

## 2020-11-13 LAB — MAGNESIUM: Magnesium: 2.3 mg/dL (ref 1.7–2.4)

## 2020-11-13 NOTE — Progress Notes (Signed)
PROGRESS NOTE    Jeremy Yu  EFM:[id number] DOB: January 31, 1966 DOA: 09/22/2020  Brief Narrative:  Jeremy Yu an 54 Yu medical history significant of end-stage renal disease on hemodialysis, diabetic nephropathy, chronic systolic and diastolic congestive heart failure, peripheral arterial disease, complete heart block, seizures, hyperlipidemia, hypertension, prior MI, GERD who was hospitalized from 07/17/2020 until 07/25/2020 for necrotizing soft tissue infection, sepsis. 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 He was discharged on 07/25/2020. Patient apparently was at [facility] and rehab facility. While undergoing dialysis on 08/19/2020 when he developed chills, rigors. Blood cultures were taken and he was admitted to [facility] on 08/19/2020. He was given IV vancomycin, ceftriaxone for probable sepsis. He was found to have diarrhea and stool for C. difficile was positive. Patient was placed on oral vancomycin. CT on 08/20/2020 confirmed pancolitis with large left inguinal hernia, airspace disease with concern for pneumonia. On 08/21/2020 when he became hypotensive, both IV and p.o. Flagyl was added to the p.o. vancomycin. Infectious disease was consulted with addition of Dificid. However, patient continued to worsen. On 08/23/2020 when he was transferred urgently to the ICU and intubated for airway protection because he was becoming encephalopathic. He was also given vasopressors for shock. Fecal transplant was attempted by GI at the bedside however, inability to pass the scope due to large nonreducible inguinal hernia. Surgery was consulted and patient was taken emergently for subtotal colectomy and ileostomy. Eravacyclinewas added for C. difficile/toxic megacolon. He was  seen by surgery with clearance for tube feeds on 08/25/2020. Postoperative course complicated by encephalopathy with gradual improvement. However, he had significant drop in platelet count on 08/27/2020. Hematology was consulted and they thought the thrombocytopenia was likely secondary to sepsis, recent surgery, critical illness, DIC. He was given platelet transfusion with additional PRBC due to oozing from the surgical site, drop in hemoglobin levels. He was also given vitamin K for ongoing liver dysfunction. He apparently received total of 8 units platelets. Repeat CT showed left lingular consolidation, bilateral effusions. Abdomen showed postop free fluid, gallstones. He had progressive thrombocytopenia, abnormal LFTs therefore Flagyl, Eravacyclinewas stopped. He had blood cultures on 08/28/2020 that showed staph. He was treated with empiric antibiotics. He was also treated with micafungin. On 09/04/2020 patient had PEA arrest during which he was intubated, subsequently underwent EGD with evidence of bleeding ulcer, gastritis. He continued to have oozing from his ileostomy and was receiving blood products. He was placed on PPI drip. Right upper quadrant ultrasound showed possible cholecystitis. He was to undergo drain placement but underwent HIDA on 09/09/2020, gallbladder was not visualized. On 09/10/2020 when he had large amount of blood from the stoma and received PRBC. He had EGD with bleeding ulcer that was clipped. On 09/11/2020 when he had paracentesis with 5.2 L removed. Patient had fluid cultures grew VRE for which he was treated with daptomycin for 14 days. He failed ventilator weaning efforts therefore tracheostomy was done on 09/14/2020. He has unstageable sacral pressure ulcer for which he received wound care. He was transferred and admitted to [facility] on 09/22/2020. -He previously had a trach but now decannulated. He has received multiple antibiotics for Klebsiella  pneumonia and also for worsening leukocytosis with fevers.   -As soon as antibiotics are discontinued he starts having fevers along with worsening leukocytosis.   WBC count worsened again with low-grade fevers.  His blood cultures from 10/27/2020 showed Candida glabrata.  He is on treatment with micafungin. 11/13/2020: Despite being on treatment with the micafungin his WBC count again elevated.   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 He had severe sepsis with septic shock at the acute facility. He also had PEA arrest at the outside facility and had to be intubated.He had pneumonia with Klebsiella.  He received treatment with multiple rounds of antibiotics including IV vancomycin, meropenem, Flagyl.  He is currently decannulated. Respiratory status appears to be stable at this time. He is however, complaining of cough with some sputum and remains very high risk for aspiration and recurrent aspiration pneumonia. -If he continues to have symptoms we may need to consider CT of the chest to evaluate.  Could be done without contrast given the renal failure.  Sepsis with shock: Patient was on pressors at the outside facility. Shock resolved.  He already completed multiple rounds of antibiotics.  However, as soon as antibiotics are stopped he starts having fever with worsening leukocytosis. He is high risk for recurrent  sepsis.  He recently received treatment with IV meropenem, Flagyl.    After stopping the meropenem he started having leukocytosis with low-grade fevers again.  His blood cultures from 10/27/2020 showed Candida glabrata.  Repeat blood cultures from 11/03/2020 did not show any growth to date. He is on treatment with micafungin. Tentative end date for the micafungin would be 11/17/2020.  Candidemia: As mentioned above blood cultures from 10/27/2020 showed Candida glabrata.  On treatment with micafungin. Requested lab to set up susceptibilities for the Candida.  Repeat blood cultures to document clearance.  Tentative end date for the micafungin will be 11/17/2020.  Leukocytosis: Treated with multiple rounds of antibiotics as mentioned above.  He completed treatment with IV meropenem, p.o. Flagyl.  WBC count improved initially but soon as antibiotics are discontinued he starts having worsening leukocytosis with low-grade fevers.  As mentioned above, his blood cultures from 10/27/2020 showed Candida glabrata.  On treatment with micafungin.  Due to worsening leukocytosis empiric meropenem added again for probable aspiration pneumonia which she completed on 11/11/2020.  However, now again having elevated WBC count 22.7.   Etiology unclear.  He has sacrococcygeal pressure ulcer unstageable which could also be contributing to the leukocytosis. -Since he is complaining of cough with sputum would recommend to send for repeat sputum cultures.  If the sputum is again showing Klebsiella again will attempt treatment with tobramycin nebulizers plus or minus Avycaz depending on the susceptibility.  Continue to monitor WBC count. He is high risk for recurrent sepsis.  Fortunately blood cultures from 11/03/2020 did not show any growth.  However, if he is continuing to have worsening WBC count or fevers would recommend to send for pan cultures.  -He also had multiple CT's of the abdomen and pelvis the last imaging done on  11/02/2020.  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 Continue local wound care. Antibiotics as mentioned above.  He received p.o. vancomycin.  Monitor closely.  If  he starts having worsening diarrhea recommend to send stool for C. Difficile. -Due to worsening leukocytosis despite being on antimicrobials will start him on p.o. vancomycin to prevent C. difficile recurrence.  Peritonitis: Patient had peritonitis at the acute facility with peritoneal fluid cultures that showed VRE. He status post treatment with 14 days of daptomycin.  CT of the abdomen and pelvis from 10/30/2020 showed fluid collection suspected to be ascites. He is status post paracentesis with removal of 100 mL of dark brown fluid.   Fluid analysis on Gram stain showing rare WBC both PMN and mononuclear, no organisms.  He received treatment with empiric antibiotics due to worsening leukocytosis. He is at high risk for recurrent peritonitis. Continue to monitor closely.  Repeat abdominal imaging  if needed.  Cholecystitis: He also had cholecystitis and is status post drain placement.  Currently drain removed by surgery. Antibiotics as mentioned above.  Ascites: He continues to have recurrent ascites status post paracentesis x3.  Previously had peritonitis with VRE and completed antibiotic treatment for that.  Continue management per the primary team.  End-stage renal disease on dialysis: Dialysis per nephrology. Antibiotics renally dosed.  Necrotizing fasciitis status post right AKA: Continue local wound care.  Diabetes mellitus: Continue to monitor Accu-Cheks, medications and management of diabetes per the primary team. He will need proper glycemic control in order to enable healing.  Bleeding ulcer/gastritis: Status post EGD and clipping at the acute facility. On PPI. Continue to monitor. Further management per primary team.  Sacrococcygeal pressure ulcer unstageable:  Continue local wound care.  If worsening consider consulting surgery for possible debridement.  Thrombocytosis: Patient having elevated platelet count.  Continue to monitor.  Unfortunately unable to give aspirin because he also had GI bleed.  Anemia: Continue to monitor hemoglobin. Further management per the primary team.  Unfortunately due to his complex medical problems he is high risk for worsening and decompensation.  Overall poor prognosis. Plan of care discussed with the patient, primary team and pharmacy.   Subjective: He is having worsening leukocytosis again.  He is complaining of cough with some sputum.  Denies abdominal pain.  Objective: Vitals: Temperature 97.8, pulse 92, respiratory rate 28, blood pressure 139/84, pulse oximetry 98%  Examination: Constitutional:Ill-appearing male, Awake Head:Atraumatic, normocephalic Eyes:pupils equal and reactive, legally blind ENMT: external ears and nose appear normal, normal hearing, Lips appear normal,moist oral mucosa Neck: supple CVS: S1-S2  Respiratory:Occasional rhonchi, no wheezing Abdomen: Soft, nontender, postop abdominal wound, ostomy, PEG tube Musculoskeletal:Right lower extremity AKA, left upper extremity AV fistula Neuro:Legally blind, grossly nonfocal Psych:stable mood Skin: no rashes   Data Reviewed: I have personally reviewed following labs and imaging studies  CBC: Recent Labs  Lab 11/08/20 0517 11/10/20 0358 11/13/20 0621  WBC 24.3* 19.1* 22.7*  HGB 7.7* 7.5* 7.5*  HCT 26.1* 25.3* 25.2*  MCV 97.8 95.5 93.3  PLT 897* 825* 785*    Basic Metabolic Panel: Recent Labs  Lab 11/08/20 0517 11/10/20 0358 11/13/20 0621  NA 133* 132* 135  K 4.0 3.8 4.0  CL 97* 95* 95*  CO2 $Re'26 24 25  'bax$ GLUCOSE 141* 145* 109*  BUN 71* 59* 81*  CREATININE 3.10* 3.04* 3.95*  CALCIUM 11.7* 11.7*   11.5* 11.6*  MG 2.1 2.0 2.3  PHOS 1.9* 3.4 5.1*    GFR: CrCl cannot be calculated (Unknown ideal  weight.).  Liver Function Tests: Recent Labs  Lab 11/08/20 0517 11/10/20 0358 11/13/20 0621  ALBUMIN 2.2* 2.1* 2.0*    CBG: No results for  input(s): GLUCAP in the last 168 hours.   No results found for this or any previous visit (from the past 240 hour(s)).     Radiology Studies: No results found.   Scheduled Meds: Please see MAR   Yaakov Guthrie, MD  11/13/2020, 7:25 PM

## 2020-11-13 NOTE — Progress Notes (Signed)
[facility]  ROUNDING NOTE   Subjective:  Patient seen and evaluated at bedside. Lethargic but is arousable. Due for dialysis today.  Objective:  Vital signs in last 24 hours:  Temperature 97.8 pulse 92 respirations 22 blood pressure 135/77   Physical Exam: General:  Chronically ill-appearing  Head:  Normocephalic, atraumatic. Moist oral mucosal membranes  Eyes:  Anicteric  Neck:  Supple  Lungs:   Scattered rhonchi, normal effort  Heart:  S1S2 no rubs  Abdomen:   Soft, nontender, bowel sounds present, PEG in place, colostomy  Extremities:  Right AKA  Neurologic:  Lethargic, arousable  Skin:  No lesions  Access:  Left upper extremity AV fistula    Basic Metabolic Panel: Recent Labs  Lab 11/08/20 0517 11/10/20 0358 11/13/20 0621  NA 133* 132* 135  K 4.0 3.8 4.0  CL 97* 95* 95*  CO2 $Re'26 24 25  'AGm$ GLUCOSE 141* 145* 109*  BUN 71* 59* 81*  CREATININE 3.10* 3.04* 3.95*  CALCIUM 11.7* 11.7*  11.5* 11.6*  MG 2.1 2.0 2.3  PHOS 1.9* 3.4 5.1*    Liver Function Tests: Recent Labs  Lab 11/08/20 0517 11/10/20 0358 11/13/20 0621  ALBUMIN 2.2* 2.1* 2.0*   No results for input(s): LIPASE, AMYLASE in the last 168 hours. No results for input(s): AMMONIA in the last 168 hours.  CBC: Recent Labs  Lab 11/08/20 0517 11/10/20 0358 11/13/20 0621  WBC 24.3* 19.1* 22.7*  HGB 7.7* 7.5* 7.5*  HCT 26.1* 25.3* 25.2*  MCV 97.8 95.5 93.3  PLT 897* 825* 785*    Cardiac Enzymes: No results for input(s): CKTOTAL, CKMB, CKMBINDEX, TROPONINI in the last 168 hours.  BNP: Invalid input(s): POCBNP  CBG: No results for input(s): GLUCAP in the last 168 hours.  Microbiology: Results for orders placed or performed during the hospital encounter of 09/22/20  Culture, respiratory (non-expectorated)     Status: None   Collection Time: 09/23/20 11:25 AM   Specimen: Tracheal Aspirate; Respiratory  Result Value Ref Range Status   Specimen Description TRACHEAL ASPIRATE  Final    Special Requests NONE  Final   Gram Stain   Final    RARE WBC PRESENT,BOTH PMN AND MONONUCLEAR NO ORGANISMS SEEN Performed at [facility], [address] [place],  [address]    Culture FEW KLEBSIELLA PNEUMONIAE  Final   Report Status 09/25/2020 FINAL  Final   Organism ID, Bacteria KLEBSIELLA PNEUMONIAE  Final      Susceptibility   Klebsiella pneumoniae - MIC*    AMPICILLIN >=32 RESISTANT Resistant     CEFAZOLIN <=4 SENSITIVE Sensitive     CEFEPIME 0.25 SENSITIVE Sensitive     CEFTAZIDIME <=1 SENSITIVE Sensitive     CEFTRIAXONE 1 SENSITIVE Sensitive     CIPROFLOXACIN <=0.25 SENSITIVE Sensitive     GENTAMICIN <=1 SENSITIVE Sensitive     IMIPENEM <=0.25 SENSITIVE Sensitive     TRIMETH/SULFA <=20 SENSITIVE Sensitive     AMPICILLIN/SULBACTAM 16 INTERMEDIATE Intermediate     PIP/TAZO 64 INTERMEDIATE Intermediate     * FEW KLEBSIELLA PNEUMONIAE  Culture, blood (routine x 2)     Status: None   Collection Time: 09/23/20  4:26 PM   Specimen: BLOOD  Result Value Ref Range Status   Specimen Description BLOOD BLOOD RIGHT HAND  Final   Special Requests   Final    AEROBIC BOTTLE ONLY Blood Culture results may not be optimal due to an inadequate volume of blood received in culture bottles   Culture  Final    NO GROWTH 5 DAYS Performed at [facility], [place] [address] [place], [place] [address]    Report Status 09/28/2020 FINAL  Final  Culture, blood (single)     Status: None   Collection Time: 09/24/20  6:00 AM   Specimen: BLOOD RIGHT HAND  Result Value Ref Range Status   Specimen Description BLOOD RIGHT HAND  Final   Special Requests   Final    BOTTLES DRAWN AEROBIC ONLY Blood Culture results may not be optimal due to an inadequate volume of blood received in culture bottles   Culture   Final    NO GROWTH 5 DAYS Performed at [facility], [place] [address] [place], [place] [address]    Report Status 09/29/2020 FINAL  Final  Body fluid culture     Status:  None   Collection Time: 09/29/20 12:27 PM   Specimen: PATH Cytology Peritoneal fluid  Result Value Ref Range Status   Specimen Description PERITONEAL FLUID  Final   Special Requests NONE  Final   Gram Stain   Final    WBC PRESENT,BOTH PMN AND MONONUCLEAR NO ORGANISMS SEEN CYTOSPIN SMEAR    Culture   Final    NO GROWTH 3 DAYS Performed at [facility], [address] [place], [place] [address]    Report Status 10/03/2020 FINAL  Final  Culture, blood (Routine X 2) w Reflex to ID Panel     Status: Abnormal   Collection Time: 10/04/20 12:50 PM   Specimen: BLOOD  Result Value Ref Range Status   Specimen Description BLOOD RIGHT ANTECUBITAL  Final   Special Requests   Final    BOTTLES DRAWN AEROBIC AND ANAEROBIC Blood Culture adequate volume   Culture  Setup Time   Final    GRAM POSITIVE COCCI IN CLUSTERS AEROBIC BOTTLE ONLY CRITICAL RESULT CALLED TO, READ BACK BY AND VERIFIED WITH: A. [place] Silos RN 16:20 10/05/20 (wilsonm) Performed at [facility], [place] [address], [place], [place] [address]    Culture STAPHYLOCOCCUS EPIDERMIDIS (A)  Final   Report Status 10/07/2020 FINAL  Final   Organism ID, Bacteria STAPHYLOCOCCUS EPIDERMIDIS  Final      Susceptibility   Staphylococcus epidermidis - MIC*    CIPROFLOXACIN >=8 RESISTANT Resistant     ERYTHROMYCIN >=8 RESISTANT Resistant     GENTAMICIN >=16 RESISTANT Resistant     OXACILLIN >=4 RESISTANT Resistant     TETRACYCLINE 2 SENSITIVE Sensitive     VANCOMYCIN 2 SENSITIVE Sensitive     TRIMETH/SULFA 160 RESISTANT Resistant     CLINDAMYCIN >=8 RESISTANT Resistant     RIFAMPIN <=0.5 SENSITIVE Sensitive     Inducible Clindamycin NEGATIVE Sensitive     * STAPHYLOCOCCUS EPIDERMIDIS  Blood Culture ID Panel (Reflexed)     Status: Abnormal   Collection Time: 10/04/20 12:50 PM  Result Value Ref Range Status   Enterococcus faecalis NOT DETECTED NOT DETECTED Final   Enterococcus Faecium NOT DETECTED NOT DETECTED Final   Listeria  monocytogenes NOT DETECTED NOT DETECTED Final   Staphylococcus species DETECTED (A) NOT DETECTED Final    Comment: CRITICAL RESULT CALLED TO, READ BACK BY AND VERIFIED WITH: A. [place]ville Silos RN 16:20 10/05/20 (wilsonm)    Staphylococcus aureus (BCID) NOT DETECTED NOT DETECTED Final   Staphylococcus epidermidis DETECTED (A) NOT DETECTED Final    Comment: Methicillin (oxacillin) resistant coagulase negative staphylococcus. Possible blood culture contaminant (unless isolated from more than one blood culture draw or clinical  case suggests pathogenicity). No antibiotic treatment is indicated for blood  culture contaminants. CRITICAL RESULT CALLED TO, READ BACK BY AND VERIFIED WITH: A. Grandville Silos RN 16:20 10/05/20 (wilsonm)    Staphylococcus lugdunensis NOT DETECTED NOT DETECTED Final   Streptococcus species NOT DETECTED NOT DETECTED Final   Streptococcus agalactiae NOT DETECTED NOT DETECTED Final   Streptococcus pneumoniae NOT DETECTED NOT DETECTED Final   Streptococcus pyogenes NOT DETECTED NOT DETECTED Final   A.calcoaceticus-baumannii NOT DETECTED NOT DETECTED Final   Bacteroides fragilis NOT DETECTED NOT DETECTED Final   Enterobacterales NOT DETECTED NOT DETECTED Final   Enterobacter cloacae complex NOT DETECTED NOT DETECTED Final   Escherichia coli NOT DETECTED NOT DETECTED Final   Klebsiella aerogenes NOT DETECTED NOT DETECTED Final   Klebsiella oxytoca NOT DETECTED NOT DETECTED Final   Klebsiella pneumoniae NOT DETECTED NOT DETECTED Final   Proteus species NOT DETECTED NOT DETECTED Final   Salmonella species NOT DETECTED NOT DETECTED Final   Serratia marcescens NOT DETECTED NOT DETECTED Final   Haemophilus influenzae NOT DETECTED NOT DETECTED Final   Neisseria meningitidis NOT DETECTED NOT DETECTED Final   Pseudomonas aeruginosa NOT DETECTED NOT DETECTED Final   Stenotrophomonas maltophilia NOT DETECTED NOT DETECTED Final   Candida albicans NOT DETECTED NOT DETECTED Final   Candida auris  NOT DETECTED NOT DETECTED Final   Candida glabrata NOT DETECTED NOT DETECTED Final   Candida krusei NOT DETECTED NOT DETECTED Final   Candida parapsilosis NOT DETECTED NOT DETECTED Final   Candida tropicalis NOT DETECTED NOT DETECTED Final   Cryptococcus neoformans/gattii NOT DETECTED NOT DETECTED Final   Methicillin resistance mecA/C DETECTED (A) NOT DETECTED Final    Comment: CRITICAL RESULT CALLED TO, READ BACK BY AND VERIFIED WITH: A. Grandville Silos RN 16:20 10/05/20 (wilsonm) Performed at [facility], [place] [address] [place], [place] [address]   Culture, blood (Routine X 2) w Reflex to ID Panel     Status: Abnormal   Collection Time: 10/04/20 12:51 PM   Specimen: BLOOD RIGHT HAND  Result Value Ref Range Status   Specimen Description BLOOD RIGHT HAND  Final   Special Requests   Final    BOTTLES DRAWN AEROBIC AND ANAEROBIC Blood Culture adequate volume   Culture  Setup Time   Final    GRAM POSITIVE COCCI AEROBIC BOTTLE ONLY CRITICAL VALUE NOTED.  VALUE IS CONSISTENT WITH PREVIOUSLY REPORTED AND CALLED VALUE.    Culture (A)  Final    STAPHYLOCOCCUS EPIDERMIDIS SUSCEPTIBILITIES PERFORMED ON PREVIOUS CULTURE WITHIN THE LAST 5 DAYS. Performed at [facility], [place] [address] [place], [place] [address]    Report Status 10/07/2020 FINAL  Final  C Difficile Quick Screen (NO PCR Reflex)     Status: None   Collection Time: 10/04/20  5:19 PM   Specimen: STOOL  Result Value Ref Range Status   C Diff antigen NEGATIVE NEGATIVE Final   C Diff toxin NEGATIVE NEGATIVE Final   C Diff interpretation No C. difficile detected.  Final    Comment: Performed at [facility], [place] [address] [place], [place] [address]  Culture, respiratory (non-expectorated)     Status: None   Collection Time: 10/05/20 10:14 PM   Specimen: Tracheal Aspirate; Respiratory  Result Value Ref Range Status   Specimen Description TRACHEAL ASPIRATE  Final   Special Requests NONE  Final   Gram Stain   Final     MODERATE WBC PRESENT, PREDOMINANTLY PMN MODERATE GRAM NEGATIVE RODS FEW YEAST RARE Lonell Grandchild  POSITIVE COCCI IN CHAINS Performed at [facility], [place] [address] [place], [place] [address]    Culture MODERATE KLEBSIELLA PNEUMONIAE  Final   Report Status 10/08/2020 FINAL  Final   Organism ID, Bacteria KLEBSIELLA PNEUMONIAE  Final      Susceptibility   Klebsiella pneumoniae - MIC*    AMPICILLIN RESISTANT Resistant     CEFAZOLIN <=4 SENSITIVE Sensitive     CEFEPIME <=0.12 SENSITIVE Sensitive     CEFTAZIDIME <=1 SENSITIVE Sensitive     CEFTRIAXONE <=0.25 SENSITIVE Sensitive     CIPROFLOXACIN >=4 RESISTANT Resistant     GENTAMICIN <=1 SENSITIVE Sensitive     IMIPENEM <=0.25 SENSITIVE Sensitive     TRIMETH/SULFA <=20 SENSITIVE Sensitive     AMPICILLIN/SULBACTAM 8 SENSITIVE Sensitive     PIP/TAZO 16 SENSITIVE Sensitive     * MODERATE KLEBSIELLA PNEUMONIAE  Culture, blood (routine x 2)     Status: None   Collection Time: 10/08/20  4:17 PM   Specimen: BLOOD RIGHT WRIST  Result Value Ref Range Status   Specimen Description BLOOD RIGHT WRIST  Final   Special Requests   Final    BOTTLES DRAWN AEROBIC ONLY Blood Culture results may not be optimal due to an excessive volume of blood received in culture bottles   Culture   Final    NO GROWTH 5 DAYS Performed at [facility], [place] [address], [place], [place] [address]    Report Status 10/13/2020 FINAL  Final  Culture, blood (routine x 2)     Status: None   Collection Time: 10/08/20  4:17 PM   Specimen: BLOOD RIGHT HAND  Result Value Ref Range Status   Specimen Description BLOOD RIGHT HAND  Final   Special Requests   Final    BOTTLES DRAWN AEROBIC ONLY Blood Culture results may not be optimal due to an excessive volume of blood received in culture bottles   Culture   Final    NO GROWTH 5 DAYS Performed at [facility], [place] [address], [place], [place] [address]    Report Status 10/13/2020 FINAL  Final  Culture,  respiratory (non-expectorated)     Status: None   Collection Time: 10/14/20  1:41 PM   Specimen: Tracheal Aspirate; Respiratory  Result Value Ref Range Status   Specimen Description TRACHEAL ASPIRATE  Final   Special Requests NONE  Final   Gram Stain   Final    RARE WBC PRESENT, PREDOMINANTLY PMN RARE GRAM NEGATIVE RODS    Culture   Final    RARE Normal respiratory flora-no Staph aureus or Pseudomonas seen Performed at [facility], [address] [place], [place] [address]    Report Status 10/17/2020 FINAL  Final  Culture, blood (routine x 2)     Status: Abnormal (Preliminary result)   Collection Time: 10/27/20  9:24 PM   Specimen: BLOOD  Result Value Ref Range Status   Specimen Description BLOOD RIGHT HAND  Final   Special Requests   Final    BOTTLES DRAWN AEROBIC ONLY Blood Culture adequate volume   Culture  Setup Time   Final    Organism ID to follow YEAST AEROBIC BOTTLE ONLY CRITICAL RESULT CALLED TO, READ BACK BY AND VERIFIED WITH: RN D [place] [id number] AT 1400 BY CM    Culture (A)  Final    CANDIDA GLABRATA Sent to [place] for further susceptibility testing. Performed at [facility], [place] [address] [place], [place] [address]  Report Status PENDING  Incomplete  Culture, blood (routine x 2)     Status: None   Collection Time: 10/27/20  9:24 PM   Specimen: BLOOD  Result Value Ref Range Status   Specimen Description BLOOD RIGHT ARM  Final   Special Requests   Final    BOTTLES DRAWN AEROBIC ONLY Blood Culture adequate volume   Culture   Final    NO GROWTH 5 DAYS Performed at [facility], [address], [place], [place] [address]    Report Status 11/01/2020 FINAL  Final  Blood Culture ID Panel (Reflexed)     Status: Abnormal   Collection Time: 10/27/20  9:24 PM  Result Value Ref Range Status   Enterococcus faecalis NOT DETECTED NOT DETECTED Final   Enterococcus Faecium NOT DETECTED NOT DETECTED Final   Listeria monocytogenes NOT DETECTED  NOT DETECTED Final   Staphylococcus species NOT DETECTED NOT DETECTED Final   Staphylococcus aureus (BCID) NOT DETECTED NOT DETECTED Final   Staphylococcus epidermidis NOT DETECTED NOT DETECTED Final   Staphylococcus lugdunensis NOT DETECTED NOT DETECTED Final   Streptococcus species NOT DETECTED NOT DETECTED Final   Streptococcus agalactiae NOT DETECTED NOT DETECTED Final   Streptococcus pneumoniae NOT DETECTED NOT DETECTED Final   Streptococcus pyogenes NOT DETECTED NOT DETECTED Final   A.calcoaceticus-baumannii NOT DETECTED NOT DETECTED Final   Bacteroides fragilis NOT DETECTED NOT DETECTED Final   Enterobacterales NOT DETECTED NOT DETECTED Final   Enterobacter cloacae complex NOT DETECTED NOT DETECTED Final   Escherichia coli NOT DETECTED NOT DETECTED Final   Klebsiella aerogenes NOT DETECTED NOT DETECTED Final   Klebsiella oxytoca NOT DETECTED NOT DETECTED Final   Klebsiella pneumoniae NOT DETECTED NOT DETECTED Final   Proteus species NOT DETECTED NOT DETECTED Final   Salmonella species NOT DETECTED NOT DETECTED Final   Serratia marcescens NOT DETECTED NOT DETECTED Final   Haemophilus influenzae NOT DETECTED NOT DETECTED Final   Neisseria meningitidis NOT DETECTED NOT DETECTED Final   Pseudomonas aeruginosa NOT DETECTED NOT DETECTED Final   Stenotrophomonas maltophilia NOT DETECTED NOT DETECTED Final   Candida albicans NOT DETECTED NOT DETECTED Final   Candida auris NOT DETECTED NOT DETECTED Final   Candida glabrata DETECTED (A) NOT DETECTED Final    Comment: CRITICAL RESULT CALLED TO, READ BACK BY AND VERIFIED WITH: PHARMD M MITCHELL [id number] AT 1350 BY CM    Candida krusei NOT DETECTED NOT DETECTED Final   Candida parapsilosis NOT DETECTED NOT DETECTED Final   Candida tropicalis NOT DETECTED NOT DETECTED Final   Cryptococcus neoformans/gattii NOT DETECTED NOT DETECTED Final    Comment: Performed at [facility], [address] [place], [place] [address]  Gram stain      Status: None   Collection Time: 10/31/20  3:16 PM   Specimen: Abdomen; Peritoneal Fluid  Result Value Ref Range Status   Specimen Description FLUID PERITONEAL ABDOMEN  Final   Special Requests NONE  Final   Gram Stain   Final    RARE WBC PRESENT,BOTH PMN AND MONONUCLEAR NO ORGANISMS SEEN Performed at [facility], [place] [address] [place],  [address]    Report Status 10/31/2020 FINAL  Final  Culture, body fluid-bottle     Status: None   Collection Time: 10/31/20  3:16 PM   Specimen: Fluid  Result Value Ref Range Status   Specimen Description FLUID PERITONEAL ABDOMEN  Final   Special Requests BOTTLES DRAWN AEROBIC AND ANAEROBIC  Final   Culture  Final    NO GROWTH 5 DAYS Performed at [facility], [place] [address] [place], [place] [address]    Report Status 11/05/2020 FINAL  Final  Culture, blood (routine x 2)     Status: None   Collection Time: 11/03/20  3:32 PM   Specimen: BLOOD  Result Value Ref Range Status   Specimen Description BLOOD LEFT ANTECUBITAL  Final   Special Requests   Final    BOTTLES DRAWN AEROBIC AND ANAEROBIC Blood Culture results may not be optimal due to an inadequate volume of blood received in culture bottles   Culture   Final    NO GROWTH 5 DAYS Performed at [facility], [place] [address], [place], [place] [address]    Report Status 11/08/2020 FINAL  Final  Culture, blood (routine x 2)     Status: None   Collection Time: 11/03/20  3:45 PM   Specimen: BLOOD  Result Value Ref Range Status   Specimen Description BLOOD LEFT ANTECUBITAL  Final   Special Requests   Final    BOTTLES DRAWN AEROBIC ONLY Blood Culture adequate volume   Culture   Final    NO GROWTH 5 DAYS Performed at [facility], [place] [address] [place], [place] [address]    Report Status 11/08/2020 FINAL  Final  Culture, respiratory (non-expectorated)     Status: None   Collection Time: 11/03/20  4:44 PM   Specimen: Tracheal Aspirate; Respiratory  Result  Value Ref Range Status   Specimen Description TRACHEAL ASPIRATE  Final   Special Requests NONE  Final   Gram Stain   Final    FEW WBC PRESENT, PREDOMINANTLY PMN ABUNDANT GRAM NEGATIVE RODS MODERATE GRAM POSITIVE RODS FEW GRAM POSITIVE COCCI FEW YEAST Performed at [facility], [address] [place], [place] [address]    Culture ABUNDANT KLEBSIELLA PNEUMONIAE  Final   Report Status 11/05/2020 FINAL  Final   Organism ID, Bacteria KLEBSIELLA PNEUMONIAE  Final      Susceptibility   Klebsiella pneumoniae - MIC*    AMPICILLIN RESISTANT Resistant     CEFAZOLIN <=4 SENSITIVE Sensitive     CEFEPIME <=0.12 SENSITIVE Sensitive     CEFTAZIDIME <=1 SENSITIVE Sensitive     CEFTRIAXONE <=0.25 SENSITIVE Sensitive     CIPROFLOXACIN 1 SENSITIVE Sensitive     GENTAMICIN <=1 SENSITIVE Sensitive     IMIPENEM <=0.25 SENSITIVE Sensitive     TRIMETH/SULFA <=20 SENSITIVE Sensitive     AMPICILLIN/SULBACTAM 4 SENSITIVE Sensitive     PIP/TAZO <=4 SENSITIVE Sensitive     * ABUNDANT KLEBSIELLA PNEUMONIAE    Coagulation Studies: No results for input(s): LABPROT, INR in the last 72 hours.  Urinalysis: No results for input(s): COLORURINE, LABSPEC, PHURINE, GLUCOSEU, HGBUR, BILIRUBINUR, KETONESUR, PROTEINUR, UROBILINOGEN, NITRITE, LEUKOCYTESUR in the last 72 hours.  Invalid input(s): APPERANCEUR    Imaging: No results found.   Medications:     iohexol, lidocaine  Assessment/ Plan:  54 y.o. 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   1.  ESRD on HD.  We  will plan for hemodialysis treatment today at the bedside.  Continue  dialysis on MWF.  2.  Hypotension.  Patient maintained on midodrine and albumin as needed.  3.  Anemia of chronic kidney disease.   Lab Results  Component Value Date   HGB 7.5 (L) 11/13/2020  Continue to monitor hemoglobin periodically.  4.  Secondary hyperparathyroidism.  Serum phosphorus 5.1 and acceptable.  5.  Acute respiratory failure.  Patient successfully decannulated and weaned from the ventilator.  Breathing comfortably currently.    LOS: 0 Jeremy Yu 11/22/20217:55 AM

## 2020-11-14 LAB — EXPECTORATED SPUTUM ASSESSMENT W GRAM STAIN, RFLX TO RESP C

## 2020-11-15 LAB — RENAL FUNCTION PANEL
Albumin: 2.3 g/dL — ABNORMAL LOW (ref 3.5–5.0)
Anion gap: 15 (ref 5–15)
BUN: 56 mg/dL — ABNORMAL HIGH (ref 6–20)
CO2: 24 mmol/L (ref 22–32)
Calcium: 11.5 mg/dL — ABNORMAL HIGH (ref 8.9–10.3)
Chloride: 94 mmol/L — ABNORMAL LOW (ref 98–111)
Creatinine, Ser: 3.47 mg/dL — ABNORMAL HIGH (ref 0.61–1.24)
GFR, Estimated: 20 mL/min — ABNORMAL LOW (ref >60)
Glucose, Bld: 105 mg/dL — ABNORMAL HIGH (ref 70–99)
Phosphorus: 5.5 mg/dL — ABNORMAL HIGH (ref 2.5–4.6)
Potassium: 3.8 mmol/L (ref 3.5–5.1)
Sodium: 133 mmol/L — ABNORMAL LOW (ref 135–145)

## 2020-11-15 LAB — CBC
HCT: 25.6 % — ABNORMAL LOW (ref 39.0–52.0)
Hemoglobin: 7.6 g/dL — ABNORMAL LOW (ref 13.0–17.0)
MCH: 27.8 pg (ref 26.0–34.0)
MCHC: 29.7 g/dL — ABNORMAL LOW (ref 30.0–36.0)
MCV: 93.8 fL (ref 80.0–100.0)
Platelets: 780 10*3/uL — ABNORMAL HIGH (ref 150–400)
RBC: 2.73 MIL/uL — ABNORMAL LOW (ref 4.22–5.81)
RDW: 20.8 % — ABNORMAL HIGH (ref 11.5–15.5)
WBC: 22.5 10*3/uL — ABNORMAL HIGH (ref 4.0–10.5)
nRBC: 0.4 % — ABNORMAL HIGH (ref 0.0–0.2)

## 2020-11-15 LAB — MAGNESIUM: Magnesium: 2.2 mg/dL (ref 1.7–2.4)

## 2020-11-15 NOTE — Progress Notes (Signed)
[facility]  ROUNDING NOTE   Subjective:  Patient seen and evaluated during hemodialysis treatment this AM. Sitting up in a chair. A bit more awake and alert today.  Objective:  Vital signs in last 24 hours:  Temperature 96.6 pulse 101 respirations 30 blood pressure 118/68   Physical Exam: General:  Chronically ill-appearing  Head:  Normocephalic, atraumatic. Moist oral mucosal membranes  Eyes:  Anicteric  Neck:  Supple  Lungs:   Scattered rhonchi, normal effort  Heart:  S1S2 no rubs  Abdomen:   Soft, nontender, bowel sounds present, PEG in place, colostomy  Extremities:  Right AKA  Neurologic:  Awake, alert, will follow commands this a.m.  Skin:  No acute rash  Access:  Left upper extremity AV fistula    Basic Metabolic Panel: Recent Labs  Lab 11/10/20 0358 11/13/20 0621 11/15/20 0446  NA 132* 135  --   K 3.8 4.0  --   CL 95* 95*  --   CO2 24 25  --   GLUCOSE 145* 109*  --   BUN 59* 81*  --   CREATININE 3.04* 3.95*  --   CALCIUM 11.7*  11.5* 11.6*  --   MG 2.0 2.3 2.2  PHOS 3.4 5.1*  --     Liver Function Tests: Recent Labs  Lab 11/10/20 0358 11/13/20 0621  ALBUMIN 2.1* 2.0*   No results for input(s): LIPASE, AMYLASE in the last 168 hours. No results for input(s): AMMONIA in the last 168 hours.  CBC: Recent Labs  Lab 11/10/20 0358 11/13/20 0621 11/15/20 0446  WBC 19.1* 22.7* 22.5*  HGB 7.5* 7.5* 7.6*  HCT 25.3* 25.2* 25.6*  MCV 95.5 93.3 93.8  PLT 825* 785* 780*    Cardiac Enzymes: No results for input(s): CKTOTAL, CKMB, CKMBINDEX, TROPONINI in the last 168 hours.  BNP: Invalid input(s): POCBNP  CBG: No results for input(s): GLUCAP in the last 168 hours.  Microbiology: Results for orders placed or performed during the hospital encounter of 09/22/20  Culture, respiratory (non-expectorated)     Status: None   Collection Time: 09/23/20 11:25 AM   Specimen: Tracheal Aspirate; Respiratory  Result Value Ref Range Status    Specimen Description TRACHEAL ASPIRATE  Final   Special Requests NONE  Final   Gram Stain   Final    RARE WBC PRESENT,BOTH PMN AND MONONUCLEAR NO ORGANISMS SEEN Performed at [facility], [address], [place], [place] [address]    Culture FEW KLEBSIELLA PNEUMONIAE  Final   Report Status 09/25/2020 FINAL  Final   Organism ID, Bacteria KLEBSIELLA PNEUMONIAE  Final      Susceptibility   Klebsiella pneumoniae - MIC*    AMPICILLIN >=32 RESISTANT Resistant     CEFAZOLIN <=4 SENSITIVE Sensitive     CEFEPIME 0.25 SENSITIVE Sensitive     CEFTAZIDIME <=1 SENSITIVE Sensitive     CEFTRIAXONE 1 SENSITIVE Sensitive     CIPROFLOXACIN <=0.25 SENSITIVE Sensitive     GENTAMICIN <=1 SENSITIVE Sensitive     IMIPENEM <=0.25 SENSITIVE Sensitive     TRIMETH/SULFA <=20 SENSITIVE Sensitive     AMPICILLIN/SULBACTAM 16 INTERMEDIATE Intermediate     PIP/TAZO 64 INTERMEDIATE Intermediate     * FEW KLEBSIELLA PNEUMONIAE  Culture, blood (routine x 2)     Status: None   Collection Time: 09/23/20  4:26 PM   Specimen: BLOOD  Result Value Ref Range Status   Specimen Description BLOOD BLOOD RIGHT HAND  Final   Special Requests  Final    AEROBIC BOTTLE ONLY Blood Culture results may not be optimal due to an inadequate volume of blood received in culture bottles   Culture   Final    NO GROWTH 5 DAYS Performed at [facility], [place] [address], [place], [place] [address]    Report Status 09/28/2020 FINAL  Final  Culture, blood (single)     Status: None   Collection Time: 09/24/20  6:00 AM   Specimen: BLOOD RIGHT HAND  Result Value Ref Range Status   Specimen Description BLOOD RIGHT HAND  Final   Special Requests   Final    BOTTLES DRAWN AEROBIC ONLY Blood Culture results may not be optimal due to an inadequate volume of blood received in culture bottles   Culture   Final    NO GROWTH 5 DAYS Performed at [facility], [place] [address], [place], [place] [address]    Report Status 09/29/2020  FINAL  Final  Body fluid culture     Status: None   Collection Time: 09/29/20 12:27 PM   Specimen: PATH Cytology Peritoneal fluid  Result Value Ref Range Status   Specimen Description PERITONEAL FLUID  Final   Special Requests NONE  Final   Gram Stain   Final    WBC PRESENT,BOTH PMN AND MONONUCLEAR NO ORGANISMS SEEN CYTOSPIN SMEAR    Culture   Final    NO GROWTH 3 DAYS Performed at [facility], [address] [place], [place] [address]    Report Status 10/03/2020 FINAL  Final  Culture, blood (Routine X 2) w Reflex to ID Panel     Status: Abnormal   Collection Time: 10/04/20 12:50 PM   Specimen: BLOOD  Result Value Ref Range Status   Specimen Description BLOOD RIGHT ANTECUBITAL  Final   Special Requests   Final    BOTTLES DRAWN AEROBIC AND ANAEROBIC Blood Culture adequate volume   Culture  Setup Time   Final    GRAM POSITIVE COCCI IN CLUSTERS AEROBIC BOTTLE ONLY CRITICAL RESULT CALLED TO, READ BACK BY AND VERIFIED WITH: A. Grandville Silos RN 16:20 10/05/20 ([email or web address]) Performed at [facility], [place] [address], [place], [place] [address]    Culture STAPHYLOCOCCUS EPIDERMIDIS (A)  Final   Report Status 10/07/2020 FINAL  Final   Organism ID, Bacteria STAPHYLOCOCCUS EPIDERMIDIS  Final      Susceptibility   Staphylococcus epidermidis - MIC*    CIPROFLOXACIN >=8 RESISTANT Resistant     ERYTHROMYCIN >=8 RESISTANT Resistant     GENTAMICIN >=16 RESISTANT Resistant     OXACILLIN >=4 RESISTANT Resistant     TETRACYCLINE 2 SENSITIVE Sensitive     VANCOMYCIN 2 SENSITIVE Sensitive     TRIMETH/SULFA 160 RESISTANT Resistant     CLINDAMYCIN >=8 RESISTANT Resistant     RIFAMPIN <=0.5 SENSITIVE Sensitive     Inducible Clindamycin NEGATIVE Sensitive     * STAPHYLOCOCCUS EPIDERMIDIS  Blood Culture ID Panel (Reflexed)     Status: Abnormal   Collection Time: 10/04/20 12:50 PM  Result Value Ref Range Status   Enterococcus faecalis NOT DETECTED NOT DETECTED Final   Enterococcus Faecium  NOT DETECTED NOT DETECTED Final   Listeria monocytogenes NOT DETECTED NOT DETECTED Final   Staphylococcus species DETECTED (A) NOT DETECTED Final    Comment: CRITICAL RESULT CALLED TO, READ BACK BY AND VERIFIED WITH: A. Grandville Silos RN 16:20 10/05/20 (wilsonm)    Staphylococcus aureus (BCID) NOT DETECTED NOT DETECTED Final   Staphylococcus epidermidis  DETECTED (A) NOT DETECTED Final    Comment: Methicillin (oxacillin) resistant coagulase negative staphylococcus. Possible blood culture contaminant (unless isolated from more than one blood culture draw or clinical case suggests pathogenicity). No antibiotic treatment is indicated for blood  culture contaminants. CRITICAL RESULT CALLED TO, READ BACK BY AND VERIFIED WITH: A. Janee Morn RN 16:20 10/05/20 (wilsonm)    Staphylococcus lugdunensis NOT DETECTED NOT DETECTED Final   Streptococcus species NOT DETECTED NOT DETECTED Final   Streptococcus agalactiae NOT DETECTED NOT DETECTED Final   Streptococcus pneumoniae NOT DETECTED NOT DETECTED Final   Streptococcus pyogenes NOT DETECTED NOT DETECTED Final   A.calcoaceticus-baumannii NOT DETECTED NOT DETECTED Final   Bacteroides fragilis NOT DETECTED NOT DETECTED Final   Enterobacterales NOT DETECTED NOT DETECTED Final   Enterobacter cloacae complex NOT DETECTED NOT DETECTED Final   Escherichia coli NOT DETECTED NOT DETECTED Final   Klebsiella aerogenes NOT DETECTED NOT DETECTED Final   Klebsiella oxytoca NOT DETECTED NOT DETECTED Final   Klebsiella pneumoniae NOT DETECTED NOT DETECTED Final   Proteus species NOT DETECTED NOT DETECTED Final   Salmonella species NOT DETECTED NOT DETECTED Final   Serratia marcescens NOT DETECTED NOT DETECTED Final   Haemophilus influenzae NOT DETECTED NOT DETECTED Final   Neisseria meningitidis NOT DETECTED NOT DETECTED Final   Pseudomonas aeruginosa NOT DETECTED NOT DETECTED Final   Stenotrophomonas maltophilia NOT DETECTED NOT DETECTED Final   Candida albicans NOT  DETECTED NOT DETECTED Final   Candida auris NOT DETECTED NOT DETECTED Final   Candida glabrata NOT DETECTED NOT DETECTED Final   Candida krusei NOT DETECTED NOT DETECTED Final   Candida parapsilosis NOT DETECTED NOT DETECTED Final   Candida tropicalis NOT DETECTED NOT DETECTED Final   Cryptococcus neoformans/gattii NOT DETECTED NOT DETECTED Final   Methicillin resistance mecA/C DETECTED (A) NOT DETECTED Final    Comment: CRITICAL RESULT CALLED TO, READ BACK BY AND VERIFIED WITH: A. Janee Morn RN 16:20 10/05/20 (wilsonm) Performed at [facility], [address] [place], [place] [address]   Culture, blood (Routine X 2) w Reflex to ID Panel     Status: Abnormal   Collection Time: 10/04/20 12:51 PM   Specimen: BLOOD RIGHT HAND  Result Value Ref Range Status   Specimen Description BLOOD RIGHT HAND  Final   Special Requests   Final    BOTTLES DRAWN AEROBIC AND ANAEROBIC Blood Culture adequate volume   Culture  Setup Time   Final    GRAM POSITIVE COCCI AEROBIC BOTTLE ONLY CRITICAL VALUE NOTED.  VALUE IS CONSISTENT WITH PREVIOUSLY REPORTED AND CALLED VALUE.    Culture (A)  Final    STAPHYLOCOCCUS EPIDERMIDIS SUSCEPTIBILITIES PERFORMED ON PREVIOUS CULTURE WITHIN THE LAST 5 DAYS. Performed at [facility], [address], [place], [place] [address]    Report Status 10/07/2020 FINAL  Final  C Difficile Quick Screen (NO PCR Reflex)     Status: None   Collection Time: 10/04/20  5:19 PM   Specimen: STOOL  Result Value Ref Range Status   C Diff antigen NEGATIVE NEGATIVE Final   C Diff toxin NEGATIVE NEGATIVE Final   C Diff interpretation No C. difficile detected.  Final    Comment: Performed at [facility], [address] [place], [place] [address]  Culture, respiratory (non-expectorated)     Status: None   Collection Time: 10/05/20 10:14 PM   Specimen: Tracheal Aspirate; Respiratory  Result Value Ref Range Status   Specimen Description TRACHEAL ASPIRATE  Final  Special Requests NONE  Final   Gram Stain   Final    MODERATE WBC PRESENT, PREDOMINANTLY PMN MODERATE GRAM NEGATIVE RODS FEW YEAST RARE GRAM POSITIVE COCCI IN CHAINS Performed at [facility], [place] [address] [place], [place] [address]    Culture MODERATE KLEBSIELLA PNEUMONIAE  Final   Report Status 10/08/2020 FINAL  Final   Organism ID, Bacteria KLEBSIELLA PNEUMONIAE  Final      Susceptibility   Klebsiella pneumoniae - MIC*    AMPICILLIN RESISTANT Resistant     CEFAZOLIN <=4 SENSITIVE Sensitive     CEFEPIME <=0.12 SENSITIVE Sensitive     CEFTAZIDIME <=1 SENSITIVE Sensitive     CEFTRIAXONE <=0.25 SENSITIVE Sensitive     CIPROFLOXACIN >=4 RESISTANT Resistant     GENTAMICIN <=1 SENSITIVE Sensitive     IMIPENEM <=0.25 SENSITIVE Sensitive     TRIMETH/SULFA <=20 SENSITIVE Sensitive     AMPICILLIN/SULBACTAM 8 SENSITIVE Sensitive     PIP/TAZO 16 SENSITIVE Sensitive     * MODERATE KLEBSIELLA PNEUMONIAE  Culture, blood (routine x 2)     Status: None   Collection Time: 10/08/20  4:17 PM   Specimen: BLOOD RIGHT WRIST  Result Value Ref Range Status   Specimen Description BLOOD RIGHT WRIST  Final   Special Requests   Final    BOTTLES DRAWN AEROBIC ONLY Blood Culture results may not be optimal due to an excessive volume of blood received in culture bottles   Culture   Final    NO GROWTH 5 DAYS Performed at [facility], [place] [address], [place], [place] [address]    Report Status 10/13/2020 FINAL  Final  Culture, blood (routine x 2)     Status: None   Collection Time: 10/08/20  4:17 PM   Specimen: BLOOD RIGHT HAND  Result Value Ref Range Status   Specimen Description BLOOD RIGHT HAND  Final   Special Requests   Final    BOTTLES DRAWN AEROBIC ONLY Blood Culture results may not be optimal due to an excessive volume of blood received in culture bottles   Culture   Final    NO GROWTH 5 DAYS Performed at [facility], [place] [address] [place], [place] [address]    Report  Status 10/13/2020 FINAL  Final  Culture, respiratory (non-expectorated)     Status: None   Collection Time: 10/14/20  1:41 PM   Specimen: Tracheal Aspirate; Respiratory  Result Value Ref Range Status   Specimen Description TRACHEAL ASPIRATE  Final   Special Requests NONE  Final   Gram Stain   Final    RARE WBC PRESENT, PREDOMINANTLY PMN RARE GRAM NEGATIVE RODS    Culture   Final    RARE Normal respiratory flora-no Staph aureus or Pseudomonas seen Performed at [facility], [address], [place],  [address]    Report Status 10/17/2020 FINAL  Final  Culture, blood (routine x 2)     Status: Abnormal   Collection Time: 10/27/20  9:24 PM   Specimen: BLOOD  Result Value Ref Range Status   Specimen Description BLOOD RIGHT HAND  Final   Special Requests   Final    BOTTLES DRAWN AEROBIC ONLY Blood Culture adequate volume   Culture  Setup Time   Final    Organism ID to follow YEAST AEROBIC BOTTLE ONLY CRITICAL RESULT CALLED TO, READ BACK BY AND VERIFIED WITH: RN D [place] [id number] AT 1400 BY CM    Culture (A)  Final    CANDIDA GLABRATA SEE SEPARATE REPORT FOR SUSCEPTIBILITY RESULTS Performed at [employer] Performed at [facility], [address] [place], [place] [address]    Report Status 11/13/2020 FINAL  Final  Culture, blood (routine x 2)     Status: None   Collection Time: 10/27/20  9:24 PM   Specimen: BLOOD  Result Value Ref Range Status   Specimen Description BLOOD RIGHT ARM  Final   Special Requests   Final    BOTTLES DRAWN AEROBIC ONLY Blood Culture adequate volume   Culture   Final    NO GROWTH 5 DAYS Performed at [facility], [address] [place], [place] [address]    Report Status 11/01/2020 FINAL  Final  Blood Culture ID Panel (Reflexed)     Status: Abnormal   Collection Time: 10/27/20  9:24 PM  Result Value Ref Range Status   Enterococcus faecalis NOT DETECTED NOT DETECTED Final   Enterococcus Faecium NOT DETECTED NOT  DETECTED Final   Listeria monocytogenes NOT DETECTED NOT DETECTED Final   Staphylococcus species NOT DETECTED NOT DETECTED Final   Staphylococcus aureus (BCID) NOT DETECTED NOT DETECTED Final   Staphylococcus epidermidis NOT DETECTED NOT DETECTED Final   Staphylococcus lugdunensis NOT DETECTED NOT DETECTED Final   Streptococcus species NOT DETECTED NOT DETECTED Final   Streptococcus agalactiae NOT DETECTED NOT DETECTED Final   Streptococcus pneumoniae NOT DETECTED NOT DETECTED Final   Streptococcus pyogenes NOT DETECTED NOT DETECTED Final   A.calcoaceticus-baumannii NOT DETECTED NOT DETECTED Final   Bacteroides fragilis NOT DETECTED NOT DETECTED Final   Enterobacterales NOT DETECTED NOT DETECTED Final   Enterobacter cloacae complex NOT DETECTED NOT DETECTED Final   Escherichia coli NOT DETECTED NOT DETECTED Final   Klebsiella aerogenes NOT DETECTED NOT DETECTED Final   Klebsiella oxytoca NOT DETECTED NOT DETECTED Final   Klebsiella pneumoniae NOT DETECTED NOT DETECTED Final   Proteus species NOT DETECTED NOT DETECTED Final   Salmonella species NOT DETECTED NOT DETECTED Final   Serratia marcescens NOT DETECTED NOT DETECTED Final   Haemophilus influenzae NOT DETECTED NOT DETECTED Final   Neisseria meningitidis NOT DETECTED NOT DETECTED Final   Pseudomonas aeruginosa NOT DETECTED NOT DETECTED Final   Stenotrophomonas maltophilia NOT DETECTED NOT DETECTED Final   Candida albicans NOT DETECTED NOT DETECTED Final   Candida auris NOT DETECTED NOT DETECTED Final   Candida glabrata DETECTED (A) NOT DETECTED Final    Comment: CRITICAL RESULT CALLED TO, READ BACK BY AND VERIFIED WITH: PHARMD M MITCHELL [id number] AT 1350 BY CM    Candida krusei NOT DETECTED NOT DETECTED Final   Candida parapsilosis NOT DETECTED NOT DETECTED Final   Candida tropicalis NOT DETECTED NOT DETECTED Final   Cryptococcus neoformans/gattii NOT DETECTED NOT DETECTED Final    Comment: Performed at [facility],  [address] [place], [place] [address]  Gram stain     Status: None   Collection Time: 10/31/20  3:16 PM   Specimen: Abdomen; Peritoneal Fluid  Result Value Ref Range Status   Specimen Description FLUID PERITONEAL ABDOMEN  Final   Special Requests NONE  Final   Gram Stain   Final    RARE WBC PRESENT,BOTH PMN AND MONONUCLEAR NO ORGANISMS SEEN Performed at [facility], [address] [place], [place] [address]    Report Status 10/31/2020 FINAL  Final  Culture, body fluid-bottle     Status: None   Collection Time: 10/31/20  3:16 PM  Specimen: Fluid  Result Value Ref Range Status   Specimen Description FLUID PERITONEAL ABDOMEN  Final   Special Requests BOTTLES DRAWN AEROBIC AND ANAEROBIC  Final   Culture   Final    NO GROWTH 5 DAYS Performed at [facility], [address], [place], [place] [address]    Report Status 11/05/2020 FINAL  Final  Culture, blood (routine x 2)     Status: None   Collection Time: 11/03/20  3:32 PM   Specimen: BLOOD  Result Value Ref Range Status   Specimen Description BLOOD LEFT ANTECUBITAL  Final   Special Requests   Final    BOTTLES DRAWN AEROBIC AND ANAEROBIC Blood Culture results may not be optimal due to an inadequate volume of blood received in culture bottles   Culture   Final    NO GROWTH 5 DAYS Performed at [facility], [place] [address] [place], [place] [address]    Report Status 11/08/2020 FINAL  Final  Culture, blood (routine x 2)     Status: None   Collection Time: 11/03/20  3:45 PM   Specimen: BLOOD  Result Value Ref Range Status   Specimen Description BLOOD LEFT ANTECUBITAL  Final   Special Requests   Final    BOTTLES DRAWN AEROBIC ONLY Blood Culture adequate volume   Culture   Final    NO GROWTH 5 DAYS Performed at [facility], [place] [address], [place], [place] [address]    Report Status 11/08/2020 FINAL  Final  Culture, respiratory (non-expectorated)     Status: None   Collection Time: 11/03/20  4:44 PM    Specimen: Tracheal Aspirate; Respiratory  Result Value Ref Range Status   Specimen Description TRACHEAL ASPIRATE  Final   Special Requests NONE  Final   Gram Stain   Final    FEW WBC PRESENT, PREDOMINANTLY PMN ABUNDANT GRAM NEGATIVE RODS MODERATE GRAM POSITIVE RODS FEW GRAM POSITIVE COCCI FEW YEAST Performed at [facility], [address], [place], [place] [address]    Culture ABUNDANT KLEBSIELLA PNEUMONIAE  Final   Report Status 11/05/2020 FINAL  Final   Organism ID, Bacteria KLEBSIELLA PNEUMONIAE  Final      Susceptibility   Klebsiella pneumoniae - MIC*    AMPICILLIN RESISTANT Resistant     CEFAZOLIN <=4 SENSITIVE Sensitive     CEFEPIME <=0.12 SENSITIVE Sensitive     CEFTAZIDIME <=1 SENSITIVE Sensitive     CEFTRIAXONE <=0.25 SENSITIVE Sensitive     CIPROFLOXACIN 1 SENSITIVE Sensitive     GENTAMICIN <=1 SENSITIVE Sensitive     IMIPENEM <=0.25 SENSITIVE Sensitive     TRIMETH/SULFA <=20 SENSITIVE Sensitive     AMPICILLIN/SULBACTAM 4 SENSITIVE Sensitive     PIP/TAZO <=4 SENSITIVE Sensitive     * ABUNDANT KLEBSIELLA PNEUMONIAE  Expectorated sputum assessment w rflx to resp cult     Status: None   Collection Time: 11/13/20  4:06 PM   Specimen: Expectorated Sputum  Result Value Ref Range Status   Specimen Description EXPECTORATED SPUTUM  Final   Special Requests NONE  Final   Sputum evaluation   Final    THIS SPECIMEN IS ACCEPTABLE FOR SPUTUM CULTURE Performed at [facility], [address], [place], [place] [address]    Report Status 11/14/2020 FINAL  Final  Culture, respiratory     Status: None (Preliminary result)   Collection Time: 11/13/20  4:06 PM  Result Value Ref Range Status   Specimen Description EXPECTORATED  SPUTUM  Final   Special Requests NONE Reflexed from [id number]  Final   Gram Stain   Final    NO WBC SEEN FEW GRAM NEGATIVE RODS Performed at [facility], [place] [address] [place], [place] [address]    Culture PENDING  Incomplete    Report Status PENDING  Incomplete    Coagulation Studies: No results for input(s): LABPROT, INR in the last 72 hours.  Urinalysis: No results for input(s): COLORURINE, LABSPEC, PHURINE, GLUCOSEU, HGBUR, BILIRUBINUR, KETONESUR, PROTEINUR, UROBILINOGEN, NITRITE, LEUKOCYTESUR in the last 72 hours.  Invalid input(s): APPERANCEUR    Imaging: No results found.   Medications:     iohexol, lidocaine  Assessment/ Plan:  54 y.o. 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   1.  ESRD on HD.  Patient seen and evaluated during hemodialysis treatment today.  Tolerating well.  Next dialysis treatment on Friday.  2.  Hypotension.  Blood pressure has stabilized at 118/68.  Patient has midodrine and albumin available for blood pressure support during dialysis treatments.  3.  Anemia of chronic kidney disease.   Lab Results  Component Value Date   HGB 7.6 (L) 11/15/2020  No immediate need for blood transfusion but continue to monitor CBC.  4.  Secondary hyperparathyroidism.  Awaiting repeat phosphorus today.  5.  Acute respiratory failure.  Patient continues to do well from a respiratory perspective.  Weaning from the ventilator and decannulation.    LOS: 0 Jeremy Yu 11/24/20217:52 AM

## 2020-11-17 LAB — CBC
HCT: 26.9 % — ABNORMAL LOW (ref 39.0–52.0)
Hemoglobin: 7.8 g/dL — ABNORMAL LOW (ref 13.0–17.0)
MCH: 27.7 pg (ref 26.0–34.0)
MCHC: 29 g/dL — ABNORMAL LOW (ref 30.0–36.0)
MCV: 95.4 fL (ref 80.0–100.0)
Platelets: 926 10*3/uL (ref 150–400)
RBC: 2.82 MIL/uL — ABNORMAL LOW (ref 4.22–5.81)
RDW: 20.5 % — ABNORMAL HIGH (ref 11.5–15.5)
WBC: 23.5 10*3/uL — ABNORMAL HIGH (ref 4.0–10.5)
nRBC: 0.4 % — ABNORMAL HIGH (ref 0.0–0.2)

## 2020-11-17 LAB — RENAL FUNCTION PANEL
Albumin: 2.5 g/dL — ABNORMAL LOW (ref 3.5–5.0)
Anion gap: 13 (ref 5–15)
BUN: 61 mg/dL — ABNORMAL HIGH (ref 6–20)
CO2: 26 mmol/L (ref 22–32)
Calcium: 12.2 mg/dL — ABNORMAL HIGH (ref 8.9–10.3)
Chloride: 91 mmol/L — ABNORMAL LOW (ref 98–111)
Creatinine, Ser: 3.5 mg/dL — ABNORMAL HIGH (ref 0.61–1.24)
GFR, Estimated: 20 mL/min — ABNORMAL LOW (ref >60)
Glucose, Bld: 113 mg/dL — ABNORMAL HIGH (ref 70–99)
Phosphorus: 4.8 mg/dL — ABNORMAL HIGH (ref 2.5–4.6)
Potassium: 4.3 mmol/L (ref 3.5–5.1)
Sodium: 130 mmol/L — ABNORMAL LOW (ref 135–145)

## 2020-11-17 LAB — CULTURE, RESPIRATORY W GRAM STAIN: Gram Stain: NONE SEEN

## 2020-11-17 LAB — MAGNESIUM: Magnesium: 2.4 mg/dL (ref 1.7–2.4)

## 2020-11-17 NOTE — Progress Notes (Signed)
[facility]  ROUNDING NOTE   Subjective:  Patient seen and evaluated during dialysis treatment today. States he is having a bit of nausea today. Also having generalized pain.  Objective:  Vital signs in last 24 hours:  Temperature 97.3 pulse 90 respirations 20 blood pressure 136/81   Physical Exam: General:  Chronically ill-appearing  Head:  Normocephalic, atraumatic. Moist oral mucosal membranes  Eyes:  Anicteric  Neck:  Supple  Lungs:   Scattered rhonchi, normal effort  Heart:  S1S2 no rubs  Abdomen:   Soft, nontender, bowel sounds present, PEG in place, colostomy  Extremities:  Right AKA  Neurologic:  Awake, alert, will follow commands this a.m.  Skin:  No acute rash  Access:  Left upper extremity AV fistula    Basic Metabolic Panel: Recent Labs  Lab 11/13/20 0621 11/15/20 0446 11/15/20 0737 11/17/20 0453  NA 135  --  133* 130*  K 4.0  --  3.8 4.3  CL 95*  --  94* 91*  CO2 25  --  24 26  GLUCOSE 109*  --  105* 113*  BUN 81*  --  56* 61*  CREATININE 3.95*  --  3.47* 3.50*  CALCIUM 11.6*  --  11.5* 12.2*  MG 2.3 2.2  --  2.4  PHOS 5.1*  --  5.5* 4.8*    Liver Function Tests: Recent Labs  Lab 11/13/20 0621 11/15/20 0737 11/17/20 0453  ALBUMIN 2.0* 2.3* 2.5*   No results for input(s): LIPASE, AMYLASE in the last 168 hours. No results for input(s): AMMONIA in the last 168 hours.  CBC: Recent Labs  Lab 11/13/20 0621 11/15/20 0446 11/17/20 0453  WBC 22.7* 22.5* 23.5*  HGB 7.5* 7.6* 7.8*  HCT 25.2* 25.6* 26.9*  MCV 93.3 93.8 95.4  PLT 785* 780* 926*    Cardiac Enzymes: No results for input(s): CKTOTAL, CKMB, CKMBINDEX, TROPONINI in the last 168 hours.  BNP: Invalid input(s): POCBNP  CBG: No results for input(s): GLUCAP in the last 168 hours.  Microbiology: Results for orders placed or performed during the hospital encounter of 09/22/20  Culture, respiratory (non-expectorated)     Status: None   Collection Time: 09/23/20 11:25  AM   Specimen: Tracheal Aspirate; Respiratory  Result Value Ref Range Status   Specimen Description TRACHEAL ASPIRATE  Final   Special Requests NONE  Final   Gram Stain   Final    RARE WBC PRESENT,BOTH PMN AND MONONUCLEAR NO ORGANISMS SEEN Performed at [facility], [address], [place], [place] [address]    Culture FEW KLEBSIELLA PNEUMONIAE  Final   Report Status 09/25/2020 FINAL  Final   Organism ID, Bacteria KLEBSIELLA PNEUMONIAE  Final      Susceptibility   Klebsiella pneumoniae - MIC*    AMPICILLIN >=32 RESISTANT Resistant     CEFAZOLIN <=4 SENSITIVE Sensitive     CEFEPIME 0.25 SENSITIVE Sensitive     CEFTAZIDIME <=1 SENSITIVE Sensitive     CEFTRIAXONE 1 SENSITIVE Sensitive     CIPROFLOXACIN <=0.25 SENSITIVE Sensitive     GENTAMICIN <=1 SENSITIVE Sensitive     IMIPENEM <=0.25 SENSITIVE Sensitive     TRIMETH/SULFA <=20 SENSITIVE Sensitive     AMPICILLIN/SULBACTAM 16 INTERMEDIATE Intermediate     PIP/TAZO 64 INTERMEDIATE Intermediate     * FEW KLEBSIELLA PNEUMONIAE  Culture, blood (routine x 2)     Status: None   Collection Time: 09/23/20  4:26 PM   Specimen: BLOOD  Result Value Ref Range Status  Specimen Description BLOOD BLOOD RIGHT HAND  Final   Special Requests   Final    AEROBIC BOTTLE ONLY Blood Culture results may not be optimal due to an inadequate volume of blood received in culture bottles   Culture   Final    NO GROWTH 5 DAYS Performed at [facility], [address], [place], [place] [address]    Report Status 09/28/2020 FINAL  Final  Culture, blood (single)     Status: None   Collection Time: 09/24/20  6:00 AM   Specimen: BLOOD RIGHT HAND  Result Value Ref Range Status   Specimen Description BLOOD RIGHT HAND  Final   Special Requests   Final    BOTTLES DRAWN AEROBIC ONLY Blood Culture results may not be optimal due to an inadequate volume of blood received in culture bottles   Culture   Final    NO GROWTH 5 DAYS Performed at [facility], [address] [place], [place] [address]    Report Status 09/29/2020 FINAL  Final  Body fluid culture     Status: None   Collection Time: 09/29/20 12:27 PM   Specimen: PATH Cytology Peritoneal fluid  Result Value Ref Range Status   Specimen Description PERITONEAL FLUID  Final   Special Requests NONE  Final   Gram Stain   Final    WBC PRESENT,BOTH PMN AND MONONUCLEAR NO ORGANISMS SEEN CYTOSPIN SMEAR    Culture   Final    NO GROWTH 3 DAYS Performed at [facility], [address], [place], [place] [address]    Report Status 10/03/2020 FINAL  Final  Culture, blood (Routine X 2) w Reflex to ID Panel     Status: Abnormal   Collection Time: 10/04/20 12:50 PM   Specimen: BLOOD  Result Value Ref Range Status   Specimen Description BLOOD RIGHT ANTECUBITAL  Final   Special Requests   Final    BOTTLES DRAWN AEROBIC AND ANAEROBIC Blood Culture adequate volume   Culture  Setup Time   Final    GRAM POSITIVE COCCI IN CLUSTERS AEROBIC BOTTLE ONLY CRITICAL RESULT CALLED TO, READ BACK BY AND VERIFIED WITH: A. Janee Morn RN 16:20 10/05/20 ([email or web address]) Performed at [facility], [address], [place], [place] [address]    Culture STAPHYLOCOCCUS EPIDERMIDIS (A)  Final   Report Status 10/07/2020 FINAL  Final   Organism ID, Bacteria STAPHYLOCOCCUS EPIDERMIDIS  Final      Susceptibility   Staphylococcus epidermidis - MIC*    CIPROFLOXACIN >=8 RESISTANT Resistant     ERYTHROMYCIN >=8 RESISTANT Resistant     GENTAMICIN >=16 RESISTANT Resistant     OXACILLIN >=4 RESISTANT Resistant     TETRACYCLINE 2 SENSITIVE Sensitive     VANCOMYCIN 2 SENSITIVE Sensitive     TRIMETH/SULFA 160 RESISTANT Resistant     CLINDAMYCIN >=8 RESISTANT Resistant     RIFAMPIN <=0.5 SENSITIVE Sensitive     Inducible Clindamycin NEGATIVE Sensitive     * STAPHYLOCOCCUS EPIDERMIDIS  Blood Culture ID Panel (Reflexed)     Status: Abnormal   Collection Time: 10/04/20 12:50 PM  Result Value Ref Range Status    Enterococcus faecalis NOT DETECTED NOT DETECTED Final   Enterococcus Faecium NOT DETECTED NOT DETECTED Final   Listeria monocytogenes NOT DETECTED NOT DETECTED Final   Staphylococcus species DETECTED (A) NOT DETECTED Final    Comment: CRITICAL RESULT CALLED TO, READ BACK BY AND VERIFIED WITH: A. Janee Morn RN 16:20 10/05/20 ([email or web address])  Staphylococcus aureus (BCID) NOT DETECTED NOT DETECTED Final   Staphylococcus epidermidis DETECTED (A) NOT DETECTED Final    Comment: Methicillin (oxacillin) resistant coagulase negative staphylococcus. Possible blood culture contaminant (unless isolated from more than one blood culture draw or clinical case suggests pathogenicity). No antibiotic treatment is indicated for blood  culture contaminants. CRITICAL RESULT CALLED TO, READ BACK BY AND VERIFIED WITH: A. Grandville Silos RN 16:20 10/05/20 ([email or web address])    Staphylococcus lugdunensis NOT DETECTED NOT DETECTED Final   Streptococcus species NOT DETECTED NOT DETECTED Final   Streptococcus agalactiae NOT DETECTED NOT DETECTED Final   Streptococcus pneumoniae NOT DETECTED NOT DETECTED Final   Streptococcus pyogenes NOT DETECTED NOT DETECTED Final   A.calcoaceticus-baumannii NOT DETECTED NOT DETECTED Final   Bacteroides fragilis NOT DETECTED NOT DETECTED Final   Enterobacterales NOT DETECTED NOT DETECTED Final   Enterobacter cloacae complex NOT DETECTED NOT DETECTED Final   Escherichia coli NOT DETECTED NOT DETECTED Final   Klebsiella aerogenes NOT DETECTED NOT DETECTED Final   Klebsiella oxytoca NOT DETECTED NOT DETECTED Final   Klebsiella pneumoniae NOT DETECTED NOT DETECTED Final   Proteus species NOT DETECTED NOT DETECTED Final   Salmonella species NOT DETECTED NOT DETECTED Final   Serratia marcescens NOT DETECTED NOT DETECTED Final   Haemophilus influenzae NOT DETECTED NOT DETECTED Final   Neisseria meningitidis NOT DETECTED NOT DETECTED Final   Pseudomonas aeruginosa NOT DETECTED NOT DETECTED Final    Stenotrophomonas maltophilia NOT DETECTED NOT DETECTED Final   Candida albicans NOT DETECTED NOT DETECTED Final   Candida auris NOT DETECTED NOT DETECTED Final   Candida glabrata NOT DETECTED NOT DETECTED Final   Candida krusei NOT DETECTED NOT DETECTED Final   Candida parapsilosis NOT DETECTED NOT DETECTED Final   Candida tropicalis NOT DETECTED NOT DETECTED Final   Cryptococcus neoformans/gattii NOT DETECTED NOT DETECTED Final   Methicillin resistance mecA/C DETECTED (A) NOT DETECTED Final    Comment: CRITICAL RESULT CALLED TO, READ BACK BY AND VERIFIED WITH: A. Grandville Silos RN 16:20 10/05/20 ([email or web address]) Performed at [facility], [place] [address] [place], [place] [address]   Culture, blood (Routine X 2) w Reflex to ID Panel     Status: Abnormal   Collection Time: 10/04/20 12:51 PM   Specimen: BLOOD RIGHT HAND  Result Value Ref Range Status   Specimen Description BLOOD RIGHT HAND  Final   Special Requests   Final    BOTTLES DRAWN AEROBIC AND ANAEROBIC Blood Culture adequate volume   Culture  Setup Time   Final    GRAM POSITIVE COCCI AEROBIC BOTTLE ONLY CRITICAL VALUE NOTED.  VALUE IS CONSISTENT WITH PREVIOUSLY REPORTED AND CALLED VALUE.    Culture (A)  Final    STAPHYLOCOCCUS EPIDERMIDIS SUSCEPTIBILITIES PERFORMED ON PREVIOUS CULTURE WITHIN THE LAST 5 DAYS. Performed at [facility], [place] [address] [place], [place] [address]    Report Status 10/07/2020 FINAL  Final  C Difficile Quick Screen (NO PCR Reflex)     Status: None   Collection Time: 10/04/20  5:19 PM   Specimen: STOOL  Result Value Ref Range Status   C Diff antigen NEGATIVE NEGATIVE Final   C Diff toxin NEGATIVE NEGATIVE Final   C Diff interpretation No C. difficile detected.  Final    Comment: Performed at [facility], [place] [address] [place], [place] [address]  Culture, respiratory (non-expectorated)     Status: None   Collection Time: 10/05/20 10:14 PM   Specimen: Tracheal Aspirate; Respiratory   Result  Value Ref Range Status   Specimen Description TRACHEAL ASPIRATE  Final   Special Requests NONE  Final   Gram Stain   Final    MODERATE WBC PRESENT, PREDOMINANTLY PMN MODERATE GRAM NEGATIVE RODS FEW YEAST RARE GRAM POSITIVE COCCI IN CHAINS Performed at [facility], [address], [place], [place] [address]    Culture MODERATE KLEBSIELLA PNEUMONIAE  Final   Report Status 10/08/2020 FINAL  Final   Organism ID, Bacteria KLEBSIELLA PNEUMONIAE  Final      Susceptibility   Klebsiella pneumoniae - MIC*    AMPICILLIN RESISTANT Resistant     CEFAZOLIN <=4 SENSITIVE Sensitive     CEFEPIME <=0.12 SENSITIVE Sensitive     CEFTAZIDIME <=1 SENSITIVE Sensitive     CEFTRIAXONE <=0.25 SENSITIVE Sensitive     CIPROFLOXACIN >=4 RESISTANT Resistant     GENTAMICIN <=1 SENSITIVE Sensitive     IMIPENEM <=0.25 SENSITIVE Sensitive     TRIMETH/SULFA <=20 SENSITIVE Sensitive     AMPICILLIN/SULBACTAM 8 SENSITIVE Sensitive     PIP/TAZO 16 SENSITIVE Sensitive     * MODERATE KLEBSIELLA PNEUMONIAE  Culture, blood (routine x 2)     Status: None   Collection Time: 10/08/20  4:17 PM   Specimen: BLOOD RIGHT WRIST  Result Value Ref Range Status   Specimen Description BLOOD RIGHT WRIST  Final   Special Requests   Final    BOTTLES DRAWN AEROBIC ONLY Blood Culture results may not be optimal due to an excessive volume of blood received in culture bottles   Culture   Final    NO GROWTH 5 DAYS Performed at [facility], [place] [address], [place], [place] [address]    Report Status 10/13/2020 FINAL  Final  Culture, blood (routine x 2)     Status: None   Collection Time: 10/08/20  4:17 PM   Specimen: BLOOD RIGHT HAND  Result Value Ref Range Status   Specimen Description BLOOD RIGHT HAND  Final   Special Requests   Final    BOTTLES DRAWN AEROBIC ONLY Blood Culture results may not be optimal due to an excessive volume of blood received in culture bottles   Culture   Final    NO GROWTH 5  DAYS Performed at [facility], [place] [address], [place], [place] [address]    Report Status 10/13/2020 FINAL  Final  Culture, respiratory (non-expectorated)     Status: None   Collection Time: 10/14/20  1:41 PM   Specimen: Tracheal Aspirate; Respiratory  Result Value Ref Range Status   Specimen Description TRACHEAL ASPIRATE  Final   Special Requests NONE  Final   Gram Stain   Final    RARE WBC PRESENT, PREDOMINANTLY PMN RARE GRAM NEGATIVE RODS    Culture   Final    RARE Normal respiratory flora-no Staph aureus or Pseudomonas seen Performed at [facility], [address], [place], [place] [address]    Report Status 10/17/2020 FINAL  Final  Culture, blood (routine x 2)     Status: Abnormal   Collection Time: 10/27/20  9:24 PM   Specimen: BLOOD  Result Value Ref Range Status   Specimen Description BLOOD RIGHT HAND  Final   Special Requests   Final    BOTTLES DRAWN AEROBIC ONLY Blood Culture adequate volume   Culture  Setup Time   Final    Organism ID to follow YEAST AEROBIC BOTTLE ONLY CRITICAL RESULT CALLED TO, READ BACK BY AND VERIFIED WITH:  RN D [place] [phone] AT 1400 BY CM    Culture (A)  Final    CANDIDA GLABRATA SEE SEPARATE REPORT FOR SUSCEPTIBILITY RESULTS Performed at [employer] Performed at [facility], [address], [place], [place] [address]    Report Status 11/13/2020 FINAL  Final  Culture, blood (routine x 2)     Status: None   Collection Time: 10/27/20  9:24 PM   Specimen: BLOOD  Result Value Ref Range Status   Specimen Description BLOOD RIGHT ARM  Final   Special Requests   Final    BOTTLES DRAWN AEROBIC ONLY Blood Culture adequate volume   Culture   Final    NO GROWTH 5 DAYS Performed at [facility], [address], [place], [place] [address]    Report Status 11/01/2020 FINAL  Final  Blood Culture ID Panel (Reflexed)     Status: Abnormal   Collection Time: 10/27/20  9:24 PM  Result Value Ref Range Status    Enterococcus faecalis NOT DETECTED NOT DETECTED Final   Enterococcus Faecium NOT DETECTED NOT DETECTED Final   Listeria monocytogenes NOT DETECTED NOT DETECTED Final   Staphylococcus species NOT DETECTED NOT DETECTED Final   Staphylococcus aureus (BCID) NOT DETECTED NOT DETECTED Final   Staphylococcus epidermidis NOT DETECTED NOT DETECTED Final   Staphylococcus lugdunensis NOT DETECTED NOT DETECTED Final   Streptococcus species NOT DETECTED NOT DETECTED Final   Streptococcus agalactiae NOT DETECTED NOT DETECTED Final   Streptococcus pneumoniae NOT DETECTED NOT DETECTED Final   Streptococcus pyogenes NOT DETECTED NOT DETECTED Final   A.calcoaceticus-baumannii NOT DETECTED NOT DETECTED Final   Bacteroides fragilis NOT DETECTED NOT DETECTED Final   Enterobacterales NOT DETECTED NOT DETECTED Final   Enterobacter cloacae complex NOT DETECTED NOT DETECTED Final   Escherichia coli NOT DETECTED NOT DETECTED Final   Klebsiella aerogenes NOT DETECTED NOT DETECTED Final   Klebsiella oxytoca NOT DETECTED NOT DETECTED Final   Klebsiella pneumoniae NOT DETECTED NOT DETECTED Final   Proteus species NOT DETECTED NOT DETECTED Final   Salmonella species NOT DETECTED NOT DETECTED Final   Serratia marcescens NOT DETECTED NOT DETECTED Final   Haemophilus influenzae NOT DETECTED NOT DETECTED Final   Neisseria meningitidis NOT DETECTED NOT DETECTED Final   Pseudomonas aeruginosa NOT DETECTED NOT DETECTED Final   Stenotrophomonas maltophilia NOT DETECTED NOT DETECTED Final   Candida albicans NOT DETECTED NOT DETECTED Final   Candida auris NOT DETECTED NOT DETECTED Final   Candida glabrata DETECTED (A) NOT DETECTED Final    Comment: CRITICAL RESULT CALLED TO, READ BACK BY AND VERIFIED WITH: PHARMD M MITCHELL [id number] AT 1350 BY CM    Candida krusei NOT DETECTED NOT DETECTED Final   Candida parapsilosis NOT DETECTED NOT DETECTED Final   Candida tropicalis NOT DETECTED NOT DETECTED Final   Cryptococcus  neoformans/gattii NOT DETECTED NOT DETECTED Final    Comment: Performed at [facility], [address] [place], [place] [address]  Gram stain     Status: None   Collection Time: 10/31/20  3:16 PM   Specimen: Abdomen; Peritoneal Fluid  Result Value Ref Range Status   Specimen Description FLUID PERITONEAL ABDOMEN  Final   Special Requests NONE  Final   Gram Stain   Final    RARE WBC PRESENT,BOTH PMN AND MONONUCLEAR NO ORGANISMS SEEN Performed at [facility], [address] [place], [place] [address]    Report Status 10/31/2020 FINAL  Final  Culture, body fluid-bottle  Status: None   Collection Time: 10/31/20  3:16 PM   Specimen: Fluid  Result Value Ref Range Status   Specimen Description FLUID PERITONEAL ABDOMEN  Final   Special Requests BOTTLES DRAWN AEROBIC AND ANAEROBIC  Final   Culture   Final    NO GROWTH 5 DAYS Performed at [facility], [address], [place], [place] [address]    Report Status 11/05/2020 FINAL  Final  Culture, blood (routine x 2)     Status: None   Collection Time: 11/03/20  3:32 PM   Specimen: BLOOD  Result Value Ref Range Status   Specimen Description BLOOD LEFT ANTECUBITAL  Final   Special Requests   Final    BOTTLES DRAWN AEROBIC AND ANAEROBIC Blood Culture results may not be optimal due to an inadequate volume of blood received in culture bottles   Culture   Final    NO GROWTH 5 DAYS Performed at [facility], [address] [place], [place] [address]    Report Status 11/08/2020 FINAL  Final  Culture, blood (routine x 2)     Status: None   Collection Time: 11/03/20  3:45 PM   Specimen: BLOOD  Result Value Ref Range Status   Specimen Description BLOOD LEFT ANTECUBITAL  Final   Special Requests   Final    BOTTLES DRAWN AEROBIC ONLY Blood Culture adequate volume   Culture   Final    NO GROWTH 5 DAYS Performed at [facility], [address] [place], [place] [address]    Report Status 11/08/2020 FINAL   Final  Culture, respiratory (non-expectorated)     Status: None   Collection Time: 11/03/20  4:44 PM   Specimen: Tracheal Aspirate; Respiratory  Result Value Ref Range Status   Specimen Description TRACHEAL ASPIRATE  Final   Special Requests NONE  Final   Gram Stain   Final    FEW WBC PRESENT, PREDOMINANTLY PMN ABUNDANT GRAM NEGATIVE RODS MODERATE GRAM POSITIVE RODS FEW GRAM POSITIVE COCCI FEW YEAST Performed at [facility], [address], [place], [place] [address]    Culture ABUNDANT KLEBSIELLA PNEUMONIAE  Final   Report Status 11/05/2020 FINAL  Final   Organism ID, Bacteria KLEBSIELLA PNEUMONIAE  Final      Susceptibility   Klebsiella pneumoniae - MIC*    AMPICILLIN RESISTANT Resistant     CEFAZOLIN <=4 SENSITIVE Sensitive     CEFEPIME <=0.12 SENSITIVE Sensitive     CEFTAZIDIME <=1 SENSITIVE Sensitive     CEFTRIAXONE <=0.25 SENSITIVE Sensitive     CIPROFLOXACIN 1 SENSITIVE Sensitive     GENTAMICIN <=1 SENSITIVE Sensitive     IMIPENEM <=0.25 SENSITIVE Sensitive     TRIMETH/SULFA <=20 SENSITIVE Sensitive     AMPICILLIN/SULBACTAM 4 SENSITIVE Sensitive     PIP/TAZO <=4 SENSITIVE Sensitive     * ABUNDANT KLEBSIELLA PNEUMONIAE  Expectorated sputum assessment w rflx to resp cult     Status: None   Collection Time: 11/13/20  4:06 PM   Specimen: Expectorated Sputum  Result Value Ref Range Status   Specimen Description EXPECTORATED SPUTUM  Final   Special Requests NONE  Final   Sputum evaluation   Final    THIS SPECIMEN IS ACCEPTABLE FOR SPUTUM CULTURE Performed at [facility], [address], [place], [place] [address]    Report Status 11/14/2020 FINAL  Final  Culture, respiratory     Status: None   Collection Time: 11/13/20  4:06 PM  Result Value Ref Range Status   Specimen Description EXPECTORATED SPUTUM  Final   Special Requests NONE Reflexed from [id number]  Final   Gram Stain   Final    NO WBC SEEN FEW GRAM NEGATIVE RODS Performed at [facility], [address] [place], [place] [address]    Culture MODERATE STENOTROPHOMONAS MALTOPHILIA  Final   Report Status 11/17/2020 FINAL  Final   Organism ID, Bacteria STENOTROPHOMONAS MALTOPHILIA  Final      Susceptibility   Stenotrophomonas maltophilia - MIC*    LEVOFLOXACIN 2 SENSITIVE Sensitive     TRIMETH/SULFA <=20 SENSITIVE Sensitive     * MODERATE STENOTROPHOMONAS MALTOPHILIA    Coagulation Studies: No results for input(s): LABPROT, INR in the last 72 hours.  Urinalysis: No results for input(s): COLORURINE, LABSPEC, PHURINE, GLUCOSEU, HGBUR, BILIRUBINUR, KETONESUR, PROTEINUR, UROBILINOGEN, NITRITE, LEUKOCYTESUR in the last 72 hours.  Invalid input(s): APPERANCEUR    Imaging: No results found.   Medications:     iohexol, lidocaine  Assessment/ Plan:  54 y.o. 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   1.  ESRD on HD.  Patient seen during dialysis treatment this AM.  Tolerating well.  Decrease ultrafiltration target to 1.5 kg.  2.  Hypotension.  Patient has albumin and midodrine available for blood pressure support.  3.  Anemia of chronic kidney disease.   Lab Results  Component Value Date   HGB 7.8 (L) 11/17/2020  Continue to monitor hgb closely.   4.  Secondary hyperparathyroidism.  Phosphorus currently 4.8 and acceptable.  Continue to monitor.  5.  Acute respiratory failure.  Patient doing well post decannulation.    LOS: 0 Mylani Gentry 11/26/202110:52 AM

## 2020-11-19 LAB — CBC
HCT: 27 % — ABNORMAL LOW (ref 39.0–52.0)
Hemoglobin: 7.9 g/dL — ABNORMAL LOW (ref 13.0–17.0)
MCH: 27.9 pg (ref 26.0–34.0)
MCHC: 29.3 g/dL — ABNORMAL LOW (ref 30.0–36.0)
MCV: 95.4 fL (ref 80.0–100.0)
Platelets: 996 10*3/uL (ref 150–400)
RBC: 2.83 MIL/uL — ABNORMAL LOW (ref 4.22–5.81)
RDW: 20.7 % — ABNORMAL HIGH (ref 11.5–15.5)
WBC: 17.8 10*3/uL — ABNORMAL HIGH (ref 4.0–10.5)
nRBC: 0.5 % — ABNORMAL HIGH (ref 0.0–0.2)

## 2020-11-20 ENCOUNTER — Other Ambulatory Visit (HOSPITAL_COMMUNITY): Payer: Medicare Other

## 2020-11-20 LAB — CBC
HCT: 26.3 % — ABNORMAL LOW (ref 39.0–52.0)
Hemoglobin: 7.7 g/dL — ABNORMAL LOW (ref 13.0–17.0)
MCH: 27.9 pg (ref 26.0–34.0)
MCHC: 29.3 g/dL — ABNORMAL LOW (ref 30.0–36.0)
MCV: 95.3 fL (ref 80.0–100.0)
Platelets: 931 10*3/uL (ref 150–400)
RBC: 2.76 MIL/uL — ABNORMAL LOW (ref 4.22–5.81)
RDW: 20.9 % — ABNORMAL HIGH (ref 11.5–15.5)
WBC: 15.6 10*3/uL — ABNORMAL HIGH (ref 4.0–10.5)
nRBC: 0.3 % — ABNORMAL HIGH (ref 0.0–0.2)

## 2020-11-20 LAB — RENAL FUNCTION PANEL
Albumin: 2.4 g/dL — ABNORMAL LOW (ref 3.5–5.0)
Anion gap: 17 — ABNORMAL HIGH (ref 5–15)
BUN: 81 mg/dL — ABNORMAL HIGH (ref 6–20)
CO2: 25 mmol/L (ref 22–32)
Calcium: 12.2 mg/dL — ABNORMAL HIGH (ref 8.9–10.3)
Chloride: 91 mmol/L — ABNORMAL LOW (ref 98–111)
Creatinine, Ser: 4.39 mg/dL — ABNORMAL HIGH (ref 0.61–1.24)
GFR, Estimated: 15 mL/min — ABNORMAL LOW (ref >60)
Glucose, Bld: 108 mg/dL — ABNORMAL HIGH (ref 70–99)
Phosphorus: 6.2 mg/dL — ABNORMAL HIGH (ref 2.5–4.6)
Potassium: 3.6 mmol/L (ref 3.5–5.1)
Sodium: 133 mmol/L — ABNORMAL LOW (ref 135–145)

## 2020-11-20 LAB — MAGNESIUM: Magnesium: 2.5 mg/dL — ABNORMAL HIGH (ref 1.7–2.4)

## 2020-11-20 NOTE — Progress Notes (Signed)
[facility]  ROUNDING NOTE   Subjective:  Patient seen and evaluated during dialysis treatment. Blood flow rate 400. Dialyzing in bed today.  Objective:  Vital signs in last 24 hours:  Temperature 97 pulse 89 respirations 26 blood pressure 123/70   Physical Exam: General:  Chronically ill-appearing  Head:  Normocephalic, atraumatic. Moist oral mucosal membranes  Eyes:  Anicteric  Neck:  Supple  Lungs:   Scattered rhonchi, normal effort  Heart:  S1S2 no rubs  Abdomen:   Soft, nontender, bowel sounds present, PEG in place, colostomy  Extremities:  Right AKA, no left lower extremity edema  Neurologic:  Awake, alert, follows commands  Skin:  No acute rash  Access:  Left upper extremity AV fistula    Basic Metabolic Panel: Recent Labs  Lab 11/15/20 0446 11/15/20 0737 11/17/20 0453 11/20/20 0405 11/20/20 0607  NA  --  133* 130*  --  133*  K  --  3.8 4.3  --  3.6  CL  --  94* 91*  --  91*  CO2  --  24 26  --  25  GLUCOSE  --  105* 113*  --  108*  BUN  --  56* 61*  --  81*  CREATININE  --  3.47* 3.50*  --  4.39*  CALCIUM  --  11.5* 12.2*  --  12.2*  MG 2.2  --  2.4 2.5*  --   PHOS  --  5.5* 4.8*  --  6.2*    Liver Function Tests: Recent Labs  Lab 11/15/20 0737 11/17/20 0453 11/20/20 0607  ALBUMIN 2.3* 2.5* 2.4*   No results for input(s): LIPASE, AMYLASE in the last 168 hours. No results for input(s): AMMONIA in the last 168 hours.  CBC: Recent Labs  Lab 11/15/20 0446 11/17/20 0453 11/19/20 0342 11/20/20 0405  WBC 22.5* 23.5* 17.8* 15.6*  HGB 7.6* 7.8* 7.9* 7.7*  HCT 25.6* 26.9* 27.0* 26.3*  MCV 93.8 95.4 95.4 95.3  PLT 780* 926* 996* 931*    Cardiac Enzymes: No results for input(s): CKTOTAL, CKMB, CKMBINDEX, TROPONINI in the last 168 hours.  BNP: Invalid input(s): POCBNP  CBG: No results for input(s): GLUCAP in the last 168 hours.  Microbiology: Results for orders placed or performed during the hospital encounter of 09/22/20   Culture, respiratory (non-expectorated)     Status: None   Collection Time: 09/23/20 11:25 AM   Specimen: Tracheal Aspirate; Respiratory  Result Value Ref Range Status   Specimen Description TRACHEAL ASPIRATE  Final   Special Requests NONE  Final   Gram Stain   Final    RARE WBC PRESENT,BOTH PMN AND MONONUCLEAR NO ORGANISMS SEEN Performed at [facility], [address] [place], [place] [address]    Culture FEW KLEBSIELLA PNEUMONIAE  Final   Report Status 09/25/2020 FINAL  Final   Organism ID, Bacteria KLEBSIELLA PNEUMONIAE  Final      Susceptibility   Klebsiella pneumoniae - MIC*    AMPICILLIN >=32 RESISTANT Resistant     CEFAZOLIN <=4 SENSITIVE Sensitive     CEFEPIME 0.25 SENSITIVE Sensitive     CEFTAZIDIME <=1 SENSITIVE Sensitive     CEFTRIAXONE 1 SENSITIVE Sensitive     CIPROFLOXACIN <=0.25 SENSITIVE Sensitive     GENTAMICIN <=1 SENSITIVE Sensitive     IMIPENEM <=0.25 SENSITIVE Sensitive     TRIMETH/SULFA <=20 SENSITIVE Sensitive     AMPICILLIN/SULBACTAM 16 INTERMEDIATE Intermediate     PIP/TAZO 64 INTERMEDIATE Intermediate     *  FEW KLEBSIELLA PNEUMONIAE  Culture, blood (routine x 2)     Status: None   Collection Time: 09/23/20  4:26 PM   Specimen: BLOOD  Result Value Ref Range Status   Specimen Description BLOOD BLOOD RIGHT HAND  Final   Special Requests   Final    AEROBIC BOTTLE ONLY Blood Culture results may not be optimal due to an inadequate volume of blood received in culture bottles   Culture   Final    NO GROWTH 5 DAYS Performed at [facility], [place] [address] [place], [place] [address]    Report Status 09/28/2020 FINAL  Final  Culture, blood (single)     Status: None   Collection Time: 09/24/20  6:00 AM   Specimen: BLOOD RIGHT HAND  Result Value Ref Range Status   Specimen Description BLOOD RIGHT HAND  Final   Special Requests   Final    BOTTLES DRAWN AEROBIC ONLY Blood Culture results may not be optimal due to an inadequate volume of blood  received in culture bottles   Culture   Final    NO GROWTH 5 DAYS Performed at [facility], [place] [address], [place], [place] [address]    Report Status 09/29/2020 FINAL  Final  Body fluid culture     Status: None   Collection Time: 09/29/20 12:27 PM   Specimen: PATH Cytology Peritoneal fluid  Result Value Ref Range Status   Specimen Description PERITONEAL FLUID  Final   Special Requests NONE  Final   Gram Stain   Final    WBC PRESENT,BOTH PMN AND MONONUCLEAR NO ORGANISMS SEEN CYTOSPIN SMEAR    Culture   Final    NO GROWTH 3 DAYS Performed at [facility], [address], [place], [place] [address]    Report Status 10/03/2020 FINAL  Final  Culture, blood (Routine X 2) w Reflex to ID Panel     Status: Abnormal   Collection Time: 10/04/20 12:50 PM   Specimen: BLOOD  Result Value Ref Range Status   Specimen Description BLOOD RIGHT ANTECUBITAL  Final   Special Requests   Final    BOTTLES DRAWN AEROBIC AND ANAEROBIC Blood Culture adequate volume   Culture  Setup Time   Final    GRAM POSITIVE COCCI IN CLUSTERS AEROBIC BOTTLE ONLY CRITICAL RESULT CALLED TO, READ BACK BY AND VERIFIED WITH: A. Grandville Silos RN 16:20 10/05/20 ([email or web address]) Performed at [facility], [place] [address], [place], [place] [address]    Culture STAPHYLOCOCCUS EPIDERMIDIS (A)  Final   Report Status 10/07/2020 FINAL  Final   Organism ID, Bacteria STAPHYLOCOCCUS EPIDERMIDIS  Final      Susceptibility   Staphylococcus epidermidis - MIC*    CIPROFLOXACIN >=8 RESISTANT Resistant     ERYTHROMYCIN >=8 RESISTANT Resistant     GENTAMICIN >=16 RESISTANT Resistant     OXACILLIN >=4 RESISTANT Resistant     TETRACYCLINE 2 SENSITIVE Sensitive     VANCOMYCIN 2 SENSITIVE Sensitive     TRIMETH/SULFA 160 RESISTANT Resistant     CLINDAMYCIN >=8 RESISTANT Resistant     RIFAMPIN <=0.5 SENSITIVE Sensitive     Inducible Clindamycin NEGATIVE Sensitive     * STAPHYLOCOCCUS EPIDERMIDIS  Blood Culture ID Panel  (Reflexed)     Status: Abnormal   Collection Time: 10/04/20 12:50 PM  Result Value Ref Range Status   Enterococcus faecalis NOT DETECTED NOT DETECTED Final   Enterococcus Faecium NOT DETECTED NOT DETECTED Final   Listeria monocytogenes  NOT DETECTED NOT DETECTED Final   Staphylococcus species DETECTED (A) NOT DETECTED Final    Comment: CRITICAL RESULT CALLED TO, READ BACK BY AND VERIFIED WITH: A. Grandville Silos RN 16:20 10/05/20 ([email or web address])    Staphylococcus aureus (BCID) NOT DETECTED NOT DETECTED Final   Staphylococcus epidermidis DETECTED (A) NOT DETECTED Final    Comment: Methicillin (oxacillin) resistant coagulase negative staphylococcus. Possible blood culture contaminant (unless isolated from more than one blood culture draw or clinical case suggests pathogenicity). No antibiotic treatment is indicated for blood  culture contaminants. CRITICAL RESULT CALLED TO, READ BACK BY AND VERIFIED WITH: A. Grandville Silos RN 16:20 10/05/20 (wilsonm)    Staphylococcus lugdunensis NOT DETECTED NOT DETECTED Final   Streptococcus species NOT DETECTED NOT DETECTED Final   Streptococcus agalactiae NOT DETECTED NOT DETECTED Final   Streptococcus pneumoniae NOT DETECTED NOT DETECTED Final   Streptococcus pyogenes NOT DETECTED NOT DETECTED Final   A.calcoaceticus-baumannii NOT DETECTED NOT DETECTED Final   Bacteroides fragilis NOT DETECTED NOT DETECTED Final   Enterobacterales NOT DETECTED NOT DETECTED Final   Enterobacter cloacae complex NOT DETECTED NOT DETECTED Final   Escherichia coli NOT DETECTED NOT DETECTED Final   Klebsiella aerogenes NOT DETECTED NOT DETECTED Final   Klebsiella oxytoca NOT DETECTED NOT DETECTED Final   Klebsiella pneumoniae NOT DETECTED NOT DETECTED Final   Proteus species NOT DETECTED NOT DETECTED Final   Salmonella species NOT DETECTED NOT DETECTED Final   Serratia marcescens NOT DETECTED NOT DETECTED Final   Haemophilus influenzae NOT DETECTED NOT DETECTED Final   Neisseria  meningitidis NOT DETECTED NOT DETECTED Final   Pseudomonas aeruginosa NOT DETECTED NOT DETECTED Final   Stenotrophomonas maltophilia NOT DETECTED NOT DETECTED Final   Candida albicans NOT DETECTED NOT DETECTED Final   Candida auris NOT DETECTED NOT DETECTED Final   Candida glabrata NOT DETECTED NOT DETECTED Final   Candida krusei NOT DETECTED NOT DETECTED Final   Candida parapsilosis NOT DETECTED NOT DETECTED Final   Candida tropicalis NOT DETECTED NOT DETECTED Final   Cryptococcus neoformans/gattii NOT DETECTED NOT DETECTED Final   Methicillin resistance mecA/C DETECTED (A) NOT DETECTED Final    Comment: CRITICAL RESULT CALLED TO, READ BACK BY AND VERIFIED WITH: A. Grandville Silos RN 16:20 10/05/20 (wilsonm) Performed at [facility], [place] [address] [place], [place] [address]   Culture, blood (Routine X 2) w Reflex to ID Panel     Status: Abnormal   Collection Time: 10/04/20 12:51 PM   Specimen: BLOOD RIGHT HAND  Result Value Ref Range Status   Specimen Description BLOOD RIGHT HAND  Final   Special Requests   Final    BOTTLES DRAWN AEROBIC AND ANAEROBIC Blood Culture adequate volume   Culture  Setup Time   Final    GRAM POSITIVE COCCI AEROBIC BOTTLE ONLY CRITICAL VALUE NOTED.  VALUE IS CONSISTENT WITH PREVIOUSLY REPORTED AND CALLED VALUE.    Culture (A)  Final    STAPHYLOCOCCUS EPIDERMIDIS SUSCEPTIBILITIES PERFORMED ON PREVIOUS CULTURE WITHIN THE LAST 5 DAYS. Performed at [facility], [place] [address] [place], [place] [address]    Report Status 10/07/2020 FINAL  Final  C Difficile Quick Screen (NO PCR Reflex)     Status: None   Collection Time: 10/04/20  5:19 PM   Specimen: STOOL  Result Value Ref Range Status   C Diff antigen NEGATIVE NEGATIVE Final   C Diff toxin NEGATIVE NEGATIVE Final   C Diff interpretation No C. difficile detected.  Final    Comment: Performed at  [facility], [place] [address], [place], [place] [address]  Culture, respiratory  (non-expectorated)     Status: None   Collection Time: 10/05/20 10:14 PM   Specimen: Tracheal Aspirate; Respiratory  Result Value Ref Range Status   Specimen Description TRACHEAL ASPIRATE  Final   Special Requests NONE  Final   Gram Stain   Final    MODERATE WBC PRESENT, PREDOMINANTLY PMN MODERATE GRAM NEGATIVE RODS FEW YEAST RARE GRAM POSITIVE COCCI IN CHAINS Performed at [facility], [address], [place], [place] [address]    Culture MODERATE KLEBSIELLA PNEUMONIAE  Final   Report Status 10/08/2020 FINAL  Final   Organism ID, Bacteria KLEBSIELLA PNEUMONIAE  Final      Susceptibility   Klebsiella pneumoniae - MIC*    AMPICILLIN RESISTANT Resistant     CEFAZOLIN <=4 SENSITIVE Sensitive     CEFEPIME <=0.12 SENSITIVE Sensitive     CEFTAZIDIME <=1 SENSITIVE Sensitive     CEFTRIAXONE <=0.25 SENSITIVE Sensitive     CIPROFLOXACIN >=4 RESISTANT Resistant     GENTAMICIN <=1 SENSITIVE Sensitive     IMIPENEM <=0.25 SENSITIVE Sensitive     TRIMETH/SULFA <=20 SENSITIVE Sensitive     AMPICILLIN/SULBACTAM 8 SENSITIVE Sensitive     PIP/TAZO 16 SENSITIVE Sensitive     * MODERATE KLEBSIELLA PNEUMONIAE  Culture, blood (routine x 2)     Status: None   Collection Time: 10/08/20  4:17 PM   Specimen: BLOOD RIGHT WRIST  Result Value Ref Range Status   Specimen Description BLOOD RIGHT WRIST  Final   Special Requests   Final    BOTTLES DRAWN AEROBIC ONLY Blood Culture results may not be optimal due to an excessive volume of blood received in culture bottles   Culture   Final    NO GROWTH 5 DAYS Performed at [facility], [place] [address] [place], [place] [address]    Report Status 10/13/2020 FINAL  Final  Culture, blood (routine x 2)     Status: None   Collection Time: 10/08/20  4:17 PM   Specimen: BLOOD RIGHT HAND  Result Value Ref Range Status   Specimen Description BLOOD RIGHT HAND  Final   Special Requests   Final    BOTTLES DRAWN AEROBIC ONLY Blood Culture results may not be  optimal due to an excessive volume of blood received in culture bottles   Culture   Final    NO GROWTH 5 DAYS Performed at [facility], [place] [address] [place], [place] [address]    Report Status 10/13/2020 FINAL  Final  Culture, respiratory (non-expectorated)     Status: None   Collection Time: 10/14/20  1:41 PM   Specimen: Tracheal Aspirate; Respiratory  Result Value Ref Range Status   Specimen Description TRACHEAL ASPIRATE  Final   Special Requests NONE  Final   Gram Stain   Final    RARE WBC PRESENT, PREDOMINANTLY PMN RARE GRAM NEGATIVE RODS    Culture   Final    RARE Normal respiratory flora-no Staph aureus or Pseudomonas seen Performed at [facility], [address] [place], [place] [address]    Report Status 10/17/2020 FINAL  Final  Culture, blood (routine x 2)     Status: Abnormal   Collection Time: 10/27/20  9:24 PM   Specimen: BLOOD  Result Value Ref Range Status   Specimen Description BLOOD RIGHT HAND  Final   Special Requests   Final    BOTTLES  DRAWN AEROBIC ONLY Blood Culture adequate volume   Culture  Setup Time   Final    Organism ID to follow YEAST AEROBIC BOTTLE ONLY CRITICAL RESULT CALLED TO, READ BACK BY AND VERIFIED WITH: RN D [place] [id number] AT 1400 BY CM    Culture (A)  Final    CANDIDA GLABRATA SEE SEPARATE REPORT FOR SUSCEPTIBILITY RESULTS Performed at [employer] Performed at [facility], [address], [place], [place] [address]    Report Status 11/13/2020 FINAL  Final  Culture, blood (routine x 2)     Status: None   Collection Time: 10/27/20  9:24 PM   Specimen: BLOOD  Result Value Ref Range Status   Specimen Description BLOOD RIGHT ARM  Final   Special Requests   Final    BOTTLES DRAWN AEROBIC ONLY Blood Culture adequate volume   Culture   Final    NO GROWTH 5 DAYS Performed at [facility], [address] [place], [place] [address]    Report Status 11/01/2020 FINAL  Final  Blood Culture ID Panel  (Reflexed)     Status: Abnormal   Collection Time: 10/27/20  9:24 PM  Result Value Ref Range Status   Enterococcus faecalis NOT DETECTED NOT DETECTED Final   Enterococcus Faecium NOT DETECTED NOT DETECTED Final   Listeria monocytogenes NOT DETECTED NOT DETECTED Final   Staphylococcus species NOT DETECTED NOT DETECTED Final   Staphylococcus aureus (BCID) NOT DETECTED NOT DETECTED Final   Staphylococcus epidermidis NOT DETECTED NOT DETECTED Final   Staphylococcus lugdunensis NOT DETECTED NOT DETECTED Final   Streptococcus species NOT DETECTED NOT DETECTED Final   Streptococcus agalactiae NOT DETECTED NOT DETECTED Final   Streptococcus pneumoniae NOT DETECTED NOT DETECTED Final   Streptococcus pyogenes NOT DETECTED NOT DETECTED Final   A.calcoaceticus-baumannii NOT DETECTED NOT DETECTED Final   Bacteroides fragilis NOT DETECTED NOT DETECTED Final   Enterobacterales NOT DETECTED NOT DETECTED Final   Enterobacter cloacae complex NOT DETECTED NOT DETECTED Final   Escherichia coli NOT DETECTED NOT DETECTED Final   Klebsiella aerogenes NOT DETECTED NOT DETECTED Final   Klebsiella oxytoca NOT DETECTED NOT DETECTED Final   Klebsiella pneumoniae NOT DETECTED NOT DETECTED Final   Proteus species NOT DETECTED NOT DETECTED Final   Salmonella species NOT DETECTED NOT DETECTED Final   Serratia marcescens NOT DETECTED NOT DETECTED Final   Haemophilus influenzae NOT DETECTED NOT DETECTED Final   Neisseria meningitidis NOT DETECTED NOT DETECTED Final   Pseudomonas aeruginosa NOT DETECTED NOT DETECTED Final   Stenotrophomonas maltophilia NOT DETECTED NOT DETECTED Final   Candida albicans NOT DETECTED NOT DETECTED Final   Candida auris NOT DETECTED NOT DETECTED Final   Candida glabrata DETECTED (A) NOT DETECTED Final    Comment: CRITICAL RESULT CALLED TO, READ BACK BY AND VERIFIED WITH: PHARMD M MITCHELL [id number] AT 1350 BY CM    Candida krusei NOT DETECTED NOT DETECTED Final   Candida parapsilosis NOT  DETECTED NOT DETECTED Final   Candida tropicalis NOT DETECTED NOT DETECTED Final   Cryptococcus neoformans/gattii NOT DETECTED NOT DETECTED Final    Comment: Performed at [facility], [address] [place], [place] [address]  Gram stain     Status: None   Collection Time: 10/31/20  3:16 PM   Specimen: Abdomen; Peritoneal Fluid  Result Value Ref Range Status   Specimen Description FLUID PERITONEAL ABDOMEN  Final   Special Requests NONE  Final   Gram Stain   Final  RARE WBC PRESENT,BOTH PMN AND MONONUCLEAR NO ORGANISMS SEEN Performed at [facility], [place] [address], [place], [place] [address]    Report Status 10/31/2020 FINAL  Final  Culture, body fluid-bottle     Status: None   Collection Time: 10/31/20  3:16 PM   Specimen: Fluid  Result Value Ref Range Status   Specimen Description FLUID PERITONEAL ABDOMEN  Final   Special Requests BOTTLES DRAWN AEROBIC AND ANAEROBIC  Final   Culture   Final    NO GROWTH 5 DAYS Performed at [facility], [place] [address], [place], [place] [address]    Report Status 11/05/2020 FINAL  Final  Culture, blood (routine x 2)     Status: None   Collection Time: 11/03/20  3:32 PM   Specimen: BLOOD  Result Value Ref Range Status   Specimen Description BLOOD LEFT ANTECUBITAL  Final   Special Requests   Final    BOTTLES DRAWN AEROBIC AND ANAEROBIC Blood Culture results may not be optimal due to an inadequate volume of blood received in culture bottles   Culture   Final    NO GROWTH 5 DAYS Performed at [facility], [place] [address], [place], [place] [address]    Report Status 11/08/2020 FINAL  Final  Culture, blood (routine x 2)     Status: None   Collection Time: 11/03/20  3:45 PM   Specimen: BLOOD  Result Value Ref Range Status   Specimen Description BLOOD LEFT ANTECUBITAL  Final   Special Requests   Final    BOTTLES DRAWN AEROBIC ONLY Blood Culture adequate volume   Culture   Final    NO GROWTH 5 DAYS Performed at [facility], [place] [address] [place], [place] [address]    Report Status 11/08/2020 FINAL  Final  Culture, respiratory (non-expectorated)     Status: None   Collection Time: 11/03/20  4:44 PM   Specimen: Tracheal Aspirate; Respiratory  Result Value Ref Range Status   Specimen Description TRACHEAL ASPIRATE  Final   Special Requests NONE  Final   Gram Stain   Final    FEW WBC PRESENT, PREDOMINANTLY PMN ABUNDANT GRAM NEGATIVE RODS MODERATE GRAM POSITIVE RODS FEW GRAM POSITIVE COCCI FEW YEAST Performed at [facility], [address] [place], [place] [address]    Culture ABUNDANT KLEBSIELLA PNEUMONIAE  Final   Report Status 11/05/2020 FINAL  Final   Organism ID, Bacteria KLEBSIELLA PNEUMONIAE  Final      Susceptibility   Klebsiella pneumoniae - MIC*    AMPICILLIN RESISTANT Resistant     CEFAZOLIN <=4 SENSITIVE Sensitive     CEFEPIME <=0.12 SENSITIVE Sensitive     CEFTAZIDIME <=1 SENSITIVE Sensitive     CEFTRIAXONE <=0.25 SENSITIVE Sensitive     CIPROFLOXACIN 1 SENSITIVE Sensitive     GENTAMICIN <=1 SENSITIVE Sensitive     IMIPENEM <=0.25 SENSITIVE Sensitive     TRIMETH/SULFA <=20 SENSITIVE Sensitive     AMPICILLIN/SULBACTAM 4 SENSITIVE Sensitive     PIP/TAZO <=4 SENSITIVE Sensitive     * ABUNDANT KLEBSIELLA PNEUMONIAE  Expectorated sputum assessment w rflx to resp cult     Status: None   Collection Time: 11/13/20  4:06 PM   Specimen: Expectorated Sputum  Result Value Ref Range Status   Specimen Description EXPECTORATED SPUTUM  Final   Special Requests NONE  Final   Sputum evaluation   Final    THIS SPECIMEN IS ACCEPTABLE FOR SPUTUM CULTURE Performed  at [facility], [place] [address], [place], [place] [address]    Report Status 11/14/2020 FINAL  Final  Culture, respiratory     Status: None   Collection Time: 11/13/20  4:06 PM  Result Value Ref Range Status   Specimen Description EXPECTORATED SPUTUM  Final   Special Requests NONE Reflexed from [id number]  Final    Gram Stain   Final    NO WBC SEEN FEW GRAM NEGATIVE RODS Performed at [facility], [place] [address] [place], [place] [address]    Culture MODERATE STENOTROPHOMONAS MALTOPHILIA  Final   Report Status 11/17/2020 FINAL  Final   Organism ID, Bacteria STENOTROPHOMONAS MALTOPHILIA  Final      Susceptibility   Stenotrophomonas maltophilia - MIC*    LEVOFLOXACIN 2 SENSITIVE Sensitive     TRIMETH/SULFA <=20 SENSITIVE Sensitive     * MODERATE STENOTROPHOMONAS MALTOPHILIA    Coagulation Studies: No results for input(s): LABPROT, INR in the last 72 hours.  Urinalysis: No results for input(s): COLORURINE, LABSPEC, PHURINE, GLUCOSEU, HGBUR, BILIRUBINUR, KETONESUR, PROTEINUR, UROBILINOGEN, NITRITE, LEUKOCYTESUR in the last 72 hours.  Invalid input(s): APPERANCEUR    Imaging: No results found.   Medications:     iohexol, lidocaine  Assessment/ Plan:  54 y.o. 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   1.  ESRD on HD.  Patient seen and evaluated during hemodialysis treatment.  Blood flow rate currently 400.  Plan to complete dialysis treatment today.  2.  Hypotension.  Patient has albumin and midodrine available for blood pressure support.  3.  Anemia of chronic kidney disease.   Lab Results  Component Value Date   HGB 7.7 (L) 11/20/2020  Hemoglobin 7.7 at last check.  Continue to monitor CBC.  4.  Secondary hyperparathyroidism.  Phosphorus a bit high at 6.2 but should come down with dialysis treatment today.  5.  Acute respiratory failure.   Patient continues to have stable respiratory pattern off of the ventilator and status post decannulation.    LOS: 0 Jeremy Yu 11/29/20217:49 AM

## 2020-11-21 LAB — PATHOLOGIST SMEAR REVIEW

## 2020-11-22 LAB — CBC
HCT: 24.8 % — ABNORMAL LOW (ref 39.0–52.0)
Hemoglobin: 7.5 g/dL — ABNORMAL LOW (ref 13.0–17.0)
MCH: 28.4 pg (ref 26.0–34.0)
MCHC: 30.2 g/dL (ref 30.0–36.0)
MCV: 93.9 fL (ref 80.0–100.0)
Platelets: 857 10*3/uL — ABNORMAL HIGH (ref 150–400)
RBC: 2.64 MIL/uL — ABNORMAL LOW (ref 4.22–5.81)
RDW: 20.8 % — ABNORMAL HIGH (ref 11.5–15.5)
WBC: 15.3 10*3/uL — ABNORMAL HIGH (ref 4.0–10.5)
nRBC: 0.3 % — ABNORMAL HIGH (ref 0.0–0.2)

## 2020-11-22 LAB — RENAL FUNCTION PANEL
Albumin: 2.4 g/dL — ABNORMAL LOW (ref 3.5–5.0)
Anion gap: 15 (ref 5–15)
BUN: 60 mg/dL — ABNORMAL HIGH (ref 6–20)
CO2: 26 mmol/L (ref 22–32)
Calcium: 12.6 mg/dL — ABNORMAL HIGH (ref 8.9–10.3)
Chloride: 93 mmol/L — ABNORMAL LOW (ref 98–111)
Creatinine, Ser: 3.87 mg/dL — ABNORMAL HIGH (ref 0.61–1.24)
GFR, Estimated: 18 mL/min — ABNORMAL LOW (ref >60)
Glucose, Bld: 80 mg/dL (ref 70–99)
Phosphorus: 4.4 mg/dL (ref 2.5–4.6)
Potassium: 3.5 mmol/L (ref 3.5–5.1)
Sodium: 134 mmol/L — ABNORMAL LOW (ref 135–145)

## 2020-11-22 LAB — MAGNESIUM: Magnesium: 2.3 mg/dL (ref 1.7–2.4)

## 2020-11-22 NOTE — Progress Notes (Signed)
[facility]  ROUNDING NOTE   Subjective:  Patient due for dialysis treatment today. Currently sitting up in chair.   Objective:  Vital signs in last 24 hours:  Temperature 97.2 pulse 98 respirations 20 blood pressure 128/72   Physical Exam: General:  Chronically ill-appearing  Head:  Normocephalic, atraumatic. Moist oral mucosal membranes  Eyes:  Anicteric  Neck:  Supple  Lungs:   Scattered rhonchi, normal effort  Heart:  S1S2 no rubs  Abdomen:   Soft, nontender, bowel sounds present, PEG in place, colostomy  Extremities:  Right AKA, no left lower extremity edema  Neurologic:  Awake, alert, follows commands  Skin:  No acute rash  Access:  Left upper extremity AV fistula    Basic Metabolic Panel: Recent Labs  Lab 11/17/20 0453 11/20/20 0405 11/20/20 0607 11/22/20 0424  NA 130*  --  133* 134*  K 4.3  --  3.6 3.5  CL 91*  --  91* 93*  CO2 26  --  25 26  GLUCOSE 113*  --  108* 80  BUN 61*  --  81* 60*  CREATININE 3.50*  --  4.39* 3.87*  CALCIUM 12.2*  --  12.2* 12.6*  MG 2.4 2.5*  --  2.3  PHOS 4.8*  --  6.2* 4.4    Liver Function Tests: Recent Labs  Lab 11/17/20 0453 11/20/20 0607 11/22/20 0424  ALBUMIN 2.5* 2.4* 2.4*   No results for input(s): LIPASE, AMYLASE in the last 168 hours. No results for input(s): AMMONIA in the last 168 hours.  CBC: Recent Labs  Lab 11/17/20 0453 11/19/20 0342 11/20/20 0405 11/22/20 0424  WBC 23.5* 17.8* 15.6* 15.3*  HGB 7.8* 7.9* 7.7* 7.5*  HCT 26.9* 27.0* 26.3* 24.8*  MCV 95.4 95.4 95.3 93.9  PLT 926* 996* 931* 857*    Cardiac Enzymes: No results for input(s): CKTOTAL, CKMB, CKMBINDEX, TROPONINI in the last 168 hours.  BNP: Invalid input(s): POCBNP  CBG: No results for input(s): GLUCAP in the last 168 hours.  Microbiology: Results for orders placed or performed during the hospital encounter of 09/22/20  Culture, respiratory (non-expectorated)     Status: None   Collection Time: 09/23/20 11:25  AM   Specimen: Tracheal Aspirate; Respiratory  Result Value Ref Range Status   Specimen Description TRACHEAL ASPIRATE  Final   Special Requests NONE  Final   Gram Stain   Final    RARE WBC PRESENT,BOTH PMN AND MONONUCLEAR NO ORGANISMS SEEN Performed at [facility], [address], [place], [place] [address]    Culture FEW KLEBSIELLA PNEUMONIAE  Final   Report Status 09/25/2020 FINAL  Final   Organism ID, Bacteria KLEBSIELLA PNEUMONIAE  Final      Susceptibility   Klebsiella pneumoniae - MIC*    AMPICILLIN >=32 RESISTANT Resistant     CEFAZOLIN <=4 SENSITIVE Sensitive     CEFEPIME 0.25 SENSITIVE Sensitive     CEFTAZIDIME <=1 SENSITIVE Sensitive     CEFTRIAXONE 1 SENSITIVE Sensitive     CIPROFLOXACIN <=0.25 SENSITIVE Sensitive     GENTAMICIN <=1 SENSITIVE Sensitive     IMIPENEM <=0.25 SENSITIVE Sensitive     TRIMETH/SULFA <=20 SENSITIVE Sensitive     AMPICILLIN/SULBACTAM 16 INTERMEDIATE Intermediate     PIP/TAZO 64 INTERMEDIATE Intermediate     * FEW KLEBSIELLA PNEUMONIAE  Culture, blood (routine x 2)     Status: None   Collection Time: 09/23/20  4:26 PM   Specimen: BLOOD  Result Value Ref Range Status  Specimen Description BLOOD BLOOD RIGHT HAND  Final   Special Requests   Final    AEROBIC BOTTLE ONLY Blood Culture results may not be optimal due to an inadequate volume of blood received in culture bottles   Culture   Final    NO GROWTH 5 DAYS Performed at [facility], [address] [place], [place] [address]    Report Status 09/28/2020 FINAL  Final  Culture, blood (single)     Status: None   Collection Time: 09/24/20  6:00 AM   Specimen: BLOOD RIGHT HAND  Result Value Ref Range Status   Specimen Description BLOOD RIGHT HAND  Final   Special Requests   Final    BOTTLES DRAWN AEROBIC ONLY Blood Culture results may not be optimal due to an inadequate volume of blood received in culture bottles   Culture   Final    NO GROWTH 5 DAYS Performed at [facility], [address] [place], [place] [address]    Report Status 09/29/2020 FINAL  Final  Body fluid culture     Status: None   Collection Time: 09/29/20 12:27 PM   Specimen: PATH Cytology Peritoneal fluid  Result Value Ref Range Status   Specimen Description PERITONEAL FLUID  Final   Special Requests NONE  Final   Gram Stain   Final    WBC PRESENT,BOTH PMN AND MONONUCLEAR NO ORGANISMS SEEN CYTOSPIN SMEAR    Culture   Final    NO GROWTH 3 DAYS Performed at [facility], [address] [place], [place] [address]    Report Status 10/03/2020 FINAL  Final  Culture, blood (Routine X 2) w Reflex to ID Panel     Status: Abnormal   Collection Time: 10/04/20 12:50 PM   Specimen: BLOOD  Result Value Ref Range Status   Specimen Description BLOOD RIGHT ANTECUBITAL  Final   Special Requests   Final    BOTTLES DRAWN AEROBIC AND ANAEROBIC Blood Culture adequate volume   Culture  Setup Time   Final    GRAM POSITIVE COCCI IN CLUSTERS AEROBIC BOTTLE ONLY CRITICAL RESULT CALLED TO, READ BACK BY AND VERIFIED WITH: A. Janee Morn RN 16:20 10/05/20 ([email or web address]) Performed at [facility], [address] [place], [place] [address]    Culture STAPHYLOCOCCUS EPIDERMIDIS (A)  Final   Report Status 10/07/2020 FINAL  Final   Organism ID, Bacteria STAPHYLOCOCCUS EPIDERMIDIS  Final      Susceptibility   Staphylococcus epidermidis - MIC*    CIPROFLOXACIN >=8 RESISTANT Resistant     ERYTHROMYCIN >=8 RESISTANT Resistant     GENTAMICIN >=16 RESISTANT Resistant     OXACILLIN >=4 RESISTANT Resistant     TETRACYCLINE 2 SENSITIVE Sensitive     VANCOMYCIN 2 SENSITIVE Sensitive     TRIMETH/SULFA 160 RESISTANT Resistant     CLINDAMYCIN >=8 RESISTANT Resistant     RIFAMPIN <=0.5 SENSITIVE Sensitive     Inducible Clindamycin NEGATIVE Sensitive     * STAPHYLOCOCCUS EPIDERMIDIS  Blood Culture ID Panel (Reflexed)     Status: Abnormal   Collection Time: 10/04/20 12:50 PM  Result Value Ref Range Status    Enterococcus faecalis NOT DETECTED NOT DETECTED Final   Enterococcus Faecium NOT DETECTED NOT DETECTED Final   Listeria monocytogenes NOT DETECTED NOT DETECTED Final   Staphylococcus species DETECTED (A) NOT DETECTED Final    Comment: CRITICAL RESULT CALLED TO, READ BACK BY AND VERIFIED WITH: A. Janee Morn RN 16:20 10/05/20 ([email or web address])  Staphylococcus aureus (BCID) NOT DETECTED NOT DETECTED Final   Staphylococcus epidermidis DETECTED (A) NOT DETECTED Final    Comment: Methicillin (oxacillin) resistant coagulase negative staphylococcus. Possible blood culture contaminant (unless isolated from more than one blood culture draw or clinical case suggests pathogenicity). No antibiotic treatment is indicated for blood  culture contaminants. CRITICAL RESULT CALLED TO, READ BACK BY AND VERIFIED WITH: A. Grandville Silos RN 16:20 10/05/20 (wilsonm)    Staphylococcus lugdunensis NOT DETECTED NOT DETECTED Final   Streptococcus species NOT DETECTED NOT DETECTED Final   Streptococcus agalactiae NOT DETECTED NOT DETECTED Final   Streptococcus pneumoniae NOT DETECTED NOT DETECTED Final   Streptococcus pyogenes NOT DETECTED NOT DETECTED Final   A.calcoaceticus-baumannii NOT DETECTED NOT DETECTED Final   Bacteroides fragilis NOT DETECTED NOT DETECTED Final   Enterobacterales NOT DETECTED NOT DETECTED Final   Enterobacter cloacae complex NOT DETECTED NOT DETECTED Final   Escherichia coli NOT DETECTED NOT DETECTED Final   Klebsiella aerogenes NOT DETECTED NOT DETECTED Final   Klebsiella oxytoca NOT DETECTED NOT DETECTED Final   Klebsiella pneumoniae NOT DETECTED NOT DETECTED Final   Proteus species NOT DETECTED NOT DETECTED Final   Salmonella species NOT DETECTED NOT DETECTED Final   Serratia marcescens NOT DETECTED NOT DETECTED Final   Haemophilus influenzae NOT DETECTED NOT DETECTED Final   Neisseria meningitidis NOT DETECTED NOT DETECTED Final   Pseudomonas aeruginosa NOT DETECTED NOT DETECTED Final    Stenotrophomonas maltophilia NOT DETECTED NOT DETECTED Final   Candida albicans NOT DETECTED NOT DETECTED Final   Candida auris NOT DETECTED NOT DETECTED Final   Candida glabrata NOT DETECTED NOT DETECTED Final   Candida krusei NOT DETECTED NOT DETECTED Final   Candida parapsilosis NOT DETECTED NOT DETECTED Final   Candida tropicalis NOT DETECTED NOT DETECTED Final   Cryptococcus neoformans/gattii NOT DETECTED NOT DETECTED Final   Methicillin resistance mecA/C DETECTED (A) NOT DETECTED Final    Comment: CRITICAL RESULT CALLED TO, READ BACK BY AND VERIFIED WITH: A. Grandville Silos RN 16:20 10/05/20 (wilsonm) Performed at [facility], [place] [address], [place], [place] [address]   Culture, blood (Routine X 2) w Reflex to ID Panel     Status: Abnormal   Collection Time: 10/04/20 12:51 PM   Specimen: BLOOD RIGHT HAND  Result Value Ref Range Status   Specimen Description BLOOD RIGHT HAND  Final   Special Requests   Final    BOTTLES DRAWN AEROBIC AND ANAEROBIC Blood Culture adequate volume   Culture  Setup Time   Final    GRAM POSITIVE COCCI AEROBIC BOTTLE ONLY CRITICAL VALUE NOTED.  VALUE IS CONSISTENT WITH PREVIOUSLY REPORTED AND CALLED VALUE.    Culture (A)  Final    STAPHYLOCOCCUS EPIDERMIDIS SUSCEPTIBILITIES PERFORMED ON PREVIOUS CULTURE WITHIN THE LAST 5 DAYS. Performed at [facility], [place] [address], [place], [place] [address]    Report Status 10/07/2020 FINAL  Final  C Difficile Quick Screen (NO PCR Reflex)     Status: None   Collection Time: 10/04/20  5:19 PM   Specimen: STOOL  Result Value Ref Range Status   C Diff antigen NEGATIVE NEGATIVE Final   C Diff toxin NEGATIVE NEGATIVE Final   C Diff interpretation No C. difficile detected.  Final    Comment: Performed at [facility], [place] [address], [place], [place] [address]  Culture, respiratory (non-expectorated)     Status: None   Collection Time: 10/05/20 10:14 PM   Specimen: Tracheal Aspirate; Respiratory   Result  Value Ref Range Status   Specimen Description TRACHEAL ASPIRATE  Final   Special Requests NONE  Final   Gram Stain   Final    MODERATE WBC PRESENT, PREDOMINANTLY PMN MODERATE GRAM NEGATIVE RODS FEW YEAST RARE GRAM POSITIVE COCCI IN CHAINS Performed at [facility], [address], [place], [place] [address]    Culture MODERATE KLEBSIELLA PNEUMONIAE  Final   Report Status 10/08/2020 FINAL  Final   Organism ID, Bacteria KLEBSIELLA PNEUMONIAE  Final      Susceptibility   Klebsiella pneumoniae - MIC*    AMPICILLIN RESISTANT Resistant     CEFAZOLIN <=4 SENSITIVE Sensitive     CEFEPIME <=0.12 SENSITIVE Sensitive     CEFTAZIDIME <=1 SENSITIVE Sensitive     CEFTRIAXONE <=0.25 SENSITIVE Sensitive     CIPROFLOXACIN >=4 RESISTANT Resistant     GENTAMICIN <=1 SENSITIVE Sensitive     IMIPENEM <=0.25 SENSITIVE Sensitive     TRIMETH/SULFA <=20 SENSITIVE Sensitive     AMPICILLIN/SULBACTAM 8 SENSITIVE Sensitive     PIP/TAZO 16 SENSITIVE Sensitive     * MODERATE KLEBSIELLA PNEUMONIAE  Culture, blood (routine x 2)     Status: None   Collection Time: 10/08/20  4:17 PM   Specimen: BLOOD RIGHT WRIST  Result Value Ref Range Status   Specimen Description BLOOD RIGHT WRIST  Final   Special Requests   Final    BOTTLES DRAWN AEROBIC ONLY Blood Culture results may not be optimal due to an excessive volume of blood received in culture bottles   Culture   Final    NO GROWTH 5 DAYS Performed at [facility], [place] [address] [place], [place] [address]    Report Status 10/13/2020 FINAL  Final  Culture, blood (routine x 2)     Status: None   Collection Time: 10/08/20  4:17 PM   Specimen: BLOOD RIGHT HAND  Result Value Ref Range Status   Specimen Description BLOOD RIGHT HAND  Final   Special Requests   Final    BOTTLES DRAWN AEROBIC ONLY Blood Culture results may not be optimal due to an excessive volume of blood received in culture bottles   Culture   Final    NO GROWTH 5  DAYS Performed at [facility], [place] [address] [place], [place] [address]    Report Status 10/13/2020 FINAL  Final  Culture, respiratory (non-expectorated)     Status: None   Collection Time: 10/14/20  1:41 PM   Specimen: Tracheal Aspirate; Respiratory  Result Value Ref Range Status   Specimen Description TRACHEAL ASPIRATE  Final   Special Requests NONE  Final   Gram Stain   Final    RARE WBC PRESENT, PREDOMINANTLY PMN RARE GRAM NEGATIVE RODS    Culture   Final    RARE Normal respiratory flora-no Staph aureus or Pseudomonas seen Performed at [facility], [address], [place], [place] [address]    Report Status 10/17/2020 FINAL  Final  Culture, blood (routine x 2)     Status: Abnormal   Collection Time: 10/27/20  9:24 PM   Specimen: BLOOD  Result Value Ref Range Status   Specimen Description BLOOD RIGHT HAND  Final   Special Requests   Final    BOTTLES DRAWN AEROBIC ONLY Blood Culture adequate volume   Culture  Setup Time   Final    Organism ID to follow YEAST AEROBIC BOTTLE ONLY CRITICAL RESULT CALLED TO, READ BACK BY AND VERIFIED WITH:  RN D [place] [phone] AT 1400 BY CM    Culture (A)  Final    CANDIDA GLABRATA SEE SEPARATE REPORT FOR SUSCEPTIBILITY RESULTS Performed at [facility] [employer] Performed at [facility], [place] [address] [place], [place] [address]    Report Status 11/13/2020 FINAL  Final  Culture, blood (routine x 2)     Status: None   Collection Time: 10/27/20  9:24 PM   Specimen: BLOOD  Result Value Ref Range Status   Specimen Description BLOOD RIGHT ARM  Final   Special Requests   Final    BOTTLES DRAWN AEROBIC ONLY Blood Culture adequate volume   Culture   Final    NO GROWTH 5 DAYS Performed at [facility], [place] [address] [place], [place] [address]    Report Status 11/01/2020 FINAL  Final  Blood Culture ID Panel (Reflexed)     Status: Abnormal   Collection Time: 10/27/20  9:24 PM  Result Value Ref Range Status    Enterococcus faecalis NOT DETECTED NOT DETECTED Final   Enterococcus Faecium NOT DETECTED NOT DETECTED Final   Listeria monocytogenes NOT DETECTED NOT DETECTED Final   Staphylococcus species NOT DETECTED NOT DETECTED Final   Staphylococcus aureus (BCID) NOT DETECTED NOT DETECTED Final   Staphylococcus epidermidis NOT DETECTED NOT DETECTED Final   Staphylococcus lugdunensis NOT DETECTED NOT DETECTED Final   Streptococcus species NOT DETECTED NOT DETECTED Final   Streptococcus agalactiae NOT DETECTED NOT DETECTED Final   Streptococcus pneumoniae NOT DETECTED NOT DETECTED Final   Streptococcus pyogenes NOT DETECTED NOT DETECTED Final   A.calcoaceticus-baumannii NOT DETECTED NOT DETECTED Final   Bacteroides fragilis NOT DETECTED NOT DETECTED Final   Enterobacterales NOT DETECTED NOT DETECTED Final   Enterobacter cloacae complex NOT DETECTED NOT DETECTED Final   Escherichia coli NOT DETECTED NOT DETECTED Final   Klebsiella aerogenes NOT DETECTED NOT DETECTED Final   Klebsiella oxytoca NOT DETECTED NOT DETECTED Final   Klebsiella pneumoniae NOT DETECTED NOT DETECTED Final   Proteus species NOT DETECTED NOT DETECTED Final   Salmonella species NOT DETECTED NOT DETECTED Final   Serratia marcescens NOT DETECTED NOT DETECTED Final   Haemophilus influenzae NOT DETECTED NOT DETECTED Final   Neisseria meningitidis NOT DETECTED NOT DETECTED Final   Pseudomonas aeruginosa NOT DETECTED NOT DETECTED Final   Stenotrophomonas maltophilia NOT DETECTED NOT DETECTED Final   Candida albicans NOT DETECTED NOT DETECTED Final   Candida auris NOT DETECTED NOT DETECTED Final   Candida glabrata DETECTED (A) NOT DETECTED Final    Comment: CRITICAL RESULT CALLED TO, READ BACK BY AND VERIFIED WITH: PHARMD M MITCHELL [id number] AT 1350 BY CM    Candida krusei NOT DETECTED NOT DETECTED Final   Candida parapsilosis NOT DETECTED NOT DETECTED Final   Candida tropicalis NOT DETECTED NOT DETECTED Final   Cryptococcus  neoformans/gattii NOT DETECTED NOT DETECTED Final    Comment: Performed at [facility], [address] [place], [place] [address]  Gram stain     Status: None   Collection Time: 10/31/20  3:16 PM   Specimen: Abdomen; Peritoneal Fluid  Result Value Ref Range Status   Specimen Description FLUID PERITONEAL ABDOMEN  Final   Special Requests NONE  Final   Gram Stain   Final    RARE WBC PRESENT,BOTH PMN AND MONONUCLEAR NO ORGANISMS SEEN Performed at [facility], [place] [address], [place], [place] [address]    Report Status 10/31/2020 FINAL  Final  Culture, body fluid-bottle  Status: None   Collection Time: 10/31/20  3:16 PM   Specimen: Fluid  Result Value Ref Range Status   Specimen Description FLUID PERITONEAL ABDOMEN  Final   Special Requests BOTTLES DRAWN AEROBIC AND ANAEROBIC  Final   Culture   Final    NO GROWTH 5 DAYS Performed at [facility], [address], [place], [place] [address]    Report Status 11/05/2020 FINAL  Final  Culture, blood (routine x 2)     Status: None   Collection Time: 11/03/20  3:32 PM   Specimen: BLOOD  Result Value Ref Range Status   Specimen Description BLOOD LEFT ANTECUBITAL  Final   Special Requests   Final    BOTTLES DRAWN AEROBIC AND ANAEROBIC Blood Culture results may not be optimal due to an inadequate volume of blood received in culture bottles   Culture   Final    NO GROWTH 5 DAYS Performed at [facility], [address] [place], [place] [address]    Report Status 11/08/2020 FINAL  Final  Culture, blood (routine x 2)     Status: None   Collection Time: 11/03/20  3:45 PM   Specimen: BLOOD  Result Value Ref Range Status   Specimen Description BLOOD LEFT ANTECUBITAL  Final   Special Requests   Final    BOTTLES DRAWN AEROBIC ONLY Blood Culture adequate volume   Culture   Final    NO GROWTH 5 DAYS Performed at [facility], [address] [place], [place] [address]    Report Status 11/08/2020 FINAL   Final  Culture, respiratory (non-expectorated)     Status: None   Collection Time: 11/03/20  4:44 PM   Specimen: Tracheal Aspirate; Respiratory  Result Value Ref Range Status   Specimen Description TRACHEAL ASPIRATE  Final   Special Requests NONE  Final   Gram Stain   Final    FEW WBC PRESENT, PREDOMINANTLY PMN ABUNDANT GRAM NEGATIVE RODS MODERATE GRAM POSITIVE RODS FEW GRAM POSITIVE COCCI FEW YEAST Performed at [facility], [address] [place], [place] [address]    Culture ABUNDANT KLEBSIELLA PNEUMONIAE  Final   Report Status 11/05/2020 FINAL  Final   Organism ID, Bacteria KLEBSIELLA PNEUMONIAE  Final      Susceptibility   Klebsiella pneumoniae - MIC*    AMPICILLIN RESISTANT Resistant     CEFAZOLIN <=4 SENSITIVE Sensitive     CEFEPIME <=0.12 SENSITIVE Sensitive     CEFTAZIDIME <=1 SENSITIVE Sensitive     CEFTRIAXONE <=0.25 SENSITIVE Sensitive     CIPROFLOXACIN 1 SENSITIVE Sensitive     GENTAMICIN <=1 SENSITIVE Sensitive     IMIPENEM <=0.25 SENSITIVE Sensitive     TRIMETH/SULFA <=20 SENSITIVE Sensitive     AMPICILLIN/SULBACTAM 4 SENSITIVE Sensitive     PIP/TAZO <=4 SENSITIVE Sensitive     * ABUNDANT KLEBSIELLA PNEUMONIAE  Expectorated sputum assessment w rflx to resp cult     Status: None   Collection Time: 11/13/20  4:06 PM   Specimen: Expectorated Sputum  Result Value Ref Range Status   Specimen Description EXPECTORATED SPUTUM  Final   Special Requests NONE  Final   Sputum evaluation   Final    THIS SPECIMEN IS ACCEPTABLE FOR SPUTUM CULTURE Performed at [facility], [address] [place], [place] [address]    Report Status 11/14/2020 FINAL  Final  Culture, respiratory     Status: None   Collection Time: 11/13/20  4:06 PM  Result Value Ref Range Status   Specimen Description EXPECTORATED SPUTUM  Final   Special Requests NONE Reflexed from [id number]  Final   Gram Stain   Final    NO WBC SEEN FEW GRAM NEGATIVE RODS Performed at [place], [address] [place], [place] [address]    Culture MODERATE STENOTROPHOMONAS MALTOPHILIA  Final   Report Status 11/17/2020 FINAL  Final   Organism ID, Bacteria STENOTROPHOMONAS MALTOPHILIA  Final      Susceptibility   Stenotrophomonas maltophilia - MIC*    LEVOFLOXACIN 2 SENSITIVE Sensitive     TRIMETH/SULFA <=20 SENSITIVE Sensitive     * MODERATE STENOTROPHOMONAS MALTOPHILIA    Coagulation Studies: No results for input(s): LABPROT, INR in the last 72 hours.  Urinalysis: No results for input(s): COLORURINE, LABSPEC, PHURINE, GLUCOSEU, HGBUR, BILIRUBINUR, KETONESUR, PROTEINUR, UROBILINOGEN, NITRITE, LEUKOCYTESUR in the last 72 hours.  Invalid input(s): APPERANCEUR    Imaging: DG Forearm Right  Result Date: 11/20/2020 CLINICAL DATA:  Right arm pain without known injury EXAM: RIGHT FOREARM - 2 VIEW COMPARISON:  None. FINDINGS: There is no evidence of fracture or other focal bone lesions. Olecranon enthesophyte with some thickening of the olecranon bursa. Vascular calcifications. IMPRESSION: Probable olecranon bursitis.  No acute osseous abnormality. Electronically Signed   By: Dahlia Bailiff MD   On: 11/20/2020 18:33     Medications:     iohexol, lidocaine  Assessment/ Plan:  54 y.o. 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   1.  ESRD on HD.  Patient due for hemodialysis treatment today.  Ultrafiltration target 1.5 to 2 kg.  2.  Hypotension.  Blood pressure has improved.  Blood pressure currently 128/72.  3.  Anemia of chronic kidney  disease.   Lab Results  Component Value Date   HGB 7.5 (L) 11/22/2020  Hemoglobin down a bit to 7.5.  No immediate need for blood transfusion.  Continue to monitor CBC.  4.  Secondary hyperparathyroidism.  Phosphorus now down to 4.4 and is at target.  5.  Acute respiratory failure.  Continues to have nonlabored respirations and doing well off the ventilator and status post decannulation of tracheostomy.    LOS: 0 Jeremy Yu 12/1/202111:04 AM

## 2020-11-23 NOTE — Progress Notes (Signed)
PROGRESS NOTE    Jeremy Yu  MGN:[id number] DOB: [birth year] DOA: 09/22/2020  Brief Narrative:  Jeremy Yu an 54 y.o.malewith medical history significant of end-stage renal disease on hemodialysis, diabetic nephropathy, chronic systolic and diastolic congestive heart failure, peripheral arterial disease, complete heart block, seizures, hyperlipidemia, hypertension, prior MI, GERD who was hospitalized from 07/17/2020 until 07/25/2020 for necrotizing soft tissue infection, sepsis. 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 He was discharged on 07/25/2020. Patient apparently was at [facility] and rehab facility. While undergoing dialysis on 08/19/2020 when he developed chills, rigors. Blood cultures were taken and he was admitted to [facility] on 08/19/2020. He was given IV vancomycin, ceftriaxone for probable sepsis. He was found to have diarrhea and stool for C. difficile was positive. Patient was placed on oral vancomycin. CT on 08/20/2020 confirmed pancolitis with large left inguinal hernia, airspace disease with concern for pneumonia. On 08/21/2020 when he became hypotensive, both IV and p.o. Flagyl was added to the p.o. vancomycin. Infectious disease was consulted with addition of Dificid. However, patient continued to worsen. On 08/23/2020 when he was transferred urgently to the ICU and intubated for airway protection because he was becoming encephalopathic. He was also given vasopressors for shock. Fecal transplant was attempted by GI at the bedside however, inability to pass the scope due to large nonreducible inguinal hernia. Surgery was consulted and patient was taken emergently for subtotal colectomy and ileostomy. Eravacyclinewas added for C. difficile/toxic megacolon. He was  seen by surgery with clearance for tube feeds on 08/25/2020. Postoperative course complicated by encephalopathy with gradual improvement. However, he had significant drop in platelet count on 08/27/2020. Hematology was consulted and they thought the thrombocytopenia was likely secondary to sepsis, recent surgery, critical illness, DIC. He was given platelet transfusion with additional PRBC due to oozing from the surgical site, drop in hemoglobin levels. He was also given vitamin K for ongoing liver dysfunction. He apparently received total of 8 units platelets. Repeat CT showed left lingular consolidation, bilateral effusions. Abdomen showed postop free fluid, gallstones. He had progressive thrombocytopenia, abnormal LFTs therefore Flagyl, Eravacyclinewas stopped. He had blood cultures on 08/28/2020 that showed staph. He was treated with empiric antibiotics. He was also treated with micafungin. On 09/04/2020 patient had PEA arrest during which he was intubated, subsequently underwent EGD with evidence of bleeding ulcer, gastritis. He continued to have oozing from his ileostomy and was receiving blood products. He was placed on PPI drip. Right upper quadrant ultrasound showed possible cholecystitis. He was to undergo drain placement but underwent HIDA on 09/09/2020, gallbladder was not visualized. On 09/10/2020 when he had large amount of blood from the stoma and received PRBC. He had EGD with bleeding ulcer that was clipped. On 09/11/2020 when he had paracentesis with 5.2 L removed. Patient had fluid cultures grew VRE for which he was treated with daptomycin for 14 days. He failed ventilator weaning efforts therefore tracheostomy was done on 09/14/2020. He has unstageable sacral pressure ulcer for which he received wound care. He was transferred and admitted to [facility] on 09/22/2020. -He previously had a trach but now decannulated. He has received multiple antibiotics for Klebsiella  pneumonia and also for worsening leukocytosis with fevers.   -As soon as antibiotics are discontinued he starts having fevers along with worsening leukocytosis.   WBC count worsened again with low-grade fevers.  His blood cultures from 10/27/2020 showed Candida glabrata.  He completed treatment with micafungin.  His repeat blood cultures from 11/03/2020 did not show any growth. 11/23/2020: He started having worsening leukocytosis again.  Respiratory culture from 11/13/2020 showed Stenotrophomonas maltophilia.  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 He had severe sepsis with septic shock at the acute facility. He also had PEA arrest at the outside facility and had to be intubated.He had pneumonia with Klebsiella.  He received treatment with multiple rounds of antibiotics including IV vancomycin, meropenem, Flagyl.  Respiratory status stable at this time. He is however, complaining of cough with some sputum and remains very high risk for aspiration and recurrent aspiration pneumonia. -His repeat respiratory cultures from 11/13/2020 showed Stenotrophomonas maltophilia.  He also had worsening leukocytosis therefore started on Avycaz, Levaquin (renally dosed). If he continues to have worsening leukocytosis suggest CT imaging which could be done without contrast given his renal issues.  Sepsis  with shock: Patient was on pressors at the outside facility. Shock resolved.  He already completed multiple rounds of antibiotics.  However, as soon as antibiotics are stopped he starts having fever with worsening leukocytosis. He is high risk for recurrent sepsis.  He recently received treatment with IV meropenem, Flagyl.    After stopping the meropenem he started having leukocytosis with low-grade fevers again.  His blood cultures from 10/27/2020 showed Candida glabrata.  Repeat blood cultures from 11/03/2020 did not show any growth to date. He completed treatment with micafungin.  -Now having worsening leukocytosis again.  Has been started on Avycaz, Levaquin with tentative end date 11/27/2020.  Candidemia: As mentioned above blood cultures from 10/27/2020 showed Candida glabrata.  On treatment with micafungin. Requested lab to set up susceptibilities for the Candida.  Repeat blood cultures to document clearance.  Completed micafungin on 11/17/2020.  Leukocytosis: Treated with multiple rounds of antibiotics as mentioned above.  WBC count improved initially but soon as antibiotics are discontinued he starts having worsening leukocytosis with low-grade fevers.  As mentioned above, his blood cultures from 10/27/2020 showed Candida glabrata.  Completed treatment with micafungin.  Due to worsening leukocytosis empiric meropenem added again for probable aspiration pneumonia which he completed on 11/11/2020.  However, now again having elevated WBC count.   Etiology unclear.  He has sacrococcygeal pressure ulcer unstageable which could also be contributing to the leukocytosis. -Since he is complaining of cough with sputum started on Avycaz and Levaquin.  Continue to monitor WBC count. He is high risk for recurrent sepsis.  Fortunately blood cultures from 11/03/2020 did not show any growth.  However, if he is continuing to have worsening WBC count or fevers would recommend to send for pan cultures.  -He also had  multiple CT's of the abdomen and pelvis the last imaging done on 11/02/2020.  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 Continue local wound care. Antibiotics as mentioned above.  He received p.o. vancomycin.  Monitor closely.  If he starts having worsening diarrhea recommend to send stool for  C. Difficile.  Peritonitis: Patient had peritonitis at the acute facility with peritoneal fluid cultures that showed VRE. He status post treatment with 14 days of daptomycin.  CT of the abdomen and pelvis from 10/30/2020 showed fluid collection suspected to be ascites. He is status post paracentesis with removal of 100 mL of dark brown fluid.   Fluid analysis on Gram stain showed rare WBC both PMN and mononuclear, no organisms.  He received treatment with  multiple rounds of antibiotics due to worsening leukocytosis. He is at high risk for recurrent peritonitis. Continue to monitor closely.  Repeat abdominal imaging  if needed.  Cholecystitis: He also had cholecystitis and is status post drain placement.  Drain removed by surgery. Antibiotics as mentioned above.  Ascites: He continues to have recurrent ascites status post paracentesis x3.  Previously had peritonitis with VRE and completed antibiotic treatment for that.  Continue management per the primary team.  End-stage renal disease on dialysis: Dialysis per nephrology. Antibiotics renally dosed.  Necrotizing fasciitis status post right AKA: Continue local wound care.  Diabetes mellitus: Continue to monitor Accu-Cheks, medications and management of diabetes per the primary team. He will need proper glycemic control in order to enable healing.  Bleeding ulcer/gastritis: Status post EGD and clipping at the acute facility. On PPI. Continue to monitor. Further management per primary team.  Sacrococcygeal pressure ulcer unstageable: Continue local wound care. He underwent bedside debridement by  surgery per the primary team.  Thrombocytosis: Patient having elevated platelet count.  Continue to monitor.  Unfortunately unable to give aspirin because he also had GI bleed.  Anemia: Continue to monitor hemoglobin. Further management per the primary team.  Unfortunately due to his complex medical problems he is high risk for worsening and decompensation.  Overall poor prognosis. Plan of care discussed with the patient, primary team and pharmacy.   Subjective: He had worsening leukocytosis again. He denies having any major complaints at this time.  Objective: Vitals: Temperature 97.1, pulse 100, respiratory rate 20, blood pressure 114/63, pulse oximetry 99%   Examination: Constitutional:Awake, not in any acute distress at this time Head:Atraumatic, normocephalic Eyes:pupils equal and reactive, legally blind ENMT: external ears and nose appear normal, normal hearing, Lips appear normal,moist oral mucosa Neck: supple CVS: S1-S2  Respiratory:Occasional rhonchi, no wheezing Abdomen: Soft, nontender, postop abdominal wound, ostomy, PEG tube Musculoskeletal:Right lower extremity AKA, left upper extremity AV fistula Neuro:Legally blind, grossly nonfocal Psych:stable mood Skin: no rashes   Data Reviewed: I have personally reviewed following labs and imaging studies  CBC: Recent Labs  Lab 11/17/20 0453 11/19/20 0342 11/20/20 0405 11/22/20 0424  WBC 23.5* 17.8* 15.6* 15.3*  HGB 7.8* 7.9* 7.7* 7.5*  HCT 26.9* 27.0* 26.3* 24.8*  MCV 95.4 95.4 95.3 93.9  PLT 926* 996* 931* 857*    Basic Metabolic Panel: Recent Labs  Lab 11/17/20 0453 11/20/20 0405 11/20/20 0607 11/22/20 0424  NA 130*  --  133* 134*  K 4.3  --  3.6 3.5  CL 91*  --  91* 93*  CO2 26  --  25 26  GLUCOSE 113*  --  108* 80  BUN 61*  --  81* 60*  CREATININE 3.50*  --  4.39* 3.87*  CALCIUM 12.2*  --  12.2* 12.6*  MG 2.4 2.5*  --  2.3  PHOS 4.8*  --  6.2* 4.4    GFR: CrCl cannot be  calculated (Unknown ideal weight.).  Liver Function Tests: Recent Labs  Lab 11/17/20 0453 11/20/20 [id number] 11/22/20 0424  ALBUMIN 2.5* 2.4* 2.4*    CBG: No results for input(s): GLUCAP in the last 168 hours.   Recent Results (from the past 240 hour(s))  Expectorated sputum assessment w rflx to resp cult     Status: None   Collection Time: 11/13/20  4:06 PM   Specimen: Expectorated Sputum  Result Value Ref Range Status   Specimen Description EXPECTORATED SPUTUM  Final   Special Requests NONE  Final   Sputum evaluation   Final    THIS SPECIMEN IS ACCEPTABLE FOR SPUTUM CULTURE Performed at [facility], [address], [place], [place] [address]    Report Status 11/14/2020 FINAL  Final  Culture, respiratory     Status: None   Collection Time: 11/13/20  4:06 PM  Result Value Ref Range Status   Specimen Description EXPECTORATED SPUTUM  Final   Special Requests NONE Reflexed from [id number]  Final   Gram Stain   Final    NO WBC SEEN FEW GRAM NEGATIVE RODS Performed at [facility], [place] [address], [place], [place] [address]    Culture MODERATE STENOTROPHOMONAS MALTOPHILIA  Final   Report Status 11/17/2020 FINAL  Final   Organism ID, Bacteria STENOTROPHOMONAS MALTOPHILIA  Final      Susceptibility   Stenotrophomonas maltophilia - MIC*    LEVOFLOXACIN 2 SENSITIVE Sensitive     TRIMETH/SULFA <=20 SENSITIVE Sensitive     * MODERATE STENOTROPHOMONAS MALTOPHILIA       Radiology Studies: No results found.   Scheduled Meds: Please see MAR   Yaakov Guthrie, MD  11/23/2020, 3:28 PM

## 2020-11-24 ENCOUNTER — Other Ambulatory Visit (HOSPITAL_COMMUNITY): Payer: Medicare Other

## 2020-11-24 LAB — CBC
HCT: 26.1 % — ABNORMAL LOW (ref 39.0–52.0)
Hemoglobin: 7.5 g/dL — ABNORMAL LOW (ref 13.0–17.0)
MCH: 27.6 pg (ref 26.0–34.0)
MCHC: 28.7 g/dL — ABNORMAL LOW (ref 30.0–36.0)
MCV: 96 fL (ref 80.0–100.0)
Platelets: 891 10*3/uL — ABNORMAL HIGH (ref 150–400)
RBC: 2.72 MIL/uL — ABNORMAL LOW (ref 4.22–5.81)
RDW: 20.9 % — ABNORMAL HIGH (ref 11.5–15.5)
WBC: 13.1 10*3/uL — ABNORMAL HIGH (ref 4.0–10.5)
nRBC: 0.8 % — ABNORMAL HIGH (ref 0.0–0.2)

## 2020-11-24 LAB — RENAL FUNCTION PANEL
Albumin: 2.7 g/dL — ABNORMAL LOW (ref 3.5–5.0)
Anion gap: 15 (ref 5–15)
BUN: 55 mg/dL — ABNORMAL HIGH (ref 6–20)
CO2: 25 mmol/L (ref 22–32)
Calcium: 13.1 mg/dL (ref 8.9–10.3)
Chloride: 94 mmol/L — ABNORMAL LOW (ref 98–111)
Creatinine, Ser: 4.03 mg/dL — ABNORMAL HIGH (ref 0.61–1.24)
GFR, Estimated: 17 mL/min — ABNORMAL LOW (ref >60)
Glucose, Bld: 143 mg/dL — ABNORMAL HIGH (ref 70–99)
Phosphorus: 4.7 mg/dL — ABNORMAL HIGH (ref 2.5–4.6)
Potassium: 3.7 mmol/L (ref 3.5–5.1)
Sodium: 134 mmol/L — ABNORMAL LOW (ref 135–145)

## 2020-11-24 LAB — MAGNESIUM: Magnesium: 2.3 mg/dL (ref 1.7–2.4)

## 2020-11-24 NOTE — Progress Notes (Signed)
[facility]  ROUNDING NOTE   Subjective:  Patient due for hemodialysis treatment today. Patient laying in bed comfortably. No acute complaints.   Objective:  Vital signs in last 24 hours:  Temperature 98 pulse 102 respiration 17 blood pressure 138/75   Physical Exam: General:  Chronically ill-appearing  Head:  Normocephalic, atraumatic. Moist oral mucosal membranes  Eyes:  Anicteric  Neck:  Supple  Lungs:   Scattered rhonchi, normal effort  Heart:  S1S2 no rubs  Abdomen:   Soft, nontender, bowel sounds present, PEG in place, colostomy  Extremities:  Right AKA, no left lower extremity edema  Neurologic:  Awake, alert, follows commands  Skin:  No acute rash  Access:  Left upper extremity AV fistula    Basic Metabolic Panel: Recent Labs  Lab 11/20/20 0405 11/20/20 0607 11/22/20 0424  NA  --  133* 134*  K  --  3.6 3.5  CL  --  91* 93*  CO2  --  25 26  GLUCOSE  --  108* 80  BUN  --  81* 60*  CREATININE  --  4.39* 3.87*  CALCIUM  --  12.2* 12.6*  MG 2.5*  --  2.3  PHOS  --  6.2* 4.4    Liver Function Tests: Recent Labs  Lab 11/20/20 0607 11/22/20 0424  ALBUMIN 2.4* 2.4*   No results for input(s): LIPASE, AMYLASE in the last 168 hours. No results for input(s): AMMONIA in the last 168 hours.  CBC: Recent Labs  Lab 11/19/20 0342 11/20/20 0405 11/22/20 0424  WBC 17.8* 15.6* 15.3*  HGB 7.9* 7.7* 7.5*  HCT 27.0* 26.3* 24.8*  MCV 95.4 95.3 93.9  PLT 996* 931* 857*    Cardiac Enzymes: No results for input(s): CKTOTAL, CKMB, CKMBINDEX, TROPONINI in the last 168 hours.  BNP: Invalid input(s): POCBNP  CBG: No results for input(s): GLUCAP in the last 168 hours.  Microbiology: Results for orders placed or performed during the hospital encounter of 09/22/20  Culture, respiratory (non-expectorated)     Status: None   Collection Time: 09/23/20 11:25 AM   Specimen: Tracheal Aspirate; Respiratory  Result Value Ref Range Status   Specimen  Description TRACHEAL ASPIRATE  Final   Special Requests NONE  Final   Gram Stain   Final    RARE WBC PRESENT,BOTH PMN AND MONONUCLEAR NO ORGANISMS SEEN Performed at [facility], [address], [place], [place] [address]    Culture FEW KLEBSIELLA PNEUMONIAE  Final   Report Status 09/25/2020 FINAL  Final   Organism ID, Bacteria KLEBSIELLA PNEUMONIAE  Final      Susceptibility   Klebsiella pneumoniae - MIC*    AMPICILLIN >=32 RESISTANT Resistant     CEFAZOLIN <=4 SENSITIVE Sensitive     CEFEPIME 0.25 SENSITIVE Sensitive     CEFTAZIDIME <=1 SENSITIVE Sensitive     CEFTRIAXONE 1 SENSITIVE Sensitive     CIPROFLOXACIN <=0.25 SENSITIVE Sensitive     GENTAMICIN <=1 SENSITIVE Sensitive     IMIPENEM <=0.25 SENSITIVE Sensitive     TRIMETH/SULFA <=20 SENSITIVE Sensitive     AMPICILLIN/SULBACTAM 16 INTERMEDIATE Intermediate     PIP/TAZO 64 INTERMEDIATE Intermediate     * FEW KLEBSIELLA PNEUMONIAE  Culture, blood (routine x 2)     Status: None   Collection Time: 09/23/20  4:26 PM   Specimen: BLOOD  Result Value Ref Range Status   Specimen Description BLOOD BLOOD RIGHT HAND  Final   Special Requests   Final  AEROBIC BOTTLE ONLY Blood Culture results may not be optimal due to an inadequate volume of blood received in culture bottles   Culture   Final    NO GROWTH 5 DAYS Performed at [facility] [address], [place], [place] [address]    Report Status 09/28/2020 FINAL  Final  Culture, blood (single)     Status: None   Collection Time: 09/24/20  6:00 AM   Specimen: BLOOD RIGHT HAND  Result Value Ref Range Status   Specimen Description BLOOD RIGHT HAND  Final   Special Requests   Final    BOTTLES DRAWN AEROBIC ONLY Blood Culture results may not be optimal due to an inadequate volume of blood received in culture bottles   Culture   Final    NO GROWTH 5 DAYS Performed at [facility], [place] [address], [place], [place] [address]    Report Status 09/29/2020 FINAL   Final  Body fluid culture     Status: None   Collection Time: 09/29/20 12:27 PM   Specimen: PATH Cytology Peritoneal fluid  Result Value Ref Range Status   Specimen Description PERITONEAL FLUID  Final   Special Requests NONE  Final   Gram Stain   Final    WBC PRESENT,BOTH PMN AND MONONUCLEAR NO ORGANISMS SEEN CYTOSPIN SMEAR    Culture   Final    NO GROWTH 3 DAYS Performed at [facility], [address] [place], [place] [address]    Report Status 10/03/2020 FINAL  Final  Culture, blood (Routine X 2) w Reflex to ID Panel     Status: Abnormal   Collection Time: 10/04/20 12:50 PM   Specimen: BLOOD  Result Value Ref Range Status   Specimen Description BLOOD RIGHT ANTECUBITAL  Final   Special Requests   Final    BOTTLES DRAWN AEROBIC AND ANAEROBIC Blood Culture adequate volume   Culture  Setup Time   Final    GRAM POSITIVE COCCI IN CLUSTERS AEROBIC BOTTLE ONLY CRITICAL RESULT CALLED TO, READ BACK BY AND VERIFIED WITH: A. Grandville Silos RN 16:20 10/05/20 ([email or web address]) Performed at [facility], [place] [address], [place], [place] [address]    Culture STAPHYLOCOCCUS EPIDERMIDIS (A)  Final   Report Status 10/07/2020 FINAL  Final   Organism ID, Bacteria STAPHYLOCOCCUS EPIDERMIDIS  Final      Susceptibility   Staphylococcus epidermidis - MIC*    CIPROFLOXACIN >=8 RESISTANT Resistant     ERYTHROMYCIN >=8 RESISTANT Resistant     GENTAMICIN >=16 RESISTANT Resistant     OXACILLIN >=4 RESISTANT Resistant     TETRACYCLINE 2 SENSITIVE Sensitive     VANCOMYCIN 2 SENSITIVE Sensitive     TRIMETH/SULFA 160 RESISTANT Resistant     CLINDAMYCIN >=8 RESISTANT Resistant     RIFAMPIN <=0.5 SENSITIVE Sensitive     Inducible Clindamycin NEGATIVE Sensitive     * STAPHYLOCOCCUS EPIDERMIDIS  Blood Culture ID Panel (Reflexed)     Status: Abnormal   Collection Time: 10/04/20 12:50 PM  Result Value Ref Range Status   Enterococcus faecalis NOT DETECTED NOT DETECTED Final   Enterococcus Faecium NOT  DETECTED NOT DETECTED Final   Listeria monocytogenes NOT DETECTED NOT DETECTED Final   Staphylococcus species DETECTED (A) NOT DETECTED Final    Comment: CRITICAL RESULT CALLED TO, READ BACK BY AND VERIFIED WITH: A. Grandville Silos RN 16:20 10/05/20 (wilsonm)    Staphylococcus aureus (BCID) NOT DETECTED NOT DETECTED Final   Staphylococcus epidermidis DETECTED (A) NOT DETECTED  Final    Comment: Methicillin (oxacillin) resistant coagulase negative staphylococcus. Possible blood culture contaminant (unless isolated from more than one blood culture draw or clinical case suggests pathogenicity). No antibiotic treatment is indicated for blood  culture contaminants. CRITICAL RESULT CALLED TO, READ BACK BY AND VERIFIED WITH: A. Grandville Silos RN 16:20 10/05/20 (wilsonm)    Staphylococcus lugdunensis NOT DETECTED NOT DETECTED Final   Streptococcus species NOT DETECTED NOT DETECTED Final   Streptococcus agalactiae NOT DETECTED NOT DETECTED Final   Streptococcus pneumoniae NOT DETECTED NOT DETECTED Final   Streptococcus pyogenes NOT DETECTED NOT DETECTED Final   A.calcoaceticus-baumannii NOT DETECTED NOT DETECTED Final   Bacteroides fragilis NOT DETECTED NOT DETECTED Final   Enterobacterales NOT DETECTED NOT DETECTED Final   Enterobacter cloacae complex NOT DETECTED NOT DETECTED Final   Escherichia coli NOT DETECTED NOT DETECTED Final   Klebsiella aerogenes NOT DETECTED NOT DETECTED Final   Klebsiella oxytoca NOT DETECTED NOT DETECTED Final   Klebsiella pneumoniae NOT DETECTED NOT DETECTED Final   Proteus species NOT DETECTED NOT DETECTED Final   Salmonella species NOT DETECTED NOT DETECTED Final   Serratia marcescens NOT DETECTED NOT DETECTED Final   Haemophilus influenzae NOT DETECTED NOT DETECTED Final   Neisseria meningitidis NOT DETECTED NOT DETECTED Final   Pseudomonas aeruginosa NOT DETECTED NOT DETECTED Final   Stenotrophomonas maltophilia NOT DETECTED NOT DETECTED Final   Candida albicans NOT  DETECTED NOT DETECTED Final   Candida auris NOT DETECTED NOT DETECTED Final   Candida glabrata NOT DETECTED NOT DETECTED Final   Candida krusei NOT DETECTED NOT DETECTED Final   Candida parapsilosis NOT DETECTED NOT DETECTED Final   Candida tropicalis NOT DETECTED NOT DETECTED Final   Cryptococcus neoformans/gattii NOT DETECTED NOT DETECTED Final   Methicillin resistance mecA/C DETECTED (A) NOT DETECTED Final    Comment: CRITICAL RESULT CALLED TO, READ BACK BY AND VERIFIED WITH: A. Grandville Silos RN 16:20 10/05/20 (wilsonm) Performed at [facility], [place] [address], [place], [place] [address]   Culture, blood (Routine X 2) w Reflex to ID Panel     Status: Abnormal   Collection Time: 10/04/20 12:51 PM   Specimen: BLOOD RIGHT HAND  Result Value Ref Range Status   Specimen Description BLOOD RIGHT HAND  Final   Special Requests   Final    BOTTLES DRAWN AEROBIC AND ANAEROBIC Blood Culture adequate volume   Culture  Setup Time   Final    GRAM POSITIVE COCCI AEROBIC BOTTLE ONLY CRITICAL VALUE NOTED.  VALUE IS CONSISTENT WITH PREVIOUSLY REPORTED AND CALLED VALUE.    Culture (A)  Final    STAPHYLOCOCCUS EPIDERMIDIS SUSCEPTIBILITIES PERFORMED ON PREVIOUS CULTURE WITHIN THE LAST 5 DAYS. Performed at [facility], [place] [address], [place], [place] [address]    Report Status 10/07/2020 FINAL  Final  C Difficile Quick Screen (NO PCR Reflex)     Status: None   Collection Time: 10/04/20  5:19 PM   Specimen: STOOL  Result Value Ref Range Status   C Diff antigen NEGATIVE NEGATIVE Final   C Diff toxin NEGATIVE NEGATIVE Final   C Diff interpretation No C. difficile detected.  Final    Comment: Performed at [facility], [place] [address] [place], [place] [address]  Culture, respiratory (non-expectorated)     Status: None   Collection Time: 10/05/20 10:14 PM   Specimen: Tracheal Aspirate; Respiratory  Result Value Ref Range Status   Specimen Description TRACHEAL ASPIRATE  Final    Special Requests  NONE  Final   Gram Stain   Final    MODERATE WBC PRESENT, PREDOMINANTLY PMN MODERATE GRAM NEGATIVE RODS FEW YEAST RARE GRAM POSITIVE COCCI IN CHAINS Performed at [facility], [address], [place], [place] [address]    Culture MODERATE KLEBSIELLA PNEUMONIAE  Final   Report Status 10/08/2020 FINAL  Final   Organism ID, Bacteria KLEBSIELLA PNEUMONIAE  Final      Susceptibility   Klebsiella pneumoniae - MIC*    AMPICILLIN RESISTANT Resistant     CEFAZOLIN <=4 SENSITIVE Sensitive     CEFEPIME <=0.12 SENSITIVE Sensitive     CEFTAZIDIME <=1 SENSITIVE Sensitive     CEFTRIAXONE <=0.25 SENSITIVE Sensitive     CIPROFLOXACIN >=4 RESISTANT Resistant     GENTAMICIN <=1 SENSITIVE Sensitive     IMIPENEM <=0.25 SENSITIVE Sensitive     TRIMETH/SULFA <=20 SENSITIVE Sensitive     AMPICILLIN/SULBACTAM 8 SENSITIVE Sensitive     PIP/TAZO 16 SENSITIVE Sensitive     * MODERATE KLEBSIELLA PNEUMONIAE  Culture, blood (routine x 2)     Status: None   Collection Time: 10/08/20  4:17 PM   Specimen: BLOOD RIGHT WRIST  Result Value Ref Range Status   Specimen Description BLOOD RIGHT WRIST  Final   Special Requests   Final    BOTTLES DRAWN AEROBIC ONLY Blood Culture results may not be optimal due to an excessive volume of blood received in culture bottles   Culture   Final    NO GROWTH 5 DAYS Performed at [facility], [address], [place], [place] [address]    Report Status 10/13/2020 FINAL  Final  Culture, blood (routine x 2)     Status: None   Collection Time: 10/08/20  4:17 PM   Specimen: BLOOD RIGHT HAND  Result Value Ref Range Status   Specimen Description BLOOD RIGHT HAND  Final   Special Requests   Final    BOTTLES DRAWN AEROBIC ONLY Blood Culture results may not be optimal due to an excessive volume of blood received in culture bottles   Culture   Final    NO GROWTH 5 DAYS Performed at [facility], [address] [place], [place] [address]    Report  Status 10/13/2020 FINAL  Final  Culture, respiratory (non-expectorated)     Status: None   Collection Time: 10/14/20  1:41 PM   Specimen: Tracheal Aspirate; Respiratory  Result Value Ref Range Status   Specimen Description TRACHEAL ASPIRATE  Final   Special Requests NONE  Final   Gram Stain   Final    RARE WBC PRESENT, PREDOMINANTLY PMN RARE GRAM NEGATIVE RODS    Culture   Final    RARE Normal respiratory flora-no Staph aureus or Pseudomonas seen Performed at [facility], [address] [place], [place] [address]    Report Status 10/17/2020 FINAL  Final  Culture, blood (routine x 2)     Status: Abnormal   Collection Time: 10/27/20  9:24 PM   Specimen: BLOOD  Result Value Ref Range Status   Specimen Description BLOOD RIGHT HAND  Final   Special Requests   Final    BOTTLES DRAWN AEROBIC ONLY Blood Culture adequate volume   Culture  Setup Time   Final    Organism ID to follow YEAST AEROBIC BOTTLE ONLY CRITICAL RESULT CALLED TO, READ BACK BY AND VERIFIED WITH: RN D [place] [id number] AT 1400 BY CM    Culture (A)  Final  CANDIDA GLABRATA SEE SEPARATE REPORT FOR SUSCEPTIBILITY RESULTS Performed at [employer] Performed at [facility], [address], [place], [place] [address]    Report Status 11/13/2020 FINAL  Final  Culture, blood (routine x 2)     Status: None   Collection Time: 10/27/20  9:24 PM   Specimen: BLOOD  Result Value Ref Range Status   Specimen Description BLOOD RIGHT ARM  Final   Special Requests   Final    BOTTLES DRAWN AEROBIC ONLY Blood Culture adequate volume   Culture   Final    NO GROWTH 5 DAYS Performed at [facility], [address] [place], [place] [address]    Report Status 11/01/2020 FINAL  Final  Blood Culture ID Panel (Reflexed)     Status: Abnormal   Collection Time: 10/27/20  9:24 PM  Result Value Ref Range Status   Enterococcus faecalis NOT DETECTED NOT DETECTED Final   Enterococcus Faecium NOT DETECTED NOT  DETECTED Final   Listeria monocytogenes NOT DETECTED NOT DETECTED Final   Staphylococcus species NOT DETECTED NOT DETECTED Final   Staphylococcus aureus (BCID) NOT DETECTED NOT DETECTED Final   Staphylococcus epidermidis NOT DETECTED NOT DETECTED Final   Staphylococcus lugdunensis NOT DETECTED NOT DETECTED Final   Streptococcus species NOT DETECTED NOT DETECTED Final   Streptococcus agalactiae NOT DETECTED NOT DETECTED Final   Streptococcus pneumoniae NOT DETECTED NOT DETECTED Final   Streptococcus pyogenes NOT DETECTED NOT DETECTED Final   A.calcoaceticus-baumannii NOT DETECTED NOT DETECTED Final   Bacteroides fragilis NOT DETECTED NOT DETECTED Final   Enterobacterales NOT DETECTED NOT DETECTED Final   Enterobacter cloacae complex NOT DETECTED NOT DETECTED Final   Escherichia coli NOT DETECTED NOT DETECTED Final   Klebsiella aerogenes NOT DETECTED NOT DETECTED Final   Klebsiella oxytoca NOT DETECTED NOT DETECTED Final   Klebsiella pneumoniae NOT DETECTED NOT DETECTED Final   Proteus species NOT DETECTED NOT DETECTED Final   Salmonella species NOT DETECTED NOT DETECTED Final   Serratia marcescens NOT DETECTED NOT DETECTED Final   Haemophilus influenzae NOT DETECTED NOT DETECTED Final   Neisseria meningitidis NOT DETECTED NOT DETECTED Final   Pseudomonas aeruginosa NOT DETECTED NOT DETECTED Final   Stenotrophomonas maltophilia NOT DETECTED NOT DETECTED Final   Candida albicans NOT DETECTED NOT DETECTED Final   Candida auris NOT DETECTED NOT DETECTED Final   Candida glabrata DETECTED (A) NOT DETECTED Final    Comment: CRITICAL RESULT CALLED TO, READ BACK BY AND VERIFIED WITH: PHARMD M MITCHELL [id number] AT 1350 BY CM    Candida krusei NOT DETECTED NOT DETECTED Final   Candida parapsilosis NOT DETECTED NOT DETECTED Final   Candida tropicalis NOT DETECTED NOT DETECTED Final   Cryptococcus neoformans/gattii NOT DETECTED NOT DETECTED Final    Comment: Performed at [facility],  [address], [place], [place] [address]  Gram stain     Status: None   Collection Time: 10/31/20  3:16 PM   Specimen: Abdomen; Peritoneal Fluid  Result Value Ref Range Status   Specimen Description FLUID PERITONEAL ABDOMEN  Final   Special Requests NONE  Final   Gram Stain   Final    RARE WBC PRESENT,BOTH PMN AND MONONUCLEAR NO ORGANISMS SEEN Performed at [facility], [address], [place], [place] [address]    Report Status 10/31/2020 FINAL  Final  Culture, body fluid-bottle     Status: None   Collection Time: 10/31/20  3:16 PM   Specimen: Fluid  Result Value Ref Range Status   Specimen Description FLUID PERITONEAL ABDOMEN  Final   Special Requests BOTTLES DRAWN AEROBIC AND ANAEROBIC  Final   Culture   Final    NO GROWTH 5 DAYS Performed at [facility], [address] [place], [place] [address]    Report Status 11/05/2020 FINAL  Final  Culture, blood (routine x 2)     Status: None   Collection Time: 11/03/20  3:32 PM   Specimen: BLOOD  Result Value Ref Range Status   Specimen Description BLOOD LEFT ANTECUBITAL  Final   Special Requests   Final    BOTTLES DRAWN AEROBIC AND ANAEROBIC Blood Culture results may not be optimal due to an inadequate volume of blood received in culture bottles   Culture   Final    NO GROWTH 5 DAYS Performed at [facility], [address] [place], [place] [address]    Report Status 11/08/2020 FINAL  Final  Culture, blood (routine x 2)     Status: None   Collection Time: 11/03/20  3:45 PM   Specimen: BLOOD  Result Value Ref Range Status   Specimen Description BLOOD LEFT ANTECUBITAL  Final   Special Requests   Final    BOTTLES DRAWN AEROBIC ONLY Blood Culture adequate volume   Culture   Final    NO GROWTH 5 DAYS Performed at [facility], [address] [place], [place] [address]    Report Status 11/08/2020 FINAL  Final  Culture, respiratory (non-expectorated)     Status: None   Collection Time: 11/03/20  4:44 PM    Specimen: Tracheal Aspirate; Respiratory  Result Value Ref Range Status   Specimen Description TRACHEAL ASPIRATE  Final   Special Requests NONE  Final   Gram Stain   Final    FEW WBC PRESENT, PREDOMINANTLY PMN ABUNDANT GRAM NEGATIVE RODS MODERATE GRAM POSITIVE RODS FEW GRAM POSITIVE COCCI FEW YEAST Performed at [facility], [address], [place], [place] [address]    Culture ABUNDANT KLEBSIELLA PNEUMONIAE  Final   Report Status 11/05/2020 FINAL  Final   Organism ID, Bacteria KLEBSIELLA PNEUMONIAE  Final      Susceptibility   Klebsiella pneumoniae - MIC*    AMPICILLIN RESISTANT Resistant     CEFAZOLIN <=4 SENSITIVE Sensitive     CEFEPIME <=0.12 SENSITIVE Sensitive     CEFTAZIDIME <=1 SENSITIVE Sensitive     CEFTRIAXONE <=0.25 SENSITIVE Sensitive     CIPROFLOXACIN 1 SENSITIVE Sensitive     GENTAMICIN <=1 SENSITIVE Sensitive     IMIPENEM <=0.25 SENSITIVE Sensitive     TRIMETH/SULFA <=20 SENSITIVE Sensitive     AMPICILLIN/SULBACTAM 4 SENSITIVE Sensitive     PIP/TAZO <=4 SENSITIVE Sensitive     * ABUNDANT KLEBSIELLA PNEUMONIAE  Expectorated sputum assessment w rflx to resp cult     Status: None   Collection Time: 11/13/20  4:06 PM   Specimen: Expectorated Sputum  Result Value Ref Range Status   Specimen Description EXPECTORATED SPUTUM  Final   Special Requests NONE  Final   Sputum evaluation   Final    THIS SPECIMEN IS ACCEPTABLE FOR SPUTUM CULTURE Performed at [facility], [address], [place], [place] [address]    Report Status 11/14/2020 FINAL  Final  Culture, respiratory     Status: None   Collection Time: 11/13/20  4:06 PM  Result Value Ref Range Status   Specimen Description EXPECTORATED SPUTUM  Final  Special Requests NONE Reflexed from [id number]  Final   Gram Stain   Final    NO WBC SEEN FEW GRAM NEGATIVE RODS Performed at [facility], [place] [address], [place], [place] [address]    Culture MODERATE STENOTROPHOMONAS MALTOPHILIA  Final    Report Status 11/17/2020 FINAL  Final   Organism ID, Bacteria STENOTROPHOMONAS MALTOPHILIA  Final      Susceptibility   Stenotrophomonas maltophilia - MIC*    LEVOFLOXACIN 2 SENSITIVE Sensitive     TRIMETH/SULFA <=20 SENSITIVE Sensitive     * MODERATE STENOTROPHOMONAS MALTOPHILIA    Coagulation Studies: No results for input(s): LABPROT, INR in the last 72 hours.  Urinalysis: No results for input(s): COLORURINE, LABSPEC, PHURINE, GLUCOSEU, HGBUR, BILIRUBINUR, KETONESUR, PROTEINUR, UROBILINOGEN, NITRITE, LEUKOCYTESUR in the last 72 hours.  Invalid input(s): APPERANCEUR    Imaging: No results found.   Medications:     iohexol, lidocaine  Assessment/ Plan:  54 y.o. 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   1.  ESRD on HD.  Patient will receive hemodialysis treatment later this AM.  He will dialyze sitting up in a chair.  Patient to receive pain medications prior to this.  2.  Hypotension.  Patient with stable blood pressures recently.  Albumin available for blood pressure support during dialysis treatments.  3.  Anemia of chronic kidney disease.   Lab Results  Component Value Date   HGB 7.5 (L) 11/22/2020  Continue to monitor hemoglobin.  No need for blood transfusion currently.  4.  Secondary hyperparathyroidism.  Phosphorus to be repeated today.  5.  Acute respiratory failure.  Respiratory status stable status post extubation and decannulation of tracheostomy.    LOS: 0 Jeremy Yu 12/3/20217:50 AM

## 2020-11-25 LAB — PTH, INTACT AND CALCIUM
Calcium, Total (PTH): 12.2 mg/dL — ABNORMAL HIGH (ref 8.7–10.2)
PTH: 34 pg/mL (ref 15–65)

## 2020-11-27 LAB — RENAL FUNCTION PANEL
Albumin: 2.7 g/dL — ABNORMAL LOW (ref 3.5–5.0)
Anion gap: 16 — ABNORMAL HIGH (ref 5–15)
BUN: 95 mg/dL — ABNORMAL HIGH (ref 6–20)
CO2: 25 mmol/L (ref 22–32)
Calcium: 14.2 mg/dL (ref 8.9–10.3)
Chloride: 91 mmol/L — ABNORMAL LOW (ref 98–111)
Creatinine, Ser: 5.04 mg/dL — ABNORMAL HIGH (ref 0.61–1.24)
GFR, Estimated: 13 mL/min — ABNORMAL LOW (ref >60)
Glucose, Bld: 134 mg/dL — ABNORMAL HIGH (ref 70–99)
Phosphorus: 5.4 mg/dL — ABNORMAL HIGH (ref 2.5–4.6)
Potassium: 3.9 mmol/L (ref 3.5–5.1)
Sodium: 132 mmol/L — ABNORMAL LOW (ref 135–145)

## 2020-11-27 LAB — CBC
HCT: 27.4 % — ABNORMAL LOW (ref 39.0–52.0)
Hemoglobin: 7.9 g/dL — ABNORMAL LOW (ref 13.0–17.0)
MCH: 27.3 pg (ref 26.0–34.0)
MCHC: 28.8 g/dL — ABNORMAL LOW (ref 30.0–36.0)
MCV: 94.8 fL (ref 80.0–100.0)
Platelets: 887 10*3/uL — ABNORMAL HIGH (ref 150–400)
RBC: 2.89 MIL/uL — ABNORMAL LOW (ref 4.22–5.81)
RDW: 20.8 % — ABNORMAL HIGH (ref 11.5–15.5)
WBC: 14.7 10*3/uL — ABNORMAL HIGH (ref 4.0–10.5)
nRBC: 0.4 % — ABNORMAL HIGH (ref 0.0–0.2)

## 2020-11-27 LAB — MAGNESIUM: Magnesium: 2.4 mg/dL (ref 1.7–2.4)

## 2020-11-27 NOTE — Progress Notes (Signed)
[facility]  ROUNDING NOTE   Subjective:  Patient seen and evaluated during dialysis treatment today. Tolerating well. Ultrafiltration target 1.5 kg.   Objective:  Vital signs in last 24 hours:  Temperature 95.5 pulse 96 respirations 22 blood pressure 135/75   Physical Exam: General:  Chronically ill-appearing  Head:  Normocephalic, atraumatic. Moist oral mucosal membranes  Eyes:  Anicteric  Neck:  Supple  Lungs:   Scattered rhonchi, normal effort  Heart:  S1S2 no rubs  Abdomen:   Soft, nontender, bowel sounds present, PEG in place, colostomy  Extremities:  Right AKA, no left lower extremity edema  Neurologic:  Awake, alert, follows commands  Skin:  No acute rash  Access:  Left upper extremity AV fistula    Basic Metabolic Panel: Recent Labs  Lab 11/22/20 0424 11/24/20 0500 11/24/20 1239 11/27/20 0638  NA 134* 134*  --   --   K 3.5 3.7  --   --   CL 93* 94*  --   --   CO2 26 25  --   --   GLUCOSE 80 143*  --   --   BUN 60* 55*  --   --   CREATININE 3.87* 4.03*  --   --   CALCIUM 12.6* 13.1* 12.2*  --   MG 2.3 2.3  --  2.4  PHOS 4.4 4.7*  --   --     Liver Function Tests: Recent Labs  Lab 11/22/20 0424 11/24/20 0500  ALBUMIN 2.4* 2.7*   No results for input(s): LIPASE, AMYLASE in the last 168 hours. No results for input(s): AMMONIA in the last 168 hours.  CBC: Recent Labs  Lab 11/22/20 0424 11/24/20 1239 11/27/20 0638  WBC 15.3* 13.1* 14.7*  HGB 7.5* 7.5* 7.9*  HCT 24.8* 26.1* 27.4*  MCV 93.9 96.0 94.8  PLT 857* 891* 887*    Cardiac Enzymes: No results for input(s): CKTOTAL, CKMB, CKMBINDEX, TROPONINI in the last 168 hours.  BNP: Invalid input(s): POCBNP  CBG: No results for input(s): GLUCAP in the last 168 hours.  Microbiology: Results for orders placed or performed during the hospital encounter of 09/22/20  Culture, respiratory (non-expectorated)     Status: None   Collection Time: 09/23/20 11:25 AM   Specimen: Tracheal  Aspirate; Respiratory  Result Value Ref Range Status   Specimen Description TRACHEAL ASPIRATE  Final   Special Requests NONE  Final   Gram Stain   Final    RARE WBC PRESENT,BOTH PMN AND MONONUCLEAR NO ORGANISMS SEEN Performed at [facility], [address], [place], [place] [address]    Culture FEW KLEBSIELLA PNEUMONIAE  Final   Report Status 09/25/2020 FINAL  Final   Organism ID, Bacteria KLEBSIELLA PNEUMONIAE  Final      Susceptibility   Klebsiella pneumoniae - MIC*    AMPICILLIN >=32 RESISTANT Resistant     CEFAZOLIN <=4 SENSITIVE Sensitive     CEFEPIME 0.25 SENSITIVE Sensitive     CEFTAZIDIME <=1 SENSITIVE Sensitive     CEFTRIAXONE 1 SENSITIVE Sensitive     CIPROFLOXACIN <=0.25 SENSITIVE Sensitive     GENTAMICIN <=1 SENSITIVE Sensitive     IMIPENEM <=0.25 SENSITIVE Sensitive     TRIMETH/SULFA <=20 SENSITIVE Sensitive     AMPICILLIN/SULBACTAM 16 INTERMEDIATE Intermediate     PIP/TAZO 64 INTERMEDIATE Intermediate     * FEW KLEBSIELLA PNEUMONIAE  Culture, blood (routine x 2)     Status: None   Collection Time: 09/23/20  4:26 PM  Specimen: BLOOD  Result Value Ref Range Status   Specimen Description BLOOD BLOOD RIGHT HAND  Final   Special Requests   Final    AEROBIC BOTTLE ONLY Blood Culture results may not be optimal due to an inadequate volume of blood received in culture bottles   Culture   Final    NO GROWTH 5 DAYS Performed at [facility], [place] [address] [place], [place] [address]    Report Status 09/28/2020 FINAL  Final  Culture, blood (single)     Status: None   Collection Time: 09/24/20  6:00 AM   Specimen: BLOOD RIGHT HAND  Result Value Ref Range Status   Specimen Description BLOOD RIGHT HAND  Final   Special Requests   Final    BOTTLES DRAWN AEROBIC ONLY Blood Culture results may not be optimal due to an inadequate volume of blood received in culture bottles   Culture   Final    NO GROWTH 5 DAYS Performed at [facility], [place] [address] [place], [place] [address]    Report Status 09/29/2020 FINAL  Final  Body fluid culture     Status: None   Collection Time: 09/29/20 12:27 PM   Specimen: PATH Cytology Peritoneal fluid  Result Value Ref Range Status   Specimen Description PERITONEAL FLUID  Final   Special Requests NONE  Final   Gram Stain   Final    WBC PRESENT,BOTH PMN AND MONONUCLEAR NO ORGANISMS SEEN CYTOSPIN SMEAR    Culture   Final    NO GROWTH 3 DAYS Performed at [facility], [address], [place], [place] [address]    Report Status 10/03/2020 FINAL  Final  Culture, blood (Routine X 2) w Reflex to ID Panel     Status: Abnormal   Collection Time: 10/04/20 12:50 PM   Specimen: BLOOD  Result Value Ref Range Status   Specimen Description BLOOD RIGHT ANTECUBITAL  Final   Special Requests   Final    BOTTLES DRAWN AEROBIC AND ANAEROBIC Blood Culture adequate volume   Culture  Setup Time   Final    GRAM POSITIVE COCCI IN CLUSTERS AEROBIC BOTTLE ONLY CRITICAL RESULT CALLED TO, READ BACK BY AND VERIFIED WITH: A. [place] Silos RN 16:20 10/05/20 ([email or web address]) Performed at [facility], [place] [address], [place], [place] [address]    Culture STAPHYLOCOCCUS EPIDERMIDIS (A)  Final   Report Status 10/07/2020 FINAL  Final   Organism ID, Bacteria STAPHYLOCOCCUS EPIDERMIDIS  Final      Susceptibility   Staphylococcus epidermidis - MIC*    CIPROFLOXACIN >=8 RESISTANT Resistant     ERYTHROMYCIN >=8 RESISTANT Resistant     GENTAMICIN >=16 RESISTANT Resistant     OXACILLIN >=4 RESISTANT Resistant     TETRACYCLINE 2 SENSITIVE Sensitive     VANCOMYCIN 2 SENSITIVE Sensitive     TRIMETH/SULFA 160 RESISTANT Resistant     CLINDAMYCIN >=8 RESISTANT Resistant     RIFAMPIN <=0.5 SENSITIVE Sensitive     Inducible Clindamycin NEGATIVE Sensitive     * STAPHYLOCOCCUS EPIDERMIDIS  Blood Culture ID Panel (Reflexed)     Status: Abnormal   Collection Time: 10/04/20 12:50 PM  Result Value Ref Range Status   Enterococcus faecalis  NOT DETECTED NOT DETECTED Final   Enterococcus Faecium NOT DETECTED NOT DETECTED Final   Listeria monocytogenes NOT DETECTED NOT DETECTED Final   Staphylococcus species DETECTED (A) NOT DETECTED Final    Comment: CRITICAL RESULT CALLED TO, READ BACK BY  AND VERIFIED WITH: A. [place] Silos RN 16:20 10/05/20 (wilsonm)    Staphylococcus aureus (BCID) NOT DETECTED NOT DETECTED Final   Staphylococcus epidermidis DETECTED (A) NOT DETECTED Final    Comment: Methicillin (oxacillin) resistant coagulase negative staphylococcus. Possible blood culture contaminant (unless isolated from more than one blood culture draw or clinical case suggests pathogenicity). No antibiotic treatment is indicated for blood  culture contaminants. CRITICAL RESULT CALLED TO, READ BACK BY AND VERIFIED WITH: A. [place] Silos RN 16:20 10/05/20 (wilsonm)    Staphylococcus lugdunensis NOT DETECTED NOT DETECTED Final   Streptococcus species NOT DETECTED NOT DETECTED Final   Streptococcus agalactiae NOT DETECTED NOT DETECTED Final   Streptococcus pneumoniae NOT DETECTED NOT DETECTED Final   Streptococcus pyogenes NOT DETECTED NOT DETECTED Final   A.calcoaceticus-baumannii NOT DETECTED NOT DETECTED Final   Bacteroides fragilis NOT DETECTED NOT DETECTED Final   Enterobacterales NOT DETECTED NOT DETECTED Final   Enterobacter cloacae complex NOT DETECTED NOT DETECTED Final   Escherichia coli NOT DETECTED NOT DETECTED Final   Klebsiella aerogenes NOT DETECTED NOT DETECTED Final   Klebsiella oxytoca NOT DETECTED NOT DETECTED Final   Klebsiella pneumoniae NOT DETECTED NOT DETECTED Final   Proteus species NOT DETECTED NOT DETECTED Final   Salmonella species NOT DETECTED NOT DETECTED Final   Serratia marcescens NOT DETECTED NOT DETECTED Final   Haemophilus influenzae NOT DETECTED NOT DETECTED Final   Neisseria meningitidis NOT DETECTED NOT DETECTED Final   Pseudomonas aeruginosa NOT DETECTED NOT DETECTED Final   Stenotrophomonas maltophilia  NOT DETECTED NOT DETECTED Final   Candida albicans NOT DETECTED NOT DETECTED Final   Candida auris NOT DETECTED NOT DETECTED Final   Candida glabrata NOT DETECTED NOT DETECTED Final   Candida krusei NOT DETECTED NOT DETECTED Final   Candida parapsilosis NOT DETECTED NOT DETECTED Final   Candida tropicalis NOT DETECTED NOT DETECTED Final   Cryptococcus neoformans/gattii NOT DETECTED NOT DETECTED Final   Methicillin resistance mecA/C DETECTED (A) NOT DETECTED Final    Comment: CRITICAL RESULT CALLED TO, READ BACK BY AND VERIFIED WITH: A. [place] Silos RN 16:20 10/05/20 ([email or web address]) Performed at [facility], [place] [address], [place], [place] [address]   Culture, blood (Routine X 2) w Reflex to ID Panel     Status: Abnormal   Collection Time: 10/04/20 12:51 PM   Specimen: BLOOD RIGHT HAND  Result Value Ref Range Status   Specimen Description BLOOD RIGHT HAND  Final   Special Requests   Final    BOTTLES DRAWN AEROBIC AND ANAEROBIC Blood Culture adequate volume   Culture  Setup Time   Final    GRAM POSITIVE COCCI AEROBIC BOTTLE ONLY CRITICAL VALUE NOTED.  VALUE IS CONSISTENT WITH PREVIOUSLY REPORTED AND CALLED VALUE.    Culture (A)  Final    STAPHYLOCOCCUS EPIDERMIDIS SUSCEPTIBILITIES PERFORMED ON PREVIOUS CULTURE WITHIN THE LAST 5 DAYS. Performed at [facility], [place] [address], [place], [place] [address]    Report Status 10/07/2020 FINAL  Final  C Difficile Quick Screen (NO PCR Reflex)     Status: None   Collection Time: 10/04/20  5:19 PM   Specimen: STOOL  Result Value Ref Range Status   C Diff antigen NEGATIVE NEGATIVE Final   C Diff toxin NEGATIVE NEGATIVE Final   C Diff interpretation No C. difficile detected.  Final    Comment: Performed at [facility], [place] [address] [place],  [address]  Culture, respiratory (non-expectorated)     Status: None   Collection Time:  10/05/20 10:14 PM   Specimen: Tracheal Aspirate; Respiratory  Result Value Ref Range Status    Specimen Description TRACHEAL ASPIRATE  Final   Special Requests NONE  Final   Gram Stain   Final    MODERATE WBC PRESENT, PREDOMINANTLY PMN MODERATE GRAM NEGATIVE RODS FEW YEAST RARE GRAM POSITIVE COCCI IN CHAINS Performed at [facility], [address], [place], [place] [address]    Culture MODERATE KLEBSIELLA PNEUMONIAE  Final   Report Status 10/08/2020 FINAL  Final   Organism ID, Bacteria KLEBSIELLA PNEUMONIAE  Final      Susceptibility   Klebsiella pneumoniae - MIC*    AMPICILLIN RESISTANT Resistant     CEFAZOLIN <=4 SENSITIVE Sensitive     CEFEPIME <=0.12 SENSITIVE Sensitive     CEFTAZIDIME <=1 SENSITIVE Sensitive     CEFTRIAXONE <=0.25 SENSITIVE Sensitive     CIPROFLOXACIN >=4 RESISTANT Resistant     GENTAMICIN <=1 SENSITIVE Sensitive     IMIPENEM <=0.25 SENSITIVE Sensitive     TRIMETH/SULFA <=20 SENSITIVE Sensitive     AMPICILLIN/SULBACTAM 8 SENSITIVE Sensitive     PIP/TAZO 16 SENSITIVE Sensitive     * MODERATE KLEBSIELLA PNEUMONIAE  Culture, blood (routine x 2)     Status: None   Collection Time: 10/08/20  4:17 PM   Specimen: BLOOD RIGHT WRIST  Result Value Ref Range Status   Specimen Description BLOOD RIGHT WRIST  Final   Special Requests   Final    BOTTLES DRAWN AEROBIC ONLY Blood Culture results may not be optimal due to an excessive volume of blood received in culture bottles   Culture   Final    NO GROWTH 5 DAYS Performed at [facility], [place] [address] [place], [place] [address]    Report Status 10/13/2020 FINAL  Final  Culture, blood (routine x 2)     Status: None   Collection Time: 10/08/20  4:17 PM   Specimen: BLOOD RIGHT HAND  Result Value Ref Range Status   Specimen Description BLOOD RIGHT HAND  Final   Special Requests   Final    BOTTLES DRAWN AEROBIC ONLY Blood Culture results may not be optimal due to an excessive volume of blood received in culture bottles   Culture   Final    NO GROWTH 5 DAYS Performed at [facility], [place] [address] [place], [place] [address]    Report Status 10/13/2020 FINAL  Final  Culture, respiratory (non-expectorated)     Status: None   Collection Time: 10/14/20  1:41 PM   Specimen: Tracheal Aspirate; Respiratory  Result Value Ref Range Status   Specimen Description TRACHEAL ASPIRATE  Final   Special Requests NONE  Final   Gram Stain   Final    RARE WBC PRESENT, PREDOMINANTLY PMN RARE GRAM NEGATIVE RODS    Culture   Final    RARE Normal respiratory flora-no Staph aureus or Pseudomonas seen Performed at [facility], [address] [place], [place] [address]    Report Status 10/17/2020 FINAL  Final  Culture, blood (routine x 2)     Status: Abnormal   Collection Time: 10/27/20  9:24 PM   Specimen: BLOOD  Result Value Ref Range Status   Specimen Description BLOOD RIGHT HAND  Final   Special Requests   Final    BOTTLES DRAWN AEROBIC ONLY Blood Culture adequate volume   Culture  Setup Time   Final    Organism ID to follow YEAST AEROBIC  BOTTLE ONLY CRITICAL RESULT CALLED TO, READ BACK BY AND VERIFIED WITH: RN D [place] [id number] AT 1400 BY CM    Culture (A)  Final    CANDIDA GLABRATA SEE SEPARATE REPORT FOR SUSCEPTIBILITY RESULTS Performed at [employer] Performed at [facility], [address], [place], [place] [address]    Report Status 11/13/2020 FINAL  Final  Culture, blood (routine x 2)     Status: None   Collection Time: 10/27/20  9:24 PM   Specimen: BLOOD  Result Value Ref Range Status   Specimen Description BLOOD RIGHT ARM  Final   Special Requests   Final    BOTTLES DRAWN AEROBIC ONLY Blood Culture adequate volume   Culture   Final    NO GROWTH 5 DAYS Performed at [facility], [place] [address], [place], [place] [address]    Report Status 11/01/2020 FINAL  Final  Blood Culture ID Panel (Reflexed)     Status: Abnormal   Collection Time: 10/27/20  9:24 PM  Result Value Ref Range Status   Enterococcus faecalis NOT DETECTED NOT  DETECTED Final   Enterococcus Faecium NOT DETECTED NOT DETECTED Final   Listeria monocytogenes NOT DETECTED NOT DETECTED Final   Staphylococcus species NOT DETECTED NOT DETECTED Final   Staphylococcus aureus (BCID) NOT DETECTED NOT DETECTED Final   Staphylococcus epidermidis NOT DETECTED NOT DETECTED Final   Staphylococcus lugdunensis NOT DETECTED NOT DETECTED Final   Streptococcus species NOT DETECTED NOT DETECTED Final   Streptococcus agalactiae NOT DETECTED NOT DETECTED Final   Streptococcus pneumoniae NOT DETECTED NOT DETECTED Final   Streptococcus pyogenes NOT DETECTED NOT DETECTED Final   A.calcoaceticus-baumannii NOT DETECTED NOT DETECTED Final   Bacteroides fragilis NOT DETECTED NOT DETECTED Final   Enterobacterales NOT DETECTED NOT DETECTED Final   Enterobacter cloacae complex NOT DETECTED NOT DETECTED Final   Escherichia coli NOT DETECTED NOT DETECTED Final   Klebsiella aerogenes NOT DETECTED NOT DETECTED Final   Klebsiella oxytoca NOT DETECTED NOT DETECTED Final   Klebsiella pneumoniae NOT DETECTED NOT DETECTED Final   Proteus species NOT DETECTED NOT DETECTED Final   Salmonella species NOT DETECTED NOT DETECTED Final   Serratia marcescens NOT DETECTED NOT DETECTED Final   Haemophilus influenzae NOT DETECTED NOT DETECTED Final   Neisseria meningitidis NOT DETECTED NOT DETECTED Final   Pseudomonas aeruginosa NOT DETECTED NOT DETECTED Final   Stenotrophomonas maltophilia NOT DETECTED NOT DETECTED Final   Candida albicans NOT DETECTED NOT DETECTED Final   Candida auris NOT DETECTED NOT DETECTED Final   Candida glabrata DETECTED (A) NOT DETECTED Final    Comment: CRITICAL RESULT CALLED TO, READ BACK BY AND VERIFIED WITH: PHARMD M MITCHELL [id number] AT 1350 BY CM    Candida krusei NOT DETECTED NOT DETECTED Final   Candida parapsilosis NOT DETECTED NOT DETECTED Final   Candida tropicalis NOT DETECTED NOT DETECTED Final   Cryptococcus neoformans/gattii NOT DETECTED NOT DETECTED  Final    Comment: Performed at [facility], [address], [place], [place] [address]  Gram stain     Status: None   Collection Time: 10/31/20  3:16 PM   Specimen: Abdomen; Peritoneal Fluid  Result Value Ref Range Status   Specimen Description FLUID PERITONEAL ABDOMEN  Final   Special Requests NONE  Final   Gram Stain   Final    RARE WBC PRESENT,BOTH PMN AND MONONUCLEAR NO ORGANISMS SEEN Performed at [facility], [place] [address], [place], [place] [address]  Report Status 10/31/2020 FINAL  Final  Culture, body fluid-bottle     Status: None   Collection Time: 10/31/20  3:16 PM   Specimen: Fluid  Result Value Ref Range Status   Specimen Description FLUID PERITONEAL ABDOMEN  Final   Special Requests BOTTLES DRAWN AEROBIC AND ANAEROBIC  Final   Culture   Final    NO GROWTH 5 DAYS Performed at [facility], [address], [place], [place] [address]    Report Status 11/05/2020 FINAL  Final  Culture, blood (routine x 2)     Status: None   Collection Time: 11/03/20  3:32 PM   Specimen: BLOOD  Result Value Ref Range Status   Specimen Description BLOOD LEFT ANTECUBITAL  Final   Special Requests   Final    BOTTLES DRAWN AEROBIC AND ANAEROBIC Blood Culture results may not be optimal due to an inadequate volume of blood received in culture bottles   Culture   Final    NO GROWTH 5 DAYS Performed at [facility], [address] [place], [place] [address]    Report Status 11/08/2020 FINAL  Final  Culture, blood (routine x 2)     Status: None   Collection Time: 11/03/20  3:45 PM   Specimen: BLOOD  Result Value Ref Range Status   Specimen Description BLOOD LEFT ANTECUBITAL  Final   Special Requests   Final    BOTTLES DRAWN AEROBIC ONLY Blood Culture adequate volume   Culture   Final    NO GROWTH 5 DAYS Performed at [facility], [address] [place], [place] [address]    Report Status 11/08/2020 FINAL  Final  Culture, respiratory (non-expectorated)      Status: None   Collection Time: 11/03/20  4:44 PM   Specimen: Tracheal Aspirate; Respiratory  Result Value Ref Range Status   Specimen Description TRACHEAL ASPIRATE  Final   Special Requests NONE  Final   Gram Stain   Final    FEW WBC PRESENT, PREDOMINANTLY PMN ABUNDANT GRAM NEGATIVE RODS MODERATE GRAM POSITIVE RODS FEW GRAM POSITIVE COCCI FEW YEAST Performed at [facility], [address], [place], [place] [address]    Culture ABUNDANT KLEBSIELLA PNEUMONIAE  Final   Report Status 11/05/2020 FINAL  Final   Organism ID, Bacteria KLEBSIELLA PNEUMONIAE  Final      Susceptibility   Klebsiella pneumoniae - MIC*    AMPICILLIN RESISTANT Resistant     CEFAZOLIN <=4 SENSITIVE Sensitive     CEFEPIME <=0.12 SENSITIVE Sensitive     CEFTAZIDIME <=1 SENSITIVE Sensitive     CEFTRIAXONE <=0.25 SENSITIVE Sensitive     CIPROFLOXACIN 1 SENSITIVE Sensitive     GENTAMICIN <=1 SENSITIVE Sensitive     IMIPENEM <=0.25 SENSITIVE Sensitive     TRIMETH/SULFA <=20 SENSITIVE Sensitive     AMPICILLIN/SULBACTAM 4 SENSITIVE Sensitive     PIP/TAZO <=4 SENSITIVE Sensitive     * ABUNDANT KLEBSIELLA PNEUMONIAE  Expectorated sputum assessment w rflx to resp cult     Status: None   Collection Time: 11/13/20  4:06 PM   Specimen: Expectorated Sputum  Result Value Ref Range Status   Specimen Description EXPECTORATED SPUTUM  Final   Special Requests NONE  Final   Sputum evaluation   Final    THIS SPECIMEN IS ACCEPTABLE FOR SPUTUM CULTURE Performed at [facility], [address], [place], [place] [address]    Report Status 11/14/2020 FINAL  Final  Culture, respiratory  Status: None   Collection Time: 11/13/20  4:06 PM  Result Value Ref Range Status   Specimen Description EXPECTORATED SPUTUM  Final   Special Requests NONE Reflexed from [id number]  Final   Gram Stain   Final    NO WBC SEEN FEW GRAM NEGATIVE RODS Performed at [facility], [address] [place], [place] [address]     Culture MODERATE STENOTROPHOMONAS MALTOPHILIA  Final   Report Status 11/17/2020 FINAL  Final   Organism ID, Bacteria STENOTROPHOMONAS MALTOPHILIA  Final      Susceptibility   Stenotrophomonas maltophilia - MIC*    LEVOFLOXACIN 2 SENSITIVE Sensitive     TRIMETH/SULFA <=20 SENSITIVE Sensitive     * MODERATE STENOTROPHOMONAS MALTOPHILIA    Coagulation Studies: No results for input(s): LABPROT, INR in the last 72 hours.  Urinalysis: No results for input(s): COLORURINE, LABSPEC, PHURINE, GLUCOSEU, HGBUR, BILIRUBINUR, KETONESUR, PROTEINUR, UROBILINOGEN, NITRITE, LEUKOCYTESUR in the last 72 hours.  Invalid input(s): APPERANCEUR    Imaging: No results found.   Medications:     iohexol, lidocaine  Assessment/ Plan:  54 y.o. 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   1.  ESRD on HD.  Patient seen and evaluated during hemodialysis treatment today.  Tolerating well.  Ultrafiltration target 1.5 kg.  2.  Hypotension.  Albumin available for today's treatment in case of hypotension..  3.  Anemia of chronic kidney disease.   Lab Results  Component Value Date   HGB 7.9 (L) 11/27/2020  Continue to monitor hemoglobin.  Hemoglobin has trended up.  Hemoglobin currently 7.9.  No immediate need for transfusion.  4.  Secondary hyperparathyroidism.  Last serum phosphorus was acceptable at 4.7.  Continue to monitor.  5.  Acute respiratory failure.  Respiratory status stable status post extubation and decannulation of tracheostomy.    LOS: 0 Fleta Borgeson 12/6/20218:13 AM

## 2020-11-29 LAB — RENAL FUNCTION PANEL
Albumin: 3 g/dL — ABNORMAL LOW (ref 3.5–5.0)
Anion gap: 16 — ABNORMAL HIGH (ref 5–15)
BUN: 67 mg/dL — ABNORMAL HIGH (ref 6–20)
CO2: 23 mmol/L (ref 22–32)
Calcium: 13.8 mg/dL (ref 8.9–10.3)
Chloride: 92 mmol/L — ABNORMAL LOW (ref 98–111)
Creatinine, Ser: 4.25 mg/dL — ABNORMAL HIGH (ref 0.61–1.24)
GFR, Estimated: 16 mL/min — ABNORMAL LOW (ref >60)
Glucose, Bld: 133 mg/dL — ABNORMAL HIGH (ref 70–99)
Phosphorus: 6.4 mg/dL — ABNORMAL HIGH (ref 2.5–4.6)
Potassium: 4.2 mmol/L (ref 3.5–5.1)
Sodium: 131 mmol/L — ABNORMAL LOW (ref 135–145)

## 2020-11-29 LAB — CBC
HCT: 27.3 % — ABNORMAL LOW (ref 39.0–52.0)
Hemoglobin: 8.3 g/dL — ABNORMAL LOW (ref 13.0–17.0)
MCH: 28.5 pg (ref 26.0–34.0)
MCHC: 30.4 g/dL (ref 30.0–36.0)
MCV: 93.8 fL (ref 80.0–100.0)
Platelets: 849 10*3/uL — ABNORMAL HIGH (ref 150–400)
RBC: 2.91 MIL/uL — ABNORMAL LOW (ref 4.22–5.81)
RDW: 21 % — ABNORMAL HIGH (ref 11.5–15.5)
WBC: 14.1 10*3/uL — ABNORMAL HIGH (ref 4.0–10.5)
nRBC: 0.4 % — ABNORMAL HIGH (ref 0.0–0.2)

## 2020-11-29 LAB — MAGNESIUM: Magnesium: 2.4 mg/dL (ref 1.7–2.4)

## 2020-11-29 NOTE — Progress Notes (Signed)
[facility]  ROUNDING NOTE   Subjective:  Patient due for HD treatment today. A bit lethargic but arousable.  Ca down to 13.8.    Objective:  Vital signs in last 24 hours:  Temperature 96.4 pulse 96 respirations 17 blood pressure 127/77   Physical Exam: General:  Chronically ill-appearing  Head:  Normocephalic, atraumatic. Moist oral mucosal membranes  Eyes:  Anicteric  Neck:  Supple  Lungs:   Scattered rhonchi, normal effort  Heart:  S1S2 no rubs  Abdomen:   Soft, nontender, bowel sounds present, PEG in place, colostomy  Extremities:  Right AKA, no left lower extremity edema  Neurologic:  Awake, alert, follows commands  Skin:  No acute rash  Access:  Left upper extremity AV fistula    Basic Metabolic Panel: Recent Labs  Lab 11/24/20 0500 11/24/20 1239 11/27/20 0638 11/29/20 0714  NA 134*  --  132* 131*  K 3.7  --  3.9 4.2  CL 94*  --  91* 92*  CO2 25  --  25 23  GLUCOSE 143*  --  134* 133*  BUN 55*  --  95* 67*  CREATININE 4.03*  --  5.04* 4.25*  CALCIUM 13.1* 12.2* 14.2* 13.8*  MG 2.3  --  2.4 2.4  PHOS 4.7*  --  5.4* 6.4*    Liver Function Tests: Recent Labs  Lab 11/24/20 0500 11/27/20 0638 11/29/20 0714  ALBUMIN 2.7* 2.7* 3.0*   No results for input(s): LIPASE, AMYLASE in the last 168 hours. No results for input(s): AMMONIA in the last 168 hours.  CBC: Recent Labs  Lab 11/24/20 1239 11/27/20 0638 11/29/20 0714  WBC 13.1* 14.7* 14.1*  HGB 7.5* 7.9* 8.3*  HCT 26.1* 27.4* 27.3*  MCV 96.0 94.8 93.8  PLT 891* 887* 849*    Cardiac Enzymes: No results for input(s): CKTOTAL, CKMB, CKMBINDEX, TROPONINI in the last 168 hours.  BNP: Invalid input(s): POCBNP  CBG: No results for input(s): GLUCAP in the last 168 hours.  Microbiology: Results for orders placed or performed during the hospital encounter of 09/22/20  Culture, respiratory (non-expectorated)     Status: None   Collection Time: 09/23/20 11:25 AM   Specimen: Tracheal  Aspirate; Respiratory  Result Value Ref Range Status   Specimen Description TRACHEAL ASPIRATE  Final   Special Requests NONE  Final   Gram Stain   Final    RARE WBC PRESENT,BOTH PMN AND MONONUCLEAR NO ORGANISMS SEEN Performed at [facility], [address] [place], [place] [address]    Culture FEW KLEBSIELLA PNEUMONIAE  Final   Report Status 09/25/2020 FINAL  Final   Organism ID, Bacteria KLEBSIELLA PNEUMONIAE  Final      Susceptibility   Klebsiella pneumoniae - MIC*    AMPICILLIN >=32 RESISTANT Resistant     CEFAZOLIN <=4 SENSITIVE Sensitive     CEFEPIME 0.25 SENSITIVE Sensitive     CEFTAZIDIME <=1 SENSITIVE Sensitive     CEFTRIAXONE 1 SENSITIVE Sensitive     CIPROFLOXACIN <=0.25 SENSITIVE Sensitive     GENTAMICIN <=1 SENSITIVE Sensitive     IMIPENEM <=0.25 SENSITIVE Sensitive     TRIMETH/SULFA <=20 SENSITIVE Sensitive     AMPICILLIN/SULBACTAM 16 INTERMEDIATE Intermediate     PIP/TAZO 64 INTERMEDIATE Intermediate     * FEW KLEBSIELLA PNEUMONIAE  Culture, blood (routine x 2)     Status: None   Collection Time: 09/23/20  4:26 PM   Specimen: BLOOD  Result Value Ref Range Status   Specimen Description  BLOOD BLOOD RIGHT HAND  Final   Special Requests   Final    AEROBIC BOTTLE ONLY Blood Culture results may not be optimal due to an inadequate volume of blood received in culture bottles   Culture   Final    NO GROWTH 5 DAYS Performed at [facility], [place] [address] [place], [place] [address]    Report Status 09/28/2020 FINAL  Final  Culture, blood (single)     Status: None   Collection Time: 09/24/20  6:00 AM   Specimen: BLOOD RIGHT HAND  Result Value Ref Range Status   Specimen Description BLOOD RIGHT HAND  Final   Special Requests   Final    BOTTLES DRAWN AEROBIC ONLY Blood Culture results may not be optimal due to an inadequate volume of blood received in culture bottles   Culture   Final    NO GROWTH 5 DAYS Performed at [facility], [place] [address] [place], [place] [address]    Report Status 09/29/2020 FINAL  Final  Body fluid culture     Status: None   Collection Time: 09/29/20 12:27 PM   Specimen: PATH Cytology Peritoneal fluid  Result Value Ref Range Status   Specimen Description PERITONEAL FLUID  Final   Special Requests NONE  Final   Gram Stain   Final    WBC PRESENT,BOTH PMN AND MONONUCLEAR NO ORGANISMS SEEN CYTOSPIN SMEAR    Culture   Final    NO GROWTH 3 DAYS Performed at [facility], [address], [place],  [address]    Report Status 10/03/2020 FINAL  Final  Culture, blood (Routine X 2) w Reflex to ID Panel     Status: Abnormal   Collection Time: 10/04/20 12:50 PM   Specimen: BLOOD  Result Value Ref Range Status   Specimen Description BLOOD RIGHT ANTECUBITAL  Final   Special Requests   Final    BOTTLES DRAWN AEROBIC AND ANAEROBIC Blood Culture adequate volume   Culture  Setup Time   Final    GRAM POSITIVE COCCI IN CLUSTERS AEROBIC BOTTLE ONLY CRITICAL RESULT CALLED TO, READ BACK BY AND VERIFIED WITH: A. [place] Silos RN 16:20 10/05/20 ([email or web address]) Performed at [facility], [place] [address] [place], [place] [address]    Culture STAPHYLOCOCCUS EPIDERMIDIS (A)  Final   Report Status 10/07/2020 FINAL  Final   Organism ID, Bacteria STAPHYLOCOCCUS EPIDERMIDIS  Final      Susceptibility   Staphylococcus epidermidis - MIC*    CIPROFLOXACIN >=8 RESISTANT Resistant     ERYTHROMYCIN >=8 RESISTANT Resistant     GENTAMICIN >=16 RESISTANT Resistant     OXACILLIN >=4 RESISTANT Resistant     TETRACYCLINE 2 SENSITIVE Sensitive     VANCOMYCIN 2 SENSITIVE Sensitive     TRIMETH/SULFA 160 RESISTANT Resistant     CLINDAMYCIN >=8 RESISTANT Resistant     RIFAMPIN <=0.5 SENSITIVE Sensitive     Inducible Clindamycin NEGATIVE Sensitive     * STAPHYLOCOCCUS EPIDERMIDIS  Blood Culture ID Panel (Reflexed)     Status: Abnormal   Collection Time: 10/04/20 12:50 PM  Result Value Ref Range Status   Enterococcus faecalis  NOT DETECTED NOT DETECTED Final   Enterococcus Faecium NOT DETECTED NOT DETECTED Final   Listeria monocytogenes NOT DETECTED NOT DETECTED Final   Staphylococcus species DETECTED (A) NOT DETECTED Final    Comment: CRITICAL RESULT CALLED TO, READ BACK BY AND VERIFIED WITH: A. [place] Silos RN 16:20 10/05/20 (wilsonm)  Staphylococcus aureus (BCID) NOT DETECTED NOT DETECTED Final   Staphylococcus epidermidis DETECTED (A) NOT DETECTED Final    Comment: Methicillin (oxacillin) resistant coagulase negative staphylococcus. Possible blood culture contaminant (unless isolated from more than one blood culture draw or clinical case suggests pathogenicity). No antibiotic treatment is indicated for blood  culture contaminants. CRITICAL RESULT CALLED TO, READ BACK BY AND VERIFIED WITH: A. [place] Silos RN 16:20 10/05/20 (wilsonm)    Staphylococcus lugdunensis NOT DETECTED NOT DETECTED Final   Streptococcus species NOT DETECTED NOT DETECTED Final   Streptococcus agalactiae NOT DETECTED NOT DETECTED Final   Streptococcus pneumoniae NOT DETECTED NOT DETECTED Final   Streptococcus pyogenes NOT DETECTED NOT DETECTED Final   A.calcoaceticus-baumannii NOT DETECTED NOT DETECTED Final   Bacteroides fragilis NOT DETECTED NOT DETECTED Final   Enterobacterales NOT DETECTED NOT DETECTED Final   Enterobacter cloacae complex NOT DETECTED NOT DETECTED Final   Escherichia coli NOT DETECTED NOT DETECTED Final   Klebsiella aerogenes NOT DETECTED NOT DETECTED Final   Klebsiella oxytoca NOT DETECTED NOT DETECTED Final   Klebsiella pneumoniae NOT DETECTED NOT DETECTED Final   Proteus species NOT DETECTED NOT DETECTED Final   Salmonella species NOT DETECTED NOT DETECTED Final   Serratia marcescens NOT DETECTED NOT DETECTED Final   Haemophilus influenzae NOT DETECTED NOT DETECTED Final   Neisseria meningitidis NOT DETECTED NOT DETECTED Final   Pseudomonas aeruginosa NOT DETECTED NOT DETECTED Final   Stenotrophomonas maltophilia  NOT DETECTED NOT DETECTED Final   Candida albicans NOT DETECTED NOT DETECTED Final   Candida auris NOT DETECTED NOT DETECTED Final   Candida glabrata NOT DETECTED NOT DETECTED Final   Candida krusei NOT DETECTED NOT DETECTED Final   Candida parapsilosis NOT DETECTED NOT DETECTED Final   Candida tropicalis NOT DETECTED NOT DETECTED Final   Cryptococcus neoformans/gattii NOT DETECTED NOT DETECTED Final   Methicillin resistance mecA/C DETECTED (A) NOT DETECTED Final    Comment: CRITICAL RESULT CALLED TO, READ BACK BY AND VERIFIED WITH: A. [place] Silos RN 16:20 10/05/20 (wilsonm) Performed at [facility], [place] [address], [place], [place] [address]   Culture, blood (Routine X 2) w Reflex to ID Panel     Status: Abnormal   Collection Time: 10/04/20 12:51 PM   Specimen: BLOOD RIGHT HAND  Result Value Ref Range Status   Specimen Description BLOOD RIGHT HAND  Final   Special Requests   Final    BOTTLES DRAWN AEROBIC AND ANAEROBIC Blood Culture adequate volume   Culture  Setup Time   Final    GRAM POSITIVE COCCI AEROBIC BOTTLE ONLY CRITICAL VALUE NOTED.  VALUE IS CONSISTENT WITH PREVIOUSLY REPORTED AND CALLED VALUE.    Culture (A)  Final    STAPHYLOCOCCUS EPIDERMIDIS SUSCEPTIBILITIES PERFORMED ON PREVIOUS CULTURE WITHIN THE LAST 5 DAYS. Performed at [facility], [place] [address], [place], [place] [address]    Report Status 10/07/2020 FINAL  Final  C Difficile Quick Screen (NO PCR Reflex)     Status: None   Collection Time: 10/04/20  5:19 PM   Specimen: STOOL  Result Value Ref Range Status   C Diff antigen NEGATIVE NEGATIVE Final   C Diff toxin NEGATIVE NEGATIVE Final   C Diff interpretation No C. difficile detected.  Final    Comment: Performed at [facility], [place] [address] [place], [place] [address]  Culture, respiratory (non-expectorated)     Status: None   Collection Time: 10/05/20 10:14 PM   Specimen: Tracheal Aspirate; Respiratory  Result Value  Ref Range Status    Specimen Description TRACHEAL ASPIRATE  Final   Special Requests NONE  Final   Gram Stain   Final    MODERATE WBC PRESENT, PREDOMINANTLY PMN MODERATE GRAM NEGATIVE RODS FEW YEAST RARE GRAM POSITIVE COCCI IN CHAINS Performed at [facility], [address] [place], [place] [address]    Culture MODERATE KLEBSIELLA PNEUMONIAE  Final   Report Status 10/08/2020 FINAL  Final   Organism ID, Bacteria KLEBSIELLA PNEUMONIAE  Final      Susceptibility   Klebsiella pneumoniae - MIC*    AMPICILLIN RESISTANT Resistant     CEFAZOLIN <=4 SENSITIVE Sensitive     CEFEPIME <=0.12 SENSITIVE Sensitive     CEFTAZIDIME <=1 SENSITIVE Sensitive     CEFTRIAXONE <=0.25 SENSITIVE Sensitive     CIPROFLOXACIN >=4 RESISTANT Resistant     GENTAMICIN <=1 SENSITIVE Sensitive     IMIPENEM <=0.25 SENSITIVE Sensitive     TRIMETH/SULFA <=20 SENSITIVE Sensitive     AMPICILLIN/SULBACTAM 8 SENSITIVE Sensitive     PIP/TAZO 16 SENSITIVE Sensitive     * MODERATE KLEBSIELLA PNEUMONIAE  Culture, blood (routine x 2)     Status: None   Collection Time: 10/08/20  4:17 PM   Specimen: BLOOD RIGHT WRIST  Result Value Ref Range Status   Specimen Description BLOOD RIGHT WRIST  Final   Special Requests   Final    BOTTLES DRAWN AEROBIC ONLY Blood Culture results may not be optimal due to an excessive volume of blood received in culture bottles   Culture   Final    NO GROWTH 5 DAYS Performed at [facility], [place] [address] [place], [place] [address]    Report Status 10/13/2020 FINAL  Final  Culture, blood (routine x 2)     Status: None   Collection Time: 10/08/20  4:17 PM   Specimen: BLOOD RIGHT HAND  Result Value Ref Range Status   Specimen Description BLOOD RIGHT HAND  Final   Special Requests   Final    BOTTLES DRAWN AEROBIC ONLY Blood Culture results may not be optimal due to an excessive volume of blood received in culture bottles   Culture   Final    NO GROWTH 5 DAYS Performed at [facility], [place] [address] [place], [place] [address]    Report Status 10/13/2020 FINAL  Final  Culture, respiratory (non-expectorated)     Status: None   Collection Time: 10/14/20  1:41 PM   Specimen: Tracheal Aspirate; Respiratory  Result Value Ref Range Status   Specimen Description TRACHEAL ASPIRATE  Final   Special Requests NONE  Final   Gram Stain   Final    RARE WBC PRESENT, PREDOMINANTLY PMN RARE GRAM NEGATIVE RODS    Culture   Final    RARE Normal respiratory flora-no Staph aureus or Pseudomonas seen Performed at [facility], [address] [place],  [address]    Report Status 10/17/2020 FINAL  Final  Culture, blood (routine x 2)     Status: Abnormal   Collection Time: 10/27/20  9:24 PM   Specimen: BLOOD  Result Value Ref Range Status   Specimen Description BLOOD RIGHT HAND  Final   Special Requests   Final    BOTTLES DRAWN AEROBIC ONLY Blood Culture adequate volume   Culture  Setup Time   Final    Organism ID to follow YEAST AEROBIC BOTTLE ONLY CRITICAL RESULT CALLED TO, READ BACK BY AND VERIFIED WITH:  RN D [place] [phone] AT 1400 BY CM    Culture (A)  Final    CANDIDA GLABRATA SEE SEPARATE REPORT FOR SUSCEPTIBILITY RESULTS Performed at [employer] Performed at [facility], [place] [address], [place], [place] [address]    Report Status 11/13/2020 FINAL  Final  Culture, blood (routine x 2)     Status: None   Collection Time: 10/27/20  9:24 PM   Specimen: BLOOD  Result Value Ref Range Status   Specimen Description BLOOD RIGHT ARM  Final   Special Requests   Final    BOTTLES DRAWN AEROBIC ONLY Blood Culture adequate volume   Culture   Final    NO GROWTH 5 DAYS Performed at [facility], [place] [address] [place], [place] [address]    Report Status 11/01/2020 FINAL  Final  Blood Culture ID Panel (Reflexed)     Status: Abnormal   Collection Time: 10/27/20  9:24 PM  Result Value Ref Range Status   Enterococcus faecalis NOT DETECTED NOT  DETECTED Final   Enterococcus Faecium NOT DETECTED NOT DETECTED Final   Listeria monocytogenes NOT DETECTED NOT DETECTED Final   Staphylococcus species NOT DETECTED NOT DETECTED Final   Staphylococcus aureus (BCID) NOT DETECTED NOT DETECTED Final   Staphylococcus epidermidis NOT DETECTED NOT DETECTED Final   Staphylococcus lugdunensis NOT DETECTED NOT DETECTED Final   Streptococcus species NOT DETECTED NOT DETECTED Final   Streptococcus agalactiae NOT DETECTED NOT DETECTED Final   Streptococcus pneumoniae NOT DETECTED NOT DETECTED Final   Streptococcus pyogenes NOT DETECTED NOT DETECTED Final   A.calcoaceticus-baumannii NOT DETECTED NOT DETECTED Final   Bacteroides fragilis NOT DETECTED NOT DETECTED Final   Enterobacterales NOT DETECTED NOT DETECTED Final   Enterobacter cloacae complex NOT DETECTED NOT DETECTED Final   Escherichia coli NOT DETECTED NOT DETECTED Final   Klebsiella aerogenes NOT DETECTED NOT DETECTED Final   Klebsiella oxytoca NOT DETECTED NOT DETECTED Final   Klebsiella pneumoniae NOT DETECTED NOT DETECTED Final   Proteus species NOT DETECTED NOT DETECTED Final   Salmonella species NOT DETECTED NOT DETECTED Final   Serratia marcescens NOT DETECTED NOT DETECTED Final   Haemophilus influenzae NOT DETECTED NOT DETECTED Final   Neisseria meningitidis NOT DETECTED NOT DETECTED Final   Pseudomonas aeruginosa NOT DETECTED NOT DETECTED Final   Stenotrophomonas maltophilia NOT DETECTED NOT DETECTED Final   Candida albicans NOT DETECTED NOT DETECTED Final   Candida auris NOT DETECTED NOT DETECTED Final   Candida glabrata DETECTED (A) NOT DETECTED Final    Comment: CRITICAL RESULT CALLED TO, READ BACK BY AND VERIFIED WITH: PHARMD M MITCHELL [id number] AT 1350 BY CM    Candida krusei NOT DETECTED NOT DETECTED Final   Candida parapsilosis NOT DETECTED NOT DETECTED Final   Candida tropicalis NOT DETECTED NOT DETECTED Final   Cryptococcus neoformans/gattii NOT DETECTED NOT DETECTED  Final    Comment: Performed at [facility], [address], [place], [place] [address]  Gram stain     Status: None   Collection Time: 10/31/20  3:16 PM   Specimen: Abdomen; Peritoneal Fluid  Result Value Ref Range Status   Specimen Description FLUID PERITONEAL ABDOMEN  Final   Special Requests NONE  Final   Gram Stain   Final    RARE WBC PRESENT,BOTH PMN AND MONONUCLEAR NO ORGANISMS SEEN Performed at [facility], [place] [address], [place], [place] [address]    Report Status 10/31/2020 FINAL  Final  Culture, body fluid-bottle  Status: None   Collection Time: 10/31/20  3:16 PM   Specimen: Fluid  Result Value Ref Range Status   Specimen Description FLUID PERITONEAL ABDOMEN  Final   Special Requests BOTTLES DRAWN AEROBIC AND ANAEROBIC  Final   Culture   Final    NO GROWTH 5 DAYS Performed at [facility], [place] [address], [place], [place] [address]    Report Status 11/05/2020 FINAL  Final  Culture, blood (routine x 2)     Status: None   Collection Time: 11/03/20  3:32 PM   Specimen: BLOOD  Result Value Ref Range Status   Specimen Description BLOOD LEFT ANTECUBITAL  Final   Special Requests   Final    BOTTLES DRAWN AEROBIC AND ANAEROBIC Blood Culture results may not be optimal due to an inadequate volume of blood received in culture bottles   Culture   Final    NO GROWTH 5 DAYS Performed at [facility], [place] [address], [place], [place] [address]    Report Status 11/08/2020 FINAL  Final  Culture, blood (routine x 2)     Status: None   Collection Time: 11/03/20  3:45 PM   Specimen: BLOOD  Result Value Ref Range Status   Specimen Description BLOOD LEFT ANTECUBITAL  Final   Special Requests   Final    BOTTLES DRAWN AEROBIC ONLY Blood Culture adequate volume   Culture   Final    NO GROWTH 5 DAYS Performed at [facility], [place] [address], [place], [place] [address]    Report Status 11/08/2020 FINAL  Final  Culture, respiratory (non-expectorated)      Status: None   Collection Time: 11/03/20  4:44 PM   Specimen: Tracheal Aspirate; Respiratory  Result Value Ref Range Status   Specimen Description TRACHEAL ASPIRATE  Final   Special Requests NONE  Final   Gram Stain   Final    FEW WBC PRESENT, PREDOMINANTLY PMN ABUNDANT GRAM NEGATIVE RODS MODERATE GRAM POSITIVE RODS FEW GRAM POSITIVE COCCI FEW YEAST Performed at [facility], [address], [place], [place] [address]    Culture ABUNDANT KLEBSIELLA PNEUMONIAE  Final   Report Status 11/05/2020 FINAL  Final   Organism ID, Bacteria KLEBSIELLA PNEUMONIAE  Final      Susceptibility   Klebsiella pneumoniae - MIC*    AMPICILLIN RESISTANT Resistant     CEFAZOLIN <=4 SENSITIVE Sensitive     CEFEPIME <=0.12 SENSITIVE Sensitive     CEFTAZIDIME <=1 SENSITIVE Sensitive     CEFTRIAXONE <=0.25 SENSITIVE Sensitive     CIPROFLOXACIN 1 SENSITIVE Sensitive     GENTAMICIN <=1 SENSITIVE Sensitive     IMIPENEM <=0.25 SENSITIVE Sensitive     TRIMETH/SULFA <=20 SENSITIVE Sensitive     AMPICILLIN/SULBACTAM 4 SENSITIVE Sensitive     PIP/TAZO <=4 SENSITIVE Sensitive     * ABUNDANT KLEBSIELLA PNEUMONIAE  Expectorated sputum assessment w rflx to resp cult     Status: None   Collection Time: 11/13/20  4:06 PM   Specimen: Expectorated Sputum  Result Value Ref Range Status   Specimen Description EXPECTORATED SPUTUM  Final   Special Requests NONE  Final   Sputum evaluation   Final    THIS SPECIMEN IS ACCEPTABLE FOR SPUTUM CULTURE Performed at [facility], [address], [place], [place] [address]    Report Status 11/14/2020 FINAL  Final  Culture, respiratory     Status: None   Collection Time: 11/13/20  4:06 PM  Result Value Ref Range Status   Specimen Description EXPECTORATED SPUTUM  Final   Special Requests NONE Reflexed from [id number]  Final   Gram Stain   Final    NO WBC SEEN FEW GRAM NEGATIVE RODS Performed at [facility], [address] [place], [place] [address]     Culture MODERATE STENOTROPHOMONAS MALTOPHILIA  Final   Report Status 11/17/2020 FINAL  Final   Organism ID, Bacteria STENOTROPHOMONAS MALTOPHILIA  Final      Susceptibility   Stenotrophomonas maltophilia - MIC*    LEVOFLOXACIN 2 SENSITIVE Sensitive     TRIMETH/SULFA <=20 SENSITIVE Sensitive     * MODERATE STENOTROPHOMONAS MALTOPHILIA    Coagulation Studies: No results for input(s): LABPROT, INR in the last 72 hours.  Urinalysis: No results for input(s): COLORURINE, LABSPEC, PHURINE, GLUCOSEU, HGBUR, BILIRUBINUR, KETONESUR, PROTEINUR, UROBILINOGEN, NITRITE, LEUKOCYTESUR in the last 72 hours.  Invalid input(s): APPERANCEUR    Imaging: No results found.   Medications:     iohexol, lidocaine  Assessment/ Plan:  54 y.o. 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   1.  ESRD on HD.  Pt seen and evaluated at bedside, due for HD today, orders prepared.   2.  Hypotension. Continue use of albumin for BP support.   3.  Anemia of chronic kidney disease.   Lab Results  Component Value Date   HGB 8.3 (L) 11/29/2020  Hgb currently 8.3 and improved, will monitor.   4.  Secondary hyperparathyroidism/hypercalcemia.  Phos 6.4 today, should come down with HD. Pt started on sensipar for hypercalcemia.   5.  Acute respiratory failure.  Breathing comfortably, doing well post extubation, decannulation.      LOS: 0 Stephens [place] 12/8/20218:50 AM

## 2020-11-30 LAB — HEPATITIS B SURFACE ANTIGEN: Hepatitis B Surface Ag: NONREACTIVE

## 2020-11-30 LAB — HEPATITIS B CORE ANTIBODY, TOTAL: Hep B Core Total Ab: REACTIVE — AB

## 2020-12-01 LAB — CBC
HCT: 28.6 % — ABNORMAL LOW (ref 39.0–52.0)
Hemoglobin: 8.3 g/dL — ABNORMAL LOW (ref 13.0–17.0)
MCH: 27.8 pg (ref 26.0–34.0)
MCHC: 29 g/dL — ABNORMAL LOW (ref 30.0–36.0)
MCV: 95.7 fL (ref 80.0–100.0)
Platelets: 827 10*3/uL — ABNORMAL HIGH (ref 150–400)
RBC: 2.99 MIL/uL — ABNORMAL LOW (ref 4.22–5.81)
RDW: 20.9 % — ABNORMAL HIGH (ref 11.5–15.5)
WBC: 15 10*3/uL — ABNORMAL HIGH (ref 4.0–10.5)
nRBC: 0.4 % — ABNORMAL HIGH (ref 0.0–0.2)

## 2020-12-01 LAB — RENAL FUNCTION PANEL
Albumin: 3 g/dL — ABNORMAL LOW (ref 3.5–5.0)
Anion gap: 15 (ref 5–15)
BUN: 63 mg/dL — ABNORMAL HIGH (ref 6–20)
CO2: 25 mmol/L (ref 22–32)
Calcium: 14.3 mg/dL (ref 8.9–10.3)
Chloride: 92 mmol/L — ABNORMAL LOW (ref 98–111)
Creatinine, Ser: 4.26 mg/dL — ABNORMAL HIGH (ref 0.61–1.24)
GFR, Estimated: 16 mL/min — ABNORMAL LOW (ref >60)
Glucose, Bld: 126 mg/dL — ABNORMAL HIGH (ref 70–99)
Phosphorus: 5.9 mg/dL — ABNORMAL HIGH (ref 2.5–4.6)
Potassium: 3.8 mmol/L (ref 3.5–5.1)
Sodium: 132 mmol/L — ABNORMAL LOW (ref 135–145)

## 2020-12-01 LAB — HEPATITIS B SURFACE ANTIBODY, QUANTITATIVE: Hep B S AB Quant (Post): 218.1 m[IU]/mL (ref >9.9)

## 2020-12-01 LAB — MAGNESIUM: Magnesium: 2.5 mg/dL — ABNORMAL HIGH (ref 1.7–2.4)

## 2020-12-01 NOTE — Progress Notes (Signed)
[facility]  ROUNDING NOTE   Subjective:  Pt seen at bedside.  Due for hemodialysis treatment today. Remains hypercalcemic with calcium of 14.3. Suspect due to immobility.   Objective:  Vital signs in last 24 hours:  Temperature 97.3 pulse 93 respirations 20 blood pressure 161/80   Physical Exam: General:  Chronically ill-appearing  Head:  Normocephalic, atraumatic. Moist oral mucosal membranes  Eyes:  Anicteric  Neck:  Supple  Lungs:   Scattered rhonchi, normal effort  Heart:  S1S2 no rubs  Abdomen:   Soft, nontender, bowel sounds present, PEG in place, colostomy  Extremities:  Right AKA, no left lower extremity edema  Neurologic:  Lethargic but arousable  Skin:  No acute rash  Access:  Left upper extremity AV fistula    Basic Metabolic Panel: Recent Labs  Lab 11/27/20 0638 11/29/20 0714 12/01/20 0432  NA 132* 131* 132*  K 3.9 4.2 3.8  CL 91* 92* 92*  CO2 $Re'25 23 25  'JAg$ GLUCOSE 134* 133* 126*  BUN 95* 67* 63*  CREATININE 5.04* 4.25* 4.26*  CALCIUM 14.2* 13.8* 14.3*  MG 2.4 2.4 2.5*  PHOS 5.4* 6.4* 5.9*    Liver Function Tests: Recent Labs  Lab 11/27/20 0638 11/29/20 0714 12/01/20 0432  ALBUMIN 2.7* 3.0* 3.0*   No results for input(s): LIPASE, AMYLASE in the last 168 hours. No results for input(s): AMMONIA in the last 168 hours.  CBC: Recent Labs  Lab 11/24/20 1239 11/27/20 0638 11/29/20 0714 12/01/20 0432  WBC 13.1* 14.7* 14.1* 15.0*  HGB 7.5* 7.9* 8.3* 8.3*  HCT 26.1* 27.4* 27.3* 28.6*  MCV 96.0 94.8 93.8 95.7  PLT 891* 887* 849* 827*    Cardiac Enzymes: No results for input(s): CKTOTAL, CKMB, CKMBINDEX, TROPONINI in the last 168 hours.  BNP: Invalid input(s): POCBNP  CBG: No results for input(s): GLUCAP in the last 168 hours.  Microbiology: Results for orders placed or performed during the hospital encounter of 09/22/20  Culture, respiratory (non-expectorated)     Status: None   Collection Time: 09/23/20 11:25 AM    Specimen: Tracheal Aspirate; Respiratory  Result Value Ref Range Status   Specimen Description TRACHEAL ASPIRATE  Final   Special Requests NONE  Final   Gram Stain   Final    RARE WBC PRESENT,BOTH PMN AND MONONUCLEAR NO ORGANISMS SEEN Performed at [facility], [address], [place], [place] [address]    Culture FEW KLEBSIELLA PNEUMONIAE  Final   Report Status 09/25/2020 FINAL  Final   Organism ID, Bacteria KLEBSIELLA PNEUMONIAE  Final      Susceptibility   Klebsiella pneumoniae - MIC*    AMPICILLIN >=32 RESISTANT Resistant     CEFAZOLIN <=4 SENSITIVE Sensitive     CEFEPIME 0.25 SENSITIVE Sensitive     CEFTAZIDIME <=1 SENSITIVE Sensitive     CEFTRIAXONE 1 SENSITIVE Sensitive     CIPROFLOXACIN <=0.25 SENSITIVE Sensitive     GENTAMICIN <=1 SENSITIVE Sensitive     IMIPENEM <=0.25 SENSITIVE Sensitive     TRIMETH/SULFA <=20 SENSITIVE Sensitive     AMPICILLIN/SULBACTAM 16 INTERMEDIATE Intermediate     PIP/TAZO 64 INTERMEDIATE Intermediate     * FEW KLEBSIELLA PNEUMONIAE  Culture, blood (routine x 2)     Status: None   Collection Time: 09/23/20  4:26 PM   Specimen: BLOOD  Result Value Ref Range Status   Specimen Description BLOOD BLOOD RIGHT HAND  Final   Special Requests   Final    AEROBIC BOTTLE ONLY Blood Culture  results may not be optimal due to an inadequate volume of blood received in culture bottles   Culture   Final    NO GROWTH 5 DAYS Performed at [facility], [place] [address] [place], [place] [address]    Report Status 09/28/2020 FINAL  Final  Culture, blood (single)     Status: None   Collection Time: 09/24/20  6:00 AM   Specimen: BLOOD RIGHT HAND  Result Value Ref Range Status   Specimen Description BLOOD RIGHT HAND  Final   Special Requests   Final    BOTTLES DRAWN AEROBIC ONLY Blood Culture results may not be optimal due to an inadequate volume of blood received in culture bottles   Culture   Final    NO GROWTH 5 DAYS Performed at [facility], [place] [address], [place], [place] [address]    Report Status 09/29/2020 FINAL  Final  Body fluid culture     Status: None   Collection Time: 09/29/20 12:27 PM   Specimen: PATH Cytology Peritoneal fluid  Result Value Ref Range Status   Specimen Description PERITONEAL FLUID  Final   Special Requests NONE  Final   Gram Stain   Final    WBC PRESENT,BOTH PMN AND MONONUCLEAR NO ORGANISMS SEEN CYTOSPIN SMEAR    Culture   Final    NO GROWTH 3 DAYS Performed at [facility], [address], [place], [place] [address]    Report Status 10/03/2020 FINAL  Final  Culture, blood (Routine X 2) w Reflex to ID Panel     Status: Abnormal   Collection Time: 10/04/20 12:50 PM   Specimen: BLOOD  Result Value Ref Range Status   Specimen Description BLOOD RIGHT ANTECUBITAL  Final   Special Requests   Final    BOTTLES DRAWN AEROBIC AND ANAEROBIC Blood Culture adequate volume   Culture  Setup Time   Final    GRAM POSITIVE COCCI IN CLUSTERS AEROBIC BOTTLE ONLY CRITICAL RESULT CALLED TO, READ BACK BY AND VERIFIED WITH: A. Grandville Silos RN 16:20 10/05/20 (wilsonm) Performed at [facility], [place] [address], [place], [place] [address]    Culture STAPHYLOCOCCUS EPIDERMIDIS (A)  Final   Report Status 10/07/2020 FINAL  Final   Organism ID, Bacteria STAPHYLOCOCCUS EPIDERMIDIS  Final      Susceptibility   Staphylococcus epidermidis - MIC*    CIPROFLOXACIN >=8 RESISTANT Resistant     ERYTHROMYCIN >=8 RESISTANT Resistant     GENTAMICIN >=16 RESISTANT Resistant     OXACILLIN >=4 RESISTANT Resistant     TETRACYCLINE 2 SENSITIVE Sensitive     VANCOMYCIN 2 SENSITIVE Sensitive     TRIMETH/SULFA 160 RESISTANT Resistant     CLINDAMYCIN >=8 RESISTANT Resistant     RIFAMPIN <=0.5 SENSITIVE Sensitive     Inducible Clindamycin NEGATIVE Sensitive     * STAPHYLOCOCCUS EPIDERMIDIS  Blood Culture ID Panel (Reflexed)     Status: Abnormal   Collection Time: 10/04/20 12:50 PM  Result Value Ref Range Status    Enterococcus faecalis NOT DETECTED NOT DETECTED Final   Enterococcus Faecium NOT DETECTED NOT DETECTED Final   Listeria monocytogenes NOT DETECTED NOT DETECTED Final   Staphylococcus species DETECTED (A) NOT DETECTED Final    Comment: CRITICAL RESULT CALLED TO, READ BACK BY AND VERIFIED WITH: A. Grandville Silos RN 16:20 10/05/20 (wilsonm)    Staphylococcus aureus (BCID) NOT DETECTED NOT DETECTED Final   Staphylococcus epidermidis DETECTED (A) NOT DETECTED Final    Comment:  Methicillin (oxacillin) resistant coagulase negative staphylococcus. Possible blood culture contaminant (unless isolated from more than one blood culture draw or clinical case suggests pathogenicity). No antibiotic treatment is indicated for blood  culture contaminants. CRITICAL RESULT CALLED TO, READ BACK BY AND VERIFIED WITH: A. Janee Morn RN 16:20 10/05/20 (wilsonm)    Staphylococcus lugdunensis NOT DETECTED NOT DETECTED Final   Streptococcus species NOT DETECTED NOT DETECTED Final   Streptococcus agalactiae NOT DETECTED NOT DETECTED Final   Streptococcus pneumoniae NOT DETECTED NOT DETECTED Final   Streptococcus pyogenes NOT DETECTED NOT DETECTED Final   A.calcoaceticus-baumannii NOT DETECTED NOT DETECTED Final   Bacteroides fragilis NOT DETECTED NOT DETECTED Final   Enterobacterales NOT DETECTED NOT DETECTED Final   Enterobacter cloacae complex NOT DETECTED NOT DETECTED Final   Escherichia coli NOT DETECTED NOT DETECTED Final   Klebsiella aerogenes NOT DETECTED NOT DETECTED Final   Klebsiella oxytoca NOT DETECTED NOT DETECTED Final   Klebsiella pneumoniae NOT DETECTED NOT DETECTED Final   Proteus species NOT DETECTED NOT DETECTED Final   Salmonella species NOT DETECTED NOT DETECTED Final   Serratia marcescens NOT DETECTED NOT DETECTED Final   Haemophilus influenzae NOT DETECTED NOT DETECTED Final   Neisseria meningitidis NOT DETECTED NOT DETECTED Final   Pseudomonas aeruginosa NOT DETECTED NOT DETECTED Final    Stenotrophomonas maltophilia NOT DETECTED NOT DETECTED Final   Candida albicans NOT DETECTED NOT DETECTED Final   Candida auris NOT DETECTED NOT DETECTED Final   Candida glabrata NOT DETECTED NOT DETECTED Final   Candida krusei NOT DETECTED NOT DETECTED Final   Candida parapsilosis NOT DETECTED NOT DETECTED Final   Candida tropicalis NOT DETECTED NOT DETECTED Final   Cryptococcus neoformans/gattii NOT DETECTED NOT DETECTED Final   Methicillin resistance mecA/C DETECTED (A) NOT DETECTED Final    Comment: CRITICAL RESULT CALLED TO, READ BACK BY AND VERIFIED WITH: A. Janee Morn RN 16:20 10/05/20 (wilsonm) Performed at [facility], [address] [place], [place] [address]   Culture, blood (Routine X 2) w Reflex to ID Panel     Status: Abnormal   Collection Time: 10/04/20 12:51 PM   Specimen: BLOOD RIGHT HAND  Result Value Ref Range Status   Specimen Description BLOOD RIGHT HAND  Final   Special Requests   Final    BOTTLES DRAWN AEROBIC AND ANAEROBIC Blood Culture adequate volume   Culture  Setup Time   Final    GRAM POSITIVE COCCI AEROBIC BOTTLE ONLY CRITICAL VALUE NOTED.  VALUE IS CONSISTENT WITH PREVIOUSLY REPORTED AND CALLED VALUE.    Culture (A)  Final    STAPHYLOCOCCUS EPIDERMIDIS SUSCEPTIBILITIES PERFORMED ON PREVIOUS CULTURE WITHIN THE LAST 5 DAYS. Performed at [facility], [address] [place], [place] [address]    Report Status 10/07/2020 FINAL  Final  C Difficile Quick Screen (NO PCR Reflex)     Status: None   Collection Time: 10/04/20  5:19 PM   Specimen: STOOL  Result Value Ref Range Status   C Diff antigen NEGATIVE NEGATIVE Final   C Diff toxin NEGATIVE NEGATIVE Final   C Diff interpretation No C. difficile detected.  Final    Comment: Performed at [facility], [address], [place], [place] [address]  Culture, respiratory (non-expectorated)     Status: None   Collection Time: 10/05/20 10:14 PM   Specimen: Tracheal Aspirate; Respiratory   Result Value Ref Range Status   Specimen Description TRACHEAL ASPIRATE  Final   Special Requests NONE  Final  Gram Stain   Final    MODERATE WBC PRESENT, PREDOMINANTLY PMN MODERATE GRAM NEGATIVE RODS FEW YEAST RARE GRAM POSITIVE COCCI IN CHAINS Performed at [facility], [place] [address] [place], [place] [address]    Culture MODERATE KLEBSIELLA PNEUMONIAE  Final   Report Status 10/08/2020 FINAL  Final   Organism ID, Bacteria KLEBSIELLA PNEUMONIAE  Final      Susceptibility   Klebsiella pneumoniae - MIC*    AMPICILLIN RESISTANT Resistant     CEFAZOLIN <=4 SENSITIVE Sensitive     CEFEPIME <=0.12 SENSITIVE Sensitive     CEFTAZIDIME <=1 SENSITIVE Sensitive     CEFTRIAXONE <=0.25 SENSITIVE Sensitive     CIPROFLOXACIN >=4 RESISTANT Resistant     GENTAMICIN <=1 SENSITIVE Sensitive     IMIPENEM <=0.25 SENSITIVE Sensitive     TRIMETH/SULFA <=20 SENSITIVE Sensitive     AMPICILLIN/SULBACTAM 8 SENSITIVE Sensitive     PIP/TAZO 16 SENSITIVE Sensitive     * MODERATE KLEBSIELLA PNEUMONIAE  Culture, blood (routine x 2)     Status: None   Collection Time: 10/08/20  4:17 PM   Specimen: BLOOD RIGHT WRIST  Result Value Ref Range Status   Specimen Description BLOOD RIGHT WRIST  Final   Special Requests   Final    BOTTLES DRAWN AEROBIC ONLY Blood Culture results may not be optimal due to an excessive volume of blood received in culture bottles   Culture   Final    NO GROWTH 5 DAYS Performed at [facility], [place] [address] [place], [place] [address]    Report Status 10/13/2020 FINAL  Final  Culture, blood (routine x 2)     Status: None   Collection Time: 10/08/20  4:17 PM   Specimen: BLOOD RIGHT HAND  Result Value Ref Range Status   Specimen Description BLOOD RIGHT HAND  Final   Special Requests   Final    BOTTLES DRAWN AEROBIC ONLY Blood Culture results may not be optimal due to an excessive volume of blood received in culture bottles   Culture   Final    NO GROWTH 5  DAYS Performed at [facility], [place] [address], [place], [place] [address]    Report Status 10/13/2020 FINAL  Final  Culture, respiratory (non-expectorated)     Status: None   Collection Time: 10/14/20  1:41 PM   Specimen: Tracheal Aspirate; Respiratory  Result Value Ref Range Status   Specimen Description TRACHEAL ASPIRATE  Final   Special Requests NONE  Final   Gram Stain   Final    RARE WBC PRESENT, PREDOMINANTLY PMN RARE GRAM NEGATIVE RODS    Culture   Final    RARE Normal respiratory flora-no Staph aureus or Pseudomonas seen Performed at [facility], [address], [place], [place] [address]    Report Status 10/17/2020 FINAL  Final  Culture, blood (routine x 2)     Status: Abnormal   Collection Time: 10/27/20  9:24 PM   Specimen: BLOOD  Result Value Ref Range Status   Specimen Description BLOOD RIGHT HAND  Final   Special Requests   Final    BOTTLES DRAWN AEROBIC ONLY Blood Culture adequate volume   Culture  Setup Time   Final    Organism ID to follow YEAST AEROBIC BOTTLE ONLY CRITICAL RESULT CALLED TO, READ BACK BY AND VERIFIED WITH: RN D [place] [id number] AT 1400 BY CM    Culture (A)  Final    CANDIDA GLABRATA SEE  SEPARATE REPORT FOR SUSCEPTIBILITY RESULTS Performed at [employer] Performed at [facility], [address] [place], [place] [address]    Report Status 11/13/2020 FINAL  Final  Culture, blood (routine x 2)     Status: None   Collection Time: 10/27/20  9:24 PM   Specimen: BLOOD  Result Value Ref Range Status   Specimen Description BLOOD RIGHT ARM  Final   Special Requests   Final    BOTTLES DRAWN AEROBIC ONLY Blood Culture adequate volume   Culture   Final    NO GROWTH 5 DAYS Performed at [facility], [place] [address], [place], [place] [address]    Report Status 11/01/2020 FINAL  Final  Blood Culture ID Panel (Reflexed)     Status: Abnormal   Collection Time: 10/27/20  9:24 PM  Result Value Ref Range Status    Enterococcus faecalis NOT DETECTED NOT DETECTED Final   Enterococcus Faecium NOT DETECTED NOT DETECTED Final   Listeria monocytogenes NOT DETECTED NOT DETECTED Final   Staphylococcus species NOT DETECTED NOT DETECTED Final   Staphylococcus aureus (BCID) NOT DETECTED NOT DETECTED Final   Staphylococcus epidermidis NOT DETECTED NOT DETECTED Final   Staphylococcus lugdunensis NOT DETECTED NOT DETECTED Final   Streptococcus species NOT DETECTED NOT DETECTED Final   Streptococcus agalactiae NOT DETECTED NOT DETECTED Final   Streptococcus pneumoniae NOT DETECTED NOT DETECTED Final   Streptococcus pyogenes NOT DETECTED NOT DETECTED Final   A.calcoaceticus-baumannii NOT DETECTED NOT DETECTED Final   Bacteroides fragilis NOT DETECTED NOT DETECTED Final   Enterobacterales NOT DETECTED NOT DETECTED Final   Enterobacter cloacae complex NOT DETECTED NOT DETECTED Final   Escherichia coli NOT DETECTED NOT DETECTED Final   Klebsiella aerogenes NOT DETECTED NOT DETECTED Final   Klebsiella oxytoca NOT DETECTED NOT DETECTED Final   Klebsiella pneumoniae NOT DETECTED NOT DETECTED Final   Proteus species NOT DETECTED NOT DETECTED Final   Salmonella species NOT DETECTED NOT DETECTED Final   Serratia marcescens NOT DETECTED NOT DETECTED Final   Haemophilus influenzae NOT DETECTED NOT DETECTED Final   Neisseria meningitidis NOT DETECTED NOT DETECTED Final   Pseudomonas aeruginosa NOT DETECTED NOT DETECTED Final   Stenotrophomonas maltophilia NOT DETECTED NOT DETECTED Final   Candida albicans NOT DETECTED NOT DETECTED Final   Candida auris NOT DETECTED NOT DETECTED Final   Candida glabrata DETECTED (A) NOT DETECTED Final    Comment: CRITICAL RESULT CALLED TO, READ BACK BY AND VERIFIED WITH: PHARMD M MITCHELL [id number] AT 1350 BY CM    Candida krusei NOT DETECTED NOT DETECTED Final   Candida parapsilosis NOT DETECTED NOT DETECTED Final   Candida tropicalis NOT DETECTED NOT DETECTED Final   Cryptococcus  neoformans/gattii NOT DETECTED NOT DETECTED Final    Comment: Performed at [facility], [address] [place], [place] [address]  Gram stain     Status: None   Collection Time: 10/31/20  3:16 PM   Specimen: Abdomen; Peritoneal Fluid  Result Value Ref Range Status   Specimen Description FLUID PERITONEAL ABDOMEN  Final   Special Requests NONE  Final   Gram Stain   Final    RARE WBC PRESENT,BOTH PMN AND MONONUCLEAR NO ORGANISMS SEEN Performed at [facility], [place] [address] [place], [place] [address]    Report Status 10/31/2020 FINAL  Final  Culture, body fluid-bottle     Status: None   Collection Time: 10/31/20  3:16 PM   Specimen: Fluid  Result Value Ref  Range Status   Specimen Description FLUID PERITONEAL ABDOMEN  Final   Special Requests BOTTLES DRAWN AEROBIC AND ANAEROBIC  Final   Culture   Final    NO GROWTH 5 DAYS Performed at [facility], [place] [address], [place], [place] [address]    Report Status 11/05/2020 FINAL  Final  Culture, blood (routine x 2)     Status: None   Collection Time: 11/03/20  3:32 PM   Specimen: BLOOD  Result Value Ref Range Status   Specimen Description BLOOD LEFT ANTECUBITAL  Final   Special Requests   Final    BOTTLES DRAWN AEROBIC AND ANAEROBIC Blood Culture results may not be optimal due to an inadequate volume of blood received in culture bottles   Culture   Final    NO GROWTH 5 DAYS Performed at [facility], [place] [address], [place], [place] [address]    Report Status 11/08/2020 FINAL  Final  Culture, blood (routine x 2)     Status: None   Collection Time: 11/03/20  3:45 PM   Specimen: BLOOD  Result Value Ref Range Status   Specimen Description BLOOD LEFT ANTECUBITAL  Final   Special Requests   Final    BOTTLES DRAWN AEROBIC ONLY Blood Culture adequate volume   Culture   Final    NO GROWTH 5 DAYS Performed at [facility], [place] [address], [place], [place] [address]    Report Status 11/08/2020 FINAL   Final  Culture, respiratory (non-expectorated)     Status: None   Collection Time: 11/03/20  4:44 PM   Specimen: Tracheal Aspirate; Respiratory  Result Value Ref Range Status   Specimen Description TRACHEAL ASPIRATE  Final   Special Requests NONE  Final   Gram Stain   Final    FEW WBC PRESENT, PREDOMINANTLY PMN ABUNDANT GRAM NEGATIVE RODS MODERATE GRAM POSITIVE RODS FEW GRAM POSITIVE COCCI FEW YEAST Performed at [facility], [address], [place], [place] [address]    Culture ABUNDANT KLEBSIELLA PNEUMONIAE  Final   Report Status 11/05/2020 FINAL  Final   Organism ID, Bacteria KLEBSIELLA PNEUMONIAE  Final      Susceptibility   Klebsiella pneumoniae - MIC*    AMPICILLIN RESISTANT Resistant     CEFAZOLIN <=4 SENSITIVE Sensitive     CEFEPIME <=0.12 SENSITIVE Sensitive     CEFTAZIDIME <=1 SENSITIVE Sensitive     CEFTRIAXONE <=0.25 SENSITIVE Sensitive     CIPROFLOXACIN 1 SENSITIVE Sensitive     GENTAMICIN <=1 SENSITIVE Sensitive     IMIPENEM <=0.25 SENSITIVE Sensitive     TRIMETH/SULFA <=20 SENSITIVE Sensitive     AMPICILLIN/SULBACTAM 4 SENSITIVE Sensitive     PIP/TAZO <=4 SENSITIVE Sensitive     * ABUNDANT KLEBSIELLA PNEUMONIAE  Expectorated sputum assessment w rflx to resp cult     Status: None   Collection Time: 11/13/20  4:06 PM   Specimen: Expectorated Sputum  Result Value Ref Range Status   Specimen Description EXPECTORATED SPUTUM  Final   Special Requests NONE  Final   Sputum evaluation   Final    THIS SPECIMEN IS ACCEPTABLE FOR SPUTUM CULTURE Performed at [facility], [address] [place], [place] [address]    Report Status 11/14/2020 FINAL  Final  Culture, respiratory     Status: None   Collection Time: 11/13/20  4:06 PM  Result Value Ref Range Status   Specimen Description EXPECTORATED SPUTUM  Final   Special Requests NONE  Reflexed from [id number]  Final   Gram Stain   Final    NO WBC SEEN FEW GRAM NEGATIVE RODS Performed at [place], [place] [address], [place], [place] [address]    Culture MODERATE STENOTROPHOMONAS MALTOPHILIA  Final   Report Status 11/17/2020 FINAL  Final   Organism ID, Bacteria STENOTROPHOMONAS MALTOPHILIA  Final      Susceptibility   Stenotrophomonas maltophilia - MIC*    LEVOFLOXACIN 2 SENSITIVE Sensitive     TRIMETH/SULFA <=20 SENSITIVE Sensitive     * MODERATE STENOTROPHOMONAS MALTOPHILIA    Coagulation Studies: No results for input(s): LABPROT, INR in the last 72 hours.  Urinalysis: No results for input(s): COLORURINE, LABSPEC, PHURINE, GLUCOSEU, HGBUR, BILIRUBINUR, KETONESUR, PROTEINUR, UROBILINOGEN, NITRITE, LEUKOCYTESUR in the last 72 hours.  Invalid input(s): APPERANCEUR    Imaging: No results found.   Medications:     iohexol, lidocaine  Assessment/ Plan:  54 y.o. 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   1.  ESRD on HD.  Patient due for hemodialysis treatment today.  Orders have been prepared.  2.  Hypotension. Continue use of albumin for BP support.   3.  Anemia of chronic kidney disease.   Lab Results  Component Value Date   HGB 8.3 (L) 12/01/2020  Hemoglobin stable at 8.3.  4.  Secondary hyperparathyroidism/hypercalcemia.  Phosphorus currently 5.9 and calcium quite high at 14.3.  Suspect calcium high due to immobility.  Increase Sensipar to 60 mg daily.  5.  Acute respiratory failure.  Continues to have a stable respiratory status.  Has done well post extubation and decannulation.    LOS: 0 Kanaan Kagawa 12/10/20218:33 AM

## 2020-12-01 NOTE — Progress Notes (Signed)
Dr. Anthonette Legato, Nephrology called and notified that the patient's HD tx has been moved to 12/02/20 in the morning d/t high pt volume.

## 2020-12-03 LAB — SARS CORONAVIRUS 2 (TAT 6-24 HRS): SARS Coronavirus 2: NEGATIVE

## 2020-12-04 LAB — CBC
HCT: 27.3 % — ABNORMAL LOW (ref 39.0–52.0)
Hemoglobin: 7.8 g/dL — ABNORMAL LOW (ref 13.0–17.0)
MCH: 27.7 pg (ref 26.0–34.0)
MCHC: 28.6 g/dL — ABNORMAL LOW (ref 30.0–36.0)
MCV: 96.8 fL (ref 80.0–100.0)
Platelets: 827 10*3/uL — ABNORMAL HIGH (ref 150–400)
RBC: 2.82 MIL/uL — ABNORMAL LOW (ref 4.22–5.81)
RDW: 20 % — ABNORMAL HIGH (ref 11.5–15.5)
WBC: 15.9 10*3/uL — ABNORMAL HIGH (ref 4.0–10.5)
nRBC: 0.3 % — ABNORMAL HIGH (ref 0.0–0.2)

## 2020-12-04 LAB — RENAL FUNCTION PANEL
Albumin: 2.9 g/dL — ABNORMAL LOW (ref 3.5–5.0)
Anion gap: 16 — ABNORMAL HIGH (ref 5–15)
BUN: 70 mg/dL — ABNORMAL HIGH (ref 6–20)
CO2: 24 mmol/L (ref 22–32)
Calcium: 13.8 mg/dL (ref 8.9–10.3)
Chloride: 90 mmol/L — ABNORMAL LOW (ref 98–111)
Creatinine, Ser: 4.4 mg/dL — ABNORMAL HIGH (ref 0.61–1.24)
GFR, Estimated: 15 mL/min — ABNORMAL LOW (ref >60)
Glucose, Bld: 130 mg/dL — ABNORMAL HIGH (ref 70–99)
Phosphorus: 6.6 mg/dL — ABNORMAL HIGH (ref 2.5–4.6)
Potassium: 3.9 mmol/L (ref 3.5–5.1)
Sodium: 130 mmol/L — ABNORMAL LOW (ref 135–145)

## 2020-12-04 LAB — MAGNESIUM: Magnesium: 2.4 mg/dL (ref 1.7–2.4)

## 2020-12-04 NOTE — Progress Notes (Signed)
[facility]  ROUNDING NOTE   Subjective:  Patient seen and evaluated during dialysis treatment today. Tolerating well. Blood calcium currently 13.8. Phosphorus 6.6.   Objective:  Vital signs in last 24 hours:  Temperature 96.9 pulse 90 respirations 27 blood pressure 123/75   Physical Exam: General:  Chronically ill-appearing  Head:  Normocephalic, atraumatic. Moist oral mucosal membranes  Eyes:  Anicteric  Neck:  Supple  Lungs:   Scattered rhonchi, normal effort  Heart:  S1S2 no rubs  Abdomen:   Soft, nontender, bowel sounds present, PEG in place, colostomy  Extremities:  Right AKA, no left lower extremity edema  Neurologic:  Lethargic but arousable  Skin:  No acute rash  Access:  Left upper extremity AV fistula    Basic Metabolic Panel: Recent Labs  Lab 11/29/20 0714 12/01/20 0432 12/04/20 0718  NA 131* 132* 130*  K 4.2 3.8 3.9  CL 92* 92* 90*  CO2 $Re'23 25 24  'Ura$ GLUCOSE 133* 126* 130*  BUN 67* 63* 70*  CREATININE 4.25* 4.26* 4.40*  CALCIUM 13.8* 14.3* 13.8*  MG 2.4 2.5* 2.4  PHOS 6.4* 5.9* 6.6*    Liver Function Tests: Recent Labs  Lab 11/29/20 0714 12/01/20 0432 12/04/20 0718  ALBUMIN 3.0* 3.0* 2.9*   No results for input(s): LIPASE, AMYLASE in the last 168 hours. No results for input(s): AMMONIA in the last 168 hours.  CBC: Recent Labs  Lab 11/29/20 0714 12/01/20 0432 12/04/20 0718  WBC 14.1* 15.0* 15.9*  HGB 8.3* 8.3* 7.8*  HCT 27.3* 28.6* 27.3*  MCV 93.8 95.7 96.8  PLT 849* 827* 827*    Cardiac Enzymes: No results for input(s): CKTOTAL, CKMB, CKMBINDEX, TROPONINI in the last 168 hours.  BNP: Invalid input(s): POCBNP  CBG: No results for input(s): GLUCAP in the last 168 hours.  Microbiology: Results for orders placed or performed during the hospital encounter of 09/22/20  Culture, respiratory (non-expectorated)     Status: None   Collection Time: 09/23/20 11:25 AM   Specimen: Tracheal Aspirate; Respiratory  Result  Value Ref Range Status   Specimen Description TRACHEAL ASPIRATE  Final   Special Requests NONE  Final   Gram Stain   Final    RARE WBC PRESENT,BOTH PMN AND MONONUCLEAR NO ORGANISMS SEEN Performed at [facility], [address], [place], [place] [address]    Culture FEW KLEBSIELLA PNEUMONIAE  Final   Report Status 09/25/2020 FINAL  Final   Organism ID, Bacteria KLEBSIELLA PNEUMONIAE  Final      Susceptibility   Klebsiella pneumoniae - MIC*    AMPICILLIN >=32 RESISTANT Resistant     CEFAZOLIN <=4 SENSITIVE Sensitive     CEFEPIME 0.25 SENSITIVE Sensitive     CEFTAZIDIME <=1 SENSITIVE Sensitive     CEFTRIAXONE 1 SENSITIVE Sensitive     CIPROFLOXACIN <=0.25 SENSITIVE Sensitive     GENTAMICIN <=1 SENSITIVE Sensitive     IMIPENEM <=0.25 SENSITIVE Sensitive     TRIMETH/SULFA <=20 SENSITIVE Sensitive     AMPICILLIN/SULBACTAM 16 INTERMEDIATE Intermediate     PIP/TAZO 64 INTERMEDIATE Intermediate     * FEW KLEBSIELLA PNEUMONIAE  Culture, blood (routine x 2)     Status: None   Collection Time: 09/23/20  4:26 PM   Specimen: BLOOD  Result Value Ref Range Status   Specimen Description BLOOD BLOOD RIGHT HAND  Final   Special Requests   Final    AEROBIC BOTTLE ONLY Blood Culture results may not be optimal due to an inadequate volume of  blood received in culture bottles   Culture   Final    NO GROWTH 5 DAYS Performed at [facility], [place] [address], [place], [place] [address]    Report Status 09/28/2020 FINAL  Final  Culture, blood (single)     Status: None   Collection Time: 09/24/20  6:00 AM   Specimen: BLOOD RIGHT HAND  Result Value Ref Range Status   Specimen Description BLOOD RIGHT HAND  Final   Special Requests   Final    BOTTLES DRAWN AEROBIC ONLY Blood Culture results may not be optimal due to an inadequate volume of blood received in culture bottles   Culture   Final    NO GROWTH 5 DAYS Performed at [facility], [place] [address] [place], [place] [address]     Report Status 09/29/2020 FINAL  Final  Body fluid culture     Status: None   Collection Time: 09/29/20 12:27 PM   Specimen: PATH Cytology Peritoneal fluid  Result Value Ref Range Status   Specimen Description PERITONEAL FLUID  Final   Special Requests NONE  Final   Gram Stain   Final    WBC PRESENT,BOTH PMN AND MONONUCLEAR NO ORGANISMS SEEN CYTOSPIN SMEAR    Culture   Final    NO GROWTH 3 DAYS Performed at [facility], [address] [place], [place] [address]    Report Status 10/03/2020 FINAL  Final  Culture, blood (Routine X 2) w Reflex to ID Panel     Status: Abnormal   Collection Time: 10/04/20 12:50 PM   Specimen: BLOOD  Result Value Ref Range Status   Specimen Description BLOOD RIGHT ANTECUBITAL  Final   Special Requests   Final    BOTTLES DRAWN AEROBIC AND ANAEROBIC Blood Culture adequate volume   Culture  Setup Time   Final    GRAM POSITIVE COCCI IN CLUSTERS AEROBIC BOTTLE ONLY CRITICAL RESULT CALLED TO, READ BACK BY AND VERIFIED WITH: A. Grandville Silos RN 16:20 10/05/20 ([email or web address]) Performed at [facility], [place] [address] [place], [place] [address]    Culture STAPHYLOCOCCUS EPIDERMIDIS (A)  Final   Report Status 10/07/2020 FINAL  Final   Organism ID, Bacteria STAPHYLOCOCCUS EPIDERMIDIS  Final      Susceptibility   Staphylococcus epidermidis - MIC*    CIPROFLOXACIN >=8 RESISTANT Resistant     ERYTHROMYCIN >=8 RESISTANT Resistant     GENTAMICIN >=16 RESISTANT Resistant     OXACILLIN >=4 RESISTANT Resistant     TETRACYCLINE 2 SENSITIVE Sensitive     VANCOMYCIN 2 SENSITIVE Sensitive     TRIMETH/SULFA 160 RESISTANT Resistant     CLINDAMYCIN >=8 RESISTANT Resistant     RIFAMPIN <=0.5 SENSITIVE Sensitive     Inducible Clindamycin NEGATIVE Sensitive     * STAPHYLOCOCCUS EPIDERMIDIS  Blood Culture ID Panel (Reflexed)     Status: Abnormal   Collection Time: 10/04/20 12:50 PM  Result Value Ref Range Status   Enterococcus faecalis NOT DETECTED NOT DETECTED Final    Enterococcus Faecium NOT DETECTED NOT DETECTED Final   Listeria monocytogenes NOT DETECTED NOT DETECTED Final   Staphylococcus species DETECTED (A) NOT DETECTED Final    Comment: CRITICAL RESULT CALLED TO, READ BACK BY AND VERIFIED WITH: A. Grandville Silos RN 16:20 10/05/20 ([email or web address])    Staphylococcus aureus (BCID) NOT DETECTED NOT DETECTED Final   Staphylococcus epidermidis DETECTED (A) NOT DETECTED Final    Comment: Methicillin (oxacillin) resistant coagulase negative staphylococcus. Possible blood culture contaminant (unless  isolated from more than one blood culture draw or clinical case suggests pathogenicity). No antibiotic treatment is indicated for blood  culture contaminants. CRITICAL RESULT CALLED TO, READ BACK BY AND VERIFIED WITH: A. Grandville Silos RN 16:20 10/05/20 ([email or web address])    Staphylococcus lugdunensis NOT DETECTED NOT DETECTED Final   Streptococcus species NOT DETECTED NOT DETECTED Final   Streptococcus agalactiae NOT DETECTED NOT DETECTED Final   Streptococcus pneumoniae NOT DETECTED NOT DETECTED Final   Streptococcus pyogenes NOT DETECTED NOT DETECTED Final   A.calcoaceticus-baumannii NOT DETECTED NOT DETECTED Final   Bacteroides fragilis NOT DETECTED NOT DETECTED Final   Enterobacterales NOT DETECTED NOT DETECTED Final   Enterobacter cloacae complex NOT DETECTED NOT DETECTED Final   Escherichia coli NOT DETECTED NOT DETECTED Final   Klebsiella aerogenes NOT DETECTED NOT DETECTED Final   Klebsiella oxytoca NOT DETECTED NOT DETECTED Final   Klebsiella pneumoniae NOT DETECTED NOT DETECTED Final   Proteus species NOT DETECTED NOT DETECTED Final   Salmonella species NOT DETECTED NOT DETECTED Final   Serratia marcescens NOT DETECTED NOT DETECTED Final   Haemophilus influenzae NOT DETECTED NOT DETECTED Final   Neisseria meningitidis NOT DETECTED NOT DETECTED Final   Pseudomonas aeruginosa NOT DETECTED NOT DETECTED Final   Stenotrophomonas maltophilia NOT DETECTED NOT DETECTED Final    Candida albicans NOT DETECTED NOT DETECTED Final   Candida auris NOT DETECTED NOT DETECTED Final   Candida glabrata NOT DETECTED NOT DETECTED Final   Candida krusei NOT DETECTED NOT DETECTED Final   Candida parapsilosis NOT DETECTED NOT DETECTED Final   Candida tropicalis NOT DETECTED NOT DETECTED Final   Cryptococcus neoformans/gattii NOT DETECTED NOT DETECTED Final   Methicillin resistance mecA/C DETECTED (A) NOT DETECTED Final    Comment: CRITICAL RESULT CALLED TO, READ BACK BY AND VERIFIED WITH: A. Grandville Silos RN 16:20 10/05/20 ([email or web address]) Performed at [facility], [place] [address], [place], [place] [address]   Culture, blood (Routine X 2) w Reflex to ID Panel     Status: Abnormal   Collection Time: 10/04/20 12:51 PM   Specimen: BLOOD RIGHT HAND  Result Value Ref Range Status   Specimen Description BLOOD RIGHT HAND  Final   Special Requests   Final    BOTTLES DRAWN AEROBIC AND ANAEROBIC Blood Culture adequate volume   Culture  Setup Time   Final    GRAM POSITIVE COCCI AEROBIC BOTTLE ONLY CRITICAL VALUE NOTED.  VALUE IS CONSISTENT WITH PREVIOUSLY REPORTED AND CALLED VALUE.    Culture (A)  Final    STAPHYLOCOCCUS EPIDERMIDIS SUSCEPTIBILITIES PERFORMED ON PREVIOUS CULTURE WITHIN THE LAST 5 DAYS. Performed at [facility], [place] [address] [place], [place] [address]    Report Status 10/07/2020 FINAL  Final  C Difficile Quick Screen (NO PCR Reflex)     Status: None   Collection Time: 10/04/20  5:19 PM   Specimen: STOOL  Result Value Ref Range Status   C Diff antigen NEGATIVE NEGATIVE Final   C Diff toxin NEGATIVE NEGATIVE Final   C Diff interpretation No C. difficile detected.  Final    Comment: Performed at [facility], [place] [address] [place], [place] [address]  Culture, respiratory (non-expectorated)     Status: None   Collection Time: 10/05/20 10:14 PM   Specimen: Tracheal Aspirate; Respiratory  Result Value Ref Range Status   Specimen Description TRACHEAL  ASPIRATE  Final   Special Requests NONE  Final   Gram Stain   Final    MODERATE WBC PRESENT,  PREDOMINANTLY PMN MODERATE GRAM NEGATIVE RODS FEW YEAST RARE GRAM POSITIVE COCCI IN CHAINS Performed at [facility], [place] [address], [place], [place] [address]    Culture MODERATE KLEBSIELLA PNEUMONIAE  Final   Report Status 10/08/2020 FINAL  Final   Organism ID, Bacteria KLEBSIELLA PNEUMONIAE  Final      Susceptibility   Klebsiella pneumoniae - MIC*    AMPICILLIN RESISTANT Resistant     CEFAZOLIN <=4 SENSITIVE Sensitive     CEFEPIME <=0.12 SENSITIVE Sensitive     CEFTAZIDIME <=1 SENSITIVE Sensitive     CEFTRIAXONE <=0.25 SENSITIVE Sensitive     CIPROFLOXACIN >=4 RESISTANT Resistant     GENTAMICIN <=1 SENSITIVE Sensitive     IMIPENEM <=0.25 SENSITIVE Sensitive     TRIMETH/SULFA <=20 SENSITIVE Sensitive     AMPICILLIN/SULBACTAM 8 SENSITIVE Sensitive     PIP/TAZO 16 SENSITIVE Sensitive     * MODERATE KLEBSIELLA PNEUMONIAE  Culture, blood (routine x 2)     Status: None   Collection Time: 10/08/20  4:17 PM   Specimen: BLOOD RIGHT WRIST  Result Value Ref Range Status   Specimen Description BLOOD RIGHT WRIST  Final   Special Requests   Final    BOTTLES DRAWN AEROBIC ONLY Blood Culture results may not be optimal due to an excessive volume of blood received in culture bottles   Culture   Final    NO GROWTH 5 DAYS Performed at [facility], [place] [address], [place], [place] [address]    Report Status 10/13/2020 FINAL  Final  Culture, blood (routine x 2)     Status: None   Collection Time: 10/08/20  4:17 PM   Specimen: BLOOD RIGHT HAND  Result Value Ref Range Status   Specimen Description BLOOD RIGHT HAND  Final   Special Requests   Final    BOTTLES DRAWN AEROBIC ONLY Blood Culture results may not be optimal due to an excessive volume of blood received in culture bottles   Culture   Final    NO GROWTH 5 DAYS Performed at [facility], [place] [address] [place], [place]  [address]    Report Status 10/13/2020 FINAL  Final  Culture, respiratory (non-expectorated)     Status: None   Collection Time: 10/14/20  1:41 PM   Specimen: Tracheal Aspirate; Respiratory  Result Value Ref Range Status   Specimen Description TRACHEAL ASPIRATE  Final   Special Requests NONE  Final   Gram Stain   Final    RARE WBC PRESENT, PREDOMINANTLY PMN RARE GRAM NEGATIVE RODS    Culture   Final    RARE Normal respiratory flora-no Staph aureus or Pseudomonas seen Performed at [facility], [address] [place], [place] [address]    Report Status 10/17/2020 FINAL  Final  Culture, blood (routine x 2)     Status: Abnormal   Collection Time: 10/27/20  9:24 PM   Specimen: BLOOD  Result Value Ref Range Status   Specimen Description BLOOD RIGHT HAND  Final   Special Requests   Final    BOTTLES DRAWN AEROBIC ONLY Blood Culture adequate volume   Culture  Setup Time   Final    Organism ID to follow YEAST AEROBIC BOTTLE ONLY CRITICAL RESULT CALLED TO, READ BACK BY AND VERIFIED WITH: RN D [place] [id number] AT 1400 BY CM    Culture (A)  Final    CANDIDA GLABRATA SEE SEPARATE REPORT FOR SUSCEPTIBILITY RESULTS Performed at [employer] Performed at  [facility], [place] [address], [place], [place] [address]    Report Status 11/13/2020 FINAL  Final  Culture, blood (routine x 2)     Status: None   Collection Time: 10/27/20  9:24 PM   Specimen: BLOOD  Result Value Ref Range Status   Specimen Description BLOOD RIGHT ARM  Final   Special Requests   Final    BOTTLES DRAWN AEROBIC ONLY Blood Culture adequate volume   Culture   Final    NO GROWTH 5 DAYS Performed at [facility], [place] [address], [place], [place] [address]    Report Status 11/01/2020 FINAL  Final  Blood Culture ID Panel (Reflexed)     Status: Abnormal   Collection Time: 10/27/20  9:24 PM  Result Value Ref Range Status   Enterococcus faecalis NOT DETECTED NOT DETECTED Final   Enterococcus Faecium  NOT DETECTED NOT DETECTED Final   Listeria monocytogenes NOT DETECTED NOT DETECTED Final   Staphylococcus species NOT DETECTED NOT DETECTED Final   Staphylococcus aureus (BCID) NOT DETECTED NOT DETECTED Final   Staphylococcus epidermidis NOT DETECTED NOT DETECTED Final   Staphylococcus lugdunensis NOT DETECTED NOT DETECTED Final   Streptococcus species NOT DETECTED NOT DETECTED Final   Streptococcus agalactiae NOT DETECTED NOT DETECTED Final   Streptococcus pneumoniae NOT DETECTED NOT DETECTED Final   Streptococcus pyogenes NOT DETECTED NOT DETECTED Final   A.calcoaceticus-baumannii NOT DETECTED NOT DETECTED Final   Bacteroides fragilis NOT DETECTED NOT DETECTED Final   Enterobacterales NOT DETECTED NOT DETECTED Final   Enterobacter cloacae complex NOT DETECTED NOT DETECTED Final   Escherichia coli NOT DETECTED NOT DETECTED Final   Klebsiella aerogenes NOT DETECTED NOT DETECTED Final   Klebsiella oxytoca NOT DETECTED NOT DETECTED Final   Klebsiella pneumoniae NOT DETECTED NOT DETECTED Final   Proteus species NOT DETECTED NOT DETECTED Final   Salmonella species NOT DETECTED NOT DETECTED Final   Serratia marcescens NOT DETECTED NOT DETECTED Final   Haemophilus influenzae NOT DETECTED NOT DETECTED Final   Neisseria meningitidis NOT DETECTED NOT DETECTED Final   Pseudomonas aeruginosa NOT DETECTED NOT DETECTED Final   Stenotrophomonas maltophilia NOT DETECTED NOT DETECTED Final   Candida albicans NOT DETECTED NOT DETECTED Final   Candida auris NOT DETECTED NOT DETECTED Final   Candida glabrata DETECTED (A) NOT DETECTED Final    Comment: CRITICAL RESULT CALLED TO, READ BACK BY AND VERIFIED WITH: PHARMD M MITCHELL [id number] AT 1350 BY CM    Candida krusei NOT DETECTED NOT DETECTED Final   Candida parapsilosis NOT DETECTED NOT DETECTED Final   Candida tropicalis NOT DETECTED NOT DETECTED Final   Cryptococcus neoformans/gattii NOT DETECTED NOT DETECTED Final    Comment: Performed at [facility], [address], [place], [place] [address]  Gram stain     Status: None   Collection Time: 10/31/20  3:16 PM   Specimen: Abdomen; Peritoneal Fluid  Result Value Ref Range Status   Specimen Description FLUID PERITONEAL ABDOMEN  Final   Special Requests NONE  Final   Gram Stain   Final    RARE WBC PRESENT,BOTH PMN AND MONONUCLEAR NO ORGANISMS SEEN Performed at [facility], [place] [address], [place], [place] [address]    Report Status 10/31/2020 FINAL  Final  Culture, body fluid-bottle     Status: None   Collection Time: 10/31/20  3:16 PM   Specimen: Fluid  Result Value Ref Range Status   Specimen Description FLUID PERITONEAL ABDOMEN  Final  Special Requests BOTTLES DRAWN AEROBIC AND ANAEROBIC  Final   Culture   Final    NO GROWTH 5 DAYS Performed at [facility], [place] [address] [place], [place] [address]    Report Status 11/05/2020 FINAL  Final  Culture, blood (routine x 2)     Status: None   Collection Time: 11/03/20  3:32 PM   Specimen: BLOOD  Result Value Ref Range Status   Specimen Description BLOOD LEFT ANTECUBITAL  Final   Special Requests   Final    BOTTLES DRAWN AEROBIC AND ANAEROBIC Blood Culture results may not be optimal due to an inadequate volume of blood received in culture bottles   Culture   Final    NO GROWTH 5 DAYS Performed at [facility], [place] [address] [place], [place] [address]    Report Status 11/08/2020 FINAL  Final  Culture, blood (routine x 2)     Status: None   Collection Time: 11/03/20  3:45 PM   Specimen: BLOOD  Result Value Ref Range Status   Specimen Description BLOOD LEFT ANTECUBITAL  Final   Special Requests   Final    BOTTLES DRAWN AEROBIC ONLY Blood Culture adequate volume   Culture   Final    NO GROWTH 5 DAYS Performed at [facility], [place] [address], [place], [place] [address]    Report Status 11/08/2020 FINAL  Final  Culture, respiratory (non-expectorated)     Status: None   Collection Time:  11/03/20  4:44 PM   Specimen: Tracheal Aspirate; Respiratory  Result Value Ref Range Status   Specimen Description TRACHEAL ASPIRATE  Final   Special Requests NONE  Final   Gram Stain   Final    FEW WBC PRESENT, PREDOMINANTLY PMN ABUNDANT GRAM NEGATIVE RODS MODERATE GRAM POSITIVE RODS FEW GRAM POSITIVE COCCI FEW YEAST Performed at [facility], [address], [place], [place] [address]    Culture ABUNDANT KLEBSIELLA PNEUMONIAE  Final   Report Status 11/05/2020 FINAL  Final   Organism ID, Bacteria KLEBSIELLA PNEUMONIAE  Final      Susceptibility   Klebsiella pneumoniae - MIC*    AMPICILLIN RESISTANT Resistant     CEFAZOLIN <=4 SENSITIVE Sensitive     CEFEPIME <=0.12 SENSITIVE Sensitive     CEFTAZIDIME <=1 SENSITIVE Sensitive     CEFTRIAXONE <=0.25 SENSITIVE Sensitive     CIPROFLOXACIN 1 SENSITIVE Sensitive     GENTAMICIN <=1 SENSITIVE Sensitive     IMIPENEM <=0.25 SENSITIVE Sensitive     TRIMETH/SULFA <=20 SENSITIVE Sensitive     AMPICILLIN/SULBACTAM 4 SENSITIVE Sensitive     PIP/TAZO <=4 SENSITIVE Sensitive     * ABUNDANT KLEBSIELLA PNEUMONIAE  Expectorated sputum assessment w rflx to resp cult     Status: None   Collection Time: 11/13/20  4:06 PM   Specimen: Expectorated Sputum  Result Value Ref Range Status   Specimen Description EXPECTORATED SPUTUM  Final   Special Requests NONE  Final   Sputum evaluation   Final    THIS SPECIMEN IS ACCEPTABLE FOR SPUTUM CULTURE Performed at [facility], [address], [place], [place] [address]    Report Status 11/14/2020 FINAL  Final  Culture, respiratory     Status: None   Collection Time: 11/13/20  4:06 PM  Result Value Ref Range Status   Specimen Description EXPECTORATED SPUTUM  Final   Special Requests NONE Reflexed from [id number]  Final   Gram Stain   Final  NO WBC SEEN FEW GRAM NEGATIVE RODS Performed at [facility], [place] [address] [place], [place] [address]    Culture MODERATE STENOTROPHOMONAS  MALTOPHILIA  Final   Report Status 11/17/2020 FINAL  Final   Organism ID, Bacteria STENOTROPHOMONAS MALTOPHILIA  Final      Susceptibility   Stenotrophomonas maltophilia - MIC*    LEVOFLOXACIN 2 SENSITIVE Sensitive     TRIMETH/SULFA <=20 SENSITIVE Sensitive     * MODERATE STENOTROPHOMONAS MALTOPHILIA  SARS CORONAVIRUS 2 (TAT 6-24 HRS) Nasopharyngeal Nasopharyngeal Swab     Status: None   Collection Time: 12/03/20 10:42 AM   Specimen: Nasopharyngeal Swab  Result Value Ref Range Status   SARS Coronavirus 2 NEGATIVE NEGATIVE Final    Comment: (NOTE) SARS-CoV-2 target nucleic acids are NOT DETECTED.  The SARS-CoV-2 RNA is generally detectable in upper and lower respiratory specimens during the acute phase of infection. Negative results do not preclude SARS-CoV-2 infection, do not rule out co-infections with other pathogens, and should not be used as the sole basis for treatment or other patient management decisions. Negative results must be combined with clinical observations, patient history, and epidemiological information. The expected result is Negative.  Fact Sheet for Patients: SugarRoll.be  Fact Sheet for Healthcare Providers: https://www.woods-mathews.com/  This test is not yet approved or cleared by the [place] FDA and  has been authorized for detection and/or diagnosis of SARS-CoV-2 by FDA under an Emergency Use Authorization (EUA). This EUA will remain  in effect (meaning this test can be used) for the duration of the COVID-19 declaration under Se ction 564(b)(1) of the Act, 21 U.S.C. section 360bbb-3(b)(1), unless the authorization is terminated or revoked sooner.  Performed at [facility], [place] [address] [place], [place] [address]     Coagulation Studies: No results for input(s): LABPROT, INR in the last 72 hours.  Urinalysis: No results for input(s): COLORURINE, LABSPEC, PHURINE, GLUCOSEU, HGBUR,  BILIRUBINUR, KETONESUR, PROTEINUR, UROBILINOGEN, NITRITE, LEUKOCYTESUR in the last 72 hours.  Invalid input(s): APPERANCEUR    Imaging: No results found.   Medications:     iohexol, lidocaine  Assessment/ Plan:  54 y.o. 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   1.  ESRD on HD.  Patient seen and evaluated during dialysis treatment today.  Tolerating well.  Plan to complete dialysis treatment today.  2.  Hypotension. Continue use of albumin for BP support.   3.  Anemia of chronic kidney disease.   Lab Results  Component Value Date   HGB 7.8 (L) 12/04/2020  Hemoglobin dropped a bit to 7.8.  Could be delusional.  Reassess after ultrafiltration today.  4.  Secondary hyperparathyroidism/hypercalcemia.  Calcium down a bit to 13.8.  Suspect due to hypercalcemia and secondary to bone breakdown from immobility.  Remains on Sensipar.  5.  Acute respiratory failure.  Patient has done well status post extubation and decannulation.    LOS: 0 Osias Resnick 12/13/20218:07 AM
